# Patient Record
Sex: Male | Born: 1980
Health system: Southern US, Community
[De-identification: ages and names within clinical notes are randomized; demographics above are authoritative.]

## PROBLEM LIST (undated history)

## (undated) DIAGNOSIS — I469 Cardiac arrest, cause unspecified: Secondary | ICD-10-CM

## (undated) DIAGNOSIS — F319 Bipolar disorder, unspecified: Secondary | ICD-10-CM

## (undated) DIAGNOSIS — F32A Depression, unspecified: Secondary | ICD-10-CM

## (undated) DIAGNOSIS — R569 Unspecified convulsions: Secondary | ICD-10-CM

## (undated) DIAGNOSIS — F329 Major depressive disorder, single episode, unspecified: Secondary | ICD-10-CM

## (undated) DIAGNOSIS — G4730 Sleep apnea, unspecified: Secondary | ICD-10-CM

## (undated) DIAGNOSIS — I10 Essential (primary) hypertension: Secondary | ICD-10-CM

## (undated) DIAGNOSIS — F419 Anxiety disorder, unspecified: Secondary | ICD-10-CM

## (undated) DIAGNOSIS — E559 Vitamin D deficiency, unspecified: Secondary | ICD-10-CM

## (undated) DIAGNOSIS — M352 Behcet's disease: Secondary | ICD-10-CM

## (undated) HISTORY — DX: Behcet's disease: M35.2

## (undated) HISTORY — PX: FRACTURE SURGERY: SHX138

## (undated) HISTORY — DX: Bipolar disorder, unspecified: F31.9

## (undated) HISTORY — PX: APPENDECTOMY: SHX54

## (undated) HISTORY — PX: FETAL SURGERY FOR CONGENITAL HERNIA: SHX1618

## (undated) HISTORY — PX: NISSEN FUNDOPLICATION: SHX2091

## (undated) HISTORY — PX: OTHER SURGICAL HISTORY: SHX169

## (undated) HISTORY — DX: Vitamin D deficiency, unspecified: E55.9

---
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
## 1998-05-23 ENCOUNTER — Emergency Department (HOSPITAL_COMMUNITY): Admission: EM | Admit: 1998-05-23 | Discharge: 1998-05-23 | Payer: Self-pay | Admitting: Emergency Medicine

## 1998-07-25 ENCOUNTER — Encounter: Payer: Self-pay | Admitting: Emergency Medicine

## 1998-07-25 ENCOUNTER — Emergency Department (HOSPITAL_COMMUNITY): Admission: EM | Admit: 1998-07-25 | Discharge: 1998-07-25 | Payer: Self-pay | Admitting: Emergency Medicine

## 1998-12-15 ENCOUNTER — Encounter: Payer: Self-pay | Admitting: Emergency Medicine

## 1998-12-15 ENCOUNTER — Emergency Department (HOSPITAL_COMMUNITY): Admission: EM | Admit: 1998-12-15 | Discharge: 1998-12-15 | Payer: Self-pay | Admitting: Emergency Medicine

## 1999-09-19 ENCOUNTER — Emergency Department (HOSPITAL_COMMUNITY): Admission: EM | Admit: 1999-09-19 | Discharge: 1999-09-19 | Payer: Self-pay

## 1999-11-29 ENCOUNTER — Emergency Department (HOSPITAL_COMMUNITY): Admission: EM | Admit: 1999-11-29 | Discharge: 1999-11-29 | Payer: Self-pay | Admitting: Emergency Medicine

## 1999-11-29 ENCOUNTER — Encounter: Payer: Self-pay | Admitting: Emergency Medicine

## 2000-01-05 ENCOUNTER — Encounter: Payer: Self-pay | Admitting: Family Medicine

## 2000-01-05 ENCOUNTER — Encounter: Admission: RE | Admit: 2000-01-05 | Discharge: 2000-01-05 | Payer: Self-pay | Admitting: Family Medicine

## 2000-07-02 ENCOUNTER — Emergency Department (HOSPITAL_COMMUNITY): Admission: EM | Admit: 2000-07-02 | Discharge: 2000-07-02 | Payer: Self-pay

## 2000-07-02 ENCOUNTER — Encounter: Payer: Self-pay | Admitting: Emergency Medicine

## 2000-09-24 ENCOUNTER — Encounter: Payer: Self-pay | Admitting: Emergency Medicine

## 2000-09-24 ENCOUNTER — Emergency Department (HOSPITAL_COMMUNITY): Admission: EM | Admit: 2000-09-24 | Discharge: 2000-09-24 | Payer: Self-pay | Admitting: Emergency Medicine

## 2001-01-22 ENCOUNTER — Emergency Department (HOSPITAL_COMMUNITY): Admission: EM | Admit: 2001-01-22 | Discharge: 2001-01-23 | Payer: Self-pay | Admitting: Emergency Medicine

## 2001-01-22 ENCOUNTER — Encounter: Payer: Self-pay | Admitting: Emergency Medicine

## 2001-03-29 ENCOUNTER — Emergency Department (HOSPITAL_COMMUNITY): Admission: EM | Admit: 2001-03-29 | Discharge: 2001-03-30 | Payer: Self-pay | Admitting: Emergency Medicine

## 2001-05-21 ENCOUNTER — Emergency Department (HOSPITAL_COMMUNITY): Admission: EM | Admit: 2001-05-21 | Discharge: 2001-05-21 | Payer: Self-pay | Admitting: Emergency Medicine

## 2001-08-30 ENCOUNTER — Emergency Department (HOSPITAL_COMMUNITY): Admission: EM | Admit: 2001-08-30 | Discharge: 2001-08-30 | Payer: Self-pay | Admitting: Emergency Medicine

## 2001-09-11 ENCOUNTER — Emergency Department (HOSPITAL_COMMUNITY): Admission: EM | Admit: 2001-09-11 | Discharge: 2001-09-11 | Payer: Self-pay | Admitting: Emergency Medicine

## 2001-09-11 ENCOUNTER — Encounter: Payer: Self-pay | Admitting: Emergency Medicine

## 2001-10-09 ENCOUNTER — Emergency Department (HOSPITAL_COMMUNITY): Admission: EM | Admit: 2001-10-09 | Discharge: 2001-10-09 | Payer: Self-pay | Admitting: Emergency Medicine

## 2001-10-09 ENCOUNTER — Encounter: Payer: Self-pay | Admitting: Emergency Medicine

## 2001-10-28 ENCOUNTER — Emergency Department (HOSPITAL_COMMUNITY): Admission: EM | Admit: 2001-10-28 | Discharge: 2001-10-29 | Payer: Self-pay | Admitting: Emergency Medicine

## 2001-10-29 ENCOUNTER — Encounter: Payer: Self-pay | Admitting: Emergency Medicine

## 2001-12-03 ENCOUNTER — Encounter: Payer: Self-pay | Admitting: Emergency Medicine

## 2001-12-03 ENCOUNTER — Emergency Department (HOSPITAL_COMMUNITY): Admission: EM | Admit: 2001-12-03 | Discharge: 2001-12-03 | Payer: Self-pay | Admitting: Emergency Medicine

## 2002-02-05 ENCOUNTER — Inpatient Hospital Stay (HOSPITAL_COMMUNITY): Admission: RE | Admit: 2002-02-05 | Discharge: 2002-02-07 | Payer: Self-pay | Admitting: Dentistry

## 2002-09-22 ENCOUNTER — Emergency Department (HOSPITAL_COMMUNITY): Admission: EM | Admit: 2002-09-22 | Discharge: 2002-09-22 | Payer: Self-pay | Admitting: Emergency Medicine

## 2003-02-02 ENCOUNTER — Emergency Department (HOSPITAL_COMMUNITY): Admission: EM | Admit: 2003-02-02 | Discharge: 2003-02-02 | Payer: Self-pay | Admitting: Emergency Medicine

## 2003-08-09 ENCOUNTER — Emergency Department (HOSPITAL_COMMUNITY): Admission: EM | Admit: 2003-08-09 | Discharge: 2003-08-10 | Payer: Self-pay | Admitting: Emergency Medicine

## 2004-01-07 ENCOUNTER — Emergency Department (HOSPITAL_COMMUNITY): Admission: EM | Admit: 2004-01-07 | Discharge: 2004-01-07 | Payer: Self-pay | Admitting: Emergency Medicine

## 2004-03-08 ENCOUNTER — Ambulatory Visit (HOSPITAL_COMMUNITY): Admission: RE | Admit: 2004-03-08 | Discharge: 2004-03-08 | Payer: Self-pay | Admitting: Internal Medicine

## 2004-03-16 ENCOUNTER — Emergency Department (HOSPITAL_COMMUNITY): Admission: EM | Admit: 2004-03-16 | Discharge: 2004-03-16 | Payer: Self-pay | Admitting: Emergency Medicine

## 2004-07-15 ENCOUNTER — Emergency Department (HOSPITAL_COMMUNITY): Admission: EM | Admit: 2004-07-15 | Discharge: 2004-07-15 | Payer: Self-pay | Admitting: Emergency Medicine

## 2004-09-26 ENCOUNTER — Emergency Department (HOSPITAL_COMMUNITY): Admission: EM | Admit: 2004-09-26 | Discharge: 2004-09-26 | Payer: Self-pay | Admitting: Emergency Medicine

## 2004-09-29 ENCOUNTER — Encounter (HOSPITAL_COMMUNITY): Admission: RE | Admit: 2004-09-29 | Discharge: 2004-12-13 | Payer: Self-pay | Admitting: Emergency Medicine

## 2004-10-04 ENCOUNTER — Emergency Department (HOSPITAL_COMMUNITY): Admission: EM | Admit: 2004-10-04 | Discharge: 2004-10-04 | Payer: Self-pay | Admitting: Emergency Medicine

## 2004-10-05 ENCOUNTER — Inpatient Hospital Stay (HOSPITAL_COMMUNITY): Admission: EM | Admit: 2004-10-05 | Discharge: 2004-10-10 | Payer: Self-pay | Admitting: Emergency Medicine

## 2004-10-05 ENCOUNTER — Ambulatory Visit: Payer: Self-pay | Admitting: Infectious Diseases

## 2005-02-04 ENCOUNTER — Emergency Department (HOSPITAL_COMMUNITY): Admission: EM | Admit: 2005-02-04 | Discharge: 2005-02-04 | Payer: Self-pay | Admitting: Emergency Medicine

## 2005-02-21 ENCOUNTER — Emergency Department (HOSPITAL_COMMUNITY): Admission: EM | Admit: 2005-02-21 | Discharge: 2005-02-22 | Payer: Self-pay | Admitting: Emergency Medicine

## 2005-04-09 ENCOUNTER — Emergency Department (HOSPITAL_COMMUNITY): Admission: EM | Admit: 2005-04-09 | Discharge: 2005-04-10 | Payer: Self-pay | Admitting: Emergency Medicine

## 2005-06-13 ENCOUNTER — Emergency Department (HOSPITAL_COMMUNITY): Admission: EM | Admit: 2005-06-13 | Discharge: 2005-06-14 | Payer: Self-pay | Admitting: Emergency Medicine

## 2005-06-16 ENCOUNTER — Emergency Department (HOSPITAL_COMMUNITY): Admission: EM | Admit: 2005-06-16 | Discharge: 2005-06-16 | Payer: Self-pay | Admitting: Emergency Medicine

## 2006-05-06 ENCOUNTER — Emergency Department (HOSPITAL_COMMUNITY): Admission: EM | Admit: 2006-05-06 | Discharge: 2006-05-06 | Payer: Self-pay | Admitting: Emergency Medicine

## 2006-06-15 ENCOUNTER — Ambulatory Visit (HOSPITAL_COMMUNITY): Admission: RE | Admit: 2006-06-15 | Discharge: 2006-06-15 | Payer: Self-pay | Admitting: Urology

## 2006-08-02 ENCOUNTER — Emergency Department (HOSPITAL_COMMUNITY): Admission: EM | Admit: 2006-08-02 | Discharge: 2006-08-02 | Payer: Self-pay | Admitting: Emergency Medicine

## 2007-01-03 ENCOUNTER — Emergency Department (HOSPITAL_COMMUNITY): Admission: EM | Admit: 2007-01-03 | Discharge: 2007-01-04 | Payer: Self-pay | Admitting: *Deleted

## 2007-03-02 ENCOUNTER — Emergency Department (HOSPITAL_COMMUNITY): Admission: EM | Admit: 2007-03-02 | Discharge: 2007-03-02 | Payer: Self-pay | Admitting: *Deleted

## 2007-03-18 ENCOUNTER — Emergency Department (HOSPITAL_COMMUNITY): Admission: EM | Admit: 2007-03-18 | Discharge: 2007-03-18 | Payer: Self-pay | Admitting: Emergency Medicine

## 2007-03-19 ENCOUNTER — Emergency Department (HOSPITAL_COMMUNITY): Admission: EM | Admit: 2007-03-19 | Discharge: 2007-03-20 | Payer: Self-pay | Admitting: *Deleted

## 2007-04-18 ENCOUNTER — Ambulatory Visit: Payer: Self-pay | Admitting: Psychiatry

## 2007-04-18 ENCOUNTER — Other Ambulatory Visit (HOSPITAL_COMMUNITY): Admission: RE | Admit: 2007-04-18 | Discharge: 2007-05-10 | Payer: Self-pay | Admitting: Psychiatry

## 2007-04-26 ENCOUNTER — Emergency Department (HOSPITAL_COMMUNITY): Admission: EM | Admit: 2007-04-26 | Discharge: 2007-04-26 | Payer: Self-pay | Admitting: Emergency Medicine

## 2007-05-21 ENCOUNTER — Encounter: Admission: RE | Admit: 2007-05-21 | Discharge: 2007-05-21 | Payer: Self-pay | Admitting: Neurology

## 2007-08-25 ENCOUNTER — Emergency Department (HOSPITAL_COMMUNITY): Admission: EM | Admit: 2007-08-25 | Discharge: 2007-08-26 | Payer: Self-pay | Admitting: Emergency Medicine

## 2007-11-19 ENCOUNTER — Ambulatory Visit: Payer: Self-pay | Admitting: Cardiovascular Disease

## 2007-11-19 ENCOUNTER — Inpatient Hospital Stay (HOSPITAL_COMMUNITY): Admission: EM | Admit: 2007-11-19 | Discharge: 2007-11-21 | Payer: Self-pay | Admitting: Emergency Medicine

## 2007-11-20 ENCOUNTER — Encounter (INDEPENDENT_AMBULATORY_CARE_PROVIDER_SITE_OTHER): Payer: Self-pay | Admitting: Internal Medicine

## 2007-11-21 ENCOUNTER — Encounter: Payer: Self-pay | Admitting: Cardiology

## 2007-11-26 ENCOUNTER — Emergency Department (HOSPITAL_COMMUNITY): Admission: EM | Admit: 2007-11-26 | Discharge: 2007-11-27 | Payer: Self-pay | Admitting: Emergency Medicine

## 2008-01-04 ENCOUNTER — Inpatient Hospital Stay (HOSPITAL_COMMUNITY): Admission: EM | Admit: 2008-01-04 | Discharge: 2008-01-06 | Payer: Self-pay | Admitting: Emergency Medicine

## 2008-01-06 ENCOUNTER — Inpatient Hospital Stay (HOSPITAL_COMMUNITY): Admission: RE | Admit: 2008-01-06 | Discharge: 2008-01-09 | Payer: Self-pay | Admitting: *Deleted

## 2008-01-06 ENCOUNTER — Ambulatory Visit: Payer: Self-pay | Admitting: *Deleted

## 2008-01-15 ENCOUNTER — Ambulatory Visit: Payer: Self-pay | Admitting: Internal Medicine

## 2008-01-27 ENCOUNTER — Encounter: Payer: Self-pay | Admitting: Internal Medicine

## 2008-03-20 ENCOUNTER — Encounter: Admission: RE | Admit: 2008-03-20 | Discharge: 2008-03-20 | Payer: Self-pay | Admitting: Family Medicine

## 2008-04-28 ENCOUNTER — Observation Stay (HOSPITAL_COMMUNITY): Admission: EM | Admit: 2008-04-28 | Discharge: 2008-04-29 | Payer: Self-pay | Admitting: Emergency Medicine

## 2008-06-18 DIAGNOSIS — I10 Essential (primary) hypertension: Secondary | ICD-10-CM | POA: Insufficient documentation

## 2008-06-18 DIAGNOSIS — G40909 Epilepsy, unspecified, not intractable, without status epilepticus: Secondary | ICD-10-CM

## 2008-06-18 DIAGNOSIS — R55 Syncope and collapse: Secondary | ICD-10-CM

## 2008-06-18 DIAGNOSIS — F319 Bipolar disorder, unspecified: Secondary | ICD-10-CM | POA: Insufficient documentation

## 2008-06-19 ENCOUNTER — Ambulatory Visit: Payer: Self-pay | Admitting: Internal Medicine

## 2008-06-19 ENCOUNTER — Encounter: Payer: Self-pay | Admitting: Internal Medicine

## 2008-09-02 ENCOUNTER — Telehealth (INDEPENDENT_AMBULATORY_CARE_PROVIDER_SITE_OTHER): Payer: Self-pay | Admitting: *Deleted

## 2008-09-21 ENCOUNTER — Telehealth: Payer: Self-pay | Admitting: Internal Medicine

## 2008-10-22 ENCOUNTER — Encounter (INDEPENDENT_AMBULATORY_CARE_PROVIDER_SITE_OTHER): Payer: Self-pay | Admitting: *Deleted

## 2009-03-04 ENCOUNTER — Emergency Department (HOSPITAL_COMMUNITY): Admission: EM | Admit: 2009-03-04 | Discharge: 2009-03-04 | Payer: Self-pay | Admitting: Emergency Medicine

## 2009-09-04 ENCOUNTER — Emergency Department (HOSPITAL_COMMUNITY): Admission: EM | Admit: 2009-09-04 | Discharge: 2009-09-04 | Payer: Self-pay | Admitting: Emergency Medicine

## 2010-03-31 ENCOUNTER — Emergency Department (HOSPITAL_COMMUNITY)
Admission: EM | Admit: 2010-03-31 | Discharge: 2010-04-01 | Payer: Self-pay | Source: Home / Self Care | Admitting: Emergency Medicine

## 2010-07-05 LAB — WOUND CULTURE

## 2010-07-18 LAB — URINALYSIS, ROUTINE W REFLEX MICROSCOPIC
Bilirubin Urine: NEGATIVE
Nitrite: NEGATIVE
Protein, ur: NEGATIVE mg/dL
Specific Gravity, Urine: 1.024 (ref 1.005–1.030)
Urobilinogen, UA: 0.2 mg/dL (ref 0.0–1.0)
pH: 5.5 (ref 5.0–8.0)

## 2010-07-18 LAB — CBC
Hemoglobin: 14.3 g/dL (ref 13.0–17.0)
Hemoglobin: 16.4 g/dL (ref 13.0–17.0)
MCHC: 33.6 g/dL (ref 30.0–36.0)
MCV: 91.1 fL (ref 78.0–100.0)
RBC: 4.47 MIL/uL (ref 4.22–5.81)
RDW: 12 % (ref 11.5–15.5)
WBC: 12.8 10*3/uL — ABNORMAL HIGH (ref 4.0–10.5)
WBC: 8 10*3/uL (ref 4.0–10.5)

## 2010-07-18 LAB — HEPATIC FUNCTION PANEL
Albumin: 3.8 g/dL (ref 3.5–5.2)
Alkaline Phosphatase: 80 U/L (ref 39–117)
Bilirubin, Direct: 0.2 mg/dL (ref 0.0–0.3)
Indirect Bilirubin: 0.9 mg/dL (ref 0.3–0.9)
Total Bilirubin: 1.1 mg/dL (ref 0.3–1.2)

## 2010-07-18 LAB — DRUGS OF ABUSE SCREEN W/O ALC, ROUTINE URINE
Amphetamine Screen, Ur: NEGATIVE
Barbiturate Quant, Ur: NEGATIVE
Cocaine Metabolites: NEGATIVE
Creatinine,U: 191.4 mg/dL
Opiate Screen, Urine: POSITIVE — AB
Phencyclidine (PCP): NEGATIVE
Propoxyphene: NEGATIVE

## 2010-07-18 LAB — BASIC METABOLIC PANEL
CO2: 25 mEq/L (ref 19–32)
Calcium: 8.6 mg/dL (ref 8.4–10.5)
Calcium: 9.5 mg/dL (ref 8.4–10.5)
Chloride: 108 mEq/L (ref 96–112)
Creatinine, Ser: 1.04 mg/dL (ref 0.4–1.5)
Creatinine, Ser: 1.12 mg/dL (ref 0.4–1.5)
GFR calc Af Amer: >60 mL/min (ref >60)
GFR calc Af Amer: >60 mL/min (ref >60)
GFR calc non Af Amer: >60 mL/min (ref >60)
Glucose, Bld: 94 mg/dL (ref 70–99)
Sodium: 141 mEq/L (ref 135–145)
Sodium: 142 mEq/L (ref 135–145)

## 2010-07-18 LAB — CARDIAC PANEL(CRET KIN+CKTOT+MB+TROPI)
CK, MB: 1.2 ng/mL (ref 0.3–4.0)
Relative Index: INVALID (ref 0.0–2.5)
Total CK: 89 U/L (ref 7–232)
Troponin I: <0.01 ng/mL (ref 0.00–0.06)

## 2010-07-18 LAB — VITAMIN B12: Vitamin B-12: 237 pg/mL (ref 211–911)

## 2010-07-18 LAB — OPIATE, QUANTITATIVE, URINE
Codeine Urine: NEGATIVE ng/mL
Hydrocodone: NEGATIVE ng/mL
Oxycodone, ur: NEGATIVE ng/mL

## 2010-07-18 LAB — DIFFERENTIAL
Eosinophils Absolute: 0.1 10*3/uL (ref 0.0–0.7)
Eosinophils Relative: 1 % (ref 0–5)
Lymphs Abs: 1.6 10*3/uL (ref 0.7–4.0)
Monocytes Absolute: 0.7 10*3/uL (ref 0.1–1.0)
Monocytes Relative: 9 % (ref 3–12)
Neutro Abs: 5.6 10*3/uL (ref 1.7–7.7)
Neutrophils Relative %: 70 % (ref 43–77)

## 2010-07-18 LAB — CK TOTAL AND CKMB (NOT AT ARMC)
Relative Index: INVALID (ref 0.0–2.5)
Total CK: 90 U/L (ref 7–232)

## 2010-07-18 LAB — URINE MICROSCOPIC-ADD ON

## 2010-07-18 LAB — TROPONIN I: Troponin I: <0.01 ng/mL (ref 0.00–0.06)

## 2010-07-18 LAB — URINE CULTURE: Colony Count: >=100000

## 2010-07-18 LAB — PROTIME-INR: Prothrombin Time: 13.8 seconds (ref 11.6–15.2)

## 2010-07-18 LAB — FOLATE RBC: RBC Folate: 881 ng/mL — ABNORMAL HIGH (ref 180–600)

## 2010-07-18 LAB — ETHANOL: Alcohol, Ethyl (B): <5 mg/dL (ref 0–10)

## 2010-07-18 LAB — MAGNESIUM: Magnesium: 2.3 mg/dL (ref 1.5–2.5)

## 2010-08-16 NOTE — Discharge Summary (Signed)
[name] [name], [name] [name]                  ACCOUNT NO.:  [id number]   MEDICAL RECORD NO.:  [id number]          PATIENT TYPE:  INP   LOCATION:  4714                         FACILITY:  [facility]   PHYSICIAN:  Lonia Blood, M.D.       DATE OF BIRTH:  05-25-1980   DATE OF ADMISSION:  01/04/2008  DATE OF DISCHARGE:  01/06/2008                               DISCHARGE SUMMARY   PRIMARY CARE PHYSICIAN:  This patient goes to Dr. Evelene Croon, local  psychiatrist.   DISCHARGE DIAGNOSES:  1. Bipolar disorder in depressive phase with suicide attempt.  2. Suicide attempt by drinking NyQuil.  3. Seizure disorder.  4. Hypokalemia, resolved.  5. History of asthma.   DISCHARGE MEDICATIONS:  1. Cymbalta 30 mg by mouth daily.  2. Topamax 300 mg by mouth daily.  3. Klonopin 0.5 mg by mouth twice a day.  4. Protonix 40 mg daily.  5. Remeron 45 mg at bedtime.  6. Trazodone 50 mg at bedtime.   CONDITION ON DISCHARGE:  [name] [name] is to be transferred to [facility] for inpatient psychiatric treatment on a voluntary basis.   PROCEDURE DURING THIS ADMISSION:  No procedures obtained.   CONSULTATION DURING THIS ADMISSION:  This patient was seen by Dr.  Milford Cage from Psychiatry.   HISTORY AND PHYSICAL:  Refer to dictated H&P done by Dr. Flonnie Overman on  January 04, 2008.   HOSPITAL COURSE:  Overdose.  [name] [name] was admitted from the emergency  room with a reported ingestion of NyQuil.  The exact moment of ingestion  was a little bit unclear since the family found the patient passed out  with the bottle next to him.  In the emergency room, a stat  acetaminophen level was drawn at 12:20 p.m. on January 04, 2008,  indicating a level of 102.6.  Followup acetaminophen level at 1540 was  down to 63.5.  The decision was made in the emergency room to initiate N-  Acetyl Cysteine intravenous drip which was continued for 48 hours in the  hospital.  The patient had close monitoring of his liver enzymes and  PT/INR with  complete normal  levels being documented on January 06, 2008.  [name] [name] has been evaluated  by Dr. Milford Cage from Psychiatry who felt that the patient's suicide  gesture was severe enough to warrant inpatient hospitalization to her  facility.  For this reason, [name] [name] is referred for transfer to [facility] today, January 06, 2008.      Lonia Blood, M.D.  Electronically Signed     SL/MEDQ  D:  01/06/2008  T:  01/06/2008  Job:  [id number]
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
## 2010-08-16 NOTE — Consult Note (Signed)
NAMESHANTI, EICHEL                  ACCOUNT NO.:  [id number]   MEDICAL RECORD NO.:  [id number]          PATIENT TYPE:  INP   LOCATION:  4714                         FACILITY:  [facility]   PHYSICIAN:  Jasmine Pang, M.D. DATE OF BIRTH:  06-Apr-1980   DATE OF CONSULTATION:  01/05/2008  DATE OF DISCHARGE:                                 CONSULTATION   IDENTIFICATION:  This is a 30 year old single white male from  [place], [place].   HISTORY OF PRESENT ILLNESS:  The patient was admitted after a suicide  attempt with NyQuil (entire bottle).  He also has a history of seizure  disorder and hypertension.  The patient admits to depression and states  he deliberately overdosed with a bottle of NyQuil, I was hoping I would  die.  He states he is not currently suicidal, and is ashamed for how he  has hurt his family.  The patient sees Dr. Evelene Croon for bipolar disorder  since December 2006.  He also sees therapist, Arbutus Ped, for  treatment.  He is on Cymbalta 30 mg daily, Abilify 4 mg daily, trazodone  50 mg at bedtime, Remeron 45 mg at bedtime, Klonopin 0.5 mg daily.  Dr.  Evelene Croon had recently added the Abilify.  He states he is not compliant with  his medications.  He has multiple stressors, including being disabled  due to depression and seizures.  He also, as a result, cannot work.  He  cannot drive due to his seizures.  He is on a lot of medications.  He  lives with his mother and father, and mother is disabled.  Father is  sick.  He has had no previous psychiatric inpatient.  He was in the  [facility] intensive outpatient program several months ago.  He denies  any history of drug or alcohol use.   MENTAL STATUS EXAM:  The patient was lying in bed, but friendly and  cooperative with good eye contact.  Speech was normal rate and flow.  Psychomotor activity was somewhat decreased due to being in bed.  Mood  was depressed, anxious, but less so than when he took the overdose.  Affect was  consistent with mood, somewhat constricted.  He denied any  current suicidal ideation, but admits that he has been hoping he would  die when he took the overdose of NyQuil.  There was no homicidal  ideation.  No thoughts of self-injurious behavior.  No auditory or  visual hallucinations.  No paranoia or delusions.  Thoughts were logical  and goal-directed.  Thought content, no dominant theme.  Cognitive was  grossly intact.  Insight was fair, judgment fair.  Impulse control was  fair.   ASSESSMENT:  1. Bipolar disorder, depressed mood.  2. Multiple medical problems.  3. Serious suicide attempt prior to admission with desire to die.   RECOMMENDATIONS:  1. The patient was in agreement with transitioning to the [facility] after he was medically stable.  He stated      that he would be willing to sign  himself in for several days for      further med stabilization.  2. Continue current psychiatric medicines as they are at this point.  3. Case management can help with the transfer to [facility] when he is medically stable.   Thank you very much for this consult with this very nice young man.  If  you have any questions or concerns, please feel free to contact me at  [phone].      Jasmine Pang, M.D.  Electronically Signed     BHS/MEDQ  D:  01/05/2008  T:  01/05/2008  Job:  [id number]

## 2010-08-16 NOTE — H&P (Signed)
[name] [name], [name] [name]                  ACCOUNT NO.:  [id number]   MEDICAL RECORD NO.:  [id number]          PATIENT TYPE:  IPS   LOCATION:  0306                          FACILITY:  [facility]   PHYSICIAN:  Jasmine Pang, M.D. DATE OF BIRTH:  20-Sep-1980   DATE OF ADMISSION:  01/06/2008  DATE OF DISCHARGE:                       PSYCHIATRIC ADMISSION ASSESSMENT   This is a 30 year old male voluntarily admitted on January 06, 2008.   HISTORY OF PRESENT ILLNESS:  The patient reports a history of  intentional overdose on NyQuil, taking whole bottle.  Was found by  family.  He is a transfer from [facility] after being hospitalized for  3 days after the overdose.  The patient reports the overdose is due to  him feeling very depressed.  He has been unable to drive due to his  seizures.  Just feeling very, very overwhelmed.  States he was  impulsive.  He denies any alcohol or drug use and has been taking his  medications here and there due to missing his morning medications  because of his sleepiness, having problems with vomiting from his  headaches, and it is unclear whether to resume a medicine later on in  the day.  Also having some financial difficulties with affording his  medications.  He sleeps well with his medications.  He reports they make  him feel drowsy.  His appetite has been good.   PAST PSYCHIATRIC HISTORY:  First admission to [facility]  was in the IOP in January.  He sees Dr. Lafayette Dragon for outpatient mental  health services.  Has a therapist named Arbutus Ped.   SOCIAL HISTORY:  The patient lives with his parents.  He lives in  [place].  He is a 30 year old single male.  Recently was placed on  disability.   FAMILY HISTORY:  None.   ALCOHOL AND DRUG HISTORY.:  No alcohol or drug use.   Primary care Xzavior Reinig is Dr. Anne Hahn at [facility] and has Dr.  Clarisse Gouge as his primary care [facility].   MEDICAL PROBLEMS:  History of seizures for the past 3 years, migraine  headaches, and hypertension.   MEDICATIONS LISTED:  1. Abilify 4 mg daily.  2. Trazodone 50 mg at bedtime.  3. Remeron 45 mg at h.s.  4. Klonopin 0.5 mg b.i.d.  5. Cymbalta 30 mg daily.  6. Atenolol 50 mg daily.  7. Topamax 300 mg daily.  8. Migranal p.r.n. for headaches.  9. Vicodin q.6 h. p.r.n. for headaches.   DRUG ALLERGIES:  No known allergies.   PHYSICAL EXAM:  GENERAL:  This is a young male.  He appears well-  nourished.  Assessed at [facility] while he was hospitalized for his  overdose.  His acetaminophen level at the time was 102.6, down to 63.5,  glucose was 100.  PT was within normal range at 14.9, INR 1.1.  CBC  within normal limits.  Magnesium of 2.  Ammonia level was 8.   MENTAL STATUS EXAM:  The patient today is fully alert, casually dressed,  good eye contact.  Speech is clear, normal pace and  tone.  The patient's  mood is euthymic.  The patient also is pleasant, agreeable to  recommendations and obtaining a family session.  Cognitive function  intact.  His memory appears to be good.  Judgment and insight is fair.  He denies any suicidal thoughts.  No delusional statements.   IMPRESSION:  Axis I:  Bipolar disorder, depressed state.  Axis II:  Deferred.  Axis III:  Seizure disorder, hypertension, and headaches.  Axis IV:  Problems with occupation, unable to work due to his seizure  activity, economic problems, difficulty affording medications and  copays, medical problems preventing the patient to work and having  transportation.  Axis V:  Current is 40.   PLAN:  We will put patient on seizure precautions.  We will continue  with the patient's medications, have a family session with his parents.  Case manager will assess any followup, as patient is wanting outpatient  mental health, which would be more affordable.  Tentative length of stay  is 2 to 3 days.      Landry Corporal, N.P.      Jasmine Pang, M.D.  Electronically Signed    JO/MEDQ  D:   01/07/2008  T:  01/07/2008  Job:  [id number]

## 2010-08-16 NOTE — Procedures (Signed)
EEG:  01-96   CLINICAL HISTORY:  The patient is a 30 year old admitted with altered  mental status, syncope versus seizures.  He had a history of seizures,  bipolar affective disorder, depression, and hypertension.  He was found  on the ground next to his car.  He does not remember anything about the  episode (780.02, 293.0).   PROCEDURE:  The tracing is carried out on a 32-channel digital Cadwell  recorder reformatted into 16 channel montages with one devoted to EKG.  The patient was awake during the recording and briefly drowsy.  The  [employer] 10/20 system lead placement was used.  Medications include  Cymbalta, Lamictal, Klonopin, atenolol, Risperdal, Remeron, Protonix,  Zofran, Lovenox, and Tylenol.   DESCRIPTION OF FINDINGS:  Dominant frequency is 10-11 Hz, 15-20  microvolt activity that is well regulated.  The patient becomes briefly  drowsy with mixed frequency of theta and upper delta range activity of  15-30 microvolts.   Photic stimulation failed to induce a change in background.  Hyperventilation was not carried out.   EKG showed regular sinus rhythm with ventricular response of 84 beats  per minute.   IMPRESSION:  Normal record with the patient awake and briefly drowsy.      Deanna Artis. Sharene Skeans, M.D.  Electronically Signed     ZOX:WRUE  D:  04/28/2008 12:23:41  T:  04/29/2008 02:32:09  Job #:  [id number]   cc:   Dr. Janee Morn

## 2010-08-16 NOTE — Discharge Summary (Signed)
[name] [name], [name] [name]                  ACCOUNT NO.:  [id number]   MEDICAL RECORD NO.:  [id number]          PATIENT TYPE:  IPS   LOCATION:  0306                          [facility]ACILITY[facility]  [facility]   PHYSICIAN:  Jasmine Pang, M.D. DATE OF BIRTH:  Apr 07, 1980   DATE OF ADMISSION:  01/06/2008  DATE OF DISCHARGE:  01/09/2008                               DISCHARGE SUMMARY   IDENTIFICATION:  This is a 30 year old single white male who was  admitted on a voluntary basis on January 06, 2008.   HISTORY OF PRESENT ILLNESS:  The patient reports a history of  intentional overdose on NyQuil taking the whole bottle.  He was found by  family.  He is transfer from [facility] after being  hospitalized for 3 days after the overdose.  The patient reports the  overdose due to him feeling depressed.  He has been unable to drive due  to his seizures.  He has been feeling very very overwhelmed.  He states  he was impulsive.  He denies any alcohol or drug use has been taking his  medications here and there due to missing his morning medications  because of sleepiness and having problems of vomiting from his  headaches.  He is also having some financial difficulties with affording  his medications.  He sleeps well with his medications.  He reports they  make him feel drowsy.  His appetite has been good.   PAST PSYCHIATRIC HISTORY:  The first admission to [facility]  was in IOP in January 2009, and he sees Dr. Evelene Croon for outpatient mental  health services.  He has a therapist named Arbutus Ped.   FAMILY HISTORY:  The patient denies.   ALCOHOL AND DRUG HISTORY:  No alcohol or drug use.   MEDICAL PROBLEMS:  History of seizures for the past 3 years, migraine  headaches, and hypertension.   MEDICATIONS:  1. Abilify 4 mg daily.  2. Trazodone 50 mg at bedtime.  3. Remeron 45 mg at bedtime.  4. Klonopin 0.5 mg p.o. b.i.d.  5. Cymbalta 30 mg daily.  6. Atenolol 50 mg daily.  7. Topamax 300 mg  daily.  8. Migranal p.r.n. for headaches.  9. Vicodin q.6 hours p.r.n. for headaches.   DRUG ALLERGIES:  No known drug allergies.   PHYSICAL FINDINGS:  The patient was assessed at [facility] when he was  hospitalized for his overdose.  There were no acute physical or medical  problems noted.   ADMISSION LABORATORIES:  Acetaminophen level at that time was 102.6 and  then went down to 63.5.  Glucose was 100.  PT was within normal range at  14.9.  INR was 1.1.  CBC was within normal limits.  Magnesium was 2.  Ammonia level was 8.   HOSPITAL COURSE:  Upon admission, the patient was continued on Vicodin  5/325 mg 2 tablets every 6 hours for headache, Remeron 45 mg p.o.  q.h.s., trazodone 50 mg q.h.s. p.r.n. insomnia, Cymbalta 30 mg p.o.  daily, Topamax 300 mg p.o. q.h.s., Klonopin 0.5 mg daily p.r.n. anxiety,  and atenolol 50 mg daily.  The patient tolerated these medications well  with no significant side effects.  He had been on Abilify previously,  but did not want to restart this.  He states it made him feel worse.  Instead, he was started on Risperdal 1 mg p.o. daily.  He tolerated this  well without side effects.  In individual sessions with me, the patient  was very friendly, polite, and cooperative.  He discussed his multiple  stressors including being on disability, being unable to work, and  unable to drive.  He stated he wanted a normal life where he would be  married and have children.  On January 08, 2008, the patient and the  patient's mother had a family session.  The patient's mother was very  supportive.  Mother reports that his seizures are pretty horrific and  then last about 3-4 minutes each time.  That have been scary for him in  the biggest fear is to fall and get hurt or have a seizure while  driving.  The patient reports at that moment in time, he had just had  enough of his life and disability compounded by not being compliant with  medications.  He states that  was thinking when he took the overdose.  The mother reports that she would set down with the patient each week  and help him set up his pill box to ensure compliance.  The patient was  less depressed and less anxious on January 08, 2008.  He again was having  no side effects to his medications.  On January 09, 2008, mental status  had improved markedly from admission status.  Sleep was good.  Appetite  was good.  Mood was euthymic.  Affect consistent with mood.  There was  no suicidal or homicidal ideation.  No thoughts of self-injurious  behavior.  No auditory or visual hallucinations.  No paranoia or  delusions.  Thoughts were logical and goal-directed, thought content.  No predominant theme.  Cognitive was intact and insight good, judgment  good, impulse control was good.   DISCHARGE DIAGNOSES:  Axis I.  Bipolar disorder, depressed mood, severe  without psychosis.  Axis II:  None.  Axis III:  Seizure disorder, hypertension, and migraine headaches.  Axis IV:  Severe (problems with occupation, inability to work due to his  seizure activity, economic problems, difficulty affording medications in  copays, medical problems preventing the patient from working and having  transportation).  Axis V:  Global assessment of functioning was 55 upon discharge.  GAF  was 40 upon admission.  GAF highest past year was 65.   DISCHARGE PLAN:  There was no specific activity level or dietary  restrictions.   POSTHOSPITAL CARE PLANS:  The patient will be seen at [facility] on January 14, 2008, at 3:30 p.m.  At this point, he  will meet with the therapist.  He is willing to be assigned to  psychiatrist.   DISCHARGE MEDICATIONS:  1. Risperdal 1 mg daily.  2. Topamax 300 mg at bedtime.  3. Klonopin 0.5 mg daily.  4. Remeron 45 mg at bedtime.  5. Trazodone 50 mg at bedtime if needed for insomnia.  6. Atenolol 50 mg daily.  7. Cymbalta 30 mg daily.      Jasmine Pang, M.D.   Electronically Signed     BHS/MEDQ  D:  01/09/2008  T:  01/10/2008  Job:  [id number]

## 2010-08-16 NOTE — H&P (Signed)
[name] [name], [name] [name]                  ACCOUNT NO.:  [id number]   MEDICAL RECORD NO.:  [id number]          PATIENT TYPE:  INP   LOCATION:  1419                         FACILITY:  [facility]   PHYSICIAN:  Peggye Pitt, M.D. DATE OF BIRTH:  19-Jan-1981   DATE OF ADMISSION:  11/19/2007  DATE OF DISCHARGE:                              HISTORY & PHYSICAL   PRIMARY CARE PHYSICIAN:  None.   NEUROLOGIST:  Marlan Palau, MD, [facility].   CHIEF COMPLAINT:  Chest pain question seizure activity.   HISTORY OF PRESENT ILLNESS:  Mr. Kolar is a 30 year old white man with  history of seizure disorder who comes in today after a syncopal episode.  He was doing yard work at home when he suddenly had severe substernal  chest pain radiating to his jaw and left shoulder with severe chest  fluttering and palpitations.  He then passed out.  Next thing he  remembers is waking up in the ambulance.  A 75 year old nephew witnessed  the event and thought it might have been a seizure.  He currently has  had no further chest pain.  He denies any further symptoms like fever,  chills, cough, or abdominal pain.   ALLERGIES:  No known drug allergies.   PAST MEDICAL HISTORY:  1. Seizure disorder.  2. Depression.  3. Hypertension.  4. Migraines.   HOME MEDICATIONS:  1. Cymbalta 30 mg p.o. daily.  2. Topamax 25 mg p.o. b.i.d.  3. Mirtazapine 45 mg p.o. daily.  4. Atenolol 50 mg p.o. daily.  5. Trazodone 50 mg p.o. at bedtime.   SOCIAL HISTORY:  He is single.  He is disabled secondary to his seizure  disorder.  Lives with his mother.  Completed a high school education.  He denies any tobacco, alcohol, or illicit drug use.   His family history is noncontributory.   REVIEW OF SYSTEMS:  A 10-system review of systems was negative except as  per HPI.   PHYSICAL EXAM:  VITAL SIGNS:  Upon admission show a blood pressure of  137/83, heart rate 93, respirations 19, O2 sat 98% on room air with a  temperature  of 97.7.  GENERAL:  He is alert, awake, and oriented x3, in no acute distress.  HEENT:  Normocephalic and atraumatic.  His pupils are equal, reactive to  light and accommodation with intact extraocular movement.  NECK:  Supple with no JVD.  No lymphadenopathy.  No bruits or goiter.  CARDIOVASCULAR:  Regular rate and rhythm with no murmurs, rubs, or  gallops auscultated.  LUNGS:  Clear to auscultation bilaterally.  ABDOMEN:  Soft, nontender, and nondistended with positive bowel sounds.  EXTREMITIES:  He has no edema and positive pedal pulses.  NEUROLOGICAL:  His mental status is intact.  His cranial nerves II  through XII are intact.  His muscle strength is 5/5 bilateral and  symmetric.  His deep tendon reflexes are 2+, symmetric.  His finger-to-  nose is normal.  His Babinski is downgoing.   LABORATORY DATA:  Upon admission shows sodium of 138, potassium 3.4,  chloride 104, bicarb 26,  BUN 14, creatinine 1.20, and glucose of 107.  His total bili is 0.8, alk phos 62, AST 14, ALT 21, total protein 6.3,  albumin of 3.8, and a calcium of 8.8.  WBC 6.9, hemoglobin 15.1, and  platelets 158.  Alcohol level less than 5.  UDS is negative.  UA is  negative.  A first set of cardiac markers is negative.   A chest x-ray shows no acute disease.  A CT head has no acute findings  and no mass lesions.  An EKG shows normal sinus rhythm with a rate of 84  and normal axis, some left atrial enlargement, but no acute ST-T wave  changes.   ASSESSMENT AND PLAN:  1. Syncope.  I wonder if all of his seizures are actually secondary      to some type of arrhythmia.  I will admit to telemetry, cycle his      EKGs and cardiac enzymes.  I have consulted Dr. Gala Romney with      [facility], and he will have EP see him in the morning or in the      office.  There is no delta wave on EKG to suggest WPW.  He will      like me to defer a 2-D echo to be performed in his office.  We will      check a TSH and a fasting  lipid panel in the morning to further      risk-stratify.  Plan will be to admit for 23-hour observation.  If      he has no change in his cardiac enzymes and no arrhythmias, we will      discharge home with follow up with Cardiology for a possible event      monitor or loop monitor.  2. Hypertension.  We will continue his atenolol.  3. For his psych issues, we will continue his home meds.  4. Prophylaxis.  While in the hospital, we placed on Protonix and      SCDs.      Peggye Pitt, M.D.  Electronically Signed     EH/MEDQ  D:  11/19/2007  T:  11/20/2007  Job:  [id number]

## 2010-08-16 NOTE — Discharge Summary (Signed)
[name] [name], [name] [name]                  ACCOUNT NO.:  [id number]   MEDICAL RECORD NO.:  [id number]          PATIENT TYPE:  INP   LOCATION:  1419                         [facility]ACILITY:  [facility]   PHYSICIAN:  Isidor Holts, M.D.  DATE OF BIRTH:  1981-01-13   DATE OF ADMISSION:  11/19/2007  DATE OF DISCHARGE:  11/21/2007                               DISCHARGE SUMMARY   PMD:  Unassigned.   PRIMARY NEUROLOGIST:  Lesia Sago, MD, [facility].   DISCHARGE DIAGNOSES:  1. Syncopal episode.  2. Chest pain.  3. Seizure disorder.  4. Hypertension.  5. Bronchial asthma.   DISCHARGE MEDICATIONS:  1. Cymbalta 30 mg p.o. daily.  2. Topamax 300 mg p.o. nightly.  3. Abilify 4-5 mg p.o. nightly.  4. Clonazepam 0.5 mg p.o. b.i.d.  5. Atenolol 25 mg p.o. daily (was on 50 mg p.o. daily).  6. Trazodone 50 mg p.o. nightly.  7. Remeron 4-5 mg p.o. nightly.  8. Aspirin 325 mg p.o. daily (over-the-counter).  9. Niaspan ER 500 mg p.o. nightly.   PROCEDURES:  1. Chest x-ray dated November 19, 2007.  This showed no acute      cardiopulmonary abnormalities.  2. Head CT scan dated November 20, 2007.  This shows overall normal left      ventricular systolic function, EF 55%, no left ventricular regional      wall motion abnormalities, the aortic valve was mildly calcified.      There was no echocardiographic evidence for a cardiac source of      embolism.  3. Stress myoview dated November 21, 2007. This was a negative study,      with no fixed or reversible ischemia demonstrated.   CONSULTATION:  Mohan N. Sharyn Lull, MD, cardiologist.   ADMISSION HISTORY:  As in H and P notes of November 19, 2007, dictated by  Dr. Peggye Pitt.  However, in brief, this is a 30 year old male,  with known history of seizure disorder, bronchial asthma, depression,  anxiety, migraine headaches, presenting following a syncopal episode  which reportedly occurred while he was doing yard work, when he suddenly  developed  severe substernal chest pain radiating to his left shoulder,  associated with fluttering and palpitations, and he passed out.  Reportedly, he remembers waking up in the ambulance.  He was brought to  the emergency department and was subsequently admitted for further  evaluation, investigation and management.   CLINICAL COURSE:  1. Syncopal episode.  Differential diagnostic considerations include      seizure episode vs. vasovagal episode vs. Paroxysmal      tachyarrhythmia.  The patient was placed on telemetry monitoring,      administered an intravenous infusion of normal saline.  A 2-D      echocardiogram was done, which was essentially unremarkable; for      details, refer to procedure list above.  Cardiac enzymes were      cycled, and remained unelevated.  A 12-lead EKG showed no      arrhythmia or acute ischemic changes.  During the course of his  hospitalization, there were no recurrences of syncope and      orthostatic blood pressure measurements showed no significant      postural drop.  A cardiology consultation was kindly provided by      Dr. Sharyn Lull who performed a stress Myoview on November 21, 2007.  The      first portion of this test, a maximum workload, was negative      without any significant EKG changes.  Myoview was negative for      ischemia.  Dr. Sharyn Lull has recommended low-dose Aspirin, Niaspan      and reduction in Atenolol dosage to 25 mg p.o. daily.  These      recommendations have been implemented.  Outpatient event monitoring      has been arranged with Dr. Ladona Ridgel, EPS, and patient has an      appointment scheduled to see Dr. Ladona Ridgel on December 03, 2007.   1. Bronchial asthma.  Patient has a known history of bronchial asthma.      This was not problematic during the course of his hospitalization.   1. History of migraine headaches.  There were no occurrences during      the course of this hospitalization.   1. Seizure disorder.  During the course of  patient's hospitalization,      no seizure episodes were recorded.   1. Hypertension.  Patient remained normotensive throughout the course      of his hospitalization.   DISPOSITION:  Patient was on November 21, 2007, completely asymptomatic  and very keen to be discharged. He was, therefore, discharged  accordingly.   DIET:  Heart-healthy.   ACTIVITY:  As tolerated.   FOLLOWUP INSTRUCTIONS:  Patient has been arranged for appointment with  Dr. Ladona Ridgel, electrophysiologist, on November 02, 2007, at 3:30 p.m.,  telephone number [phone].  In addition, he is recommended to follow  up with his primary neurologist, Dr. Lesia Sago, per prior scheduled  appointment.      Isidor Holts, M.D.  Electronically Signed     CO/MEDQ  D:  11/21/2007  T:  11/21/2007  Job:  [id number]   cc:   Doylene Canning. Ladona Ridgel, MD  [address]  [address]  [place]  [place] [address]   Eduardo Osier. Sharyn Lull, M.D.  Fax: [phone]   C. Lesia Sago, M.D.  Fax: [phone]
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
## 2010-08-19 NOTE — Procedures (Signed)
HISTORY:  This is a 30 year old patient who is being evaluated for episode  of seizure-like activity. The patient has a history of asthma. This is a  routine EEG. No skull defects are noted.   MEDICATIONS:  Phenergan if needed.   EEG CLASSIFICATION:  Normal awake.   DESCRIPTION:  According to background rhythms, this recording consists of a  fairly well modulated, medium amplitude alpha rhythm of 9 hertz that is  reactive to eye opening and closure. As the record progresses, the patient  appears to remain in the wakened state throughout the entirety of the  recording. Photic stimulation is performed resulting in bilateral and  symmetric photic driving response. Hyperventilation is also performed  resulting in a minimal build of the background rhythm activities without  significant slowing seen. At no time during the recording did there appear  to be evidence of spike or spike wave discharges or evidence of focal  slowing. EKG monitor shows no evidence of cardiac rhythm abnormalities with  a heart rate of 84.   IMPRESSION:  This is a normal EEG recording in the wakened state. No  evidence of ictal or interictal discharges were seen.       ZOX:WRUE  D:  10/06/2004 14:41:06  T:  10/06/2004 15:39:12  Job #:  [id number]

## 2010-08-19 NOTE — Op Note (Signed)
[name] [name], [name] [name]                  ACCOUNT NO.:  [id number]   MEDICAL RECORD NO.:  [id number]          PATIENT TYPE:  AMB   LOCATION:  DAY                          FACILITY:  [facility]   PHYSICIAN:  Sigmund I. Patsi Sears, M.D.DATE OF BIRTH:  1981-03-04   DATE OF PROCEDURE:  06/15/2006  DATE OF DISCHARGE:  06/15/2006                               OPERATIVE REPORT   PREOP DIAGNOSIS:  Gross hematuria.   POSTOP DIAGNOSES:  1. Penoscrotal junction stricture.  2. Urethral stricture.   OPERATION:  1. Cystourethroscopy.  2. Urethral dilation.   SURGEON:  Sigmund I. Patsi Sears, M.D.   ANESTHESIA:  General LMA.   PREPARATION:  After appropriate preanesthesia, the patient was brought  to the operating room, and placed on the operating room in the dorsal  supine position where general LMA anesthesia was introduced.  He was  then replaced in the dorsal lithotomy position where the pubes was  prepped with Betadine solution, and draped in the usual fashion.   REVIEW OF HISTORY:  This 30 year old male has been noted to have gross  hematuria, with blood coming from his penis when he begins to get an  erection, with some dysuria.  He is now for cystoscopy.   DESCRIPTION OF PROCEDURE:  Cystourethroscopy was accomplished in the  retrograde fashion.  The pendulous urethra appeared to be normal, but  there was a penoscrotal junction stricture noted, which was dilated with  the [employer] sounds.  Repeat cystoscopy into the bladder was normal.  The bladder appeared normal with no evidence of trabeculation, no  cellules, and no stones or tumors.  Clear efflux was seen from both  orifices.   The bladder was drained of fluid and Xylocaine jelly placed in the  urethra.  The patient was given IV Toradol and awakened and taken to the  recovery room in good condition.      Sigmund I. Patsi Sears, M.D.  Electronically Signed     SIT/MEDQ  D:  06/15/2006  T:  06/17/2006  Job:  [id number]

## 2010-08-19 NOTE — H&P (Signed)
[name] [name], [name] [name]                  ACCOUNT NO.:  [id number]   MEDICAL RECORD NO.:  [id number]          PATIENT TYPE:  INP   LOCATION:  1823                         FACILITY:  [facility]   PHYSICIAN:  Jackie Plum, M.D.DATE OF BIRTH:  [birth year]   DATE OF ADMISSION:  10/05/2004  DATE OF DISCHARGE:                                HISTORY & PHYSICAL   CHIEF COMPLAINT:  Mental status change.  The patient is 30 years of age and  has history of asthma.  Never intubated, not steroid-dependent.  He was  brought to the ED by EMS (EMT notes were not available for my review at the  ED).   HISTORY OF PRESENT ILLNESS:  Apparently the patient was well until some time  last week when he was exposed to rabies.  Apparently the patient was  initiated on treatment with rabies vaccine and immunoglobulin after exposure  to rabid dog.  He had his third and last shots around 3 p.m. yesterday.  After the last shot according to the mother and the patient, he started  feeling unwell and became very lethargic and lightheaded.  There was no  history of fever or chills, headaches, myalgias, nausea, vomiting, abdominal  pain.  He was brought to the emergency room.  At the emergency room, the  patient was quite unresponsive.  He was noted to be shaking his right upper  extremity incessantly.  He also kept blinking his both eyelids repetitively.  A CT scan of the head was done and was noted to be negative for any acute  intracranial abnormalities.  He had preliminary CT.  CBC and complete  metabolic panel as well as urinalysis and urine drug screen were done and  there were no significant abnormalities apart from hypokalemia of 3.2.  The  hospitalist service was, therefore, asked to admit for at least observation  overnight.   When I first saw the patient, the patient was lying on a stretcher and he  was not responding to loudly calling his name.  He was blinking is eyes  repetitively as well as intermittent  tremors of his right upper extremity  was noted.  When I lifted his hand up, his hand stayed up without falling  down.  Later on, I sat him up in bed and he was able to sit up and talk to  me thereafter.  This was about 15 minutes later.  At that point he started  talking to me.  He indicated that he felt dizzy, had not been feeling well.  He was able to characterize specifically what it meant by not feeling  well, and the only symptom that he could mention was that he had been  feeling lightheaded and felt like he was going to pass out.  He denies any  upset stomach symptomatology.  After consistent and insistent interview, the  patient became a little bit more awake and was able to consistently be  engaged in conversation.  He denies any chest pain or shortness of breath.  He also denies any fever.   PAST MEDICAL HISTORY:  Positive  for history of bronchial asthma.  He does  not have any psychiatric history.   MEDICATIONS:  Advair and albuterol.   SOCIAL HISTORY:  He has not had any social problems and has not had any form  of conflicts, as far as his mother is aware.   FAMILY HISTORY:  Positive for diabetes and hypertension in his father.  He  is the second of two children.  He denies any cigarette use or alcohol use.   REVIEW OF SYSTEMS:  Unremarkable though it was difficult to obtain a  consistent review, as the patient was quite lethargic.   PHYSICAL EXAMINATION:  VITAL SIGNS:  Blood pressure 140/71, pulse 87,  respirations 22, temperature 98.7.  Oxygen saturation 100% on 2 L oxygen by  nasal cannula.  GENERAL:  The patient was in acute pulmonary distress.  CNS:  Initially the patient was lethargic with repeated blinking of both  eyes as well as tremors which were not really tonic clonic involving his  right upper extremity.  He was initially unresponsive to calling his name.  However, consistent and insistent arousal, the patient became more and more  alert so that he was  able to engage and assist with his history.  He did not  have any motor deficit.  Deep tendon reflexes were 2+ upper extremities and  2+ lower extremities and were symmetrical and equal.  Kernig's and  Brudzinski's signs were all negative.  HEENT:  Normocephalic, atraumatic.  Pupils about 4 mm in both eyes, reactive  to light.  Extraocular muscles were intact.  Oropharynx was moist.  No  exudate or erythema.  NECK:  Supple.  Did not have any cervical lymphadenopathy.  There was no  JVD.  CARDIOPULMONARY:  Auscultation was unremarkable.  ABDOMEN:  Full, soft, nontender.  Bowel sounds were present. There were  normoactive.  No obvious tenderness, no edema.  SKIN:  There was no obvious skin lesions.  Skin was warm and dry.   LABORATORY DATA:  CT scan as noted above.  WBC 10.5, hemoglobin 14,  hematocrit 41.8, MCV 82.1. Platelet count 235.  Urinalysis:  Color was  straw, appearance clear, specific gravity 1.015, glucose negative,  hemoglobin negative, bilirubin negative, ketones negative, protein negative,  nitrite negative, leukocyte esterase negative.  Drug abuse screen was  negative for opiates, cocaine, benzodiazepines, barbiturates.  Sodium 139,  potassium 3.2, chloride 105, CO2 27, glucose 104, BUN 14, creatinine 1,  calcium 8.7, total protein 6.8, albumin 3.6, AST  17, ALT 17, alk phos 81,  total bilirubin 0.7.   IMPRESSION:  1.  Presyncope/lightheadedness.  2.  Mental status change, rule out malingering/convection reaction.  3.  Hypokalemia.  4.  Bronchial asthma.   DISCUSSION:  The patient presents with history of feeling unwell with  lightheadedness. He was also noted to be unresponsive on presentation to the  ED with abnormal movements.  However, was consistent on arousal by me.  The  patient is in agreement with work-up and by the time of my dictation, the  patient was more alert and I was to engage in a consistent conversation.  He sounded like he was a little bit  lethargic.  It is not clear to me whether  patient's condition is consistent to adverse reaction to the rabies  vaccine/immunoglobulin.  These vaccines rarely may cause systemic reactions  with fever, headaches, nausea, vomiting, abdominal pain and dizziness which  the patient actually complains of.  Also I am not sure whether this  represents  malingering/convection reaction.   The plan is to admit the patient for observation overnight.  Give some  supportive care and possibly get an infectious disease evaluation in the  morning.  Should it become clear that his symptoms are more of adverse  reaction to the vaccine, then it might be necessary to report this event to  Vaccine Adverse Events Reporting System, [phone]. This is for when  the patient does not have any direct evidence of rabies.  His hypokalemia  will be repleted.       GO/MEDQ  D:  10/05/2004  T:  10/05/2004  Job:  [id number]

## 2010-08-19 NOTE — Op Note (Signed)
[name] [name], [name] [name]                            ACCOUNT NO.:  [id number]   MEDICAL RECORD NO.:  [id number]                   PATIENT TYPE:  INP   LOCATION:  NA                                   FACILITY:  [facility]   PHYSICIAN:  Griffith Citron Mohorn, D.D.S.          DATE OF BIRTH:  08/02/1980   DATE OF PROCEDURE:  02/05/2002  DATE OF DISCHARGE:                                 OPERATIVE REPORT   PREOPERATIVE DIAGNOSES:  Maxillary hypoplasia, mandibular hyperplasia, class  3 malocclusion.   POSTOPERATIVE DIAGNOSES:  Maxillary hypoplasia, mandibular hyperplasia,  class 3 malocclusion.   PROCEDURE:  Bilateral sagittal split ramus osteotomy setback, maxillary Le  Forte osteotomy advancement.   SURGEON:  Griffith Citron Mohorn, D.D.S.   Tina GriffithsLovena Le, D.D.S.   ANESTHESIA:  General anesthesia.   ESTIMATED BLOOD LOSS:  450 cc.   INDICATIONS FOR PROCEDURE:  [name] [name] had been evaluated by myself and a local  orthodontist, Dr. Gearldine Shown, regarding his inability to [facility] food  completely as well as persistent temporomandibular disorder symptoms.  Following clinical and radiographic examination, he was found to have severe  class 3 malocclusion with only approximately four teeth in occlusion. Due to  the extent of the skeletal discrepancy between his maxilla and his mandible,  it was easily determined that orthodontics alone would not provide him with  a stable occlusion. This determined that he would have to undergo  orthognathic surgery to provide a more normal skeletal relationship.  Preoperatively we had discussed extensively potential risks such as  infection, pain, paresthesia. Today we are to undergo general anesthesia for  maxillary Le [place] 1 osteotomy advancement with a bilateral sagittal split  ramus osteotomy setback.   DESCRIPTION OF PROCEDURE:  The patient was brought to the room and placed in  a supine position. Once general anesthesia was induced via  nasotracheal  intubation, the patient was prepped and draped for maxillofacial procedure  of this type. Initially local anesthesia was injected bilaterally in the  posterior mandible. Initially attention was directed towards the right side.  A electrocautery Bovie was utilized to incise the mucosa extending from the  mid ramus inferiorly and anteriorly in the buccal vestibule to an area  lateral to tooth #30. Incision continued deep through the periosteum.  Periosteal elevator was used to create a mucoperiosteal flap reflecting a  portion of the lateral cortex and the mandible extending over the ramus to  the medial surface again continuing posteriorly in a subperiosteal fashion  with care being taken to avoid the inferior alveolar foramen and  neurovascular bundle. A Henahan was utilized to reflect the soft tissue  mesally superior to the inferior alveolar foramen thus protecting that soft  tissue. A long Lindeman bur was then utilized to create a horizontal medial  cut once the inferior alveolar foramen was located. Copious amounts of  saline irrigation was utilized. Once this cut  was made to approximately 1/2  the width of the ramus with again care being taken to protect the  neurovascular bundle of the inferior alveolar nerve. The cut was evaluated  and found to be of adequate depth and length. Next a 701  bur was then  utilized with copious amounts of saline irrigation to continue this  osteotomy along the anterior border of the ramus inferiorly and anteriorly  to an area lateral to tooth #31. Next a 703 bur was utilized to connect this  osteotomy along the lateral cortex extending down through the inferior  border with copious amounts of saline irrigation. Once the entire length of  the osteotomy was found to be at the adequate depth, a saline soaked gauze  was then placed to facilitate hemostasis on the right side.   Attention was then directed towards the left side. Again in a  similar  fashion, electrocautery Bovie was utilized to incise the mucosa down through  the periosteum. A periosteal mucoperiosteal flap was created with a  periosteal elevator with minimal reflection of the lateral portion of the  ramus reflecting inferiorly to the inferior border. Care was then taken to  create the subperiosteal flap on the lingual cortex of the ramus extending  posterior to the inferior alveolar foramen. A Henahan was then utilized to  reflect the soft tissue medially again the foramen was located and marked.  The long Lindeman bur was again utilized to create the horizontal osteotomy  superior to the foramen with copious amounts of normal saline irrigation.  This osteotomy continued posterior to the foramen and again was superior to  its location. Once this was created to the adequate depth of bleeding bone,  a 701 bur was utilized with normal saline irrigation to continue the  osteotomy along the anterior border of the ramus continuing anteriorly to an  area lateral to tooth #18. Again the vertical lateral osteotomy was  continued with a 703 bur with normal saline irrigation through the inferior  border. Once the entire length of the osteotomy was found to the adequate  depth again a half pack was placed to filter hemostasis.   Attention was then directed towards the maxillary osteotomy. Local  anesthesia consisting of 0.5% Marcaine with 1:200,000 epinephrine was  injected in the maxillary vestibule bilaterally. Next, electrocautery Bovie  was utilized to create a vestibular incision approximately 6 mm superior to  the attached gingival tissue extending from an area of tooth #4 anteriorly  across the midline and posteriorly to the area of tooth #13. This  essentially continued through mucosa down through the periosteum. A  mucoperiosteal flap was then reflected with periosteal elevator exposing the entire maxilla from the right zygomatic buttress exposing both  infraorbital  neurovascular bundles as well as both piriform rims. A subperiosteal flap  was also continued posteriorly along the lateral wall of the maxilla to the  pterygomaxillary fissure. Once this was completed bilaterally, the nasal  mucosa was then gently separated from the piriform rims bilaterally. The  anterior nasal spine was identified and exposed. The nasal mucosa was again  reflected from the floor of the nasal cavity as well as along the lateral  walls. Next, vertical reference ports then placed adjacent to the piriform  rims bilaterally as well as next to the zygomatic buttress bilaterally. A  reciprocating saw was then utilized to create a horizontal osteotomy across  the anterior wall of the maxilla between the vertical reference points with  care being taken  to avoid encroaching within 5 mm of the apex of tooth  number 5 and 12. 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  Once this osteotomy was created bilaterally in a satisfactory fashion, the  osteotomy was then continued posteriorly in an inferior direction towards  the pterygomaxillary fissure. Next a special osteotomy was then utilized to  score the posterior wall of the maxillary sinus bilaterally. The protected  nasal osteotome was then utilized to continue the osteotomy through the  lateral wall of the nasal cavity stopping this osteotomy anterior to the  greater palatine neurovascular bundle. Once both osteotomies for the lateral  walls of the nasal cavity were completed, a nasal septal osteotome was then  utilized to separate the septum from the floor of the nasal cavity. Next, a  pterygomaxillary osteotome was then utilized to separate the maxilla from  the pterygoid plates bilaterally. This was completed with palpation  intraorally to ensure completion of this osteotomy. Next, the maxilla was  carefully down fractured with the nasal mucosa and gently reflected and  separated from the floor of  the nasal cavity to reduce tears in the nasal  mucosa. With the down fracture completed, Tessier instruments  were then  utilized to completely mobilize the maxilla. With the maxilla completely  mobilized, the intermediate splint was placed between the maxillary and  mandibular dentition. He was then placed into endomaxillary fixation  utilizing 26 gauge wires connecting the maxillary and mandibular orthodontic  brackets. With the intermediate splint in place, the patient was gently  rotated superiorly. Loose bony fragments were removed from the maxilla. No  interferences were noted with the maxilla going completely to a stable  position. With the maxilla in a satisfactory position, the maxillomandibular  complex was rotated inferiorly and the nasal mucosa was sutured utilizing a  4-0  Chromic suture in a running fashion. A portion of the nasal septum was  trimmed. The area superior to the maxilla was then irrigated with normal saline and suctioned dry. No excessive bleeding was noted. The maxilla was  then rotated into its new position and stabilized utilizing four L shaped 5  hole plates manufactured by [employer]. These plates were 1.7 in diameter.  Self drilling cruciform shaped screws were utilized to stabilize the plates  placing a total of 5 screws per plate. These L shaped  plates were placed in  the zygomatic buttress region bilaterally as well as adjacent to the  piriform rims bilaterally. Once all four plates were placed, the maxilla was  found to be adequately stable. The patient was released from intermaxillary  fixation and the mandible still allowed to rotate freely and the maxilla was  found to be in the planned position based on preoperative models.   Attention was then redirected towards the mandible. Initially on the right  side, spatula osteotomes were utilized to sequentially widen and separate  the distal and proximal segments of the mandibular osteotomy. Next the  [facility]  spreaders were then utilized to complete the osteotomies on the right side.  A Lorette Ang was used to carefully dissect the neurovascular bundle from the  proximal segment. A portion of the neurovascular bundle was found to be  torn; however, the majority of it was intact. There was no excessive  bleeding at this time. Once the osteotomy was complete, the medial pterygoid  was stripped from the distal segment utilizing a J stripper. A half pack was  placed on the right side.   Attention was  then directed towards completion of the osteotomy on the left  side. Again sequentially enlarging osteotomes were utilized to widen the  osteotomy. [employer] were utilized to complete the osteotomy and  again the neurovascular bundle was dissected from the proximal segment. The  medial pterygoid was stripped from the distal segment and the mandible was  found to be completely mobilized bilaterally. The patient was then placed  into the final splint and placed into intermaxillary fixation again  utilizing 26 gauge wires. The proximal segments bilaterally were then placed  in the planned position and the excess bone was noted and removed with  fissured bur with normal saline irrigation. Again initially on the right  side, the proximal segment was rotated into its planned position and did  stabilized utilizing a modified Allis clamp. Next, bicortical screws were  placed to stabilize the right mandibular segments. These screws were  fabricated by [employer] and were 2.0 in diameter and 14 mm in length. Two  screws were placed near the superior border. The Allis clamp was removed and  the proximal segment was found to be stable. Attention was then directed  towards the left side where again the proximal segment was rotated into the  appropriate position and stabilized utilizing modified Allis clamp. This  segment was again stabilized utilizing Leibinger 2.0 screws approximately 12 mm in length.  The Allis clamp was removed and again the proximal segment was  found to be stable. The patient was released from intermaxillary fixation  and the final splint was removed. The patient was allowed to passively  rotate into occlusion and was found to be identical to the planned  postoperative occlusion. It was decided that with the occlusion as stable as  it was, the splint would not be wired into place.   The maxillary incision was irrigated thoroughly with normal saline and  extended superior to the anterior nasal spine. The soft tissue adjacent to  the alar of the nose was reapproximated with a 2-0 Vicryl suture extending  through the anterior nasal spine hole. Next a V-Y closure of the lip was  performed utilizing a 4-0 Vicryl suture. The midline was sutured in place  with a 4-0 Vicryl suture and the remaining portion of the maxillary incision  was then closed utilizing a 4-0 Vicryl suture in a running fashion  bilaterally. Both mandibular incisions were then irrigated with normal  saline. Both mandibular incisions were also closed with 4-0 Vicryl suture in  a running fashion. The oral cavity was thoroughly irrigated and the  oropharyngeal throat pack was removed. The patient was then placed into  maxillary fixation utilizing heavy elastics. The patient was allowed to  awaken and he was extubated in the operating room and he was transferred to  the PACU in stable satisfactory condition.                                               Griffith Citron Mohorn, D.D.S.    Gardenia Phlegm  D:  02/05/2002  T:  02/05/2002  Job:  [id number]

## 2010-08-19 NOTE — Consult Note (Signed)
[name] [name], [name] [name]                  ACCOUNT NO.:  [id number]   MEDICAL RECORD NO.:  [id number]          PATIENT TYPE:  OBV   LOCATION:  5501                         FACILITY:  [facility]   PHYSICIAN:  Michael L. Reynolds, M.D.DATE OF BIRTH:  January 11, 1981   DATE OF CONSULTATION:  10/07/2004  DATE OF DISCHARGE:                                   CONSULTATION   REQUESTING PHYSICIAN:  [name], M.D.   REASON FOR EVALUATION:  Possible seizures.   HISTORY OF PRESENT ILLNESS:  This is the initial inpatient consultation  evaluation of this 30 year old man with a past medical history which  includes asthma.  He has been seen on one previous occasion by our practice  in 2004 by Dr. Sandria Manly, at which time he complained of headaches, neck and back  pain and had some unremarkable imaging studies.  The patient was admitted on  October 05, 2004, after coming to the hospital complaining of not feeling well.  He has difficulty describing this except that he felt somewhat dizzy and  lightheaded and then just didn't feel right.  He says that he recalls  complaining about this at home to his mother and then does not really recall  coming to the hospital, only waking up in the hospital.  While here, he says  he has had two or three more episodes of which he has felt this funny  sensation and subsequently lost some time and apparently been unconscious  and unresponsive.  The admission H&P describes him as initially being poorly  responsive with intermittent blinking and tremors of his right upper  extremity which gradually cleared, although there seemed to be some  confusion following the event.  Similar events have been described, the most  recent being this morning, at which time he reported that he had an  increased heart rate, was described as foaming at the mouth and  unresponsive.  He has been evaluated during these spells and apparently some  suspicion has been raised that they are psychogenic events.   The patient  reports that since he had his spell this morning, his right leg has felt  somewhat numb.  He denies any particular problems with pain or strength, but  he just says that it feels funny when he walks on it.  Subsequently, he  feels unsteady with ambulating.  Neurologic consultation is requested for  further considerations.   PAST MEDICAL HISTORY:  Remarkable for asthma, which has been fairly well-  controlled.  Apparently he denies any chronic medical problems.  He was  recently exposed to a dog about which there was some concern for rabies, and  he received three shots of rabies immunoglobulin, the last of which he  actually received the morning prior to developing these symptoms and coming  in on the evening of July 5.  He has had jaw surgery in the past as well.   Family/social/review of systems as outlined in the admission H&P of October 05, 2004, by Dr. Julio Sicks.   MEDICATIONS:  Prior to coming in, he was taking only albuterol and  Advair.   Here in the hospital, he is receiving albuterol nebulizer and Protonix.   PHYSICAL EXAMINATION:  VITAL SIGNS:  Temperature 98.3, blood pressure  118/58, pulse 68, respirations 20.  CBG 106.  O2 saturation 99% on room air.  GENERAL:  This is a healthy-appearing male supine in the hospital bed in no  distress.  HEENT:  Cranium is normocephalic and atraumatic.  Oropharynx benign.  NECK:  Supple without carotid bruits.  CARDIAC:  Heart regular rate without murmur.  NEUROLOGIC:  Mental status:  He is awake, alert and fully oriented to time,  place and person.  Recent and remote memory are intact.  Attention span,  concentration and fund of knowledge are all appropriate.  His speech is  fluent and not dysarthric.  He has no defects to confrontational naming and  he can repeat a phrase.  Mood is euthymic and affect appropriate.  Cranial  nerves:  Funduscopic exam is benign.  Pupils are large but symmetrica  nondistended briskly reactive.   Extraocular movements full without  nystagmus.  Visual fields full to confrontation.  Hearing is intact and  symmetric to finger rub.  Facial sensation is intact to pinprick.  Face,  tongue and palate move normally and symmetrically, and shoulder shrug  strength is normal.  Motor testing:  Normal bulk and tone, normal strength  in all tested extremity muscles.  Sensation:  He reports diminished pinprick  sensation over the right leg compared to the left in a nondermatomal  distribution.  Otherwise, normal to light touch and pinprick throughout.  Coordination:  Rapid movements are performed well, finger-to-nose and heel-  to-shin are performed well.  Gait:  He arises from chair easily, and his  stance favors the left leg.  He walks with minimal assistance, saying that  he does not trust his right leg to bear his weight.  Reflexes 2+ and  symmetric.  Toes are downgoing.   LABORATORY REVIEW:  CBC on admission was normal and remains so this morning.  CMET on admission was remarkable only for an elevated glucose of 114.  BMET  from this morning is normal.  He had a CT of the head, which demonstrated no  particular abnormalities.  He also had an EEG performed July 6 and  interpreted by Dr. Anne Hahn as normal.   IMPRESSION:  1.  Episodic alteration of consciousness with involuntary motor activity.      These spells actually to me sound fairly convincing for seizures, likely      complex partial with some secondary generalization.  However, a      psychogenic event certainly cannot be excluded and there is no obvious      etiology for new-onset epilepsy.  2.  Right lower extremity numbness of uncertain etiology.   RECOMMENDATIONS:  1.  MRI of the brain with contrast.  2.  Ammonia capsules to the bedside.  These should be used as needed for      seizure activity and the response to the stimulus during the ictal phase      recorded. 3.  If these episodes continue, he will require empiric  therapy with an      anticonvulsant such as Dilantin.  At that point it would probably be      reasonable to think about transferring him to an epilepsy monitoring      unit, which is not available to [facility] at this time but is available at      [facility] [place] or  [facility].   Will follow with you.  Thank you for the consultation.       MLR/MEDQ  D:  10/07/2004  T:  10/08/2004  Job:  [id number]

## 2010-08-19 NOTE — Discharge Summary (Signed)
[name] [name], [place]                  ACCOUNT NO.:  [id number]   MEDICAL RECORD NO.:  [id number]          PATIENT TYPE:  OBV   LOCATION:  5501                         FACILITY:  [facility]   PHYSICIAN:  [name], M.D.DATE OF BIRTH:  Jul 05, 1980   DATE OF ADMISSION:  10/05/2004  DATE OF DISCHARGE:  10/10/2004                           DISCHARGE SUMMARY - REFERRING   DISCHARGE DIAGNOSES:  1.  Seizures versus pseudoseizures.  2.  Probable migraine headaches.  3.  Status post rabies vaccine and immunoglobulin.  4.  Hypokalemia - resolved.  5.  History of asthma.   CONSULTATIONS:  1.  Infectious disease - [name], M.D., and [name], M.D.  2.  Neurology - [name]. [name], M.D.   STUDIES:  1.  EEG - Normal EEG, no evidence of ictal or intraictal discharges seen.  2.  CT scan of head - negative exam, no CT findings of rabies encephalitis.  3.  MRI of brain - negative structural MRI of brain.   HISTORY:  The patient is a 30 year old white male with a history of asthma  who presented to the ER with complaints of mental status change/lethargy.  It was reported that the patient had been doing well until the week prior to  presentation when she was exposed to rabies, she was then started on  treatment with the rabies vaccine and immunoglobulin.  He received his third  and last shot about 3 p.m. the day prior to presentation and according to  his mother, he started feeling very lethargic and lightheadedness.  He  denied fevers, chills, headaches, myalgias, nausea, vomiting and abdominal  pain.  Per admission H&P, the patient was quite unresponsive when he was  brought to the ER.  He also was noted to be shaking his right upper  extremity and kept blinking his eye lids repetitively.  A CT scan was done  in the ER and the results are as stated above.  Also, urine drug screen was  done and it was negative.  Per the admitting physician, when the patient was  examined in the  ER, he was still blinking at that time with intermittent  tremors in the right upper extremity and when his hand was lifted up, it  stayed up without falling and later on, he was able to sit up and talk - 15  minutes later.  At that point, he stated that he had been feeling  lightheaded and felt like he was going to pass out.  He denied any chest  pain, shortness of breath, or fevers.   PHYSICAL EXAMINATION ON ADMISSION:  As per Osei-Bonsu.  Blood pressure  140/71, pulse 87, respiratory rate 22, temperature 98.7, O2 saturation 100%  on two liters nasal cannula.  Pertinent findings on the exam were with the  neurologic exam that initially the patient was lethargic and repetitively  blinking both eye lids and had tremors which were not really tonic clonic  but involved the right upper extremity.  The patient was initially  unresponsive to name calling but following consistent/insistent arousal, he  became more  alert and was able to engage on assist with the history.  No  motor deficits were noted.  DTRs +2 in upper and lower extremities and  symmetric.  Kernig's and Brudzinski's signs were negative.  The rest of the  patient's physical examination was noted to be within normal limits.   LABORATORY DATA:  His white cell count was 10.5 with hemoglobin of 14,  hematocrit 41.8, MCV 82.1, platelet count 235.  UA was negative for  infection and the urine drug screen was negative as well.  His sodium was  139 with potassium of 3.2, chloride 105, CO2 27, glucose 104, BUN 14,  creatinine 1, calcium 8.7.  Total protein 6.8, albumin 3.6.  His LFTs were  noted to be within normal limits.   HOSPITAL COURSE:  PROBLEM #1 -  SEIZURES VERSUS PSEUDOSEIZURES:  Following  admission, the patient had another episode of what was described as shaking  on the right side, rhythmic movements, and it was noted during that time  that the patient was able to respond to deep pain/plantar reflexes.  Shortly  thereafter,  the patient's tremors stopped and the patient did not seem to  have any confusion following that.  A stat prolactin level was done and this  was normal at 11.5.  An EEG was also done and the results are as stated  above.  No ictal activity or intra-ictal activity noted.  The patient also  had another episode of right leg numbness which spontaneously resolved while  he was in the hospital.  Neurology was consulted and an MRI of the brain was  done and it was negative.  Following this, the patient did not have any  further seizure-like activity and Dr. [name] followed him during his  hospital stay and the impression was seizures versus pseudoseizures and he  questioned whether this was a transient reaction to the immunoglobulin  shots.  The patient remained hemodynamically stable with no further seizure-  like activities as already discussed and Dr. [name] recommended that the  patient be discharged to follow up in his office in a few weeks.   PROBLEM #2 -  PROBABLE MIGRAINE HEADACHES:  The patient had headaches while  in the hospital with nausea and complaints of photophobia and Dr. [name]  gave the patient a dose of DHE with relief of his symptoms.  He has not had  any further episodes and he will be discharged at this time to follow up  with Dr. [name].   PROBLEM #3 -  STATUS POST RABIES EXPOSURE:  As already indicated, the  patient had received rabies vaccines as well as the immunoglobulin prior to  admission and infectious disease was consulted and Dr. Bonnetta Barry impression was  that this was not rabies since the incubation period was too short.  The  patient will be discharged at this time to follow up with his primary care  physician.   PROBLEM #4 -  HYPOKALEMIA:  His potassium was replaced in the hospital, his  last potassium today, prior to discharge, is 3.6.   PROBLEM #5 -  HISTORY OF ASTHMA:  Patient was maintained on bronchodilators while in the hospital.  He is to  continue Advair and albuterol upon  discharge.   DISCHARGE MEDICATIONS:  Patient to continue preadmission medications.   FOLLOW UP CARE:  1.  Dr. [name], [facility], in two to three weeks.  2.  Primary care physician in one week.   CAUSE OF DEATH:  Improved/stable.  ACV/MEDQ  D:  10/10/2004  T:  10/10/2004  Job:  [id number]   cc:   Casimiro Needle L. [name], M.D.  [address]  [address]  [place]  [place] [address]  Fax: [phone]

## 2010-09-14 ENCOUNTER — Emergency Department (HOSPITAL_COMMUNITY)
Admission: EM | Admit: 2010-09-14 | Discharge: 2010-09-14 | Disposition: A | Discharge status: Home / Self Care | Payer: Medicare Other | Attending: Emergency Medicine | Admitting: Emergency Medicine

## 2010-09-14 DIAGNOSIS — W64XXXA Exposure to other animate mechanical forces, initial encounter: Secondary | ICD-10-CM | POA: Insufficient documentation

## 2010-09-14 DIAGNOSIS — S058X9A Other injuries of unspecified eye and orbit, initial encounter: Secondary | ICD-10-CM | POA: Insufficient documentation

## 2010-09-14 DIAGNOSIS — IMO0002 Reserved for concepts with insufficient information to code with codable children: Secondary | ICD-10-CM | POA: Insufficient documentation

## 2010-09-14 DIAGNOSIS — F341 Dysthymic disorder: Secondary | ICD-10-CM | POA: Insufficient documentation

## 2010-09-14 LAB — BASIC METABOLIC PANEL
BUN: 15 mg/dL (ref 6–23)
CO2: 27 mEq/L (ref 19–32)
Calcium: 9.5 mg/dL (ref 8.4–10.5)
Chloride: 99 mEq/L (ref 96–112)
GFR calc Af Amer: >60 mL/min (ref >60)
GFR calc non Af Amer: >60 mL/min (ref >60)
Glucose, Bld: 126 mg/dL — ABNORMAL HIGH (ref 70–99)
Sodium: 136 mEq/L (ref 135–145)

## 2010-09-14 LAB — DIFFERENTIAL
Basophils Absolute: 0 10*3/uL (ref 0.0–0.1)
Basophils Relative: 0 % (ref 0–1)
Eosinophils Absolute: 0.1 10*3/uL (ref 0.0–0.7)
Lymphocytes Relative: 16 % (ref 12–46)
Monocytes Relative: 7 % (ref 3–12)
Neutro Abs: 6.5 10*3/uL (ref 1.7–7.7)
Neutrophils Relative %: 75 % (ref 43–77)

## 2010-09-14 LAB — RAPID URINE DRUG SCREEN, HOSP PERFORMED
Amphetamines: NOT DETECTED
Benzodiazepines: NOT DETECTED
Cocaine: NOT DETECTED
Opiates: NOT DETECTED
Tetrahydrocannabinol: NOT DETECTED

## 2010-09-14 LAB — CBC
Hemoglobin: 14.6 g/dL (ref 13.0–17.0)
MCH: 30.5 pg (ref 26.0–34.0)
Platelets: 184 10*3/uL (ref 150–400)
RBC: 4.78 MIL/uL (ref 4.22–5.81)
WBC: 8.7 10*3/uL (ref 4.0–10.5)

## 2010-10-19 ENCOUNTER — Ambulatory Visit (INDEPENDENT_AMBULATORY_CARE_PROVIDER_SITE_OTHER): Payer: Medicare Other | Admitting: Internal Medicine

## 2010-10-19 VITALS — BP 138/89 | HR 97 | Temp 97.8°F | Wt 210.1 lb

## 2010-10-19 DIAGNOSIS — K120 Recurrent oral aphthae: Secondary | ICD-10-CM

## 2010-10-19 DIAGNOSIS — Z8719 Personal history of other diseases of the digestive system: Secondary | ICD-10-CM | POA: Insufficient documentation

## 2010-10-19 NOTE — Progress Notes (Signed)
  Subjective:    Patient ID: [name] [name], male    DOB: 07-13-1980, 30 y.o.   MRN: [id number]  HPI new patient here with a complaint of recurrent or almost continuous aphthous ulcers.  Illness began in April with a mono-like illness that he describes as involving lymphadenopathy and flu like symptoms.  He subsequently developed aphthous ulcers that have been persistent and painful.  He did have a negative HSV culture by his report.  He denies fever.  His ulcers did respond to a course of prednisone but returned after stopping.  He now reports they are spreading to his pharynyx and giving him problems swallowing.  No history of Bechet's in his family, no GI history such as Crohn's or UC, no history of other rheumatologic illness.      Review of Systems  Constitutional: Positive for appetite change. Negative for fever and fatigue.  HENT: Positive for sore throat, mouth sores and trouble swallowing. Negative for voice change.   Eyes: Negative.   Respiratory: Negative.   Cardiovascular: Negative.   Gastrointestinal: Negative.   Genitourinary: Negative.   Musculoskeletal: Negative.   Skin: Negative.   Neurological: Negative.   Hematological: Negative.   Psychiatric/Behavioral: Negative.        Objective:   Physical Exam  Constitutional: He is oriented to person, place, and time. He appears well-developed and well-nourished.  HENT:       + multiple lesions on mucous membranes bilateral with a grey-ish base, painful, consistent with aphthous ulcers.    Eyes: No scleral icterus.  Cardiovascular: Normal rate, regular rhythm and normal heart sounds.   Pulmonary/Chest: Effort normal and breath sounds normal. No respiratory distress. He has no wheezes.  Abdominal: Soft. Bowel sounds are normal. He exhibits no distension. There is no tenderness.  Genitourinary: Penis normal. No penile tenderness.       No penile lesions  Musculoskeletal: Normal range of motion. He exhibits no edema.    Lymphadenopathy:    He has no cervical adenopathy.  Neurological: He is alert and oriented to person, place, and time.  Skin: Skin is warm and dry. No rash noted.  Psychiatric:       anxious          Assessment & Plan:

## 2010-10-19 NOTE — Assessment & Plan Note (Addendum)
Patient has exam consistent with aphthous ulcers and history is consistent with pharyngeal/esophageal spread.  DDx includes Bechet's, though less likely without genital lesions, GI etiology possible but also not likely, presentation with mono-like illness and ulcers more consistent with acute HIV.  Will check viral load and Ab, as well as celiac and ESR for ?other etiology.  If positive, will contact him and have the patient return, otherwise, patient may return PRN.  He may need GI evaluation regardless if continues to spread.    Addendum: results of acute HIV, GI etiology of aphthous ulcers negative.  Unclear why he gets these.  He may need periodic prednisone therapy.  No known benefit of nutrition changes.

## 2010-10-20 LAB — HIV-1 RNA QUANT-NO REFLEX-BLD
HIV 1 RNA Quant: <20 copies/mL (ref <20)
HIV-1 RNA Quant, Log: <1.3 {Log} (ref <1.30)

## 2010-10-20 LAB — GLIA (IGA/G) + TTG IGA: Gliadin IgG: 5.6 U/mL (ref <20)

## 2010-10-20 LAB — SEDIMENTATION RATE: Sed Rate: 5 mm/hr (ref 0–16)

## 2010-10-24 ENCOUNTER — Telehealth: Payer: Self-pay | Admitting: *Deleted

## 2010-10-24 NOTE — Telephone Encounter (Signed)
Returned pt call after reviewing lab work results, all negative or normal.  Message left.  Pt does not need to return to Center unless symptoms worsen or do not improve.  Jennet Maduro, RN.

## 2010-10-26 ENCOUNTER — Encounter: Payer: Self-pay | Admitting: Internal Medicine

## 2010-10-26 ENCOUNTER — Telehealth: Payer: Self-pay | Admitting: *Deleted

## 2010-10-26 NOTE — Telephone Encounter (Signed)
LM for him that all labs are normal

## 2010-12-08 ENCOUNTER — Emergency Department (HOSPITAL_COMMUNITY): Payer: Medicare Other

## 2010-12-08 ENCOUNTER — Emergency Department (HOSPITAL_COMMUNITY)
Admission: EM | Admit: 2010-12-08 | Discharge: 2010-12-08 | Disposition: A | Discharge status: Home / Self Care | Payer: Medicare Other | Attending: Emergency Medicine | Admitting: Emergency Medicine

## 2010-12-08 DIAGNOSIS — L989 Disorder of the skin and subcutaneous tissue, unspecified: Secondary | ICD-10-CM | POA: Insufficient documentation

## 2010-12-08 DIAGNOSIS — J40 Bronchitis, not specified as acute or chronic: Secondary | ICD-10-CM | POA: Insufficient documentation

## 2010-12-08 DIAGNOSIS — R05 Cough: Secondary | ICD-10-CM | POA: Insufficient documentation

## 2010-12-08 DIAGNOSIS — B9789 Other viral agents as the cause of diseases classified elsewhere: Secondary | ICD-10-CM | POA: Insufficient documentation

## 2010-12-08 DIAGNOSIS — R059 Cough, unspecified: Secondary | ICD-10-CM | POA: Insufficient documentation

## 2010-12-08 DIAGNOSIS — K1370 Unspecified lesions of oral mucosa: Secondary | ICD-10-CM | POA: Insufficient documentation

## 2010-12-08 LAB — COMPREHENSIVE METABOLIC PANEL
ALT: 17 U/L (ref 0–53)
AST: 16 U/L (ref 0–37)
Albumin: 4 g/dL (ref 3.5–5.2)
Alkaline Phosphatase: 105 U/L (ref 39–117)
Chloride: 100 mEq/L (ref 96–112)
Creatinine, Ser: 1 mg/dL (ref 0.50–1.35)
Potassium: 3.4 mEq/L — ABNORMAL LOW (ref 3.5–5.1)
Total Bilirubin: 0.1 mg/dL — ABNORMAL LOW (ref 0.3–1.2)
Total Protein: 7.5 g/dL (ref 6.0–8.3)

## 2010-12-08 LAB — DIFFERENTIAL
Basophils Absolute: 0 10*3/uL (ref 0.0–0.1)
Eosinophils Absolute: 0.4 10*3/uL (ref 0.0–0.7)
Eosinophils Relative: 4 % (ref 0–5)
Lymphocytes Relative: 18 % (ref 12–46)
Lymphs Abs: 2.1 10*3/uL (ref 0.7–4.0)
Monocytes Relative: 6 % (ref 3–12)
Neutro Abs: 8.2 10*3/uL — ABNORMAL HIGH (ref 1.7–7.7)
Neutrophils Relative %: 72 % (ref 43–77)

## 2010-12-08 LAB — URINALYSIS, ROUTINE W REFLEX MICROSCOPIC
Bilirubin Urine: NEGATIVE
Glucose, UA: NEGATIVE mg/dL
Hgb urine dipstick: NEGATIVE
Ketones, ur: NEGATIVE mg/dL
Protein, ur: NEGATIVE mg/dL

## 2010-12-08 LAB — CBC
MCHC: 34 g/dL (ref 30.0–36.0)
MCV: 91.6 fL (ref 78.0–100.0)
Platelets: 230 10*3/uL (ref 150–400)
RBC: 4.65 MIL/uL (ref 4.22–5.81)
RDW: 11.9 % (ref 11.5–15.5)
WBC: 11.4 10*3/uL — ABNORMAL HIGH (ref 4.0–10.5)

## 2010-12-08 LAB — POCT I-STAT, CHEM 8
BUN: 18 mg/dL (ref 6–23)
Calcium, Ion: 1.13 mmol/L (ref 1.12–1.32)
Creatinine, Ser: 1.1 mg/dL (ref 0.50–1.35)
Glucose, Bld: 100 mg/dL — ABNORMAL HIGH (ref 70–99)
HCT: 45 % (ref 39.0–52.0)
Hemoglobin: 15.3 g/dL (ref 13.0–17.0)
Potassium: 3.5 mEq/L (ref 3.5–5.1)

## 2010-12-08 LAB — URINE MICROSCOPIC-ADD ON

## 2010-12-08 LAB — SEDIMENTATION RATE: Sed Rate: 10 mm/hr (ref 0–16)

## 2010-12-22 LAB — DIFFERENTIAL
Basophils Relative: 0
Eosinophils Absolute: 0.2
Eosinophils Relative: 3
Monocytes Relative: 7
Neutrophils Relative %: 73

## 2010-12-22 LAB — CBC
HCT: 46.2
Hemoglobin: 16
MCHC: 34.6
MCV: 92.1
Platelets: 177
RBC: 5.01
RDW: 12.5
WBC: 6.9

## 2010-12-22 LAB — COMPREHENSIVE METABOLIC PANEL
ALT: 32
AST: 23
CO2: 20
Chloride: 104
Creatinine, Ser: 0.97
GFR calc Af Amer: >60
GFR calc non Af Amer: >60
Glucose, Bld: 112 — ABNORMAL HIGH
Total Bilirubin: 0.5
Total Protein: 6.8

## 2010-12-22 LAB — LIPASE, BLOOD: Lipase: 23

## 2011-01-02 LAB — CBC
Hemoglobin: 14.1
Hemoglobin: 15.5
MCHC: 33.5
MCHC: 33.8
MCV: 95.6
MCV: 95.6
Platelets: 153
Platelets: 162
Platelets: 168
RBC: 4.35
RBC: 4.84
RDW: 12.5
RDW: 12.8
WBC: 8
WBC: 9

## 2011-01-02 LAB — COMPREHENSIVE METABOLIC PANEL
ALT: 15
ALT: 16
ALT: 21
AST: 13
AST: 16
AST: 17
Albumin: 3.3 — ABNORMAL LOW
Albumin: 3.5
Albumin: 4
Alkaline Phosphatase: 52
Alkaline Phosphatase: 58
Alkaline Phosphatase: 75
BUN: 8
Calcium: 8.9
Calcium: 9.1
Chloride: 105
Chloride: 109
Creatinine, Ser: 1.05
GFR calc Af Amer: >60
GFR calc Af Amer: >60
GFR calc Af Amer: >60
GFR calc non Af Amer: >60
GFR calc non Af Amer: >60
Glucose, Bld: 59 — ABNORMAL LOW
Potassium: 3 — ABNORMAL LOW
Potassium: 3.5
Sodium: 139
Sodium: 141
Sodium: 141
Total Bilirubin: 0.6
Total Bilirubin: 0.7
Total Protein: 5.7 — ABNORMAL LOW
Total Protein: 5.8 — ABNORMAL LOW
Total Protein: 6.6

## 2011-01-02 LAB — URINALYSIS, ROUTINE W REFLEX MICROSCOPIC
Glucose, UA: NEGATIVE
Hgb urine dipstick: NEGATIVE
Ketones, ur: NEGATIVE
Protein, ur: NEGATIVE
Specific Gravity, Urine: 1.017
Urobilinogen, UA: 0.2

## 2011-01-02 LAB — RAPID URINE DRUG SCREEN, HOSP PERFORMED
Amphetamines: NOT DETECTED
Barbiturates: NOT DETECTED

## 2011-01-02 LAB — ETHANOL: Alcohol, Ethyl (B): 7

## 2011-01-02 LAB — PROTIME-INR
INR: 1
Prothrombin Time: 14.9
Prothrombin Time: 15

## 2011-01-02 LAB — POCT I-STAT 3, ART BLOOD GAS (G3+)
Bicarbonate: 21.7
Patient temperature: 97.5
TCO2: 23
pCO2 arterial: 32.3 — ABNORMAL LOW
pH, Arterial: 7.433
pO2, Arterial: 108 — ABNORMAL HIGH

## 2011-01-02 LAB — DIFFERENTIAL
Basophils Absolute: 0
Basophils Relative: 1
Eosinophils Absolute: 0.1
Eosinophils Relative: 1
Lymphs Abs: 1.1
Monocytes Absolute: 0.8
Monocytes Relative: 11
Neutro Abs: 5
Neutrophils Relative %: 71

## 2011-01-02 LAB — CARDIAC PANEL(CRET KIN+CKTOT+MB+TROPI)
CK, MB: 0.9
CK, MB: 1
Relative Index: INVALID
Relative Index: INVALID
Total CK: 58
Total CK: 65
Troponin I: 0.01
Troponin I: 0.01
Troponin I: <0.01

## 2011-01-02 LAB — CK: Total CK: 53

## 2011-01-02 LAB — LACTIC ACID, PLASMA: Lactic Acid, Venous: 1.7

## 2011-01-02 LAB — APTT: aPTT: 26

## 2011-01-02 LAB — KETONES, QUALITATIVE: Acetone, Bld: NEGATIVE

## 2011-01-02 LAB — ACETAMINOPHEN LEVEL
Acetaminophen (Tylenol), Serum: 102.6 — ABNORMAL HIGH
Acetaminophen (Tylenol), Serum: 63.5 — ABNORMAL HIGH

## 2011-01-02 LAB — OSMOLALITY: Osmolality: 293

## 2011-01-06 LAB — DIFFERENTIAL
Basophils Absolute: 0
Basophils Relative: 0
Eosinophils Absolute: 0 — ABNORMAL LOW
Eosinophils Relative: 0
Lymphocytes Relative: 18
Lymphs Abs: 0.9
Monocytes Absolute: 0.6
Monocytes Absolute: 0.7
Monocytes Relative: 5
Neutro Abs: 6.4

## 2011-01-06 LAB — COMPREHENSIVE METABOLIC PANEL
ALT: 24
AST: 17
Albumin: 4
Albumin: 4.2
Alkaline Phosphatase: 89
BUN: 12
CO2: 23
CO2: 24
Calcium: 9
Calcium: 9.5
Chloride: 107
GFR calc Af Amer: >60
GFR calc Af Amer: >60
GFR calc non Af Amer: >60
Glucose, Bld: 103 — ABNORMAL HIGH
Potassium: 3.3 — ABNORMAL LOW
Sodium: 138
Total Bilirubin: 0.5
Total Protein: 6.5

## 2011-01-06 LAB — CBC
HCT: 43.3
Hemoglobin: 14.7
Hemoglobin: 15.3
MCHC: 34.1
MCV: 92.5
Platelets: 205
RBC: 4.65
RBC: 4.76
RDW: 12.4
WBC: 8.8

## 2011-01-12 LAB — RAPID URINE DRUG SCREEN, HOSP PERFORMED
Amphetamines: NOT DETECTED
Barbiturates: NOT DETECTED
Benzodiazepines: NOT DETECTED
Cocaine: NOT DETECTED
Tetrahydrocannabinol: NOT DETECTED

## 2011-01-12 LAB — CBC
HCT: 42.3
MCV: 91.1
Platelets: 192
RDW: 12
WBC: 8

## 2011-01-12 LAB — URINALYSIS, ROUTINE W REFLEX MICROSCOPIC
Bilirubin Urine: NEGATIVE
Glucose, UA: NEGATIVE
Ketones, ur: NEGATIVE
Nitrite: NEGATIVE
Protein, ur: NEGATIVE

## 2011-01-12 LAB — DIFFERENTIAL
Basophils Relative: 0
Eosinophils Absolute: 0.1
Eosinophils Relative: 1
Lymphs Abs: 1.7
Monocytes Absolute: 0.6

## 2011-01-12 LAB — BASIC METABOLIC PANEL
BUN: 11
CO2: 24
Chloride: 101
Creatinine, Ser: 0.97
Glucose, Bld: 107 — ABNORMAL HIGH
Potassium: 3.3 — ABNORMAL LOW

## 2011-05-21 ENCOUNTER — Emergency Department (HOSPITAL_COMMUNITY): Payer: Medicare Other

## 2011-05-21 ENCOUNTER — Emergency Department (HOSPITAL_COMMUNITY)
Admission: EM | Admit: 2011-05-21 | Discharge: 2011-05-22 | Disposition: A | Discharge status: Home / Self Care | Payer: Medicare Other | Attending: Emergency Medicine | Admitting: Emergency Medicine

## 2011-05-21 ENCOUNTER — Other Ambulatory Visit: Payer: Self-pay

## 2011-05-21 ENCOUNTER — Encounter (HOSPITAL_COMMUNITY): Payer: Self-pay | Admitting: *Deleted

## 2011-05-21 DIAGNOSIS — R5381 Other malaise: Secondary | ICD-10-CM | POA: Insufficient documentation

## 2011-05-21 DIAGNOSIS — I10 Essential (primary) hypertension: Secondary | ICD-10-CM | POA: Insufficient documentation

## 2011-05-21 DIAGNOSIS — G43109 Migraine with aura, not intractable, without status migrainosus: Secondary | ICD-10-CM | POA: Insufficient documentation

## 2011-05-21 DIAGNOSIS — G40909 Epilepsy, unspecified, not intractable, without status epilepticus: Secondary | ICD-10-CM | POA: Insufficient documentation

## 2011-05-21 DIAGNOSIS — R000 Tachycardia, unspecified: Secondary | ICD-10-CM | POA: Insufficient documentation

## 2011-05-21 DIAGNOSIS — R209 Unspecified disturbances of skin sensation: Secondary | ICD-10-CM | POA: Insufficient documentation

## 2011-05-21 DIAGNOSIS — Z79899 Other long term (current) drug therapy: Secondary | ICD-10-CM | POA: Insufficient documentation

## 2011-05-21 HISTORY — DX: Essential (primary) hypertension: I10

## 2011-05-21 HISTORY — DX: Unspecified convulsions: R56.9

## 2011-05-21 LAB — CBC
MCH: 31.9 pg (ref 26.0–34.0)
MCV: 90.1 fL (ref 78.0–100.0)
Platelets: 196 10*3/uL (ref 150–400)
RBC: 4.74 MIL/uL (ref 4.22–5.81)
RDW: 11.9 % (ref 11.5–15.5)
WBC: 10.1 10*3/uL (ref 4.0–10.5)

## 2011-05-21 LAB — GLUCOSE, CAPILLARY: Glucose-Capillary: 68 mg/dL — ABNORMAL LOW (ref 70–99)

## 2011-05-21 LAB — URINALYSIS, ROUTINE W REFLEX MICROSCOPIC
Bilirubin Urine: NEGATIVE
Ketones, ur: NEGATIVE mg/dL
Leukocytes, UA: NEGATIVE
Nitrite: NEGATIVE
Specific Gravity, Urine: 1.006 (ref 1.005–1.030)
Urobilinogen, UA: 0.2 mg/dL (ref 0.0–1.0)
pH: 6.5 (ref 5.0–8.0)

## 2011-05-21 LAB — COMPREHENSIVE METABOLIC PANEL
ALT: 27 U/L (ref 0–53)
AST: 19 U/L (ref 0–37)
Alkaline Phosphatase: 100 U/L (ref 39–117)
CO2: 25 mEq/L (ref 19–32)
Chloride: 97 mEq/L (ref 96–112)
GFR calc Af Amer: >90 mL/min (ref >90)
GFR calc non Af Amer: >90 mL/min (ref >90)
Glucose, Bld: 98 mg/dL (ref 70–99)
Potassium: 3.3 mEq/L — ABNORMAL LOW (ref 3.5–5.1)
Sodium: 134 mEq/L — ABNORMAL LOW (ref 135–145)
Total Bilirubin: 0.2 mg/dL — ABNORMAL LOW (ref 0.3–1.2)

## 2011-05-21 LAB — DIFFERENTIAL
Basophils Absolute: 0 10*3/uL (ref 0.0–0.1)
Eosinophils Absolute: 0.2 10*3/uL (ref 0.0–0.7)
Lymphocytes Relative: 17 % (ref 12–46)
Lymphs Abs: 1.7 10*3/uL (ref 0.7–4.0)
Neutro Abs: 7.3 10*3/uL (ref 1.7–7.7)
Neutrophils Relative %: 72 % (ref 43–77)

## 2011-05-21 MED ORDER — SODIUM CHLORIDE 0.9 % IV BOLUS (SEPSIS)
1000.0000 mL | Freq: Once | INTRAVENOUS | Status: AC
  Administered 2011-05-21: 1000 mL via INTRAVENOUS

## 2011-05-21 MED ORDER — MORPHINE SULFATE 4 MG/ML IJ SOLN
4.0000 mg | Freq: Once | INTRAMUSCULAR | Status: AC
  Administered 2011-05-21: 4 mg via INTRAVENOUS
  Filled 2011-05-21: qty 1

## 2011-05-21 MED ORDER — ONDANSETRON HCL 4 MG/2ML IJ SOLN
4.0000 mg | Freq: Once | INTRAMUSCULAR | Status: AC
  Administered 2011-05-21: 4 mg via INTRAVENOUS
  Filled 2011-05-21: qty 2

## 2011-05-21 MED ORDER — LIDOCAINE HCL 1 % IJ SOLN
INTRAMUSCULAR | Status: AC
  Filled 2011-05-21: qty 20

## 2011-05-21 MED ORDER — SODIUM CHLORIDE 0.9 % IV SOLN
INTRAVENOUS | Status: DC
  Administered 2011-05-21: 21:00:00 via INTRAVENOUS

## 2011-05-21 NOTE — ED Notes (Signed)
Pt was eating pizza earlier tonight when his headache turned into weakness and his right arm and right side of his face started to feel numb.  Pt began to vomit profusely prior to triage while being registered.  Pt states "now I just dont feel well at all".

## 2011-05-21 NOTE — ED Provider Notes (Signed)
Screening exam performed in triage. Patient states he had a sudden headache this afternoon and developed tingling over the right side of his face and his arm. States that he feels like his grip strength is reduced in an arm and states "my arm won't do what I want it to do." Prior to triage, he began to vomit profusely. He does have a history of migraine, but states this does not quite feel similar; states the pain is not quite as severe, but seems more intense. He additionally has a history of seizures, which have been well controlled on Lamictal; he has not had a seizure in over 3 years here, and he has been compliant with his medications. States his current sensation does not feel like his typical auras. States "I just don't feel good."  Normal sensation on exam but reduced grip strength R. Slightly slow to answer questions.  Some concern for subarachnoid hemorrhage. Patient to be moved to bed in the back for further evaluation and treatment. Screening labs and CT ordered. Dr. Weldon Inches aware.  Grant Fontana, [place] 05/21/11 2059  Grant Fontana, [place] 05/21/11 2108

## 2011-05-21 NOTE — ED Notes (Signed)
Pt c/o R facial numbness and R hand numbness and significant weakness from L grip. Pt c/o nausea and headache.

## 2011-05-21 NOTE — ED Provider Notes (Signed)
History     CSN: [id number]  Arrival date & time 05/21/11  2011   First MD Initiated Contact with Patient 05/21/11 2048      Chief Complaint  Patient presents with  . Numbness    R face and arm  . Weakness    R side weakness in grip    (Consider location/radiation/quality/duration/timing/severity/associated sxs/prior treatment) Patient is a 31 y.o. male presenting with weakness. The history is provided by the patient and a parent. The history is limited by the condition of the patient.  Weakness The primary symptoms include headaches, nausea and vomiting. Primary symptoms do not include fever.  The headache is associated with weakness. The headache is not associated with neck stiffness.   Additional symptoms include weakness. Additional symptoms do not include neck stiffness.   the patient is a 31 year old, male, with a history of epilepsy, who presents to the emergency department complaining of sudden onset of headache, nausea, vomiting, numbness, and right side of his face and numbness in his right arm with right arm weakness.  His symptoms began around 3:00 this afternoon, while he was eating.  He states that typically, when he has a seizure.  He gets an RR, and this is nothing like his seizures.  He denies recent illness.  He denies the Versed, chills, cough, shortness of breath.  He denies neck pain.  He denies vision changes.  Chest pain, and abdominal pain.  His speech seems a little slow.  His mother is present and she is not able to characterize.  It well, but says it is different.  Level V caveat applies for urgent need for evaluation for suspected subarachnoid hemorrhage.    Past Medical History  Diagnosis Date  . Seizures     last experienced a seizure x 3 years ago  . Migraine   . Hypertension     Past Surgical History  Procedure Date  . Appendectomy   . Maxilofacial     History reviewed. No pertinent family history.  History  Substance Use Topics  . Smoking  status: Not on file  . Smokeless tobacco: Not on file  . Alcohol Use:       Review of Systems  Constitutional: Negative for fever and chills.  HENT: Negative for neck pain and neck stiffness.   Eyes: Negative for visual disturbance.  Respiratory: Negative for shortness of breath.   Cardiovascular: Negative for chest pain.  Gastrointestinal: Positive for nausea and vomiting. Negative for abdominal pain.  Genitourinary: Negative for dysuria and hematuria.  Musculoskeletal: Negative for back pain.  Neurological: Positive for weakness, numbness and headaches.  Psychiatric/Behavioral: Negative for confusion.  All other systems reviewed and are negative.    Allergies  Review of patient's allergies indicates no known allergies.  Home Medications   Current Outpatient Rx  Name Route Sig Dispense Refill  . ATENOLOL 50 MG PO TABS Oral Take 50 mg by mouth 2 (two) times daily.      Marland Kitchen CLONAZEPAM 0.5 MG PO TABS Oral Take 0.5 mg by mouth 2 (two) times daily as needed.      . DULOXETINE HCL 60 MG PO CPEP Oral Take 60 mg by mouth daily.      Marland Kitchen LAMOTRIGINE 100 MG PO TABS Oral Take 100 mg by mouth 2 (two) times daily.      Marland Kitchen LISDEXAMFETAMINE DIMESYLATE 50 MG PO CAPS Oral Take 50 mg by mouth every morning.      Marland Kitchen MIRTAZAPINE 15 MG PO TABS Oral  Take 15-30 mg by mouth at bedtime.      Marland Kitchen RISPERIDONE 1 MG PO TABS Oral Take 1 mg by mouth daily.      . TRAZODONE HCL 50 MG PO TABS Oral Take 50-100 mg by mouth at bedtime.        BP 132/85  Pulse 85  Temp(Src) 98.4 F (36.9 C) (Oral)  Resp 20  Ht 6\' 1"  (1.854 m)  Wt 220 lb (99.791 kg)  BMI 29.03 kg/m2  SpO2 98%  Physical Exam  Vitals reviewed. Constitutional: He is oriented to person, place, and time. He appears well-developed and well-nourished. No distress.       Slow speech, with subtle depression of alertness  HENT:  Head: Normocephalic and atraumatic.  Eyes: Right eye exhibits no discharge.       Pupils bilaterally.  6 mm  Neck:  Normal range of motion. Neck supple.       No nuchal rigidity  Cardiovascular: Regular rhythm.   No murmur heard.      Tachycardia  Pulmonary/Chest: Effort normal. No respiratory distress.  Abdominal: Soft. He exhibits no distension.  Musculoskeletal: Normal range of motion. He exhibits no edema and no tenderness.  Neurological: He is alert and oriented to person, place, and time. No cranial nerve deficit.       Left arm, and leg have normal strength.  Right lower extremity has normal strength.  Left upper extremity grip is 3/5, as well as biceps on the right side are 3/5  Skin: Skin is warm and dry.  Psychiatric: Thought content normal.    ED Course  LUMBAR PUNCTURE Date/Time: 05/22/2011 12:32 AM Performed by: Weldon Inches, Raydan Schlabach P Authorized by: Weldon Inches, Denea Cheaney P Consent: Written consent obtained. Patient understanding: patient states understanding of the procedure being performed Patient consent: the patient's understanding of the procedure matches consent given Procedure consent: procedure consent matches procedure scheduled Relevant documents: relevant documents present and verified Time out: Immediately prior to procedure a "time out" was called to verify the correct patient, procedure, equipment, support staff and site/side marked as required. Indications: evaluation for subarachnoid hemorrhage Anesthesia: local infiltration Local anesthetic: lidocaine 1% without epinephrine Patient sedated: no Preparation: Patient was prepped and draped in the usual sterile fashion. Lumbar space: L4-L5 interspace Patient's position: left lateral decubitus Needle gauge: 20 Needle length: 2.5 in Number of attempts: 2 Closing pressure: 14 cm H2O Fluid appearance: blood-tinged then clearing Tubes of fluid: 4 Total volume: 6 ml Post-procedure: site cleaned and adhesive bandage applied Patient tolerance: Patient tolerated the procedure well with no immediate complications.   (including  critical care time) 31 year old, male, with history of epilepsy, presents to emergency department with sudden onset of headache, nausea and vomiting, and weakness in his right upper extremity.  His mental status is clear.  At this time.  He has no nuchal rigidity.  We will perform a stat head CT and laboratory testing.  If the CT is negative.  We will proceed to do a lumbar puncture.   Labs Reviewed  CBC  DIFFERENTIAL  COMPREHENSIVE METABOLIC PANEL   No results found.   No diagnosis found.    MDM  Headache with nausea and vomiting, and right arm weakness        Nicholes Stairs, MD 05/22/11 [phone]

## 2011-05-22 LAB — CSF CELL COUNT WITH DIFFERENTIAL
Eosinophils, CSF: 0 % (ref 0–1)
Lymphs, CSF: 0 % — ABNORMAL LOW (ref 40–80)
Lymphs, CSF: 0 % — ABNORMAL LOW (ref 40–80)
Monocyte-Macrophage-Spinal Fluid: 0 % — ABNORMAL LOW (ref 15–45)
Monocyte-Macrophage-Spinal Fluid: 0 % — ABNORMAL LOW (ref 15–45)
Other Cells, CSF: 0
RBC Count, CSF: 2132 /mm3 — ABNORMAL HIGH
RBC Count, CSF: 3 /mm3 — ABNORMAL HIGH
Tube #: 1
Tube #: 4

## 2011-05-22 LAB — GRAM STAIN: Gram Stain: NONE SEEN

## 2011-05-22 MED ORDER — IBUPROFEN 800 MG PO TABS: 800.0000 mg | ORAL_TABLET | Freq: Three times a day (TID) | ORAL | Status: AC

## 2011-05-22 MED ORDER — HYDROMORPHONE HCL PF 1 MG/ML IJ SOLN
1.0000 mg | Freq: Once | INTRAMUSCULAR | Status: AC
  Administered 2011-05-22: 1 mg via INTRAVENOUS
  Filled 2011-05-22: qty 1

## 2011-05-22 MED ORDER — ONDANSETRON HCL 4 MG PO TABS: 4.0000 mg | ORAL_TABLET | Freq: Four times a day (QID) | ORAL | Status: AC

## 2011-05-22 NOTE — ED Provider Notes (Signed)
Medical screening examination/treatment/procedure(s) were performed by non-physician practitioner and as supervising physician I was immediately available for consultation/collaboration.   Dione Booze, MD 05/22/11 2251

## 2011-05-25 LAB — CSF CULTURE W GRAM STAIN: Gram Stain: NONE SEEN

## 2011-05-25 LAB — CSF CULTURE

## 2011-08-12 ENCOUNTER — Emergency Department (HOSPITAL_COMMUNITY)
Admission: EM | Admit: 2011-08-12 | Discharge: 2011-08-12 | Disposition: A | Discharge status: Home / Self Care | Payer: Medicare Other | Attending: Emergency Medicine | Admitting: Emergency Medicine

## 2011-08-12 ENCOUNTER — Emergency Department (HOSPITAL_COMMUNITY): Payer: Medicare Other

## 2011-08-12 ENCOUNTER — Encounter (HOSPITAL_COMMUNITY): Payer: Self-pay | Admitting: *Deleted

## 2011-08-12 DIAGNOSIS — M542 Cervicalgia: Secondary | ICD-10-CM | POA: Insufficient documentation

## 2011-08-12 DIAGNOSIS — I10 Essential (primary) hypertension: Secondary | ICD-10-CM | POA: Insufficient documentation

## 2011-08-12 DIAGNOSIS — R569 Unspecified convulsions: Secondary | ICD-10-CM | POA: Insufficient documentation

## 2011-08-12 DIAGNOSIS — Z79899 Other long term (current) drug therapy: Secondary | ICD-10-CM | POA: Insufficient documentation

## 2011-08-12 LAB — POCT I-STAT, CHEM 8
BUN: 16 mg/dL (ref 6–23)
Calcium, Ion: 1.21 mmol/L (ref 1.12–1.32)
Chloride: 105 mEq/L (ref 96–112)
Creatinine, Ser: 1.2 mg/dL (ref 0.50–1.35)
Sodium: 141 mEq/L (ref 135–145)
TCO2: 25 mmol/L (ref 0–100)

## 2011-08-12 MED ORDER — FENTANYL CITRATE 0.05 MG/ML IJ SOLN
100.0000 ug | Freq: Once | INTRAMUSCULAR | Status: AC
  Administered 2011-08-12: 100 ug via INTRAMUSCULAR
  Filled 2011-08-12: qty 2

## 2011-08-12 MED ORDER — FENTANYL CITRATE 0.05 MG/ML IJ SOLN
50.0000 ug | Freq: Once | INTRAMUSCULAR | Status: AC
  Administered 2011-08-12: 50 ug via INTRAMUSCULAR
  Filled 2011-08-12: qty 2

## 2011-08-12 MED ORDER — LORAZEPAM 1 MG PO TABS
2.0000 mg | ORAL_TABLET | Freq: Once | ORAL | Status: AC
  Administered 2011-08-12: 2 mg via ORAL
  Filled 2011-08-12: qty 2

## 2011-08-12 MED ORDER — OXYCODONE-ACETAMINOPHEN 5-325 MG PO TABS: 1.0000 | ORAL_TABLET | Freq: Four times a day (QID) | ORAL | Status: AC | PRN

## 2011-08-12 MED ORDER — LAMOTRIGINE 200 MG PO TABS
200.0000 mg | ORAL_TABLET | Freq: Once | ORAL | Status: AC
  Administered 2011-08-12: 200 mg via ORAL
  Filled 2011-08-12: qty 1

## 2011-08-12 NOTE — ED Notes (Signed)
EMS reports pt was at grocery store, had seizure, takes Lamictal, has been compliant with meds, still post ictal, LSB due to fall.  IV #18 L FA, CBG 111 ST on monitor, 2 L O2

## 2011-08-12 NOTE — Discharge Instructions (Signed)
Epilepsy  A seizure (convulsion) is a sudden change in brain function that causes a change in behavior, muscle activity, or ability to remain awake and alert. If a person has recurring seizures, this is called epilepsy.  CAUSES   Epilepsy is a disorder with many possible causes. Anything that disturbs the normal pattern of brain cell activity can lead to seizures. Seizure can be caused from illness to brain damage to abnormal brain development. Epilepsy may develop because of:   An abnormality in brain wiring.   An imbalance of nerve signaling chemicals (neurotransmitters).   Some combination of these factors.  Scientists are learning an increasing amount about genetic causes of seizures.  SYMPTOMS   The symptoms of a seizure can vary greatly from one person to another. These may include:   An aura, or warning that tells a person they are about to have a seizure.   Abnormal sensations, such as abnormal smell or seeing flashing lights.   Sudden, general body stiffness.   Rhythmic jerking of the face, arm, or leg - on one or both sides.   Sudden change in consciousness.   The person may appear to be awake but not responding.   They may appear to be asleep but cannot be awakened.   Grimacing, chewing, lip smacking, or drooling.   Often there is a period of sleepiness after a seizure.  DIAGNOSIS   The description you give to your caregiver about what you experienced will help them understand your problems. Equally important is the description by any witnesses to your seizure. A physical exam, including a detailed neurological exam, is necessary. An EEG (electroencephalogram) is a painless test of your brain waves. In this test a diagram is created of your brain waves. These diagrams can be interpreted by a specialist. Pictures of your brain are usually taken with:   An MRI.   A CT scan.  Lab tests may be done to look for:   Signs of infection.   Abnormal blood chemistry.  PREVENTION   There is no way to  prevent the development of epilepsy. If you have seizures that are typically triggered by an event (such as flashing lights), try to avoid the trigger. This can help you avoid a seizure.   PROGNOSIS   Most people with epilepsy lead outwardly normal lives. While epilepsy cannot currently be cured, for some people it does eventually go away. Most seizures do not cause brain damage. It is not uncommon for people with epilepsy, especially children, to develop behavioral and emotional problems. These problems are sometimes the consequence of medicine for seizures or social stress. For some people with epilepsy, the risk of seizures restricts their independence and recreational activities. For example, some states refuse drivers licenses to people with epilepsy.  Most women with epilepsy can become pregnant. They should discuss their epilepsy and the medicine they are taking with their caregivers. Women with epilepsy have a 90 percent or better chance of having a normal, healthy baby.  RISKS AND COMPLICATIONS   People with epilepsy are at increased risk of falls, accidents, and injuries. People with epilepsy are at special risk for two life-threatening conditions. These are status epilepticus and sudden unexplained death (extremely rare). Status epilepticus is a long lasting, continuous seizure that is a medical emergency.  TREATMENT   Once epilepsy is diagnosed, it is important to begin treatment as soon as possible. For about 80 percent of those diagnosed with epilepsy, seizures can be controlled with modern medicines   FDA approved a pacemaker for the brain the (vagus nerve stimulator). This stimulator can be used for people with seizures that are not well-controlled by medicine. Studies have shown that in some cases, children may experience fewer seizures if they maintain a  strict diet. The strict diet is called the ketogenic diet. This diet is rich in fats and low in carbohydrates. HOME CARE INSTRUCTIONS   Your caregiver will make recommendations about driving and safety in normal activities. Follow these carefully.   Take any medicine prescribed exactly as directed.   Do any blood tests requested to monitor the levels of your medicine.   The people you live and work with should know that you are prone to seizures. They should receive instructions on how to help you. In general, a witness to a seizure should:   Cushion your head and body.   Turn you on your side.   Avoid unnecessarily restraining you.   Not place anything inside your mouth.   Call for local emergency medical help if there is any question about what has occurred.   Keep a seizure diary. Record what you recall about any seizure, especially any possible trigger.   If your caregiver has given you a follow-up appointment, it is very important to keep that appointment. Not keeping the appointment could result in permanent injury and disability. If there is any problem keeping the appointment, you must call back to this facility for assistance.  SEEK MEDICAL CARE IF:   You develop signs of infection or other illness. This might increase the risk of a seizure.   You seem to be having more frequent seizures.   Your seizure pattern is changing.  SEEK IMMEDIATE MEDICAL CARE IF:   A seizure does not stop after a few moments.   A seizure causes any difficulty in breathing.   A seizure results in a very severe headache.   A seizure leaves you with the inability to speak or use a part of your body.  MAKE SURE YOU:   Understand these instructions.   Will watch your condition.   Will get help right away if you are not doing well or get worse.  Document Released: 03/20/2005 Document Revised: 03/09/2011 Document Reviewed: 10/25/2007 [facility] Patient Information [address],  [place].  RESOURCE GUIDE  Dental Problems  Patients with Medicaid: [facility] [phone] [address]                                           [phone] [address] Phone:  [phone]                                                   Phone:  [phone]  If unable to pay or uninsured, contact:  [facility] or [facility]. to become qualified for the adult dental clinic.  Chronic Pain Problems Contact [name]  [phone] Patients need to be referred by their primary care doctor.  Insufficient Money for Medicine Contact [employer] Way:  call "211" or [facility] [phone]) [phone].  No Primary Care Doctor Call  [facility]  [phone] Other agencies that provide inexpensive medical care    [facility]  [phone]    [facility]  [phone]    [facility]  [phone]    [facility]  [phone]    [facility]  [phone]    [facility]  [phone]  Psychological Services [facility]  [phone] [facility]  [phone] [facility]   [phone] (emergency services [phone])  Abuse/Neglect [facility] Child Abuse Hotline 520-877-9418 [facility] Child Abuse Hotline 480-422-0833 (After Hours)  Emergency Shelter [facility] [phone]  Maternity Homes [facility] [phone] [facility] [phone]  MRSA Hotline #:   726 321 5338    [facility] Resources  [facility]  [employer]                           [facility] [address] [place]                     [address]         [address]  Blondell Reveal Phone:  [phone]                                  Phone:  [phone]                   Phone:   [phone]  [facility] Phone:  [phone]  [facility] Child Abuse Hotline [phone] (After Hours)

## 2011-08-12 NOTE — ED Provider Notes (Signed)
History     CSN: [id number]  Arrival date & time 08/12/11  1542   First MD Initiated Contact with Patient 08/12/11 1604      Chief Complaint  Patient presents with  . Seizures   Level 5 caveat AMS  (Consider location/radiation/quality/duration/timing/severity/associated sxs/prior treatment) HPI  30yoM history of seizure disorder presents after seizure. The history is given mostly by the patient's mother who is awake as to the events of today. Patient is confused at this time. Per his mother he has a history of seizure disorder. He takes Lamictal 200 mg twice a day. She states that just prior to arrival there at the grocery store when he fell down, striking his head on the ground. He began to have 5 minutes of what appeared to be generalized tonic-clonic seizure that time. Mother states that that is his typical seizure. He was confused upon awakening. And complaining of right shoulder pain, neck pain. The patient was boarded and collared in transport to the emergency department for further workup and evaluation. He remains confused. He does complain of neck pain, back pain. He denies headache, dizziness. He denies numbness, tingling, weakness of his extremities. Nice recent illness. He has been compliant with his Lamictal per his mother.   ED Notes, ED Provider Notes from 08/12/11 0000 to 08/12/11 16:05:19       Patty Gerhard Perches, RN 08/12/2011 15:52      EMS reports pt was at grocery store, had seizure, takes Lamictal, has been compliant with meds, still post ictal, LSB due to fall. IV #18 L FA, CBG 111 ST on monitor, 2 L O2     Past Medical History  Diagnosis Date  . Seizures     last experienced a seizure x 3 years ago  . Migraine   . Hypertension     Past Surgical History  Procedure Date  . Appendectomy   . Maxilofacial     No family history on file.  History  Substance Use Topics  . Smoking status: Not on file  . Smokeless tobacco: Not on file  . Alcohol Use:        Review of Systems  Unable to perform ROS: Mental status change    Allergies  Review of patient's allergies indicates no known allergies.  Home Medications   Current Outpatient Rx  Name Route Sig Dispense Refill  . ACETAMINOPHEN 500 MG PO TABS Oral Take 500 mg by mouth every 6 (six) hours as needed. For pain relief    . ATENOLOL 50 MG PO TABS Oral Take 50 mg by mouth 2 (two) times daily.      Marland Kitchen CLONAZEPAM 0.5 MG PO TABS Oral Take 0.5 mg by mouth 2 (two) times daily as needed. For anxiety    . DULOXETINE HCL 60 MG PO CPEP Oral Take 60 mg by mouth daily.      Marland Kitchen LAMOTRIGINE 100 MG PO TABS Oral Take 100 mg by mouth 2 (two) times daily.      Marland Kitchen LISDEXAMFETAMINE DIMESYLATE 50 MG PO CAPS Oral Take 50 mg by mouth every morning.      Marland Kitchen MIRTAZAPINE 15 MG PO TABS Oral Take 15-30 mg by mouth at bedtime.      Marland Kitchen RISPERIDONE 1 MG PO TABS Oral Take 1 mg by mouth daily.      . TRAZODONE HCL 50 MG PO TABS Oral Take 50-100 mg by mouth at bedtime.      . OXYCODONE-ACETAMINOPHEN 5-325 MG PO TABS Oral Take  1 tablet by mouth every 6 (six) hours as needed for pain. 20 tablet 0    BP 139/83  Pulse 94  Temp(Src) 97.1 F (36.2 C) (Oral)  Resp 20  SpO2 99%  Physical Exam  Nursing note and vitals reviewed. Constitutional: He appears well-developed and well-nourished. No distress.       c collar and backboard in place  HENT:  Head: Atraumatic.  Mouth/Throat: Oropharynx is clear and moist.  Eyes: Conjunctivae are normal. Pupils are equal, round, and reactive to light.  Neck: Neck supple.       Midline C2/3 ttp  Cardiovascular: Regular rhythm, normal heart sounds and intact distal pulses.  Exam reveals no gallop and no friction rub.   No murmur heard.      tachycardic  Pulmonary/Chest: Effort normal. No respiratory distress. He has no wheezes. He has no rales.  Abdominal: Soft. Bowel sounds are normal. There is no tenderness. There is no rebound and no guarding.  Musculoskeletal: Normal range of  motion. He exhibits no edema and no tenderness.       R shoulder without obvious deformity. Diffuse ttp. No clavicle ttp. Limited ROM 2/2 pain. Radial pulse intact. Grip strength 5/5  No midline thoracic or lumbar ttp  Neurological: He is alert. No cranial nerve deficit. He exhibits normal muscle tone. Coordination normal.       Oriented x 1  Skin: Skin is warm and dry.    Date: 08/12/2011  Rate: 120  Rhythm: sinus tachycardia  QRS Axis: normal  Intervals: normal  ST/T Wave abnormalities: nonspecific ST changes  Conduction Disutrbances:none  Narrative Interpretation:   Old EKG Reviewed: changes noted   ED Course  Procedures (including critical care time)  Labs Reviewed  POCT I-STAT, CHEM 8 - Abnormal; Notable for the following:    Glucose, Bld 101 (*)    All other components within normal limits   Dg Shoulder Right  08/12/2011  *RADIOLOGY REPORT*  Clinical Data: Right shoulder injury  RIGHT SHOULDER - 2+ VIEW  Comparison: None.  Findings: Three views of the right shoulder submitted.  No acute fracture or subluxation.  No radiopaque foreign body.  IMPRESSION: No acute fracture or subluxation.  Original Report Authenticated By: Natasha Mead, M.D.   Ct Head Wo Contrast  08/12/2011  *RADIOLOGY REPORT*  Clinical Data:  Seizure.  Head trauma.  Neck pain.  CT HEAD WITHOUT CONTRAST CT CERVICAL SPINE WITHOUT CONTRAST  Technique:  Multidetector CT imaging of the head and cervical spine was performed following the standard protocol without intravenous contrast.  Multiplanar CT image reconstructions of the cervical spine were also generated.  Comparison:   None  CT HEAD  Findings: No acute intracranial abnormality is present. Specifically, there is no evidence for acute infarct, hemorrhage, mass, hydrocephalus, or extra-axial fluid collection.  The paranasal sinuses and mastoid air cells are clear.  The globes and orbits are intact.  The osseous skull is intact.  IMPRESSION: Negative CT of the head.   CT CERVICAL SPINE  Findings:  The cervical spine is imaged from skull base through T3.  The vertebral body heights and alignment maintained.  No acute fracture or traumatic subluxation is evident.  The soft tissues are unremarkable.  The lung apices are clear.  IMPRESSION: Negative cervical spine.  Original Report Authenticated By: Jamesetta Orleans. MATTERN, M.D.   Ct Cervical Spine Wo Contrast  08/12/2011  *RADIOLOGY REPORT*  Clinical Data:  Seizure.  Head trauma.  Neck pain.  CT HEAD WITHOUT CONTRAST  CT CERVICAL SPINE WITHOUT CONTRAST  Technique:  Multidetector CT imaging of the head and cervical spine was performed following the standard protocol without intravenous contrast.  Multiplanar CT image reconstructions of the cervical spine were also generated.  Comparison:   None  CT HEAD  Findings: No acute intracranial abnormality is present. Specifically, there is no evidence for acute infarct, hemorrhage, mass, hydrocephalus, or extra-axial fluid collection.  The paranasal sinuses and mastoid air cells are clear.  The globes and orbits are intact.  The osseous skull is intact.  IMPRESSION: Negative CT of the head.  CT CERVICAL SPINE  Findings:  The cervical spine is imaged from skull base through T3.  The vertebral body heights and alignment maintained.  No acute fracture or traumatic subluxation is evident.  The soft tissues are unremarkable.  The lung apices are clear.  IMPRESSION: Negative cervical spine.  Original Report Authenticated By: Jamesetta Orleans. MATTERN, M.D.    1. Seizure     MDM  H/O seizure d/o pw seizure, assoc BHT and shoulder pain. ED w/u unremarkable. Given dose of lamictal and ativan. Monitored until back to baseline. No further seizure like activity. Encouraged to take lamictal as prescribed and f/u with his neurologist. Given percocet for pain. Ambulated to baseline in the ED. Discharged home with his mother.        Forbes Cellar, MD 08/12/11 2125

## 2011-08-12 NOTE — ED Notes (Signed)
Pt able to ambulate without assistance. Pt c/o generalized "ache" and "minor weakness". Pt has no c/o feeling dizzy or faint.

## 2012-02-06 ENCOUNTER — Encounter (HOSPITAL_COMMUNITY): Payer: Self-pay | Admitting: Emergency Medicine

## 2012-02-06 ENCOUNTER — Emergency Department (HOSPITAL_COMMUNITY)
Admission: EM | Admit: 2012-02-06 | Discharge: 2012-02-07 | Disposition: A | Discharge status: Home / Self Care | Payer: Medicare Other | Attending: Emergency Medicine | Admitting: Emergency Medicine

## 2012-02-06 DIAGNOSIS — R350 Frequency of micturition: Secondary | ICD-10-CM | POA: Insufficient documentation

## 2012-02-06 DIAGNOSIS — H81399 Other peripheral vertigo, unspecified ear: Secondary | ICD-10-CM | POA: Insufficient documentation

## 2012-02-06 DIAGNOSIS — I10 Essential (primary) hypertension: Secondary | ICD-10-CM | POA: Insufficient documentation

## 2012-02-06 DIAGNOSIS — Z8669 Personal history of other diseases of the nervous system and sense organs: Secondary | ICD-10-CM | POA: Insufficient documentation

## 2012-02-06 DIAGNOSIS — Z79899 Other long term (current) drug therapy: Secondary | ICD-10-CM | POA: Insufficient documentation

## 2012-02-06 LAB — GLUCOSE, CAPILLARY: Glucose-Capillary: 59 mg/dL — ABNORMAL LOW (ref 70–99)

## 2012-02-06 MED ORDER — MECLIZINE HCL 25 MG PO TABS
50.0000 mg | ORAL_TABLET | Freq: Once | ORAL | Status: AC
  Administered 2012-02-06: 50 mg via ORAL
  Filled 2012-02-06: qty 2

## 2012-02-06 MED ORDER — SODIUM CHLORIDE 0.9 % IV SOLN
1000.0000 mL | Freq: Once | INTRAVENOUS | Status: AC
  Administered 2012-02-06: 1000 mL via INTRAVENOUS

## 2012-02-06 MED ORDER — METOCLOPRAMIDE HCL 5 MG/ML IJ SOLN
10.0000 mg | Freq: Once | INTRAMUSCULAR | Status: AC
  Administered 2012-02-06: 10 mg via INTRAVENOUS
  Filled 2012-02-06: qty 2

## 2012-02-06 MED ORDER — SODIUM CHLORIDE 0.9 % IV SOLN
1000.0000 mL | Freq: Once | INTRAVENOUS | Status: AC
  Administered 2012-02-07: 1000 mL via INTRAVENOUS

## 2012-02-06 MED ORDER — SODIUM CHLORIDE 0.9 % IV SOLN
1000.0000 mL | INTRAVENOUS | Status: DC
  Administered 2012-02-07: 1000 mL via INTRAVENOUS

## 2012-02-06 NOTE — ED Provider Notes (Signed)
History     CSN: [id number] rrival date & time 02/06/12  2213 First MD Initiated Contact with Patient 02/06/12 2231     Chief Complaint  Patient presents with  . Emesis     Patient is a 31 y.o. male presenting with vomiting. The history is provided by the patient.  Emesis  This is a new problem. The current episode started 3 to 5 hours ago. The problem occurs more than 10 times per day. The problem has not changed since onset.The emesis has an appearance of stomach contents. There has been no fever. Pertinent negatives include no abdominal pain, no chills, no cough, no diarrhea, no fever, no headaches, no sweats and no URI.   associated with the vomiting, he has had disequilibrium and a room spinning sensation. It worsens whenever he moves his head. He has also noticed some urinary frequency. He denies any abdominal pain or diarrhea. He's never had anything like this previously.  She does complain of general malaise  Past Medical History  Diagnosis Date  . Seizures     last experienced a seizure x 3 years ago  . Migraine   . Hypertension     Past Surgical History  Procedure Date  . Appendectomy   . Maxilofacial     No family history on file.  History  Substance Use Topics  . Smoking status: Not on file  . Smokeless tobacco: Not on file  . Alcohol Use:       Review of Systems  Constitutional: Negative for fever and chills.  Respiratory: Negative for cough.   Gastrointestinal: Positive for vomiting. Negative for abdominal pain and diarrhea.  Genitourinary: Positive for frequency.  Neurological: Negative for headaches.  All other systems reviewed and are negative.    Allergies  Review of patient's allergies indicates no known allergies.  Home Medications   Current Outpatient Rx  Name  Route  Sig  Dispense  Refill  . CLONAZEPAM 0.5 MG PO TABS   Oral   Take 0.5 mg by mouth 2 (two) times daily as needed. For anxiety         . DULOXETINE HCL 60 MG PO CPEP  Oral   Take 60 mg by mouth daily.           Marland Kitchen LAMOTRIGINE 100 MG PO TABS   Oral   Take 100 mg by mouth 2 (two) times daily.           Marland Kitchen LISDEXAMFETAMINE DIMESYLATE 50 MG PO CAPS   Oral   Take 50 mg by mouth every morning.             BP 154/79  Pulse 105  Temp 98 F (36.7 C) (Oral)  Resp 16  Wt 220 lb (99.791 kg)  SpO2 99%  Physical Exam  Nursing note and vitals reviewed. Constitutional: He is oriented to person, place, and time. He appears distressed.  HENT:  Head: Normocephalic and atraumatic.  Right Ear: Tympanic membrane, external ear and ear canal normal.  Left Ear: Tympanic membrane, external ear and ear canal normal.  Mouth/Throat: Oropharynx is clear and moist.  Eyes: Conjunctivae normal are normal. Right eye exhibits no discharge. Left eye exhibits no discharge. No scleral icterus.  Neck: Neck supple. No tracheal deviation present.  Cardiovascular: Normal rate, regular rhythm and intact distal pulses.   Pulmonary/Chest: Effort normal and breath sounds normal. No stridor. No respiratory distress. He has no wheezes. He has no rales.  Abdominal: Soft. Bowel sounds are normal.  He exhibits no distension. There is no tenderness. There is no rebound and no guarding.       Actively vomiting  Musculoskeletal: He exhibits no edema and no tenderness.  Neurological: He is alert and oriented to person, place, and time. He has normal strength. No cranial nerve deficit ( no gross defecits noted) or sensory deficit. He exhibits normal muscle tone. He displays no seizure activity. Coordination normal.       No pronator drift bilateral upper extrem, able to hold both legs off bed for 5 seconds, sensation intact in all extremities, no visual field cuts, no left or right sided neglect  Skin: Skin is warm and dry. No rash noted. He is not diaphoretic.  Psychiatric: He has a normal mood and affect.    ED Course  Procedures (including critical care time)   Labs Reviewed  CBC  WITH DIFFERENTIAL  BASIC METABOLIC PANEL  URINALYSIS, ROUTINE W REFLEX MICROSCOPIC   No results found.  MDM  ?peripheral vertigo.  With his polyuria, will check ua,  cbc , and bmet.    Pt denies recent medication change.  Will reassess after medications given.  Dr Ranae Palms will follow up.       Celene Kras, MD 02/06/12 [phone]

## 2012-02-06 NOTE — ED Notes (Signed)
Pt alert, arrives from home, c/o nausea and emesis, onset this evening, denies recent illness, resp even unlabored, skin pwd

## 2012-02-07 LAB — CBC WITH DIFFERENTIAL/PLATELET
Basophils Relative: 0 % (ref 0–1)
Eosinophils Relative: 1 % (ref 0–5)
HCT: 43.8 % (ref 39.0–52.0)
Lymphs Abs: 1.3 10*3/uL (ref 0.7–4.0)
MCH: 31.3 pg (ref 26.0–34.0)
MCV: 90.9 fL (ref 78.0–100.0)
Monocytes Absolute: 0.7 10*3/uL (ref 0.1–1.0)
Monocytes Relative: 7 % (ref 3–12)
Neutro Abs: 7.9 10*3/uL — ABNORMAL HIGH (ref 1.7–7.7)
RBC: 4.82 MIL/uL (ref 4.22–5.81)
RDW: 12.1 % (ref 11.5–15.5)
WBC: 10 10*3/uL (ref 4.0–10.5)

## 2012-02-07 LAB — URINALYSIS, ROUTINE W REFLEX MICROSCOPIC
Bilirubin Urine: NEGATIVE
Glucose, UA: NEGATIVE mg/dL
Hgb urine dipstick: NEGATIVE
Ketones, ur: NEGATIVE mg/dL
pH: 6.5 (ref 5.0–8.0)

## 2012-02-07 LAB — BASIC METABOLIC PANEL
BUN: 13 mg/dL (ref 6–23)
CO2: 25 mEq/L (ref 19–32)
Calcium: 9.6 mg/dL (ref 8.4–10.5)
Chloride: 98 mEq/L (ref 96–112)
Creatinine, Ser: 0.89 mg/dL (ref 0.50–1.35)

## 2012-02-07 MED ORDER — LORAZEPAM 1 MG PO TABS: 1.0000 mg | ORAL_TABLET | Freq: Three times a day (TID) | ORAL | Status: DC | PRN

## 2012-02-07 MED ORDER — ONDANSETRON HCL 4 MG/2ML IJ SOLN
4.0000 mg | Freq: Once | INTRAMUSCULAR | Status: AC
  Administered 2012-02-07: 4 mg via INTRAVENOUS
  Filled 2012-02-07: qty 2

## 2012-02-07 MED ORDER — MECLIZINE HCL 50 MG PO TABS: 50.0000 mg | ORAL_TABLET | Freq: Three times a day (TID) | ORAL | Status: DC | PRN

## 2012-02-07 MED ORDER — ONDANSETRON HCL 4 MG PO TABS: 4.0000 mg | ORAL_TABLET | Freq: Four times a day (QID) | ORAL | Status: DC

## 2012-02-07 MED ORDER — LORAZEPAM 2 MG/ML IJ SOLN
1.0000 mg | Freq: Once | INTRAMUSCULAR | Status: AC
  Administered 2012-02-07: 01:00:00 via INTRAVENOUS
  Filled 2012-02-07: qty 1

## 2012-02-07 NOTE — ED Notes (Signed)
Discharge instructions reviewed. Rx given x3. All questions answered. 

## 2012-02-07 NOTE — ED Notes (Signed)
Patient states he is feeling much better. Denies nausea at this time. PO fluids given. Patient states concern over amount of UOP.

## 2012-02-08 ENCOUNTER — Other Ambulatory Visit: Payer: Self-pay | Admitting: Otolaryngology

## 2012-02-08 DIAGNOSIS — R42 Dizziness and giddiness: Secondary | ICD-10-CM

## 2012-02-15 ENCOUNTER — Ambulatory Visit
Admission: RE | Admit: 2012-02-15 | Discharge: 2012-02-15 | Disposition: A | Discharge status: Home / Self Care | Payer: 59 | Source: Ambulatory Visit | Attending: Otolaryngology | Admitting: Otolaryngology

## 2012-02-15 DIAGNOSIS — R42 Dizziness and giddiness: Secondary | ICD-10-CM

## 2012-02-15 MED ORDER — GADOBENATE DIMEGLUMINE 529 MG/ML IV SOLN
19.0000 mL | Freq: Once | INTRAVENOUS | Status: AC | PRN
  Administered 2012-02-15: 19 mL via INTRAVENOUS

## 2012-05-07 ENCOUNTER — Emergency Department (HOSPITAL_COMMUNITY)
Admission: EM | Admit: 2012-05-07 | Discharge: 2012-05-08 | Disposition: A | Discharge status: Home / Self Care | Payer: Medicare Other | Attending: Emergency Medicine | Admitting: Emergency Medicine

## 2012-05-07 ENCOUNTER — Encounter (HOSPITAL_COMMUNITY): Payer: Self-pay | Admitting: Pharmacy Technician

## 2012-05-07 DIAGNOSIS — B349 Viral infection, unspecified: Secondary | ICD-10-CM

## 2012-05-07 DIAGNOSIS — R6883 Chills (without fever): Secondary | ICD-10-CM | POA: Insufficient documentation

## 2012-05-07 DIAGNOSIS — R109 Unspecified abdominal pain: Secondary | ICD-10-CM | POA: Insufficient documentation

## 2012-05-07 DIAGNOSIS — I10 Essential (primary) hypertension: Secondary | ICD-10-CM | POA: Insufficient documentation

## 2012-05-07 DIAGNOSIS — Z79899 Other long term (current) drug therapy: Secondary | ICD-10-CM | POA: Insufficient documentation

## 2012-05-07 DIAGNOSIS — R197 Diarrhea, unspecified: Secondary | ICD-10-CM | POA: Insufficient documentation

## 2012-05-07 DIAGNOSIS — G40909 Epilepsy, unspecified, not intractable, without status epilepticus: Secondary | ICD-10-CM | POA: Insufficient documentation

## 2012-05-07 DIAGNOSIS — Z8679 Personal history of other diseases of the circulatory system: Secondary | ICD-10-CM | POA: Insufficient documentation

## 2012-05-07 DIAGNOSIS — B9789 Other viral agents as the cause of diseases classified elsewhere: Secondary | ICD-10-CM | POA: Insufficient documentation

## 2012-05-07 LAB — POCT I-STAT, CHEM 8
BUN: 18 mg/dL (ref 6–23)
Chloride: 108 mEq/L (ref 96–112)
Glucose, Bld: 101 mg/dL — ABNORMAL HIGH (ref 70–99)
Potassium: 3.7 mEq/L (ref 3.5–5.1)
Sodium: 140 mEq/L (ref 135–145)
TCO2: 24 mmol/L (ref 0–100)

## 2012-05-07 MED ORDER — GI COCKTAIL ~~LOC~~
30.0000 mL | Freq: Once | ORAL | Status: AC
  Administered 2012-05-08: 30 mL via ORAL
  Filled 2012-05-07: qty 30

## 2012-05-07 MED ORDER — SODIUM CHLORIDE 0.9 % IV BOLUS (SEPSIS)
1000.0000 mL | Freq: Once | INTRAVENOUS | Status: AC
  Administered 2012-05-07: 1000 mL via INTRAVENOUS

## 2012-05-07 MED ORDER — PANTOPRAZOLE SODIUM 40 MG IV SOLR
40.0000 mg | Freq: Once | INTRAVENOUS | Status: AC
  Administered 2012-05-08: 40 mg via INTRAVENOUS
  Filled 2012-05-07: qty 40

## 2012-05-07 MED ORDER — MORPHINE SULFATE 4 MG/ML IJ SOLN
4.0000 mg | Freq: Once | INTRAMUSCULAR | Status: AC
  Administered 2012-05-08: 4 mg via INTRAVENOUS
  Filled 2012-05-07: qty 1

## 2012-05-07 MED ORDER — ONDANSETRON HCL 4 MG/2ML IJ SOLN
4.0000 mg | Freq: Once | INTRAMUSCULAR | Status: AC
  Administered 2012-05-08: 4 mg via INTRAVENOUS
  Filled 2012-05-07: qty 2

## 2012-05-07 NOTE — ED Notes (Signed)
Pt sent from Urgent Care. Pt c/o n/v/d with abdominal pain since yesterday. Pt denies URI symptoms. Pt states abdominal pain is constant and feels like stabbing. Pt ambulatory to exam room with steady gait. Pt a/o x 3. Skin warm, dry.

## 2012-05-07 NOTE — ED Provider Notes (Signed)
History     CSN: [id number]  Arrival date & time 05/07/12  2133   First MD Initiated Contact with Patient 05/07/12 2257      Chief Complaint  Patient presents with  . N/V/D    HPI  History provided by the patient. Patient is a 32 year old male past history of epilepsy and hypertension who presents with complaints of acute onset nausea, vomiting diarrhea symptoms. Patient reports developing nausea vomiting diarrhea symptoms yesterday and early this morning. He reports having between 7-10 loose watery stools without blood or mucus. He also reports similar amounts of vomiting. On some of more recent episodes he noticed small specks of bright red blood. Denies any throat or chest pains. He has some diffuse abdominal cramping occasionally greater in the epigastric area. Patient has not used any medication to help with symptoms. He denies any known sick contacts. Denies any recent travel. Denies any unusual or undercooked foods. He denies any fevers but reports some chills.     Past Medical History  Diagnosis Date  . Seizures     last experienced a seizure x 3 years ago  . Migraine   . Hypertension     Past Surgical History  Procedure Date  . Appendectomy   . Maxilofacial     No family history on file.  History  Substance Use Topics  . Smoking status: Not on file  . Smokeless tobacco: Not on file  . Alcohol Use:       Review of Systems  Constitutional: Positive for chills. Negative for fever.  Respiratory: Negative for cough and shortness of breath.   Gastrointestinal: Positive for nausea, vomiting, abdominal pain and diarrhea. Negative for constipation and blood in stool.  Genitourinary: Negative for dysuria, hematuria and flank pain.  All other systems reviewed and are negative.    Allergies  Review of patient's allergies indicates no known allergies.  Home Medications   Current Outpatient Rx  Name  Route  Sig  Dispense  Refill  . AMPHETAMINE-DEXTROAMPHETAMINE  20 MG PO TABS   Oral   Take 20 mg by mouth daily.         Marland Kitchen CLONAZEPAM 0.5 MG PO TABS   Oral   Take 0.5 mg by mouth 2 (two) times daily as needed. For anxiety         . DESVENLAFAXINE SUCCINATE ER 50 MG PO TB24   Oral   Take 50 mg by mouth daily.         Marland Kitchen LAMOTRIGINE 100 MG PO TABS   Oral   Take 100 mg by mouth 2 (two) times daily.           Marland Kitchen LISDEXAMFETAMINE DIMESYLATE 50 MG PO CAPS   Oral   Take 50 mg by mouth every morning.           Marland Kitchen QUETIAPINE FUMARATE 300 MG PO TABS   Oral   Take 300 mg by mouth at bedtime.         . ATENOLOL 50 MG PO TABS   Oral   Take 50 mg by mouth 2 (two) times daily.           BP 151/74  Pulse 113  Temp 98.3 F (36.8 C) (Oral)  Resp 20  SpO2 100%  Physical Exam  Nursing note and vitals reviewed. Constitutional: He is oriented to person, place, and time. He appears well-developed and well-nourished. No distress.  HENT:  Head: Normocephalic.  Neck: Normal range of motion. Neck supple.  Cardiovascular: Normal rate and regular rhythm.   Pulmonary/Chest: Effort normal and breath sounds normal. No respiratory distress. He has no wheezes. He has no rales.  Abdominal: Soft. Bowel sounds are increased.       Mild diffuse tenderness seems slightly greater and epigastric area. No peritoneal signs  Musculoskeletal: Normal range of motion.  Neurological: He is alert and oriented to person, place, and time.  Skin: Skin is warm.  Psychiatric: He has a normal mood and affect.    ED Course  Procedures   Results for orders placed during the hospital encounter of 05/07/12  POCT I-STAT, CHEM 8      Component Value Range   Sodium 140  135 - 145 mEq/L   Potassium 3.7  3.5 - 5.1 mEq/L   Chloride 108  96 - 112 mEq/L   BUN 18  6 - 23 mg/dL   Creatinine, Ser 9.81  0.50 - 1.35 mg/dL   Glucose, Bld 191 (*) 70 - 99 mg/dL   Calcium, Ion 4.78 (*) 1.12 - 1.23 mmol/L   TCO2 24  0 - 100 mmol/L   Hemoglobin 15.6  13.0 - 17.0 g/dL   HCT  29.5  62.1 - 30.8 %        1. Nausea vomiting and diarrhea   2. Viral syndrome       MDM  11:35 PM patient seen and evaluated. Patient resting comfortably appears in mild discomfort but no acute distress. Abdomen soft with no peritoneal signs. Symptoms are typical for viral GI process. Labs unremarkable. Will hydrate and treat symptoms.   Patient feeling better after treatments. Will discharge with symptomatic treatment at this time.     Angus Seller, [place] 05/08/12 [phone]

## 2012-05-08 MED ORDER — PROMETHAZINE HCL 25 MG PO TABS: 25.0000 mg | ORAL_TABLET | Freq: Four times a day (QID) | ORAL | Status: DC | PRN

## 2012-05-08 NOTE — ED Provider Notes (Signed)
Medical screening examination/treatment/procedure(s) were performed by non-physician practitioner and as supervising physician I was immediately available for consultation/collaboration.  Ulysse Siemen M Sabel Hornbeck, MD 05/08/12 0617 

## 2012-05-29 ENCOUNTER — Emergency Department (HOSPITAL_COMMUNITY): Payer: Medicare Other

## 2012-05-29 ENCOUNTER — Emergency Department (HOSPITAL_COMMUNITY)
Admission: EM | Admit: 2012-05-29 | Discharge: 2012-05-29 | Disposition: A | Discharge status: Home / Self Care | Payer: Medicare Other | Attending: Emergency Medicine | Admitting: Emergency Medicine

## 2012-05-29 DIAGNOSIS — G40909 Epilepsy, unspecified, not intractable, without status epilepticus: Secondary | ICD-10-CM | POA: Insufficient documentation

## 2012-05-29 DIAGNOSIS — R000 Tachycardia, unspecified: Secondary | ICD-10-CM | POA: Insufficient documentation

## 2012-05-29 DIAGNOSIS — X500XXA Overexertion from strenuous movement or load, initial encounter: Secondary | ICD-10-CM | POA: Insufficient documentation

## 2012-05-29 DIAGNOSIS — R059 Cough, unspecified: Secondary | ICD-10-CM | POA: Insufficient documentation

## 2012-05-29 DIAGNOSIS — Z79899 Other long term (current) drug therapy: Secondary | ICD-10-CM | POA: Insufficient documentation

## 2012-05-29 DIAGNOSIS — Y9389 Activity, other specified: Secondary | ICD-10-CM | POA: Insufficient documentation

## 2012-05-29 DIAGNOSIS — S2239XA Fracture of one rib, unspecified side, initial encounter for closed fracture: Secondary | ICD-10-CM | POA: Insufficient documentation

## 2012-05-29 DIAGNOSIS — Z8679 Personal history of other diseases of the circulatory system: Secondary | ICD-10-CM | POA: Insufficient documentation

## 2012-05-29 DIAGNOSIS — Y929 Unspecified place or not applicable: Secondary | ICD-10-CM | POA: Insufficient documentation

## 2012-05-29 DIAGNOSIS — I10 Essential (primary) hypertension: Secondary | ICD-10-CM | POA: Insufficient documentation

## 2012-05-29 MED ORDER — OXYCODONE-ACETAMINOPHEN 5-325 MG PO TABS
2.0000 | ORAL_TABLET | Freq: Once | ORAL | Status: AC
  Administered 2012-05-29: 2 via ORAL
  Filled 2012-05-29: qty 2

## 2012-05-29 MED ORDER — OXYCODONE-ACETAMINOPHEN 5-325 MG PO TABS: 1.0000 | ORAL_TABLET | Freq: Four times a day (QID) | ORAL | Status: DC | PRN

## 2012-05-29 MED ORDER — ALBUTEROL SULFATE HFA 108 (90 BASE) MCG/ACT IN AERS
2.0000 | INHALATION_SPRAY | Freq: Once | RESPIRATORY_TRACT | Status: AC
  Administered 2012-05-29: 2 via RESPIRATORY_TRACT
  Filled 2012-05-29: qty 6.7

## 2012-05-29 MED ORDER — HYDROCOD POLST-CHLORPHEN POLST 10-8 MG/5ML PO LQCR
5.0000 mL | Freq: Once | ORAL | Status: AC
  Administered 2012-05-29: 5 mL via ORAL
  Filled 2012-05-29: qty 5

## 2012-05-29 MED ORDER — HYDROCOD POLST-CHLORPHEN POLST 10-8 MG/5ML PO LQCR: 5.0000 mL | Freq: Two times a day (BID) | ORAL | Status: DC | PRN

## 2012-05-29 MED ORDER — CYCLOBENZAPRINE HCL 10 MG PO TABS
10.0000 mg | ORAL_TABLET | Freq: Once | ORAL | Status: AC
  Administered 2012-05-29: 10 mg via ORAL
  Filled 2012-05-29: qty 1

## 2012-05-29 NOTE — ED Provider Notes (Signed)
History     CSN: [id number]  Arrival date & time 05/29/12  2113   First MD Initiated Contact with Patient 05/29/12 2120      Chief Complaint  Patient presents with  . Back Pain  . Cough    (Consider location/radiation/quality/duration/timing/severity/associated sxs/prior treatment) HPI Comments: 32 year old male presents to the emergency department complaining of mid back pain after being in a "coughing spell" earlier tonight and felt something "crack and pop" in his back. Currently he is complaining of 7/10 sharp shooting pain in the area. Denies shortness of breath. He tried applying icy hot without any relief. He has had a cough for the past 2 weeks productive with green phlegm, however now he is unable to bring up any mucus. Denies fever, chills, nausea, vomiting or any sick contacts. He has tried taking Robitussin and NyQuil without much relief.  Patient is a 32 y.o. male presenting with back pain and cough. The history is provided by the patient.  Back Pain Associated symptoms: no fever   Cough Associated symptoms: no chills, no fever and no shortness of breath     Past Medical History  Diagnosis Date  . Seizures     last experienced a seizure x 3 years ago  . Migraine   . Hypertension     Past Surgical History  Procedure Laterality Date  . Appendectomy    . Maxilofacial      No family history on file.  History  Substance Use Topics  . Smoking status: Not on file  . Smokeless tobacco: Not on file  . Alcohol Use:       Review of Systems  Constitutional: Negative for fever and chills.  HENT: Negative for neck pain and neck stiffness.   Respiratory: Positive for cough. Negative for shortness of breath.   Musculoskeletal: Positive for back pain.  All other systems reviewed and are negative.    Allergies  Review of patient's allergies indicates no known allergies.  Home Medications   Current Outpatient Rx  Name  Route  Sig  Dispense  Refill  .  clonazePAM (KLONOPIN) 0.5 MG tablet   Oral   Take 0.5 mg by mouth 2 (two) times daily as needed. For anxiety         . desvenlafaxine (PRISTIQ) 50 MG 24 hr tablet   Oral   Take 50 mg by mouth daily.         Marland Kitchen lamoTRIgine (LAMICTAL) 100 MG tablet   Oral   Take 100 mg by mouth 2 (two) times daily.           Marland Kitchen lisdexamfetamine (VYVANSE) 50 MG capsule   Oral   Take 50 mg by mouth every morning.           . promethazine (PHENERGAN) 25 MG tablet   Oral   Take 1 tablet (25 mg total) by mouth every 6 (six) hours as needed for nausea.   20 tablet   0   . QUEtiapine (SEROQUEL) 300 MG tablet   Oral   Take 300 mg by mouth at bedtime.           BP 136/94  Pulse 125  Temp(Src) 98.1 F (36.7 C) (Oral)  SpO2 100%  Physical Exam  Nursing note and vitals reviewed. Constitutional: He is oriented to person, place, and time. He appears well-developed and well-nourished.  Appears uncomfortable.  HENT:  Head: Normocephalic and atraumatic.  Mouth/Throat: Oropharynx is clear and moist. No oropharyngeal exudate.  Eyes: Conjunctivae  are normal.  Neck: Normal range of motion. Neck supple.  Cardiovascular: Regular rhythm and normal heart sounds.  Tachycardia present.   Pulmonary/Chest: Effort normal and breath sounds normal. He has no decreased breath sounds. He has no wheezes. He has no rhonchi. He has no rales.  Musculoskeletal: Normal range of motion. He exhibits no edema.       Back:  Lymphadenopathy:    He has no cervical adenopathy.  Neurological: He is alert and oriented to person, place, and time.  Skin: Skin is warm and dry.  Psychiatric: He has a normal mood and affect. His behavior is normal.    ED Course  Procedures (including critical care time)  Labs Reviewed - No data to display Dg Ribs Bilateral W/chest  05/29/2012  *RADIOLOGY REPORT*  Clinical Data: Severe pain in the posterior right mid to rib the region.  Rate is not at that side.  Shortness of breath.   Cough. Posterior chest wall pain.  BILATERAL RIBS AND CHEST - 4+ VIEW  Comparison: Chest 02/17/2011.  Findings: Shallow inspiration.  Normal heart size and pulmonary vascularity.  Peribronchial thickening interstitial changes suggesting chronic bronchitis.  No blunting of costophrenic angles. No pneumothorax.  No significant change since previous chest radiographs.  The left ribs appear intact.  No displaced fractures identified. No focal bone lesion or bone destruction.  There is a mildly displaced fracture of the right posterior ninth rib.  No additional rib fractures are demonstrated.  No focal bone lesion or bone destruction.  IMPRESSION: Chronic bronchitic changes in the lungs.  Mildly displaced fracture of the right posterior ninth rib.  Left ribs appear intact.  No evidence of active pulmonary disease.   Original Report Authenticated By: Burman Nieves, M.D.      1. Rib fracture, right, closed, initial encounter   2. Cough       MDM  Mild displaced fracture of R posterior 9th rib. No acute cardiopulmonary disease. Incentive spirometer given. Rx percocet, tussionex, albuterol. Conservative measures discussed. Lungs clear. Return precautions discussed. Patient states understanding of plan and is agreeable.         Trevor Mace, PA-C 05/29/12 2255

## 2012-05-29 NOTE — ED Notes (Signed)
Pt states that he has been coughing for about 2 weeks and tonight he felt something pop in his back and states that it is now swollen and it hurts to breathe.

## 2012-06-02 NOTE — ED Provider Notes (Signed)
Medical screening examination/treatment/procedure(s) were performed by non-physician practitioner and as supervising physician I was immediately available for consultation/collaboration.    Jonathon Castelo L Atleigh Gruen, MD 06/02/12 1506 

## 2012-08-19 ENCOUNTER — Emergency Department (HOSPITAL_COMMUNITY)
Admission: EM | Admit: 2012-08-19 | Discharge: 2012-08-20 | Disposition: A | Discharge status: Home / Self Care | Payer: Medicare Other | Attending: Emergency Medicine | Admitting: Emergency Medicine

## 2012-08-19 ENCOUNTER — Emergency Department (HOSPITAL_COMMUNITY): Payer: Medicare Other

## 2012-08-19 ENCOUNTER — Encounter (HOSPITAL_COMMUNITY): Payer: Self-pay | Admitting: *Deleted

## 2012-08-19 DIAGNOSIS — S60229A Contusion of unspecified hand, initial encounter: Secondary | ICD-10-CM | POA: Insufficient documentation

## 2012-08-19 DIAGNOSIS — W19XXXA Unspecified fall, initial encounter: Secondary | ICD-10-CM | POA: Insufficient documentation

## 2012-08-19 DIAGNOSIS — S60222A Contusion of left hand, initial encounter: Secondary | ICD-10-CM

## 2012-08-19 DIAGNOSIS — K529 Noninfective gastroenteritis and colitis, unspecified: Secondary | ICD-10-CM

## 2012-08-19 DIAGNOSIS — K5289 Other specified noninfective gastroenteritis and colitis: Secondary | ICD-10-CM | POA: Insufficient documentation

## 2012-08-19 DIAGNOSIS — Y9229 Other specified public building as the place of occurrence of the external cause: Secondary | ICD-10-CM | POA: Insufficient documentation

## 2012-08-19 DIAGNOSIS — G40909 Epilepsy, unspecified, not intractable, without status epilepticus: Secondary | ICD-10-CM | POA: Insufficient documentation

## 2012-08-19 DIAGNOSIS — G43909 Migraine, unspecified, not intractable, without status migrainosus: Secondary | ICD-10-CM | POA: Insufficient documentation

## 2012-08-19 DIAGNOSIS — R197 Diarrhea, unspecified: Secondary | ICD-10-CM | POA: Insufficient documentation

## 2012-08-19 DIAGNOSIS — Z79899 Other long term (current) drug therapy: Secondary | ICD-10-CM | POA: Insufficient documentation

## 2012-08-19 DIAGNOSIS — Y9301 Activity, walking, marching and hiking: Secondary | ICD-10-CM | POA: Insufficient documentation

## 2012-08-19 DIAGNOSIS — R569 Unspecified convulsions: Secondary | ICD-10-CM

## 2012-08-19 DIAGNOSIS — I10 Essential (primary) hypertension: Secondary | ICD-10-CM | POA: Insufficient documentation

## 2012-08-19 DIAGNOSIS — R112 Nausea with vomiting, unspecified: Secondary | ICD-10-CM | POA: Insufficient documentation

## 2012-08-19 LAB — GLUCOSE, CAPILLARY: Glucose-Capillary: 82 mg/dL (ref 70–99)

## 2012-08-19 MED ORDER — SODIUM CHLORIDE 0.9 % IV BOLUS (SEPSIS)
1000.0000 mL | Freq: Once | INTRAVENOUS | Status: AC
  Administered 2012-08-19: 1000 mL via INTRAVENOUS

## 2012-08-19 MED ORDER — FENTANYL CITRATE 0.05 MG/ML IJ SOLN
50.0000 ug | Freq: Once | INTRAMUSCULAR | Status: AC
  Administered 2012-08-20: 50 ug via INTRAVENOUS
  Filled 2012-08-19: qty 2

## 2012-08-19 MED ORDER — ONDANSETRON HCL 4 MG/2ML IJ SOLN
4.0000 mg | Freq: Once | INTRAMUSCULAR | Status: AC
  Administered 2012-08-19: 4 mg via INTRAVENOUS
  Filled 2012-08-19: qty 2

## 2012-08-19 MED ORDER — LORAZEPAM 2 MG/ML IJ SOLN
1.0000 mg | Freq: Once | INTRAMUSCULAR | Status: AC
  Administered 2012-08-20: 1 mg via INTRAVENOUS
  Filled 2012-08-19: qty 1

## 2012-08-19 NOTE — ED Notes (Signed)
Per EMS pt was at [facility] walk in clinic for nausea/diarrhea when he had witnessed seizure 30-45 sec.Per EMS pt with bruising and pani to his left hand, no other injuries. Pt postictal and confused on arrival.

## 2012-08-19 NOTE — ED Notes (Signed)
[id number]<AV> Expected date:08/19/12<BR> Expected time: 9:28 PM<BR> Means of arrival:Ambulance<BR> Comments:<BR> N,v,d,seizures

## 2012-08-19 NOTE — ED Notes (Signed)
Patient transported to X-ray 

## 2012-08-19 NOTE — ED Provider Notes (Signed)
History     CSN: [id number]  Arrival date & time 08/19/12  2150   First MD Initiated Contact with Patient 08/19/12 2322      Chief Complaint  Patient presents with  . Seizure     (Consider location/radiation/quality/duration/timing/severity/associated sxs/prior treatment) HPI This is a 32 year old male with a seizure disorder. He has had about 3 days of nausea, vomiting and diarrhea and has not been able to keep anything on his stomach. He was about to be seen at and [facility] walk-in clinic this evening when he fell to the ground and had a generalized tonic-clonic seizure. The seizure lasted 30-45 seconds. He is complaining of severe pain over his left second metacarpal. He was initially very confused postictally but this has largely cleared. He has had some abdominal cramping associated with his vomiting and diarrhea.  Past Medical History  Diagnosis Date  . Seizures     last experienced a seizure x 3 years ago  . Migraine   . Hypertension     Past Surgical History  Procedure Laterality Date  . Appendectomy    . Maxilofacial      History reviewed. No pertinent family history.  History  Substance Use Topics  . Smoking status: Not on file  . Smokeless tobacco: Not on file  . Alcohol Use:       Review of Systems  All other systems reviewed and are negative.    Allergies  Review of patient's allergies indicates no known allergies.  Home Medications   Current Outpatient Rx  Name  Route  Sig  Dispense  Refill  . atenolol (TENORMIN) 100 MG tablet   Oral   Take 100 mg by mouth 2 (two) times daily.         . clonazePAM (KLONOPIN) 0.5 MG tablet   Oral   Take 0.5 mg by mouth 2 (two) times daily as needed. For anxiety         . desvenlafaxine (PRISTIQ) 50 MG 24 hr tablet   Oral   Take 50 mg by mouth every morning.          . lamoTRIgine (LAMICTAL) 100 MG tablet   Oral   Take 100 mg by mouth 2 (two) times daily.          Marland Kitchen lisdexamfetamine (VYVANSE) 50  MG capsule   Oral   Take 50 mg by mouth every morning.          Marland Kitchen oxyCODONE-acetaminophen (PERCOCET) 5-325 MG per tablet   Oral   Take 1-2 tablets by mouth every 6 (six) hours as needed for pain.   20 tablet   0   . QUEtiapine (SEROQUEL) 300 MG tablet   Oral   Take 300 mg by mouth at bedtime.           BP 133/78  Pulse 90  Temp(Src) 97.6 F (36.4 C) (Oral)  Resp 17  SpO2 100%  Physical Exam General: Well-developed, well-nourished male in no acute distress; appearance consistent with age of record HENT: normocephalic, atraumatic Eyes: pupils equal round and reactive to light; extraocular muscles intact Neck: supple Heart: regular rate and rhythm Lungs: clear to auscultation bilaterally Abdomen: soft; nondistended; nontender; bowel sounds present Extremities: No deformity; full range of motion; pulses are weak; tenderness and ecchymosis overlying the left second metacarpal, no snuffbox tenderness Neurologic: Awake, alert and oriented; motor function intact in all extremities and symmetric; no facial droop Skin: Warm and dry Psychiatric: Flat affect    ED Course  Procedures (including critical care time)    MDM  2:19 AM Patient drinking fluids without emesis.  Nursing notes and vitals signs, including pulse oximetry, reviewed.  Summary of this visit's results, reviewed by myself:  Labs:  Results for orders placed during the hospital encounter of 08/19/12 (from the past 24 hour(s))  GLUCOSE, CAPILLARY     Status: None   Collection Time    08/19/12 11:17 PM      Result Value Range   Glucose-Capillary 82  70 - 99 mg/dL   Comment 1 Documented in Chart     Comment 2 Notify RN      Imaging Studies: Dg Hand Complete Left  08/19/2012   *RADIOLOGY REPORT*  Clinical Data: Larey Seat.  Left hand pain.  LEFT HAND - COMPLETE 3+ VIEW  Comparison: None.  Findings: The joint spaces are maintained.  No acute bony findings or degenerative changes.  IMPRESSION: No acute  fracture.   Original Report Authenticated By: Rudie Meyer, M.D.   EKG Interpretation:  Date & Time: 08/19/2012 10:07 PM  Rate: 87  Rhythm: normal sinus rhythm  QRS Axis: normal  Intervals: normal  ST/T Wave abnormalities: nonspecific T wave changes  Conduction Disutrbances:none  Narrative Interpretation:   Old EKG Reviewed: unchanged             Hanley Seamen, MD 08/20/12 0221

## 2012-08-20 MED ORDER — HYDROCODONE-ACETAMINOPHEN 5-325 MG PO TABS: 2.0000 | ORAL_TABLET | ORAL | Status: DC | PRN

## 2012-08-20 MED ORDER — LAMOTRIGINE 200 MG PO TABS
200.0000 mg | ORAL_TABLET | Freq: Once | ORAL | Status: AC
  Administered 2012-08-20: 200 mg via ORAL
  Filled 2012-08-20: qty 1

## 2012-08-20 NOTE — ED Notes (Signed)
Pt given water. No signs or symptoms of distress. NO nausea or vomiting.

## 2012-08-20 NOTE — ED Notes (Signed)
Pt states he feels better just a little weak.

## 2012-08-20 NOTE — ED Notes (Signed)
D/c home with ride- Rx x 1 given for norco- pt caox 3- states "i feel much better"

## 2012-08-29 ENCOUNTER — Telehealth: Payer: Self-pay | Admitting: *Deleted

## 2012-08-29 DIAGNOSIS — G40309 Generalized idiopathic epilepsy and epileptic syndromes, not intractable, without status epilepticus: Secondary | ICD-10-CM

## 2012-08-29 NOTE — Telephone Encounter (Signed)
Pt here today to f/u on lab requests back in 06-14-12. I told pt that can have drawn early prior to lamital in the am (bring with him to take after labs drawn).  He will come in 08-30-12.

## 2012-08-29 NOTE — Telephone Encounter (Signed)
Noted  

## 2012-09-02 ENCOUNTER — Other Ambulatory Visit: Payer: Self-pay | Admitting: Neurology

## 2012-09-03 ENCOUNTER — Telehealth: Payer: Self-pay | Admitting: Neurology

## 2012-09-03 LAB — CBC WITH DIFFERENTIAL
Basos: 1 % (ref 0–3)
Eosinophils Absolute: 0.2 10*3/uL (ref 0.0–0.4)
HCT: 46.7 % (ref 37.5–51.0)
Hemoglobin: 15.3 g/dL (ref 12.6–17.7)
Immature Grans (Abs): 0 10*3/uL (ref 0.0–0.1)
MCH: 30.4 pg (ref 26.6–33.0)
MCHC: 32.8 g/dL (ref 31.5–35.7)
MCV: 93 fL (ref 79–97)
Monocytes Absolute: 0.6 10*3/uL (ref 0.1–0.9)
Monocytes: 9 % (ref 4–12)
Neutrophils Absolute: 3.8 10*3/uL (ref 1.4–7.0)
Neutrophils Relative %: 61 % (ref 40–74)

## 2012-09-03 LAB — COMPREHENSIVE METABOLIC PANEL
ALT: 66 IU/L — ABNORMAL HIGH (ref 0–44)
Albumin/Globulin Ratio: 1.9 (ref 1.1–2.5)
Albumin: 4.7 g/dL (ref 3.5–5.5)
BUN: 19 mg/dL (ref 6–20)
CO2: 21 mmol/L (ref 19–28)
Creatinine, Ser: 1.23 mg/dL (ref 0.76–1.27)
GFR calc Af Amer: 89 mL/min/{1.73_m2} (ref >59)
GFR calc non Af Amer: 77 mL/min/{1.73_m2} (ref >59)
Globulin, Total: 2.5 g/dL (ref 1.5–4.5)
Glucose: 100 mg/dL — ABNORMAL HIGH (ref 65–99)
Potassium: 4.3 mmol/L (ref 3.5–5.2)
Sodium: 140 mmol/L (ref 134–144)
Total Bilirubin: 0.2 mg/dL (ref 0.0–1.2)
Total Protein: 7.2 g/dL (ref 6.0–8.5)

## 2012-09-03 NOTE — Telephone Encounter (Signed)
I called patient. The blood work was relatively unremarkable, the Lamictal level was on the low side. The patient however, has been well controlled with the seizures, and I would not readjust his medication dosing. The ALT was slightly elevated at 66. This will need to followed. We are not prescribing any medications for this gentleman.

## 2012-09-04 ENCOUNTER — Telehealth: Payer: Self-pay | Admitting: *Deleted

## 2012-09-04 NOTE — Telephone Encounter (Signed)
I called and spoke with the patient to schedule his appt. with Dr.Willis on 10-30-12@12 

## 2012-10-29 ENCOUNTER — Encounter: Payer: Self-pay | Admitting: Neurology

## 2012-10-29 DIAGNOSIS — Z5181 Encounter for therapeutic drug level monitoring: Secondary | ICD-10-CM

## 2012-10-30 ENCOUNTER — Encounter: Payer: Self-pay | Admitting: Neurology

## 2012-10-30 ENCOUNTER — Ambulatory Visit (INDEPENDENT_AMBULATORY_CARE_PROVIDER_SITE_OTHER): Payer: Medicare Other | Admitting: Neurology

## 2012-10-30 VITALS — BP 118/81 | HR 98 | Ht 71.5 in | Wt 220.0 lb

## 2012-10-30 DIAGNOSIS — R569 Unspecified convulsions: Secondary | ICD-10-CM

## 2012-10-30 DIAGNOSIS — Z5181 Encounter for therapeutic drug level monitoring: Secondary | ICD-10-CM

## 2012-10-30 MED ORDER — LAMOTRIGINE 200 MG PO TABS: 200.0000 mg | ORAL_TABLET | Freq: Two times a day (BID) | ORAL | Status: DC

## 2012-10-30 NOTE — Patient Instructions (Signed)
Increase the lamictal (lamotrigine) to 3 1/2 tablets a day for 2 weeks, then take 200 mg twice a day.
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
## 2012-11-04 LAB — COMPREHENSIVE METABOLIC PANEL
ALT: 26 IU/L (ref 0–44)
AST: 15 IU/L (ref 0–40)
Albumin/Globulin Ratio: 2 (ref 1.1–2.5)
Alkaline Phosphatase: 92 IU/L (ref 39–117)
BUN/Creatinine Ratio: 10 (ref 8–19)
GFR calc Af Amer: 107 mL/min/{1.73_m2} (ref >59)
GFR calc non Af Amer: 92 mL/min/{1.73_m2} (ref >59)
Glucose: 81 mg/dL (ref 65–99)
Potassium: 4 mmol/L (ref 3.5–5.2)
Sodium: 141 mmol/L (ref 134–144)
Total Bilirubin: 0.2 mg/dL (ref 0.0–1.2)
Total Protein: 6.6 g/dL (ref 6.0–8.5)

## 2012-11-08 ENCOUNTER — Telehealth: Payer: Self-pay

## 2012-11-08 NOTE — Telephone Encounter (Signed)
Patient called back and discussed events that led up to last seizure in May and stated that Dr. Anne Hahn increased his medications because his levels were low and so he would not have a breakthrough seizure again should he have vomiting again. I let patient know that although it was an unusual event, I will leave that for the MD to decide how to respond on patient's DMV form. I will call when signed document is returned to me.

## 2012-11-12 ENCOUNTER — Telehealth: Payer: Self-pay
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
## 2012-11-15 ENCOUNTER — Other Ambulatory Visit: Payer: Self-pay | Admitting: Gastroenterology

## 2012-11-15 DIAGNOSIS — R109 Unspecified abdominal pain: Secondary | ICD-10-CM

## 2012-11-21 ENCOUNTER — Ambulatory Visit
Admission: RE | Admit: 2012-11-21 | Discharge: 2012-11-21 | Disposition: A | Discharge status: Home / Self Care | Payer: Medicare Other | Source: Ambulatory Visit | Attending: Gastroenterology | Admitting: Gastroenterology

## 2012-11-21 DIAGNOSIS — R109 Unspecified abdominal pain: Secondary | ICD-10-CM

## 2013-02-10 ENCOUNTER — Emergency Department (HOSPITAL_COMMUNITY)
Admission: EM | Admit: 2013-02-10 | Discharge: 2013-02-11 | Disposition: A | Discharge status: Home / Self Care | Payer: Medicare Other | Attending: Emergency Medicine | Admitting: Emergency Medicine

## 2013-02-10 ENCOUNTER — Encounter (HOSPITAL_COMMUNITY): Payer: Self-pay | Admitting: Emergency Medicine

## 2013-02-10 DIAGNOSIS — E559 Vitamin D deficiency, unspecified: Secondary | ICD-10-CM | POA: Insufficient documentation

## 2013-02-10 DIAGNOSIS — R109 Unspecified abdominal pain: Secondary | ICD-10-CM | POA: Insufficient documentation

## 2013-02-10 DIAGNOSIS — I10 Essential (primary) hypertension: Secondary | ICD-10-CM | POA: Insufficient documentation

## 2013-02-10 DIAGNOSIS — Z8669 Personal history of other diseases of the nervous system and sense organs: Secondary | ICD-10-CM | POA: Insufficient documentation

## 2013-02-10 DIAGNOSIS — Z79899 Other long term (current) drug therapy: Secondary | ICD-10-CM | POA: Insufficient documentation

## 2013-02-10 DIAGNOSIS — F319 Bipolar disorder, unspecified: Secondary | ICD-10-CM | POA: Insufficient documentation

## 2013-02-10 DIAGNOSIS — K121 Other forms of stomatitis: Secondary | ICD-10-CM | POA: Insufficient documentation

## 2013-02-10 DIAGNOSIS — M352 Behcet's disease: Secondary | ICD-10-CM | POA: Insufficient documentation

## 2013-02-10 LAB — COMPREHENSIVE METABOLIC PANEL
ALT: 36 U/L (ref 0–53)
Albumin: 4 g/dL (ref 3.5–5.2)
CO2: 24 mEq/L (ref 19–32)
Calcium: 9.3 mg/dL (ref 8.4–10.5)
Chloride: 105 mEq/L (ref 96–112)
Creatinine, Ser: 1.05 mg/dL (ref 0.50–1.35)
GFR calc Af Amer: >90 mL/min (ref >90)
GFR calc non Af Amer: >90 mL/min (ref >90)
Glucose, Bld: 95 mg/dL (ref 70–99)
Sodium: 140 mEq/L (ref 135–145)
Total Bilirubin: 0.3 mg/dL (ref 0.3–1.2)

## 2013-02-10 LAB — CBC WITH DIFFERENTIAL/PLATELET
Eosinophils Absolute: 0.2 10*3/uL (ref 0.0–0.7)
Eosinophils Relative: 2 % (ref 0–5)
HCT: 44.2 % (ref 39.0–52.0)
Lymphocytes Relative: 21 % (ref 12–46)
Lymphs Abs: 1.6 10*3/uL (ref 0.7–4.0)
MCV: 92.3 fL (ref 78.0–100.0)
Monocytes Absolute: 0.6 10*3/uL (ref 0.1–1.0)
Neutro Abs: 5.6 10*3/uL (ref 1.7–7.7)
Platelets: 180 10*3/uL (ref 150–400)
RBC: 4.79 MIL/uL (ref 4.22–5.81)
WBC: 8 10*3/uL (ref 4.0–10.5)

## 2013-02-10 MED ORDER — SODIUM CHLORIDE 0.9 % IV BOLUS (SEPSIS)
1000.0000 mL | Freq: Once | INTRAVENOUS | Status: AC
  Administered 2013-02-10: 1000 mL via INTRAVENOUS

## 2013-02-10 MED ORDER — HYDROMORPHONE HCL PF 1 MG/ML IJ SOLN
1.0000 mg | Freq: Once | INTRAMUSCULAR | Status: AC
  Administered 2013-02-10: 1 mg via INTRAVENOUS
  Filled 2013-02-10: qty 1

## 2013-02-10 NOTE — ED Notes (Signed)
Pt c/o rectal bleeding and outbreak of behcets disease x 10 days; pt sts BRB and not feeling well; pt sts hx of same

## 2013-02-10 NOTE — ED Provider Notes (Signed)
CSN: [id number]     Arrival date & time 02/10/13  1622 History   First MD Initiated Contact with Patient 02/10/13 2132     Chief Complaint  Patient presents with  . Rectal Bleeding   HPI  Mr Dejan Angert is a 32 yo male with a hx of HTN, seizures, and Bipolar disorder who presents with an outbreak of his Behcet's disease for the past 10 days. He was dx'ed about 4 months ago and started on colchicine. He has multiple ulcers in his mouth. Patient had an endoscopy a few weeks ago that showed lesions in his esophagus and an irritated stomach lining.  Patient complains of burning and 'fire' sensation in his throat and mouth when he eats/drinks and in his abdomen with bowel movements. Pain is 10/10 at these times.  Patient has also seenBRB in toilet bowl after bowel movements, no blood with wiping. No diarrhea, constipation, N/V, fever, chills, headaches, chest pain, palpitations. Patient has skin lesions on his left arm and on his scrotum as well that are painful.    Past Medical History  Diagnosis Date  . Seizures     last experienced a seizure x 3 years ago  . Migraine   . Hypertension   . Bipolar disorder   . Vitamin D deficiency    Past Surgical History  Procedure Laterality Date  . Appendectomy    . Maxilofacial    . Fracture surgery Right     Arm   Family History  Problem Relation Age of Onset  . Lung cancer Paternal Grandfather    History  Substance Use Topics  . Smoking status: Never Smoker   . Smokeless tobacco: Not on file  . Alcohol Use: No    Review of Systems All other systems negative except as documented in the HPI. All pertinent positives and negatives as reviewed in the HPI. Allergies  Review of patient's allergies indicates no known allergies.  Home Medications   Current Outpatient Rx  Name  Route  Sig  Dispense  Refill  . atenolol (TENORMIN) 50 MG tablet   Oral   Take 50 mg by mouth daily.         . cholecalciferol (VITAMIN D) 1000 UNITS tablet  Oral   Take 1,000 Units by mouth daily.         . clonazePAM (KLONOPIN) 0.5 MG tablet   Oral   Take 0.5 mg by mouth 3 (three) times daily. For anxiety         . colchicine 0.6 MG tablet   Oral   Take 0.6 mg by mouth daily.         Marland Kitchen esomeprazole (NEXIUM) 40 MG capsule   Oral   Take 40 mg by mouth daily at 12 noon.         . Eszopiclone 3 MG TABS   Oral   Take 3 mg by mouth at bedtime. Take immediately before bedtime         . lamoTRIgine (LAMICTAL) 200 MG tablet   Oral   Take 1 tablet (200 mg total) by mouth 2 (two) times daily.   60 tablet   5   . lisdexamfetamine (VYVANSE) 60 MG capsule   Oral   Take 60 mg by mouth daily at 12 noon.         . QUEtiapine (SEROQUEL XR) 300 MG 24 hr tablet   Oral   Take 300 mg by mouth at bedtime.  BP 124/76  Pulse 135  Temp(Src) 98.1 F (36.7 C) (Oral)  Resp 20  Wt 227 lb 2 oz (103.023 kg)  SpO2 100% Physical Exam  Constitutional: He is oriented to person, place, and time. He appears well-developed and well-nourished. No distress.  HENT:  Head: Normocephalic and atraumatic.  Mouth/Throat: Uvula is midline, oropharynx is clear and moist and mucous membranes are normal. Oral lesions (small, white ulcers along upper left gum line) present.  Cardiovascular: Normal rate, regular rhythm and normal heart sounds.   Pulmonary/Chest: Effort normal and breath sounds normal.  Abdominal: Soft. Normal appearance and bowel sounds are normal. There is no tenderness.  Genitourinary: Penis normal.    Circumcised.  Lymphadenopathy:    He has no cervical adenopathy.  Neurological: He is alert and oriented to person, place, and time.  Skin: Skin is warm and dry.     Psychiatric: He has a normal mood and affect.    ED Course  Procedures (including critical care time) Patient will be referred back to his primary care Dr. for further care and evaluation.  Patient is stable here in the emergency department.  His feeling  better following Dilaudid IV and a GI cocktail.  Patient is advised return here as needed    Carlyle Dolly, PA-C 02/11/13 0110

## 2013-02-11 MED ORDER — HYDROCODONE-ACETAMINOPHEN 5-325 MG PO TABS: 1.0000 | ORAL_TABLET | Freq: Four times a day (QID) | ORAL | Status: DC | PRN

## 2013-02-11 MED ORDER — PREDNISONE 50 MG PO TABS: 50.0000 mg | ORAL_TABLET | Freq: Every day | ORAL | Status: DC

## 2013-02-11 MED ORDER — MAGIC MOUTHWASH W/LIDOCAINE: 5.0000 mL | Freq: Three times a day (TID) | ORAL | Status: DC

## 2013-02-11 MED ORDER — HYDROMORPHONE HCL PF 1 MG/ML IJ SOLN
1.0000 mg | Freq: Once | INTRAMUSCULAR | Status: AC
  Administered 2013-02-11: 1 mg via INTRAVENOUS
  Filled 2013-02-11: qty 1

## 2013-02-11 MED ORDER — GI COCKTAIL ~~LOC~~
30.0000 mL | Freq: Once | ORAL | Status: AC
  Administered 2013-02-11: 30 mL via ORAL
  Filled 2013-02-11: qty 30

## 2013-02-11 NOTE — ED Notes (Signed)
Discharge inst reviewed with patient and Mother.  Voiced understanding.

## 2013-02-11 NOTE — ED Notes (Signed)
Patient stated that the GI cocktail really helped with the pain in his abd

## 2013-02-13 NOTE — ED Provider Notes (Signed)
Medical screening examination/treatment/procedure(s) were performed by non-physician practitioner and as supervising physician I was immediately available for consultation/collaboration.  EKG Interpretation   None        Ardith Lewman R. Hollie Bartus, MD 02/13/13 0713 

## 2013-02-15 ENCOUNTER — Encounter (HOSPITAL_COMMUNITY): Payer: Self-pay | Admitting: Emergency Medicine

## 2013-02-15 ENCOUNTER — Inpatient Hospital Stay (HOSPITAL_COMMUNITY)
Admission: EM | Admit: 2013-02-15 | Discharge: 2013-02-16 | DRG: 379 | Disposition: A | Discharge status: Home / Self Care | Payer: Medicare Other | Attending: Internal Medicine | Admitting: Internal Medicine

## 2013-02-15 ENCOUNTER — Emergency Department (HOSPITAL_COMMUNITY): Payer: Medicare Other

## 2013-02-15 DIAGNOSIS — R1110 Vomiting, unspecified: Secondary | ICD-10-CM

## 2013-02-15 DIAGNOSIS — I10 Essential (primary) hypertension: Secondary | ICD-10-CM | POA: Diagnosis present

## 2013-02-15 DIAGNOSIS — Z5181 Encounter for therapeutic drug level monitoring: Secondary | ICD-10-CM

## 2013-02-15 DIAGNOSIS — Z8719 Personal history of other diseases of the digestive system: Secondary | ICD-10-CM

## 2013-02-15 DIAGNOSIS — Z801 Family history of malignant neoplasm of trachea, bronchus and lung: Secondary | ICD-10-CM

## 2013-02-15 DIAGNOSIS — K5730 Diverticulosis of large intestine without perforation or abscess without bleeding: Secondary | ICD-10-CM | POA: Diagnosis present

## 2013-02-15 DIAGNOSIS — R12 Heartburn: Secondary | ICD-10-CM

## 2013-02-15 DIAGNOSIS — R824 Acetonuria: Secondary | ICD-10-CM | POA: Diagnosis present

## 2013-02-15 DIAGNOSIS — D72829 Elevated white blood cell count, unspecified: Secondary | ICD-10-CM | POA: Diagnosis present

## 2013-02-15 DIAGNOSIS — G40909 Epilepsy, unspecified, not intractable, without status epilepticus: Secondary | ICD-10-CM | POA: Diagnosis present

## 2013-02-15 DIAGNOSIS — R569 Unspecified convulsions: Secondary | ICD-10-CM

## 2013-02-15 DIAGNOSIS — K209 Esophagitis, unspecified without bleeding: Secondary | ICD-10-CM | POA: Diagnosis present

## 2013-02-15 DIAGNOSIS — R1115 Cyclical vomiting syndrome unrelated to migraine: Secondary | ICD-10-CM | POA: Diagnosis present

## 2013-02-15 DIAGNOSIS — F319 Bipolar disorder, unspecified: Secondary | ICD-10-CM | POA: Diagnosis present

## 2013-02-15 DIAGNOSIS — M352 Behcet's disease: Secondary | ICD-10-CM

## 2013-02-15 DIAGNOSIS — K2971 Gastritis, unspecified, with bleeding: Principal | ICD-10-CM | POA: Diagnosis present

## 2013-02-15 DIAGNOSIS — R0789 Other chest pain: Secondary | ICD-10-CM | POA: Diagnosis present

## 2013-02-15 DIAGNOSIS — K2970 Gastritis, unspecified, without bleeding: Secondary | ICD-10-CM

## 2013-02-15 DIAGNOSIS — R109 Unspecified abdominal pain: Secondary | ICD-10-CM | POA: Diagnosis present

## 2013-02-15 DIAGNOSIS — R55 Syncope and collapse: Secondary | ICD-10-CM

## 2013-02-15 DIAGNOSIS — E860 Dehydration: Secondary | ICD-10-CM

## 2013-02-15 LAB — COMPREHENSIVE METABOLIC PANEL
ALT: 34 U/L (ref 0–53)
AST: 21 U/L (ref 0–37)
Albumin: 4.9 g/dL (ref 3.5–5.2)
Alkaline Phosphatase: 100 U/L (ref 39–117)
BUN: 15 mg/dL (ref 6–23)
CO2: 24 mEq/L (ref 19–32)
Calcium: 10.6 mg/dL — ABNORMAL HIGH (ref 8.4–10.5)
Chloride: 97 mEq/L (ref 96–112)
Creatinine, Ser: 1.1 mg/dL (ref 0.50–1.35)
GFR calc Af Amer: >90 mL/min (ref >90)
GFR calc non Af Amer: 86 mL/min — ABNORMAL LOW (ref >90)
Glucose, Bld: 117 mg/dL — ABNORMAL HIGH (ref 70–99)
Potassium: 3.3 mEq/L — ABNORMAL LOW (ref 3.5–5.1)
Sodium: 139 mEq/L (ref 135–145)
Total Bilirubin: 0.7 mg/dL (ref 0.3–1.2)
Total Protein: 9 g/dL — ABNORMAL HIGH (ref 6.0–8.3)

## 2013-02-15 LAB — URINALYSIS, ROUTINE W REFLEX MICROSCOPIC
Glucose, UA: NEGATIVE mg/dL
Hgb urine dipstick: NEGATIVE
Ketones, ur: 40 mg/dL — AB
Nitrite: NEGATIVE
Protein, ur: 30 mg/dL — AB
Specific Gravity, Urine: 1.03 (ref 1.005–1.030)
Urobilinogen, UA: 1 mg/dL (ref 0.0–1.0)
pH: 6.5 (ref 5.0–8.0)

## 2013-02-15 LAB — RAPID URINE DRUG SCREEN, HOSP PERFORMED
Barbiturates: POSITIVE — AB
Cocaine: NOT DETECTED
Opiates: POSITIVE — AB

## 2013-02-15 LAB — URINE MICROSCOPIC-ADD ON

## 2013-02-15 LAB — CBC WITH DIFFERENTIAL/PLATELET
Basophils Absolute: 0 10*3/uL (ref 0.0–0.1)
Basophils Relative: 0 % (ref 0–1)
Eosinophils Absolute: 0.1 10*3/uL (ref 0.0–0.7)
Eosinophils Relative: 1 % (ref 0–5)
HCT: 47.7 % (ref 39.0–52.0)
Hemoglobin: 16.8 g/dL (ref 13.0–17.0)
Lymphocytes Relative: 14 % (ref 12–46)
Lymphs Abs: 1.7 10*3/uL (ref 0.7–4.0)
MCH: 31.6 pg (ref 26.0–34.0)
MCHC: 35.2 g/dL (ref 30.0–36.0)
MCV: 89.7 fL (ref 78.0–100.0)
Monocytes Absolute: 1 10*3/uL (ref 0.1–1.0)
Monocytes Relative: 8 % (ref 3–12)
Neutro Abs: 9.6 10*3/uL — ABNORMAL HIGH (ref 1.7–7.7)
Neutrophils Relative %: 78 % — ABNORMAL HIGH (ref 43–77)
Platelets: 254 10*3/uL (ref 150–400)
RBC: 5.32 MIL/uL (ref 4.22–5.81)
RDW: 12.1 % (ref 11.5–15.5)
WBC: 12.4 10*3/uL — ABNORMAL HIGH (ref 4.0–10.5)

## 2013-02-15 LAB — TROPONIN I: Troponin I: <0.3 ng/mL (ref <0.30)

## 2013-02-15 LAB — LIPASE, BLOOD: Lipase: 23 U/L (ref 11–59)

## 2013-02-15 LAB — RAPID STREP SCREEN (MED CTR MEBANE ONLY): Streptococcus, Group A Screen (Direct): NEGATIVE

## 2013-02-15 MED ORDER — MAGIC MOUTHWASH W/LIDOCAINE
5.0000 mL | Freq: Three times a day (TID) | ORAL | Status: DC
  Administered 2013-02-15 – 2013-02-16 (×3): 5 mL via ORAL
  Filled 2013-02-15 (×4): qty 5

## 2013-02-15 MED ORDER — SODIUM CHLORIDE 0.9 % IV BOLUS (SEPSIS)
1000.0000 mL | Freq: Once | INTRAVENOUS | Status: AC
  Administered 2013-02-15: 1000 mL via INTRAVENOUS

## 2013-02-15 MED ORDER — ONDANSETRON HCL 4 MG/2ML IJ SOLN
4.0000 mg | Freq: Once | INTRAMUSCULAR | Status: AC
  Administered 2013-02-15: 4 mg via INTRAVENOUS
  Filled 2013-02-15: qty 2

## 2013-02-15 MED ORDER — QUETIAPINE FUMARATE ER 300 MG PO TB24
300.0000 mg | ORAL_TABLET | Freq: Every day | ORAL | Status: DC
  Administered 2013-02-15: 300 mg via ORAL
  Filled 2013-02-15 (×2): qty 1

## 2013-02-15 MED ORDER — LAMOTRIGINE 200 MG PO TABS
200.0000 mg | ORAL_TABLET | Freq: Two times a day (BID) | ORAL | Status: DC
  Administered 2013-02-15 – 2013-02-16 (×2): 200 mg via ORAL
  Filled 2013-02-15 (×3): qty 1

## 2013-02-15 MED ORDER — ZOLPIDEM TARTRATE 5 MG PO TABS
5.0000 mg | ORAL_TABLET | Freq: Every evening | ORAL | Status: DC | PRN
  Administered 2013-02-16: 5 mg via ORAL
  Filled 2013-02-15: qty 1

## 2013-02-15 MED ORDER — ONDANSETRON HCL 4 MG/2ML IJ SOLN: 4.0000 mg | Freq: Four times a day (QID) | INTRAMUSCULAR | Status: DC | PRN

## 2013-02-15 MED ORDER — PANTOPRAZOLE SODIUM 40 MG IV SOLR
40.0000 mg | Freq: Once | INTRAVENOUS | Status: AC
  Administered 2013-02-15: 40 mg via INTRAVENOUS
  Filled 2013-02-15: qty 40

## 2013-02-15 MED ORDER — ACETAMINOPHEN 325 MG PO TABS: 650.0000 mg | ORAL_TABLET | Freq: Four times a day (QID) | ORAL | Status: DC | PRN

## 2013-02-15 MED ORDER — IOHEXOL 300 MG/ML  SOLN
50.0000 mL | Freq: Once | INTRAMUSCULAR | Status: AC | PRN
  Administered 2013-02-15: 50 mL via ORAL

## 2013-02-15 MED ORDER — LISDEXAMFETAMINE DIMESYLATE 30 MG PO CAPS
60.0000 mg | ORAL_CAPSULE | Freq: Every day | ORAL | Status: DC
  Administered 2013-02-16: 60 mg via ORAL
  Filled 2013-02-15: qty 2

## 2013-02-15 MED ORDER — ATENOLOL 50 MG PO TABS
50.0000 mg | ORAL_TABLET | Freq: Every day | ORAL | Status: DC
  Administered 2013-02-15: 50 mg via ORAL
  Filled 2013-02-15 (×2): qty 1

## 2013-02-15 MED ORDER — ONDANSETRON HCL 4 MG PO TABS: 4.0000 mg | ORAL_TABLET | Freq: Four times a day (QID) | ORAL | Status: DC | PRN

## 2013-02-15 MED ORDER — GI COCKTAIL ~~LOC~~
30.0000 mL | Freq: Once | ORAL | Status: AC
  Administered 2013-02-15: 30 mL via ORAL
  Filled 2013-02-15: qty 30

## 2013-02-15 MED ORDER — DEXAMETHASONE SODIUM PHOSPHATE 10 MG/ML IJ SOLN
10.0000 mg | Freq: Once | INTRAMUSCULAR | Status: AC
  Administered 2013-02-15: 10 mg via INTRAVENOUS
  Filled 2013-02-15: qty 1

## 2013-02-15 MED ORDER — ACETAMINOPHEN 650 MG RE SUPP: 650.0000 mg | Freq: Four times a day (QID) | RECTAL | Status: DC | PRN

## 2013-02-15 MED ORDER — IOHEXOL 300 MG/ML  SOLN
100.0000 mL | Freq: Once | INTRAMUSCULAR | Status: AC | PRN
  Administered 2013-02-15: 100 mL via INTRAVENOUS

## 2013-02-15 MED ORDER — HYDROMORPHONE HCL PF 1 MG/ML IJ SOLN
1.0000 mg | Freq: Once | INTRAMUSCULAR | Status: AC
  Administered 2013-02-15: 1 mg via INTRAVENOUS
  Filled 2013-02-15: qty 1

## 2013-02-15 MED ORDER — SODIUM CHLORIDE 0.9 % IV SOLN
INTRAVENOUS | Status: DC
  Administered 2013-02-15 – 2013-02-16 (×2): via INTRAVENOUS
  Filled 2013-02-15 (×4): qty 1000

## 2013-02-15 MED ORDER — HYDROMORPHONE HCL PF 1 MG/ML IJ SOLN
1.0000 mg | INTRAMUSCULAR | Status: DC | PRN
Start: 2013-02-15 — End: 2013-02-16
  Administered 2013-02-16: 1 mg via INTRAVENOUS
  Filled 2013-02-15: qty 1

## 2013-02-15 MED ORDER — COLCHICINE 0.6 MG PO TABS
0.6000 mg | ORAL_TABLET | Freq: Every day | ORAL | Status: DC
  Administered 2013-02-16: 0.6 mg via ORAL
  Filled 2013-02-15: qty 1

## 2013-02-15 MED ORDER — ENOXAPARIN SODIUM 40 MG/0.4ML ~~LOC~~ SOLN
40.0000 mg | SUBCUTANEOUS | Status: DC
  Administered 2013-02-15: 40 mg via SUBCUTANEOUS
  Filled 2013-02-15 (×2): qty 0.4

## 2013-02-15 MED ORDER — PANTOPRAZOLE SODIUM 40 MG IV SOLR
40.0000 mg | Freq: Two times a day (BID) | INTRAVENOUS | Status: DC
  Administered 2013-02-15 – 2013-02-16 (×2): 40 mg via INTRAVENOUS
  Filled 2013-02-15 (×3): qty 40

## 2013-02-15 MED ORDER — CLONAZEPAM 0.5 MG PO TABS
0.5000 mg | ORAL_TABLET | Freq: Three times a day (TID) | ORAL | Status: DC
  Administered 2013-02-15 – 2013-02-16 (×3): 0.5 mg via ORAL
  Filled 2013-02-15 (×3): qty 1

## 2013-02-15 NOTE — H&P (Addendum)
[facility] History and Physical  [name] [name] ZOX:[id number] DOB: 06-06-1980 DOA: 02/15/2013   PCP: Gaye Alken, MD   Chief Complaint: Recurrent vomiting abdominal pain  HPI:  32 year old male with a history of the Behcet's disease, bipolar disorder, hypertension, seizure disorder presents with five-day history of recurrent vomiting and abdominal pain. The patient went to the emergency department at [facility] on 02/11/2013. He had symptomatic improvement with antiemetics and IV fluids. He was sent home with prednisone and antibiotics. He began vomiting again the following day, and he has been vomiting intractably since that time. Yesterday, he noted some hematemesis. He denies any fevers, chills, shortness breath, diarrhea, hematochezia, melena, dysuria, hematuria. He complains of a burning in his chest from which he was told was due to esophagitis. He had an EGD by Dr. Ewing Schlein about one month ago which according to the patient showed esophageal and gastric irritation. He presented to the ED at [facility] today because of abdominal pain and intractable vomiting. He states that he sees a rheumatologist at [facility], but there are no records in Epic to verify this fact. He states that he was recently saw his rheumatologist 2 weeks ago. He endorses compliance with all his medications, but he has had problems holding his medications down for the past 5 days due to intractable vomiting. He denies any tobacco, alcohol, or illegal drug use. He denies any other new medications besides that which was given to him at the ED at [facility]. He denies any recent travels. He denies eating any raw or undercooked foods.  In the ED at [facility], the patient was given 2 L normal saline and 10 mg of dexamethasone IV. He was given 12 mg of Zofran and 2 mg of Dilaudid altogether. He continued to have abdominal pain and intermittent vomiting. As a result [facility] was asked to admit. The patient is hemodynamically stable. CMP was  unremarkable. CBC showed WBC of 12.4, hemoglobin 16.8. Urinalysis was negative for any significant pyuria but did show ketonuria. Lipase was 23. CT abdomen final report was pending, but it was reported to me (by Junious Silk, PA-C) as negative for any acute findings except for diverticulosis. Assessment/Plan: Dehydration  -Patient has a degree of hemoconcentration with hemoglobin 16.8 -Secondary to intractable vomiting -Continue IV fluids -Continue antiemetics Intractable vomiting/abdominal pain/hematemesis -?flare of Behcet's vs worsening esophagitis/gastritis vs nonspecific gastroenteritis -Hydromorphone every 4 hours when necessary pain --Antiemetics -May need GI evaluation if no improvement -Hemoglobin is stable -Suspect patient may have had MW tear from retching -UDS -Lactic acid -IV Protonix Chest discomfort -Likely due to esophagitis -Cycle troponins -EKG Behcet's disease -Continue colchicine if he is able to tolerate -Check ESR, CRP, C3, C4, CH 50- to help clarify if he is having exacerbation -Group A strep screen for pharyngeal ulcers Leukocytosis -Likely a combination of his acute medical condition as well as recent prednisone -continue to trend Bipolar disorder -Continue Seroquel and Klonopin Seizure disorder -Continued Lamictal Hypertension -Continue atenolol   Past Medical History  Diagnosis Date  . Seizures     last experienced a seizure x 3 years ago  . Migraine   . Hypertension   . Bipolar disorder   . Vitamin D deficiency    Past Surgical History  Procedure Laterality Date  . Appendectomy    . Maxilofacial    . Fracture surgery Right     Arm   Social History:  reports that he has never smoked. He does not have any smokeless tobacco history on file.  He reports that he does not drink alcohol or use illicit drugs.   Family History  Problem Relation Age of Onset  . Lung cancer Paternal Grandfather      No Known Allergies    Prior to  Admission medications   Medication Sig Start Date End Date Taking? Authorizing Provider  Alum & Mag Hydroxide-Simeth (MAGIC MOUTHWASH W/LIDOCAINE) SOLN Take 5 mLs by mouth 3 (three) times daily. 02/11/13  Yes Jamesetta Orleans Lawyer, PA-C  atenolol (TENORMIN) 50 MG tablet Take 50 mg by mouth daily.   Yes Historical Provider, MD  clonazePAM (KLONOPIN) 0.5 MG tablet Take 0.5 mg by mouth 3 (three) times daily. For anxiety   Yes Historical Provider, MD  colchicine 0.6 MG tablet Take 0.6 mg by mouth daily.   Yes Historical Provider, MD  esomeprazole (NEXIUM) 40 MG capsule Take 40 mg by mouth daily at 12 noon.   Yes Historical Provider, MD  Eszopiclone 3 MG TABS Take 3 mg by mouth at bedtime. Take immediately before bedtime   Yes Historical Provider, MD  HYDROcodone-acetaminophen (NORCO/VICODIN) 5-325 MG per tablet Take 1 tablet by mouth every 6 (six) hours as needed for moderate pain. 02/11/13  Yes Jamesetta Orleans Lawyer, PA-C  lamoTRIgine (LAMICTAL) 200 MG tablet Take 1 tablet (200 mg total) by mouth 2 (two) times daily. 10/30/12  Yes York Spaniel, MD  lisdexamfetamine (VYVANSE) 60 MG capsule Take 60 mg by mouth daily at 12 noon.   Yes Historical Provider, MD  predniSONE (DELTASONE) 50 MG tablet Take 1 tablet (50 mg total) by mouth daily. 02/11/13  Yes Jamesetta Orleans Lawyer, PA-C  QUEtiapine (SEROQUEL XR) 300 MG 24 hr tablet Take 300 mg by mouth at bedtime.   Yes Historical Provider, MD    Review of Systems:  Constitutional:  No weight loss, night sweats, Fevers, chills, fatigue.  Head&Eyes: No headache.  No vision loss.  No eye pain or scotoma ENT:  No Difficulty swallowing,Tooth/dental problems; c/o sore throat and pharyngeal ulcers  Cardio-vascular:  No chest pain, Orthopnea, PND, swelling in lower extremities,  dizziness, palpitations  GI:  No  hematochezia, melena; complains of esophageal burning with nausea, vomiting, abdominal pain  Resp:  No shortness of breath with exertion or at rest.  No cough. No coughing up of blood .No wheezing. Skin:  no rash or lesions.  GU:  no dysuria, change in color of urine, no urgency or frequency. No flank pain.   Musculoskeletal:  No joint pain or swelling. No decreased range of motion. No back pain.  Psych:  No change in mood or affect. No depression or anxiety. Neurologic: No headache, no dysesthesia, no focal weakness, no vision loss. No syncope  Physical Exam: Filed Vitals:   02/15/13 1131 02/15/13 1236 02/15/13 1707  BP: 130/77 120/77 120/84  Pulse: 83 77 87  Temp: 97.6 F (36.4 C)  97.8 F (36.6 C)  TempSrc:   Oral  Resp: 20 14 20   SpO2: 98% 100% 98%   General:  A&O x 3, NAD, nontoxic, pleasant/cooperative Head/Eye: No conjunctival hemorrhage, no icterus, [place]/AT, No nystagmus ENT:  No icterus,  No thrush, good dentition, no pharyngeal exudate but had shallow pharyngeal ulcers Neck:  No masses, no lymphadenpathy, no bruits CV:  RRR, no rub, no gallop, no S3 Lung:  CTAB, good air movement, no wheeze, no rhonchi Abdomen: soft/NT, +BS, nondistended, no peritoneal signs Ext: No cyanosis, No rashes, No petechiae, No lymphangitis, No edema   Labs on Admission:  Basic Metabolic Panel:  Recent Labs Lab 02/10/13 1645 02/15/13 1100  NA 140 139  K 3.6 3.3*  CL 105 97  CO2 24 24  GLUCOSE 95 117*  BUN 10 15  CREATININE 1.05 1.10  CALCIUM 9.3 10.6*   Liver Function Tests:  Recent Labs Lab 02/10/13 1645 02/15/13 1100  AST 21 21  ALT 36 34  ALKPHOS 104 100  BILITOT 0.3 0.7  PROT 7.8 9.0*  ALBUMIN 4.0 4.9    Recent Labs Lab 02/15/13 1100  LIPASE 23   No results found for this basename: AMMONIA,  in the last 168 hours CBC:  Recent Labs Lab 02/10/13 1645 02/15/13 1100  WBC 8.0 12.4*  NEUTROABS 5.6 9.6*  HGB 15.2 16.8  HCT 44.2 47.7  MCV 92.3 89.7  PLT 180 254   Cardiac Enzymes: No results found for this basename: CKTOTAL, CKMB, CKMBINDEX, TROPONINI,  in the last 168 hours BNP: No components found  with this basename: POCBNP,  CBG: No results found for this basename: GLUCAP,  in the last 168 hours  Radiological Exams on Admission: No results found.  EKG: Independently reviewed. pending    Time spent:70 minutes Code Status:   full Family Communication:  No Family at bedside   Ninoshka Wainwright, DO  [facility] Pager [phone]  If 7PM-7AM, please contact night-coverage [email or web address] Password [facility] 02/15/2013, 6:02 PM

## 2013-02-15 NOTE — ED Provider Notes (Signed)
Physical Exam  BP 120/84  Pulse 87  Temp(Src) 97.8 F (36.6 C) (Oral)  Resp 20  SpO2 98%  Physical Exam  Nursing note and vitals reviewed. Constitutional: He is oriented to person, place, and time. He appears well-developed and well-nourished. No distress.  HENT:  Head: Normocephalic and atraumatic.  Right Ear: External ear normal.  Left Ear: External ear normal.  Nose: Nose normal.  Eyes: Conjunctivae are normal.  Neck: Normal range of motion. No tracheal deviation present.  Cardiovascular: Normal rate, regular rhythm and normal heart sounds.   Pulmonary/Chest: Effort normal and breath sounds normal. No stridor.  Abdominal: Soft. He exhibits no distension and no mass. There is tenderness in the epigastric area. There is no rebound and no guarding.  Musculoskeletal: Normal range of motion.  Neurological: He is alert and oriented to person, place, and time.  Skin: Skin is warm and dry. He is not diaphoretic.  Psychiatric: He has a normal mood and affect. His behavior is normal.    ED Course  Procedures  Results for orders placed during the hospital encounter of 02/15/13  CBC WITH DIFFERENTIAL      Result Value Range   WBC 12.4 (*) 4.0 - 10.5 K/uL   RBC 5.32  4.22 - 5.81 MIL/uL   Hemoglobin 16.8  13.0 - 17.0 g/dL   HCT 16.1  09.6 - 04.5 %   MCV 89.7  78.0 - 100.0 fL   MCH 31.6  26.0 - 34.0 pg   MCHC 35.2  30.0 - 36.0 g/dL   RDW 40.9  81.1 - 91.4 %   Platelets 254  150 - 400 K/uL   Neutrophils Relative % 78 (*) 43 - 77 %   Neutro Abs 9.6 (*) 1.7 - 7.7 K/uL   Lymphocytes Relative 14  12 - 46 %   Lymphs Abs 1.7  0.7 - 4.0 K/uL   Monocytes Relative 8  3 - 12 %   Monocytes Absolute 1.0  0.1 - 1.0 K/uL   Eosinophils Relative 1  0 - 5 %   Eosinophils Absolute 0.1  0.0 - 0.7 K/uL   Basophils Relative 0  0 - 1 %   Basophils Absolute 0.0  0.0 - 0.1 K/uL  COMPREHENSIVE METABOLIC PANEL      Result Value Range   Sodium 139  135 - 145 mEq/L   Potassium 3.3 (*) 3.5 - 5.1 mEq/L    Chloride 97  96 - 112 mEq/L   CO2 24  19 - 32 mEq/L   Glucose, Bld 117 (*) 70 - 99 mg/dL   BUN 15  6 - 23 mg/dL   Creatinine, Ser 7.82  0.50 - 1.35 mg/dL   Calcium 95.6 (*) 8.4 - 10.5 mg/dL   Total Protein 9.0 (*) 6.0 - 8.3 g/dL   Albumin 4.9  3.5 - 5.2 g/dL   AST 21  0 - 37 U/L   ALT 34  0 - 53 U/L   Alkaline Phosphatase 100  39 - 117 U/L   Total Bilirubin 0.7  0.3 - 1.2 mg/dL   GFR calc non Af Amer 86 (*) >90 mL/min   GFR calc Af Amer >90  >90 mL/min  LIPASE, BLOOD      Result Value Range   Lipase 23  11 - 59 U/L  URINALYSIS, ROUTINE W REFLEX MICROSCOPIC      Result Value Range   Color, Urine AMBER (*) YELLOW   APPearance CLOUDY (*) CLEAR   Specific Gravity, Urine  1.030  1.005 - 1.030   pH 6.5  5.0 - 8.0   Glucose, UA NEGATIVE  NEGATIVE mg/dL   Hgb urine dipstick NEGATIVE  NEGATIVE   Bilirubin Urine SMALL (*) NEGATIVE   Ketones, ur 40 (*) NEGATIVE mg/dL   Protein, ur 30 (*) NEGATIVE mg/dL   Urobilinogen, UA 1.0  0.0 - 1.0 mg/dL   Nitrite NEGATIVE  NEGATIVE   Leukocytes, UA TRACE (*) NEGATIVE  URINE MICROSCOPIC-ADD ON      Result Value Range   Squamous Epithelial / LPF FEW (*) RARE   WBC, UA 3-6  <3 WBC/hpf   Bacteria, UA FEW (*) RARE   Urine-Other MUCOUS PRESENT       MDM Patient signed out to me by Dan Humphreys, NP. CT abd/pelvis is pending. Regardless of CT results likely admit for symptom control.   Patient's CT shows no acute abnormality. Plan to admit to hospitalist. Patient requesting more pain medications. Will give Dilaudid, zofran, and GI cocktail.   Discussed case with Dr. Arbutus Leas who agrees to admit. Admission is appreciated. Vital signs stable at this time.   Mora Bellman, PA-C 02/15/13 1723

## 2013-02-15 NOTE — ED Notes (Signed)
Pt states that [facility] for same sx (NV, gen abd pain).  States that he feels worse.

## 2013-02-15 NOTE — ED Provider Notes (Signed)
CSN: [id number]     Arrival date & time 02/15/13  1040 History   First MD Initiated Contact with Patient 02/15/13 1046     Chief Complaint  Patient presents with  . Nausea  . Emesis  . Abdominal Pain   (Consider location/radiation/quality/duration/timing/severity/associated sxs/prior Treatment) Patient is a 32 y.o. male presenting with vomiting. The history is provided by the patient. No language interpreter was used.  Emesis Severity:  Moderate Duration:  4 days Quality:  Bilious material Able to tolerate:  Liquids Recent urination:  Decreased Associated symptoms: no chills, no cough, no diarrhea and no fever    Pt is a 32 year old male who presents with a history of Bechet's disease with what he describes as a recent flare up. He reports that he has been having abdominal pain and vomiting since this past Tuesday. He denies fever, chills and diarrhea. He reports that he is having some midline abdominal pain and tenderness that he describes as mostly epigastric. He reports that he was seen at [facility] approx 5 days ago and was given IV fluids and felt better initially but then went home and started vomiting again. He also reports that he had an endoscopy not too long ago and he had some GI irritation and lesions in his esophagus.     Past Medical History  Diagnosis Date  . Seizures     last experienced a seizure x 3 years ago  . Migraine   . Hypertension   . Bipolar disorder   . Vitamin D deficiency    Past Surgical History  Procedure Laterality Date  . Appendectomy    . Maxilofacial    . Fracture surgery Right     Arm   Family History  Problem Relation Age of Onset  . Lung cancer Paternal Grandfather    History  Substance Use Topics  . Smoking status: Never Smoker   . Smokeless tobacco: Not on file  . Alcohol Use: No    Review of Systems  Constitutional: Negative for fever and chills.  Gastrointestinal: Positive for nausea and vomiting. Negative for diarrhea and  blood in stool.  Genitourinary: Negative for dysuria.  All other systems reviewed and are negative.    Allergies  Review of patient's allergies indicates no known allergies.  Home Medications   Current Outpatient Rx  Name  Route  Sig  Dispense  Refill  . Alum & Mag Hydroxide-Simeth (MAGIC MOUTHWASH W/LIDOCAINE) SOLN   Oral   Take 5 mLs by mouth 3 (three) times daily.   120 mL   0   . atenolol (TENORMIN) 50 MG tablet   Oral   Take 50 mg by mouth daily.         . clonazePAM (KLONOPIN) 0.5 MG tablet   Oral   Take 0.5 mg by mouth 3 (three) times daily. For anxiety         . colchicine 0.6 MG tablet   Oral   Take 0.6 mg by mouth daily.         Marland Kitchen esomeprazole (NEXIUM) 40 MG capsule   Oral   Take 40 mg by mouth daily at 12 noon.         . Eszopiclone 3 MG TABS   Oral   Take 3 mg by mouth at bedtime. Take immediately before bedtime         . HYDROcodone-acetaminophen (NORCO/VICODIN) 5-325 MG per tablet   Oral   Take 1 tablet by mouth every 6 (six) hours  as needed for moderate pain.   15 tablet   0   . lamoTRIgine (LAMICTAL) 200 MG tablet   Oral   Take 1 tablet (200 mg total) by mouth 2 (two) times daily.   60 tablet   5   . lisdexamfetamine (VYVANSE) 60 MG capsule   Oral   Take 60 mg by mouth daily at 12 noon.         . predniSONE (DELTASONE) 50 MG tablet   Oral   Take 1 tablet (50 mg total) by mouth daily.   5 tablet   0   . QUEtiapine (SEROQUEL XR) 300 MG 24 hr tablet   Oral   Take 300 mg by mouth at bedtime.          There were no vitals taken for this visit. Physical Exam  Vitals reviewed. Constitutional: He is oriented to person, place, and time. He appears well-developed and well-nourished. No distress.  Eyes: Conjunctivae and EOM are normal. Pupils are equal, round, and reactive to light.  Neck: Normal range of motion. Neck supple. No JVD present. No tracheal deviation present. No thyromegaly present.  Cardiovascular: Normal rate  and regular rhythm.   Pulmonary/Chest: Effort normal and breath sounds normal. No respiratory distress. He has no wheezes.  Abdominal: Soft. Bowel sounds are normal. He exhibits no distension. There is tenderness.  Musculoskeletal: Normal range of motion.  Lymphadenopathy:    He has no cervical adenopathy.  Neurological: He is alert and oriented to person, place, and time.  Skin: Skin is warm and dry.  Psychiatric: He has a normal mood and affect. His behavior is normal. Judgment and thought content normal.    ED Course  Procedures (including critical care time) Labs Review Labs Reviewed  CBC WITH DIFFERENTIAL  COMPREHENSIVE METABOLIC PANEL  LIPASE, BLOOD  URINALYSIS, ROUTINE W REFLEX MICROSCOPIC   Imaging Review No results found.  EKG Interpretation   None       MDM   1. Gastritis   2. Heartburn   3. Vomiting    Bechet's disease, flare-up with gastritis, epigastric/midline tenderness. IV fluids x 2 liters, protonix, and zofran, still not feeling a lot better. Vomiting here after zofran, will probably admit for IV hydration and Obs. Awaiting Abd/Pelvis CT, then will call hospitalist.       Irish Elders, NP 02/15/13 1609

## 2013-02-15 NOTE — ED Provider Notes (Signed)
Medical screening examination/treatment/procedure(s) were conducted as a shared visit with non-physician practitioner(s) and myself.  I personally evaluated the patient during the encounter.  EKG Interpretation   None      32yM with oral/throat pain. Hx of Behcet's reports recent endoscopy with esophageal/gastric irritation. Having difficulty with PO because of symptoms.  Has been using magic mouth wash, prednisone, vicodin and nexium. Symptoms poorly controlled with this. Has been having a lot of n/v and difficulty keeping food down. On he is laying in bed, NAD. Superficial ulcerations noted on gingiva. No evidence of deep space neck infection or respiratory compromise. Mild diffuse abdominal tenderness w/o rebound or guarding. Pt may have viral illness, versus symptoms related to behcet's. He additionally takes colchicine which may be exacerbating GI symptoms. W/u with hemoconcentration and ketonuria consistent with dehydration. Given degree of symptoms and that pt seems to have been making reasonable effort to control symptoms at home unsuccessfully, will admit.   Raeford Razor, MD 02/16/13 [phone]

## 2013-02-15 NOTE — ED Provider Notes (Signed)
Medical screening examination/treatment/procedure(s) were performed by non-physician practitioner and as supervising physician I was immediately available for consultation/collaboration.     Celene Kras, MD 02/15/13 [phone]

## 2013-02-16 DIAGNOSIS — R1110 Vomiting, unspecified: Secondary | ICD-10-CM

## 2013-02-16 LAB — URINE CULTURE: Culture: NO GROWTH

## 2013-02-16 LAB — CBC
HCT: 39.2 % (ref 39.0–52.0)
MCH: 30.3 pg (ref 26.0–34.0)
MCHC: 33.4 g/dL (ref 30.0–36.0)
RDW: 12.2 % (ref 11.5–15.5)

## 2013-02-16 LAB — BASIC METABOLIC PANEL
BUN: 15 mg/dL (ref 6–23)
Creatinine, Ser: 1.04 mg/dL (ref 0.50–1.35)
GFR calc Af Amer: >90 mL/min (ref >90)
GFR calc non Af Amer: >90 mL/min (ref >90)

## 2013-02-16 LAB — C-REACTIVE PROTEIN: CRP: <0.5 mg/dL — ABNORMAL LOW (ref <0.60)

## 2013-02-16 LAB — TROPONIN I: Troponin I: <0.3 ng/mL (ref <0.30)

## 2013-02-16 MED ORDER — QUETIAPINE FUMARATE ER 300 MG PO TB24: 300.0000 mg | ORAL_TABLET | Freq: Every day | ORAL | Status: DC

## 2013-02-16 MED ORDER — COLCHICINE 0.6 MG PO TABS: 0.6000 mg | ORAL_TABLET | Freq: Every day | ORAL | Status: DC

## 2013-02-16 MED ORDER — MAGIC MOUTHWASH W/LIDOCAINE: 5.0000 mL | Freq: Three times a day (TID) | ORAL | Status: DC

## 2013-02-16 MED ORDER — HYDROMORPHONE HCL 2 MG PO TABS: 2.0000 mg | ORAL_TABLET | ORAL | Status: DC | PRN

## 2013-02-16 MED ORDER — MAGIC MOUTHWASH W/LIDOCAINE: 5.0000 mL | Freq: Three times a day (TID) | ORAL | Status: DC | PRN

## 2013-02-16 MED ORDER — ESZOPICLONE 3 MG PO TABS: 3.0000 mg | ORAL_TABLET | Freq: Every day | ORAL | Status: DC

## 2013-02-16 MED ORDER — ONDANSETRON HCL 4 MG PO TABS: 4.0000 mg | ORAL_TABLET | Freq: Four times a day (QID) | ORAL | Status: DC | PRN

## 2013-02-16 MED ORDER — HYDROCODONE-ACETAMINOPHEN 5-325 MG PO TABS: 1.0000 | ORAL_TABLET | Freq: Four times a day (QID) | ORAL | Status: DC | PRN

## 2013-02-16 MED ORDER — CLONAZEPAM 0.5 MG PO TABS: 0.5000 mg | ORAL_TABLET | Freq: Three times a day (TID) | ORAL | Status: DC

## 2013-02-16 MED ORDER — ESOMEPRAZOLE MAGNESIUM 40 MG PO CPDR: 40.0000 mg | DELAYED_RELEASE_CAPSULE | Freq: Every day | ORAL | Status: DC

## 2013-02-16 MED ORDER — ATENOLOL 50 MG PO TABS: 50.0000 mg | ORAL_TABLET | Freq: Every day | ORAL | Status: DC

## 2013-02-16 MED ORDER — HYDROCODONE-ACETAMINOPHEN 5-325 MG PO TABS
1.0000 | ORAL_TABLET | Freq: Four times a day (QID) | ORAL | Status: DC | PRN
  Administered 2013-02-16: 1 via ORAL
  Filled 2013-02-16: qty 1

## 2013-02-16 MED ORDER — LISDEXAMFETAMINE DIMESYLATE 60 MG PO CAPS: 60.0000 mg | ORAL_CAPSULE | Freq: Every day | ORAL | Status: DC

## 2013-02-16 NOTE — Discharge Summary (Signed)
Physician Discharge Summary  [name] [name] WUJ:[id number] DOB: December 28, 1980 DOA: 02/15/2013  PCP: Gaye Alken, MD  Admit date: 02/15/2013 Discharge date: 02/16/2013  Recommendations for Outpatient Follow-up:  1. Please follow up with GI per scheduled appointment. 2. Continue current medications such as Zofran to control nausea and vomiting.  3. Patient tolerated PO intake this am and is stable for discharge home.  Discharge Diagnoses:  Active Problems:   HYPERTENSION, UNSPECIFIED   SEIZURE DISORDER   Encounter for therapeutic drug monitoring   Dehydration   Behcet's disease   Recurrent vomiting    Discharge Condition: medically stable for discharge home today  Diet recommendation: as tolerated   History of present illness:  32 year old male with a history of the Behcet's disease, bipolar disorder, hypertension, seizure disorder presented to [facility] ED 02/15/2013 with five-day history of recurrent vomiting and abdominal pain. The patient went to the emergency department at [facility] on 02/11/2013. He had symptomatic improvement with antiemetics and IV fluids. He was sent home with prednisone and antibiotics. He began vomiting again the following day, and he has been vomiting intractably since that time. There was no reports of fever or chills. He did notice small amount of blood in emesis. Patient had EGD by Dr. Ewing Schlein about one month ago which according to the patient showed esophageal and gastric irritation.  In the ED, the patient was given 2 L normal saline and 10 mg of dexamethasone IV. He was given 12 mg of Zofran and 2 mg of Dilaudid altogether. He continued to have abdominal pain and intermittent vomiting. As a result [facility] was asked to admit. CMP was unremarkable. CBC showed WBC of 12.4, hemoglobin 16.8. Urinalysis was negative for any significant pyuria but did show ketonuria. Lipase was 23. CT abdomen final report was negative for any acute findings except for  diverticulosis.   Assessment/Plan:   Principal Problem: Intractable vomiting/abdominal pain/hematemesis  - likely gastritis or even cyclic vomiting syndrome - provided supportive care with IV fluids and antiemetics - he tolerated PO intake this am - hematemesis reported by the patient is likely slight MW tear but hemoglobin is stable at 13 at the time of discharge and little lower than on the admission and this is likely dilutional   Active Problems: Chest discomfort  - Likely due to esophagitis  - normal sinus rhythm on admission Behcet's disease  - will continue colchicine  Bipolar disorder  -Continue Seroquel and Klonopin  Seizure disorder  -Continued Lamictal  Hypertension  -Continue atenolol if BP tolerates; pt measures his BP at home and know ont to take atenolol if HR less than 60 or SBP less than 100   Signed:  Manson Passey, MD  [facility] 02/16/2013, 12:05 PM  Pager #: [phone]   Discharge Exam: Filed Vitals:   02/16/13 1147  BP: 104/67  Pulse: 70  Temp:   Resp:    Filed Vitals:   02/16/13 0527 02/16/13 0531 02/16/13 0956 02/16/13 1147  BP: 89/49 98/72 96/53  104/67  Pulse: 66   70  Temp: 97.6 F (36.4 C)     TempSrc: Oral     Resp: 18     Height:      Weight:      SpO2: 100%       General: Pt is alert, follows commands appropriately, not in acute distress Cardiovascular: Regular rate and rhythm, S1/S2 +, no murmurs, no rubs, no gallops Respiratory: Clear to auscultation bilaterally, no wheezing, no crackles, no rhonchi Abdominal: Soft, non  tender, non distended, bowel sounds +, no guarding Extremities: no edema, no cyanosis, pulses palpable bilaterally DP and PT Neuro: Grossly nonfocal  Discharge Instructions  Discharge Orders   Future Appointments Provider Department Dept Phone   05/21/2013 8:00 AM York Spaniel, MD [facility] [phone]   Future Orders Complete By Expires   Call MD for:  difficulty  breathing, headache or visual disturbances  As directed    Call MD for:  persistant dizziness or light-headedness  As directed    Call MD for:  persistant nausea and vomiting  As directed    Call MD for:  severe uncontrolled pain  As directed    Diet - low sodium heart healthy  As directed    Discharge instructions  As directed    Comments:     1. Please follow up with GI per scheduled appointment. 2. Continue current medications such as Zofran to control nausea and vomiting.  3. Patient tolerated PO intake this am and is stable for discharge home.   Increase activity slowly  As directed        Medication List         atenolol 50 MG tablet  Commonly known as:  TENORMIN  Take 1 tablet (50 mg total) by mouth daily.     clonazePAM 0.5 MG tablet  Commonly known as:  KLONOPIN  Take 1 tablet (0.5 mg total) by mouth 3 (three) times daily. For anxiety     colchicine 0.6 MG tablet  Take 1 tablet (0.6 mg total) by mouth daily.     esomeprazole 40 MG capsule  Commonly known as:  NEXIUM  Take 1 capsule (40 mg total) by mouth daily at 12 noon.     Eszopiclone 3 MG Tabs  Take 1 tablet (3 mg total) by mouth at bedtime. Take immediately before bedtime     HYDROcodone-acetaminophen 5-325 MG per tablet  Commonly known as:  NORCO/VICODIN  Take 1 tablet by mouth every 6 (six) hours as needed for moderate pain.     HYDROmorphone 2 MG tablet  Commonly known as:  DILAUDID  Take 1 tablet (2 mg total) by mouth every 4 (four) hours as needed for severe pain.     lamoTRIgine 200 MG tablet  Commonly known as:  LAMICTAL  Take 1 tablet (200 mg total) by mouth 2 (two) times daily.     lisdexamfetamine 60 MG capsule  Commonly known as:  VYVANSE  Take 1 capsule (60 mg total) by mouth daily at 12 noon.     magic mouthwash w/lidocaine Soln  Take 5 mLs by mouth 3 (three) times daily.     magic mouthwash w/lidocaine Soln  Take 5 mLs by mouth 3 (three) times daily as needed for mouth pain.      ondansetron 4 MG tablet  Commonly known as:  ZOFRAN  Take 1 tablet (4 mg total) by mouth every 6 (six) hours as needed for nausea.     predniSONE 50 MG tablet  Commonly known as:  DELTASONE  Take 1 tablet (50 mg total) by mouth daily.     QUEtiapine 300 MG 24 hr tablet  Commonly known as:  SEROQUEL XR  Take 1 tablet (300 mg total) by mouth at bedtime.           Follow-up Information   Follow up with Gaye Alken, MD. Call in 1 week.   Specialty:  Family Medicine   Contact information:   [address] [employer]  [place] [address] [phone]        The results of significant diagnostics from this hospitalization (including imaging, microbiology, ancillary and laboratory) are listed below for reference.    Significant Diagnostic Studies: Ct Abdomen Pelvis W Contrast  02/15/2013   CLINICAL DATA:  Nausea, vomiting, and general abdominal pain. Symptoms are worse today. White count 12.4. History of hypertension, appendectomy.  EXAM: CT ABDOMEN AND PELVIS WITH CONTRAST  TECHNIQUE: Multidetector CT imaging of the abdomen and pelvis was performed using the standard protocol following bolus administration of intravenous contrast.  CONTRAST:  50mL OMNIPAQUE IOHEXOL 300 MG/ML SOLN, OMNIPAQUE IOHEXOL 300 MG/ML SOLN  COMPARISON:  11/17/2011  FINDINGS: The lung bases are negative. Note is made of a hiatal hernia. Heart size appears normal.  No focal abnormality identified within the liver, spleen, pancreas, adrenal glands, or kidneys. The gallbladder is present.  The stomach and small bowel loops are normal in appearance. There are colonic diverticula. No acute diverticulitis. There is no abscess or free intraperitoneal air. No retroperitoneal or mesenteric adenopathy. No evidence for aortic aneurysm. Visualized osseous structures have a normal appearance.  IMPRESSION: 1. No evidence for acute abnormality of the abdomen or pelvis. 2. Status post appendectomy. 3. Diverticulosis  without diverticulitis.   Electronically Signed   By: Rosalie Gums M.D.   On: 02/15/2013 15:00    Microbiology: Recent Results (from the past 240 hour(s))  RAPID STREP SCREEN     Status: None   Collection Time    02/15/13  7:14 PM      Result Value Range Status   Streptococcus, Group A Screen (Direct) NEGATIVE  NEGATIVE Final   Comment: (NOTE)     A Rapid Antigen test may result negative if the antigen level in the     sample is below the detection level of this test. The FDA has not     cleared this test as a stand-alone test therefore the rapid antigen     negative result has reflexed to a Group A Strep culture.     Labs: Basic Metabolic Panel:  Recent Labs Lab 02/10/13 1645 02/15/13 1100 02/16/13 0650  NA 140 139 136  K 3.6 3.3* 3.6  CL 105 97 102  CO2 24 24 23   GLUCOSE 95 117* 102*  BUN 10 15 15   CREATININE 1.05 1.10 1.04  CALCIUM 9.3 10.6* 9.1   Liver Function Tests:  Recent Labs Lab 02/10/13 1645 02/15/13 1100  AST 21 21  ALT 36 34  ALKPHOS 104 100  BILITOT 0.3 0.7  PROT 7.8 9.0*  ALBUMIN 4.0 4.9    Recent Labs Lab 02/15/13 1100  LIPASE 23   No results found for this basename: AMMONIA,  in the last 168 hours CBC:  Recent Labs Lab 02/10/13 1645 02/15/13 1100 02/16/13 0650  WBC 8.0 12.4* 10.2  NEUTROABS 5.6 9.6*  --   HGB 15.2 16.8 13.1  HCT 44.2 47.7 39.2  MCV 92.3 89.7 90.7  PLT 180 254 220   Cardiac Enzymes:  Recent Labs Lab 02/15/13 1907 02/16/13 0110 02/16/13 0650  TROPONINI <0.30 <0.30 <0.30   BNP: BNP (last 3 results) No results found for this basename: PROBNP,  in the last 8760 hours CBG: No results found for this basename: GLUCAP,  in the last 168 hours  Time coordinating discharge: Over 30 minutes

## 2013-02-16 NOTE — Progress Notes (Signed)
Patient's d/c instructions rendered to patient,he verbalized understanding.Pain is controlled. In good spirit.Prescriptions also given to patient.- Hulda Marin RN

## 2013-02-16 NOTE — ED Provider Notes (Signed)
Medical screening examination/treatment/procedure(s) were conducted as a shared visit with non-physician practitioner(s) and myself.  I personally evaluated the patient during the encounter.  EKG Interpretation     Ventricular Rate:    PR Interval:    QRS Duration:   QT Interval:    QTC Calculation:   R Axis:     Text Interpretation:             See other note.   Raeford Razor, MD 02/16/13 [phone]

## 2013-02-16 NOTE — Progress Notes (Signed)
Patient d/c home accompanied by his father.Stable.Hulda Marin RN

## 2013-02-17 ENCOUNTER — Telehealth: Payer: Self-pay | Admitting: Neurology

## 2013-02-17 LAB — CULTURE, GROUP A STREP

## 2013-02-17 NOTE — Care Management Note (Signed)
UR complete   Chanel Mckesson,RN,MSN [phone] 

## 2013-02-18 LAB — C3 COMPLEMENT: C3 Complement: 212 mg/dL — ABNORMAL HIGH (ref 90–180)

## 2013-02-18 LAB — C4 COMPLEMENT: Complement C4, Body Fluid: 25 mg/dL (ref 10–40)

## 2013-02-18 LAB — COMPLEMENT, TOTAL: Compl, Total (CH50): >60 U/mL — ABNORMAL HIGH (ref 31–60)

## 2013-02-20 ENCOUNTER — Ambulatory Visit (INDEPENDENT_AMBULATORY_CARE_PROVIDER_SITE_OTHER): Payer: Medicare Other | Admitting: Neurology

## 2013-02-20 ENCOUNTER — Encounter: Payer: Self-pay | Admitting: Neurology

## 2013-02-20 VITALS — BP 141/95 | HR 110 | Wt 222.0 lb

## 2013-02-20 DIAGNOSIS — Z5181 Encounter for therapeutic drug level monitoring: Secondary | ICD-10-CM

## 2013-02-20 DIAGNOSIS — R569 Unspecified convulsions: Secondary | ICD-10-CM

## 2013-02-20 DIAGNOSIS — M352 Behcet's disease: Secondary | ICD-10-CM

## 2013-02-20 NOTE — Patient Instructions (Signed)
Seizure, Adult A seizure is abnormal electrical activity in the brain. Seizures can cause a change in attention or behavior (altered mental status). Seizures often involve uncontrollable shaking (convulsions). Seizures usually last from 30 seconds to 2 minutes. Epilepsy is a brain disorder in which a patient has repeated seizures over time. CAUSES  There are many different problems that can cause seizures. In some cases, no cause is found. Common causes of seizures include:  Head injuries.  Brain tumors.  Infections.  Imbalance of chemicals in the blood.  Kidney failure or liver failure.  Heart disease.  Drug abuse.  Stroke.  Withdrawal from certain drugs or alcohol.  Birth defects.  Malfunction of a neurosurgical device placed in the brain. SYMPTOMS  Symptoms vary depending on the part of the brain that is involved. Right before a seizure, you may have a warning (aura) that a seizure is about to occur. An aura may include the following symptoms:   Fear or anxiety.  Nausea.  Feeling like the room is spinning (vertigo).  Vision changes, such as seeing flashing lights or spots. Common symptoms during a seizure include:  Convulsions.  Drooling.  Rapid eye movements.  Grunting.  Loss of bladder and bowel control.  Bitter taste in the mouth. After a seizure, you may feel confused and sleepy. You may also have an injury resulting from convulsions during the seizure. DIAGNOSIS  Your caregiver will perform a physical exam and run some tests to determine the type and cause of your seizure. These tests may include:  Blood tests.  A lumbar puncture test. In this test, a small amount of fluid is removed from the spine and examined.  Electrocardiography (ECG). This test records the electrical activity in your heart.  Imaging tests, such as computed tomography (CT) scans or magnetic resonance imaging (MRI).  Electroencephalography (EEG). This test records the electrical  activity in your brain. TREATMENT  Seizures usually stop on their own. Treatment will depend on the cause of your seizure. In some cases, medicine may be given to prevent future seizures. HOME CARE INSTRUCTIONS   If you are given medicines, take them exactly as prescribed by your caregiver.  Keep all follow-up appointments as directed by your caregiver.  Do not swim or drive until your caregiver says it is okay.  Teach friends and family what to do if you have a seizure. They should:  Lay you on the ground to prevent a fall.  Put a cushion under your head.  Loosen any tight clothing around your neck.  Turn you on your side. If vomiting occurs, this helps keep your airway clear.  Stay with you until you recover. SEEK IMMEDIATE MEDICAL CARE IF:  The seizure lasts longer than 2 to 5 minutes.  The seizure is severe or the person does not wake up after the seizure.  The person has altered mental status. Drive the person to the emergency department or call your local emergency services (911 in [place] MAKE SURE YOU:  Understand these instructions.  Will watch your condition.  Will get help right away if you are not doing well or get worse. Document Released: 03/17/2000 Document Revised: 06/12/2011 Document Reviewed: 03/08/2011 Exit[employer] Patient Information 2014 [employer].  

## 2013-02-20 NOTE — Progress Notes (Signed)
Reason for visit: Seizures  [name] [name] is an 32 y.o. male  History of present illness:  [name] [name] is a 32 year old right-handed white male with a history of seizures since 2006. The patient has a two-year history of intermittent episodes of oral and genital ulcers that may also include the esophagus and the skin. The patient recently received a diagnosis of Behcet's disease 2 months ago. The patient was in hospital in November of 2014 with recurring nausea and vomiting. The patient was placed on colchicine and prednisone for his Behcet's disease. The patient is recovering at this point. Fortunately, the patient has not had a recurring seizure. The patient remains on Lamictal, and he is tolerating the medication well. The patient returns for an evaluation.  Past Medical History  Diagnosis Date  . Seizures     last experienced a seizure x 3 years ago  . Migraine   . Hypertension   . Bipolar disorder   . Vitamin D deficiency   . Behcet's disease     Past Surgical History  Procedure Laterality Date  . Appendectomy    . Maxilofacial    . Fracture surgery Right     Arm    Family History  Problem Relation Age of Onset  . Lung cancer Paternal Grandfather     Social history:  reports that he has never smoked. He does not have any smokeless tobacco history on file. He reports that he does not drink alcohol or use illicit drugs.   No Known Allergies  Medications:  Current Outpatient Prescriptions on File Prior to Visit  Medication Sig Dispense Refill  . Alum & Mag Hydroxide-Simeth (MAGIC MOUTHWASH W/LIDOCAINE) SOLN Take 5 mLs by mouth 3 (three) times daily.  120 mL  0  . Alum & Mag Hydroxide-Simeth (MAGIC MOUTHWASH W/LIDOCAINE) SOLN Take 5 mLs by mouth 3 (three) times daily as needed for mouth pain.  100 mL  0  . atenolol (TENORMIN) 50 MG tablet Take 1 tablet (50 mg total) by mouth daily.  30 tablet  0  . clonazePAM (KLONOPIN) 0.5 MG tablet Take 1 tablet (0.5 mg total) by mouth 3  (three) times daily. For anxiety  90 tablet  0  . colchicine 0.6 MG tablet Take 1 tablet (0.6 mg total) by mouth daily.  30 tablet  0  . esomeprazole (NEXIUM) 40 MG capsule Take 1 capsule (40 mg total) by mouth daily at 12 noon.  30 capsule  0  . Eszopiclone 3 MG TABS Take 1 tablet (3 mg total) by mouth at bedtime. Take immediately before bedtime  30 tablet  0  . HYDROcodone-acetaminophen (NORCO/VICODIN) 5-325 MG per tablet Take 1 tablet by mouth every 6 (six) hours as needed for moderate pain.  45 tablet  0  . HYDROmorphone (DILAUDID) 2 MG tablet Take 1 tablet (2 mg total) by mouth every 4 (four) hours as needed for severe pain.  60 tablet  0  . lamoTRIgine (LAMICTAL) 200 MG tablet Take 1 tablet (200 mg total) by mouth 2 (two) times daily.  60 tablet  5  . lisdexamfetamine (VYVANSE) 60 MG capsule Take 1 capsule (60 mg total) by mouth daily at 12 noon.  30 capsule  0  . ondansetron (ZOFRAN) 4 MG tablet Take 1 tablet (4 mg total) by mouth every 6 (six) hours as needed for nausea.  45 tablet  0  . predniSONE (DELTASONE) 50 MG tablet Take 1 tablet (50 mg total) by mouth daily.  5  tablet  0  . QUEtiapine (SEROQUEL XR) 300 MG 24 hr tablet Take 1 tablet (300 mg total) by mouth at bedtime.  30 tablet  0   No current facility-administered medications on file prior to visit.    ROS:  Out of a complete 14 system review of symptoms, the patient complains only of the following symptoms, and all other reviewed systems are negative.  Joint pain, joint swelling Depression History of seizures  Blood pressure 141/95, pulse 110, weight 222 lb (100.699 kg).  Physical Exam  General: The patient is alert and cooperative at the time of the examination.  Skin: No significant peripheral edema is noted.   Neurologic Exam  Mental status: The patient is oriented x 3.  Cranial nerves: Facial symmetry is present. Speech is normal, no aphasia or dysarthria is noted. Extraocular movements are full. Visual  fields are full.  Motor: The patient has good strength in all 4 extremities.  Sensory examination: Soft touch sensation is symmetric on the face, arms, and legs.  Coordination: The patient has good finger-nose-finger and heel-to-shin bilaterally.  Gait and station: The patient has a normal gait. Tandem gait is normal. Romberg is negative. No drift is seen.  Reflexes: Deep tendon reflexes are symmetric, but are somewhat brisk throughout.   Assessment/Plan:  1. Behcet's disease  2. History of seizures  3. Bipolar disorder  The patient is doing well at this point, and he is now on prednisone and colchicine for his Behcet's disease. It is possible that the Behcet's disease may be the etiology of his seizures. The patient has never had events of confusion, headache, or neck stiffness that could be associated with a meningoencephalitis, however. The patient will be maintained on his current dose of Lamictal, levels will be checked today, and he will followup in 6 months.  [name] Palau MD 02/20/2013 7:25 PM  [facility] [address] [place], [place] [address]  Phone [phone] Fax [phone]

## 2013-02-25 NOTE — Progress Notes (Signed)
Quick Note:  Shared lamotrigine level with patient thru VM message ______

## 2013-04-08 DIAGNOSIS — Z0289 Encounter for other administrative examinations: Secondary | ICD-10-CM

## 2013-04-18 ENCOUNTER — Encounter: Payer: Self-pay | Admitting: Neurology

## 2013-04-21 ENCOUNTER — Telehealth: Payer: Self-pay

## 2013-04-21 NOTE — Telephone Encounter (Signed)
I called and left VM for patient that I have faxed over letter to [facility]. I will send original letter to patient.

## 2013-04-21 NOTE — Telephone Encounter (Signed)
Encounter Complete.  

## 2013-05-03 ENCOUNTER — Other Ambulatory Visit: Payer: Self-pay | Admitting: Neurology

## 2013-05-08 ENCOUNTER — Encounter (INDEPENDENT_AMBULATORY_CARE_PROVIDER_SITE_OTHER): Payer: Self-pay | Admitting: General Surgery

## 2013-05-08 ENCOUNTER — Ambulatory Visit (INDEPENDENT_AMBULATORY_CARE_PROVIDER_SITE_OTHER): Payer: Medicare Other | Admitting: General Surgery

## 2013-05-08 VITALS — BP 124/80 | HR 86 | Temp 97.2°F | Resp 16 | Ht 72.0 in | Wt 224.0 lb

## 2013-05-08 DIAGNOSIS — L02219 Cutaneous abscess of trunk, unspecified: Secondary | ICD-10-CM

## 2013-05-08 DIAGNOSIS — L03319 Cellulitis of trunk, unspecified: Principal | ICD-10-CM

## 2013-05-08 MED ORDER — HYDROCODONE-ACETAMINOPHEN 5-325 MG PO TABS: 1.0000 | ORAL_TABLET | ORAL | Status: DC | PRN

## 2013-05-08 NOTE — Patient Instructions (Signed)
Removed bandage in the shower in 24 hours. Apply a dry bulky bandage to the area. Clean the area daily in the shower with warm water and apply a dry bandage to the area.  Clean  hands frequently with hand sensitizer.

## 2013-05-08 NOTE — Progress Notes (Signed)
Patient ID: [name] [name], male   DOB: [birth year], 33 y.o.   MRN: [id number]  Chief Complaint  Patient presents with  . Other    Eval abscess on rt side of chest    HPI [name] [name] is a 33 y.o. male.   HPI  He is referred by Dr. Lennette Bihari level for further evaluation and treatment of a right chest wall abscess. He noticed a small red area in the right chest wall 3 days ago. The redness has expanded and it has become painful. He had a similar area in the right leg that underwent to incision and drainages. He was given an antibiotic shot in Dr. Eddie Dibbles office and started on doxycycline.  Past Medical History  Diagnosis Date  . Seizures     last experienced a seizure x 3 years ago  . Migraine   . Hypertension   . Bipolar disorder   . Vitamin D deficiency   . Behcet's disease     Past Surgical History  Procedure Laterality Date  . Appendectomy    . Maxilofacial    . Fracture surgery Right     Arm    Family History  Problem Relation Age of Onset  . Lung cancer Paternal Grandfather     Social History History  Substance Use Topics  . Smoking status: Never Smoker   . Smokeless tobacco: Not on file  . Alcohol Use: No    No Known Allergies  Current Outpatient Prescriptions  Medication Sig Dispense Refill  . Alum & Mag Hydroxide-Simeth (MAGIC MOUTHWASH W/LIDOCAINE) SOLN Take 5 mLs by mouth 3 (three) times daily.  120 mL  0  . Alum & Mag Hydroxide-Simeth (MAGIC MOUTHWASH W/LIDOCAINE) SOLN Take 5 mLs by mouth 3 (three) times daily as needed for mouth pain.  100 mL  0  . atenolol (TENORMIN) 50 MG tablet Take 1 tablet (50 mg total) by mouth daily.  30 tablet  0  . clonazePAM (KLONOPIN) 0.5 MG tablet Take 1 tablet (0.5 mg total) by mouth 3 (three) times daily. For anxiety  90 tablet  0  . colchicine 0.6 MG tablet Take 1 tablet (0.6 mg total) by mouth daily.  30 tablet  0  . esomeprazole (NEXIUM) 40 MG capsule Take 1 capsule (40 mg total) by mouth daily at 12 noon.  30 capsule  0  .  Eszopiclone 3 MG TABS Take 1 tablet (3 mg total) by mouth at bedtime. Take immediately before bedtime  30 tablet  0  . HYDROcodone-acetaminophen (NORCO/VICODIN) 5-325 MG per tablet Take 1 tablet by mouth every 6 (six) hours as needed for moderate pain.  45 tablet  0  . HYDROmorphone (DILAUDID) 2 MG tablet Take 1 tablet (2 mg total) by mouth every 4 (four) hours as needed for severe pain.  60 tablet  0  . lamoTRIgine (LAMICTAL) 200 MG tablet TAKE 1 TABLET (200 MG TOTAL) BY MOUTH 2 (TWO) TIMES DAILY.  60 tablet  6  . lisdexamfetamine (VYVANSE) 60 MG capsule Take 1 capsule (60 mg total) by mouth daily at 12 noon.  30 capsule  0  . ondansetron (ZOFRAN) 4 MG tablet Take 1 tablet (4 mg total) by mouth every 6 (six) hours as needed for nausea.  45 tablet  0  . predniSONE (DELTASONE) 50 MG tablet Take 1 tablet (50 mg total) by mouth daily.  5 tablet  0  . QUEtiapine (SEROQUEL XR) 300 MG 24 hr tablet Take 1 tablet (300 mg total) by  mouth at bedtime.  30 tablet  0  . HYDROcodone-acetaminophen (NORCO/VICODIN) 5-325 MG per tablet Take 1-2 tablets by mouth every 4 (four) hours as needed.  30 tablet  0   No current facility-administered medications for this visit.    Review of Systems Review of Systems  Constitutional: Negative for fever and chills.  Respiratory:       Right chest wall pain at site of abscess.  Skin: Positive for rash.  Allergic/Immunologic: Positive for immunocompromised state.    Blood pressure 124/80, pulse 86, temperature 97.2 F (36.2 C), temperature source Oral, resp. rate 16, height 6' (1.829 m), weight 224 lb (101.606 kg).  Physical Exam Physical Exam  Constitutional: He appears well-developed and well-nourished. No distress.  HENT:  Head: Normocephalic and atraumatic.  Pulmonary/Chest:  At the 12:00 position there is an indurated area with surrounding erythema. 2 small puncture wounds with scabs on them are present.  It is tender and warm to the touch.  Musculoskeletal:   Dry would right leg with no erythema.    Data Reviewed None  Assessment    Right chest wall abscess. He is immunocompromised. Has Behcet's disease.     Plan    Incision and drainage of abscess. Continue oral antibiotics. Return visit 2 weeks.  Procedure: The right chest wall area around the abscess was sterilely prepped. He was anesthetized with Xylocaine with epinephrine. A triangular plug of full-thickness skin and subcutaneous tissue was removed over the indurated area. Bloody pus was evacuated. The wound tracked superiorly. The incision was extended a little bit superiorly. The wound cavity was then cleansed with peroxide. Hemostasis was obtained with direct pressure. Iodoform gauze was then packed into the wound followed by bulky dry dressing.  He tolerated the procedure well. He was given wound care instructions. He was told that if he has heavy bleeding from the area tonight to go to the emergency department.       Miklos Bidinger J 05/08/2013, 3:25 PM

## 2013-05-10 ENCOUNTER — Emergency Department (HOSPITAL_COMMUNITY)
Admission: EM | Admit: 2013-05-10 | Discharge: 2013-05-11 | Disposition: A | Discharge status: Home / Self Care | Payer: Medicare Other | Attending: Emergency Medicine | Admitting: Emergency Medicine

## 2013-05-10 DIAGNOSIS — R112 Nausea with vomiting, unspecified: Secondary | ICD-10-CM | POA: Insufficient documentation

## 2013-05-10 DIAGNOSIS — R5383 Other fatigue: Secondary | ICD-10-CM

## 2013-05-10 DIAGNOSIS — L03319 Cellulitis of trunk, unspecified: Secondary | ICD-10-CM

## 2013-05-10 DIAGNOSIS — L02219 Cutaneous abscess of trunk, unspecified: Secondary | ICD-10-CM | POA: Insufficient documentation

## 2013-05-10 DIAGNOSIS — G43909 Migraine, unspecified, not intractable, without status migrainosus: Secondary | ICD-10-CM | POA: Insufficient documentation

## 2013-05-10 DIAGNOSIS — R5381 Other malaise: Secondary | ICD-10-CM | POA: Insufficient documentation

## 2013-05-10 DIAGNOSIS — G40909 Epilepsy, unspecified, not intractable, without status epilepticus: Secondary | ICD-10-CM | POA: Insufficient documentation

## 2013-05-10 DIAGNOSIS — E559 Vitamin D deficiency, unspecified: Secondary | ICD-10-CM | POA: Insufficient documentation

## 2013-05-10 DIAGNOSIS — Z79899 Other long term (current) drug therapy: Secondary | ICD-10-CM | POA: Insufficient documentation

## 2013-05-10 DIAGNOSIS — F319 Bipolar disorder, unspecified: Secondary | ICD-10-CM | POA: Insufficient documentation

## 2013-05-10 DIAGNOSIS — I10 Essential (primary) hypertension: Secondary | ICD-10-CM | POA: Insufficient documentation

## 2013-05-10 DIAGNOSIS — L02213 Cutaneous abscess of chest wall: Secondary | ICD-10-CM

## 2013-05-10 DIAGNOSIS — R6883 Chills (without fever): Secondary | ICD-10-CM | POA: Insufficient documentation

## 2013-05-11 ENCOUNTER — Encounter (HOSPITAL_COMMUNITY): Payer: Self-pay | Admitting: Emergency Medicine

## 2013-05-11 LAB — URINALYSIS, ROUTINE W REFLEX MICROSCOPIC
Bilirubin Urine: NEGATIVE
GLUCOSE, UA: NEGATIVE mg/dL
Hgb urine dipstick: NEGATIVE
Ketones, ur: 15 mg/dL — AB
LEUKOCYTES UA: NEGATIVE
NITRITE: NEGATIVE
PH: 8 (ref 5.0–8.0)
Protein, ur: 30 mg/dL — AB
Specific Gravity, Urine: 1.023 (ref 1.005–1.030)
Urobilinogen, UA: 0.2 mg/dL (ref 0.0–1.0)

## 2013-05-11 LAB — URINE MICROSCOPIC-ADD ON

## 2013-05-11 LAB — COMPREHENSIVE METABOLIC PANEL
ALT: 147 U/L — AB (ref 0–53)
AST: 64 U/L — AB (ref 0–37)
Albumin: 4 g/dL (ref 3.5–5.2)
Alkaline Phosphatase: 94 U/L (ref 39–117)
BILIRUBIN TOTAL: 0.4 mg/dL (ref 0.3–1.2)
BUN: 13 mg/dL (ref 6–23)
CALCIUM: 9.1 mg/dL (ref 8.4–10.5)
CHLORIDE: 100 meq/L (ref 96–112)
CO2: 26 meq/L (ref 19–32)
Creatinine, Ser: 0.95 mg/dL (ref 0.50–1.35)
GFR calc Af Amer: >90 mL/min (ref >90)
GFR calc non Af Amer: >90 mL/min (ref >90)
Glucose, Bld: 123 mg/dL — ABNORMAL HIGH (ref 70–99)
Potassium: 3.8 mEq/L (ref 3.7–5.3)
SODIUM: 139 meq/L (ref 137–147)
Total Protein: 7.1 g/dL (ref 6.0–8.3)

## 2013-05-11 LAB — LIPASE, BLOOD: Lipase: 17 U/L (ref 11–59)

## 2013-05-11 LAB — CBC WITH DIFFERENTIAL/PLATELET
BASOS ABS: 0 10*3/uL (ref 0.0–0.1)
Basophils Relative: 0 % (ref 0–1)
Eosinophils Absolute: 0 10*3/uL (ref 0.0–0.7)
Eosinophils Relative: 0 % (ref 0–5)
HCT: 47.3 % (ref 39.0–52.0)
Hemoglobin: 16.6 g/dL (ref 13.0–17.0)
LYMPHS PCT: 13 % (ref 12–46)
Lymphs Abs: 1.7 10*3/uL (ref 0.7–4.0)
MCH: 32 pg (ref 26.0–34.0)
MCHC: 35.1 g/dL (ref 30.0–36.0)
MCV: 91.3 fL (ref 78.0–100.0)
Monocytes Absolute: 0.9 10*3/uL (ref 0.1–1.0)
Monocytes Relative: 7 % (ref 3–12)
Neutro Abs: 10 10*3/uL — ABNORMAL HIGH (ref 1.7–7.7)
Neutrophils Relative %: 79 % — ABNORMAL HIGH (ref 43–77)
Platelets: 246 10*3/uL (ref 150–400)
RBC: 5.18 MIL/uL (ref 4.22–5.81)
RDW: 12.8 % (ref 11.5–15.5)
WBC: 12.7 10*3/uL — AB (ref 4.0–10.5)

## 2013-05-11 MED ORDER — ONDANSETRON HCL 4 MG PO TABS: 4.0000 mg | ORAL_TABLET | Freq: Four times a day (QID) | ORAL | Status: DC

## 2013-05-11 MED ORDER — SODIUM CHLORIDE 0.9 % IV BOLUS (SEPSIS)
1000.0000 mL | Freq: Once | INTRAVENOUS | Status: AC
  Administered 2013-05-11: 1000 mL via INTRAVENOUS

## 2013-05-11 MED ORDER — MORPHINE SULFATE 4 MG/ML IJ SOLN
2.0000 mg | Freq: Once | INTRAMUSCULAR | Status: AC
  Administered 2013-05-11: 2 mg via INTRAVENOUS
  Filled 2013-05-11: qty 1

## 2013-05-11 MED ORDER — SODIUM CHLORIDE 0.9 % IV SOLN
1000.0000 mL | Freq: Once | INTRAVENOUS | Status: AC
  Administered 2013-05-11: 1000 mL via INTRAVENOUS

## 2013-05-11 MED ORDER — ONDANSETRON HCL 4 MG/2ML IJ SOLN
4.0000 mg | Freq: Once | INTRAMUSCULAR | Status: AC
  Administered 2013-05-11: 4 mg via INTRAVENOUS
  Filled 2013-05-11: qty 2

## 2013-05-11 MED ORDER — SODIUM CHLORIDE 0.9 % IV SOLN: 1000.0000 mL | INTRAVENOUS | Status: DC

## 2013-05-11 MED ORDER — ONDANSETRON HCL 4 MG/2ML IJ SOLN
4.0000 mg | Freq: Once | INTRAMUSCULAR | Status: AC
Start: 2013-05-11 — End: 2013-05-11

## 2013-05-11 MED ORDER — CLINDAMYCIN PHOSPHATE 600 MG/50ML IV SOLN
600.0000 mg | Freq: Once | INTRAVENOUS | Status: AC
  Administered 2013-05-11: 600 mg via INTRAVENOUS
  Filled 2013-05-11: qty 50

## 2013-05-11 MED ORDER — ONDANSETRON HCL 4 MG/2ML IJ SOLN
INTRAMUSCULAR | Status: AC
  Administered 2013-05-11: 4 mg
  Filled 2013-05-11: qty 2

## 2013-05-11 NOTE — ED Provider Notes (Signed)
CSN: [id number]     Arrival date & time 05/10/13  2344 History   First MD Initiated Contact with Patient 05/11/13 0116     Chief Complaint  Patient presents with  . Emesis   (Consider location/radiation/quality/duration/timing/severity/associated sxs/prior Treatment) HPI Comments: Patient is a 33 year old male with a history of Behcet's disease, hypertension, and bipolar disorder who presents to the emergency department for nausea and nonbloody, nonbilious emesis x2 days. Patient states that symptoms have been constant since onset without any modifying factors. Patient has not taken anything for symptoms. He states that he has had associated chills. Patient also stating that, "it feels like I have just been getting sicker since the abscess was drained". Patient saw Dr. Zella Richer of [facility] 4 days ago to have a right chest wall abscess drained. He states he is supposed to be on Keflex, but has only been able to keep 2 pills down over the last 4 days. Patient denies associated fever, chest pain or shortness of breath, abdominal pain, diarrhea, melena or hematochezia, hematemesis, urinary symptoms, and numbness/tingling.  Patient has monthly Remicade infusions and is also on methotrexate for his Behcet's disease.  Patient is a 33 y.o. male presenting with vomiting. The history is provided by the patient. No language interpreter was used.  Emesis Associated symptoms: chills and myalgias (R chest wall around draining abscess)   Associated symptoms: no abdominal pain and no diarrhea     Past Medical History  Diagnosis Date  . Seizures     last experienced a seizure x 3 years ago  . Migraine   . Hypertension   . Bipolar disorder   . Vitamin D deficiency   . Behcet's disease    Past Surgical History  Procedure Laterality Date  . Appendectomy    . Maxilofacial    . Fracture surgery Right     Arm   Family History  Problem Relation Age of Onset  . Lung cancer Paternal  Grandfather    History  Substance Use Topics  . Smoking status: Never Smoker   . Smokeless tobacco: Not on file  . Alcohol Use: No    Review of Systems  Constitutional: Positive for chills and fatigue. Negative for fever.  Respiratory: Negative for shortness of breath.   Cardiovascular: Negative for chest pain.  Gastrointestinal: Positive for nausea and vomiting. Negative for abdominal pain, diarrhea and blood in stool.  Genitourinary: Negative for dysuria.  Musculoskeletal: Positive for myalgias (R chest wall around draining abscess).  Neurological: Negative for weakness and numbness.  All other systems reviewed and are negative.    Allergies  Review of patient's allergies indicates no known allergies.  Home Medications   Current Outpatient Rx  Name  Route  Sig  Dispense  Refill  . atenolol (TENORMIN) 50 MG tablet   Oral   Take 1 tablet (50 mg total) by mouth daily.   30 tablet   0   . calcium gluconate 500 MG tablet   Oral   Take 1 tablet by mouth daily.         . clonazePAM (KLONOPIN) 0.5 MG tablet   Oral   Take 1 tablet (0.5 mg total) by mouth 3 (three) times daily. For anxiety   90 tablet   0   . doxycycline (VIBRAMYCIN) 100 MG capsule   Oral   Take 100 mg by mouth.          . folic acid (FOLVITE) 1 MG tablet   Oral  Take 1 mg by mouth daily.          Marland Kitchen HYDROcodone-acetaminophen (NORCO/VICODIN) 5-325 MG per tablet   Oral   Take 1-2 tablets by mouth every 4 (four) hours as needed.   30 tablet   0   . hydrOXYzine (VISTARIL) 50 MG capsule   Oral   Take 50 mg by mouth at bedtime.          . lamoTRIgine (LAMICTAL) 200 MG tablet      TAKE 1 TABLET (200 MG TOTAL) BY MOUTH 2 (TWO) TIMES DAILY.   60 tablet   6   . lisdexamfetamine (VYVANSE) 60 MG capsule   Oral   Take 1 capsule (60 mg total) by mouth daily at 12 noon.   30 capsule   0   . methotrexate (RHEUMATREX) 2.5 MG tablet   Oral   Take 12.5 mg by mouth once a week.          Marland Kitchen  QUEtiapine (SEROQUEL XR) 300 MG 24 hr tablet   Oral   Take 1 tablet (300 mg total) by mouth at bedtime.   30 tablet   0   . venlafaxine XR (EFFEXOR-XR) 150 MG 24 hr capsule   Oral   Take 150 mg by mouth daily with breakfast.          . Vitamin D, Ergocalciferol, (DRISDOL) 50000 UNITS CAPS capsule   Oral   Take 50,000 Units by mouth every 7 (seven) days.         Marland Kitchen esomeprazole (NEXIUM) 40 MG capsule   Oral   Take 1 capsule (40 mg total) by mouth daily at 12 noon.   30 capsule   0   . ondansetron (ZOFRAN) 4 MG tablet   Oral   Take 1 tablet (4 mg total) by mouth every 6 (six) hours.   12 tablet   0    BP 128/72  Pulse 86  Temp(Src) 98.2 F (36.8 C) (Oral)  Resp 14  SpO2 98%  Physical Exam  Nursing note and vitals reviewed. Constitutional: He is oriented to person, place, and time. He appears well-developed and well-nourished. No distress.  HENT:  Head: Normocephalic and atraumatic.  Eyes: Conjunctivae and EOM are normal. Pupils are equal, round, and reactive to light. No scleral icterus.  Neck: Normal range of motion.  Cardiovascular: Normal rate, regular rhythm and normal heart sounds.   Pulmonary/Chest: Effort normal and breath sounds normal. No respiratory distress. He has no wheezes. He has no rales.  +incised abscess of R chest wall; draining scant amount of purulent fluid. Mild surrounding blanching erythema extending to R nipple without linear streaking.  Abdominal: Soft. He exhibits no distension. There is no tenderness. There is no rebound and no guarding.  Musculoskeletal: Normal range of motion.  Neurological: He is alert and oriented to person, place, and time.  Skin: Skin is warm and dry. No rash noted. He is not diaphoretic. No erythema.  Patient appears slightly pale appearing  Psychiatric: He has a normal mood and affect. His behavior is normal.    ED Course  Procedures (including critical care time) Labs Review Labs Reviewed  CBC WITH  DIFFERENTIAL - Abnormal; Notable for the following:    WBC 12.7 (*)    Neutrophils Relative % 79 (*)    Neutro Abs 10.0 (*)    All other components within normal limits  URINALYSIS, ROUTINE W REFLEX MICROSCOPIC - Abnormal; Notable for the following:    APPearance TURBID (*)  Ketones, ur 15 (*)    Protein, ur 30 (*)    All other components within normal limits  COMPREHENSIVE METABOLIC PANEL - Abnormal; Notable for the following:    Glucose, Bld 123 (*)    AST 64 (*)    ALT 147 (*)    All other components within normal limits  LIPASE, BLOOD  URINE MICROSCOPIC-ADD ON   Imaging Review No results found.  EKG Interpretation   None       MDM   1. Nausea and vomiting   2. Chest wall abscess    0200 - Patient presents for nausea and nonbloody, nonbilious emesis x2 days. He endorses a recently drained chest wall abscess on right by [facility], but has been unable to tolerate PO abx secondary to symptoms. Patient nontoxic appearing and hemodynamically stable as well as afebrile. Abdomen nontender on exam; no peritoneal signs. Will obtain labs, hydrate, and reassess.  0325 - Patient with mild leukocytosis today, stable over the last 3 months. LFTs also mildly elevated, but patient without icterus, jaundice, abdominal tenderness, or palpable hepatomegaly. No electrolyte and bouncing kidney function preserved. Urinalysis suggests dehydration. Patient hydrated today with 2 L IV fluids. I also treated with clindamycin given history of Behcet's disease and his inability to tolerate antibiotics since abscess drainage. Patient with no complaints of abdominal pain. Will PO challenge.  3149 - Abdominal reexaminations of her ED course have been stable. Patient continues to endorse no abdominal tenderness. He is tolerating fluids by mouth without emesis. He denies any nausea at present. Patient has remained hemodynamically stable over ED course. Given his improvement in symptoms, believe he is stable for  discharge with instruction to follow up with his primary care provider. Also advised he followup with [facility] regarding his recently drained abscess. Will prescribe Zofran for outpatient management of symptoms. Return precautions discussed and patient agreeable to plan with no unaddressed concerns.   Antonietta Breach, PA-C 05/16/13 [phone]

## 2013-05-11 NOTE — Discharge Instructions (Signed)
Takes Zofran as needed for persistent nausea/vomiting. Refrain from eating fatty, greasy, or processed foods as well as milk products until symptoms resolve. Recommend also that you followup with general surgery on Monday for a recheck of your abscess as well as your primary care doctor to ensure the symptoms resolve. Take your antibiotics as prescribed by [facility]. Return to the emergency department if symptoms worsen.  Nausea and Vomiting Nausea is a sick feeling that often comes before throwing up (vomiting). Vomiting is a reflex where stomach contents come out of your mouth. Vomiting can cause severe loss of body fluids (dehydration). Children and elderly adults can become dehydrated quickly, especially if they also have diarrhea. Nausea and vomiting are symptoms of a condition or disease. It is important to find the cause of your symptoms. CAUSES   Direct irritation of the stomach lining. This irritation can result from increased acid production (gastroesophageal reflux disease), infection, food poisoning, taking certain medicines (such as nonsteroidal anti-inflammatory drugs), alcohol use, or tobacco use.  Signals from the brain.These signals could be caused by a headache, heat exposure, an inner ear disturbance, increased pressure in the brain from injury, infection, a tumor, or a concussion, pain, emotional stimulus, or metabolic problems.  An obstruction in the gastrointestinal tract (bowel obstruction).  Illnesses such as diabetes, hepatitis, gallbladder problems, appendicitis, kidney problems, cancer, sepsis, atypical symptoms of a heart attack, or eating disorders.  Medical treatments such as chemotherapy and radiation.  Receiving medicine that makes you sleep (general anesthetic) during surgery. DIAGNOSIS Your caregiver may ask for tests to be done if the problems do not improve after a few days. Tests may also be done if symptoms are severe or if the reason for the  nausea and vomiting is not clear. Tests may include:  Urine tests.  Blood tests.  Stool tests.  Cultures (to look for evidence of infection).  X-rays or other imaging studies. Test results can help your caregiver make decisions about treatment or the need for additional tests. TREATMENT You need to stay well hydrated. Drink frequently but in small amounts.You may wish to drink water, sports drinks, clear broth, or eat frozen ice pops or gelatin dessert to help stay hydrated.When you eat, eating slowly may help prevent nausea.There are also some antinausea medicines that may help prevent nausea. HOME CARE INSTRUCTIONS   Take all medicine as directed by your caregiver.  If you do not have an appetite, do not force yourself to eat. However, you must continue to drink fluids.  If you have an appetite, eat a normal diet unless your caregiver tells you differently.  Eat a variety of complex carbohydrates (rice, wheat, potatoes, bread), lean meats, yogurt, fruits, and vegetables.  Avoid high-fat foods because they are more difficult to digest.  Drink enough water and fluids to keep your urine clear or pale yellow.  If you are dehydrated, ask your caregiver for specific rehydration instructions. Signs of dehydration may include:  Severe thirst.  Dry lips and mouth.  Dizziness.  Dark urine.  Decreasing urine frequency and amount.  Confusion.  Rapid breathing or pulse. SEEK IMMEDIATE MEDICAL CARE IF:   You have blood or brown flecks (like coffee grounds) in your vomit.  You have black or bloody stools.  You have a severe headache or stiff neck.  You are confused.  You have severe abdominal pain.  You have chest pain or trouble breathing.  You do not urinate at least once every 8 hours.  You develop  cold or clammy skin.  You continue to vomit for longer than 24 to 48 hours.  You have a fever. MAKE SURE YOU:   Understand these instructions.  Will watch your  condition.  Will get help right away if you are not doing well or get worse. Document Released: 03/20/2005 Document Revised: 06/12/2011 Document Reviewed: 08/17/2010 [facility] Patient Information 2014 [place], [place].

## 2013-05-11 NOTE — ED Notes (Signed)
Pt arrived to the ED with a complaint of emesis that started last night.  Pt states he had an abscess removed from his chest and he hasn't been well since.

## 2013-05-16 NOTE — ED Provider Notes (Signed)
Medical screening examination/treatment/procedure(s) were performed by non-physician practitioner and as supervising physician I was immediately available for consultation/collaboration.   Teressa Lower, MD 05/16/13 [phone]

## 2013-05-21 ENCOUNTER — Ambulatory Visit: Payer: Medicare Other | Admitting: Neurology

## 2013-05-22 ENCOUNTER — Ambulatory Visit (INDEPENDENT_AMBULATORY_CARE_PROVIDER_SITE_OTHER): Payer: Medicare Other | Admitting: General Surgery

## 2013-05-22 ENCOUNTER — Encounter (INDEPENDENT_AMBULATORY_CARE_PROVIDER_SITE_OTHER): Payer: Self-pay | Admitting: General Surgery

## 2013-05-22 VITALS — BP 110/78 | HR 64 | Temp 97.4°F | Resp 14

## 2013-05-22 DIAGNOSIS — L02219 Cutaneous abscess of trunk, unspecified: Secondary | ICD-10-CM

## 2013-05-22 DIAGNOSIS — L03319 Cellulitis of trunk, unspecified: Principal | ICD-10-CM

## 2013-05-22 NOTE — Progress Notes (Signed)
Subjective:     Patient ID: [name] [name], male   DOB: 1980-11-07, 33 y.o.   MRN: [id number]  HPI  He is here for a wound check after incision and drainage of her right chest wall abscess. He is showering twice a day and changing the bandage. Still having a little drainage.   Review of Systems  He is aware that he is immunosuppressed and at risk for this happening again.     Objective:   Physical Exam Gen.-he is in no acute distress.  Chest-there is a superficial wound in the right upper chest wall with no surrounding erythema. This is significantly improved.    Assessment:     Right chest wall abscess that has responded well to incision and drainage and antibiotics.     Plan:     I recommended using Hibiclens soap once the wound is healed and we went over the directions on this. We talked about washing clothes in hot water. Return visit as needed.

## 2013-05-22 NOTE — Patient Instructions (Signed)
After the wound has completely healed, shower daily using Hibiclens soap for one week. Following that, shower using Hibiclens soap once a week. If you can, try to clean clothes in hot water.

## 2013-07-02 ENCOUNTER — Encounter (HOSPITAL_COMMUNITY): Payer: Self-pay | Admitting: Emergency Medicine

## 2013-07-02 ENCOUNTER — Emergency Department (HOSPITAL_COMMUNITY)
Admission: EM | Admit: 2013-07-02 | Discharge: 2013-07-02 | Disposition: A | Discharge status: Home / Self Care | Payer: Medicare Other | Attending: Emergency Medicine | Admitting: Emergency Medicine

## 2013-07-02 DIAGNOSIS — G43909 Migraine, unspecified, not intractable, without status migrainosus: Secondary | ICD-10-CM | POA: Insufficient documentation

## 2013-07-02 DIAGNOSIS — G40909 Epilepsy, unspecified, not intractable, without status epilepticus: Secondary | ICD-10-CM | POA: Insufficient documentation

## 2013-07-02 DIAGNOSIS — F319 Bipolar disorder, unspecified: Secondary | ICD-10-CM | POA: Insufficient documentation

## 2013-07-02 DIAGNOSIS — L02411 Cutaneous abscess of right axilla: Secondary | ICD-10-CM

## 2013-07-02 DIAGNOSIS — Z79899 Other long term (current) drug therapy: Secondary | ICD-10-CM | POA: Insufficient documentation

## 2013-07-02 DIAGNOSIS — IMO0002 Reserved for concepts with insufficient information to code with codable children: Secondary | ICD-10-CM | POA: Insufficient documentation

## 2013-07-02 DIAGNOSIS — I10 Essential (primary) hypertension: Secondary | ICD-10-CM | POA: Insufficient documentation

## 2013-07-02 DIAGNOSIS — E559 Vitamin D deficiency, unspecified: Secondary | ICD-10-CM | POA: Insufficient documentation

## 2013-07-02 DIAGNOSIS — Z8619 Personal history of other infectious and parasitic diseases: Secondary | ICD-10-CM | POA: Insufficient documentation

## 2013-07-02 DIAGNOSIS — Z792 Long term (current) use of antibiotics: Secondary | ICD-10-CM | POA: Insufficient documentation

## 2013-07-02 MED ORDER — IBUPROFEN 200 MG PO TABS
600.0000 mg | ORAL_TABLET | Freq: Once | ORAL | Status: AC
  Administered 2013-07-02: 600 mg via ORAL
  Filled 2013-07-02: qty 3

## 2013-07-02 MED ORDER — HYDROCODONE-ACETAMINOPHEN 5-325 MG PO TABS: 1.0000 | ORAL_TABLET | Freq: Four times a day (QID) | ORAL | Status: DC | PRN

## 2013-07-02 NOTE — Discharge Instructions (Signed)
Keep area very clean. Change dressing/gauze daily. Follow up with your doctor, or urgent care, in 2 days time for wound check and packing removal. Continue your antibiotic. Take motrin or aleve as need for pain. You may also take hydrocodone as need for pain. No driving when taking hydrocodone. Also, do not take tylenol or acetaminophen containing medication when taking hydrocodone.   Return to ER if worse, new symptoms, fevers, spreading redness, other concern.     Abscess An abscess is an infected area that contains a collection of pus and debris.It can occur in almost any part of the body. An abscess is also known as a furuncle or boil. CAUSES  An abscess occurs when tissue gets infected. This can occur from blockage of oil or sweat glands, infection of hair follicles, or a minor injury to the skin. As the body tries to fight the infection, pus collects in the area and creates pressure under the skin. This pressure causes pain. People with weakened immune systems have difficulty fighting infections and get certain abscesses more often.  SYMPTOMS Usually an abscess develops on the skin and becomes a painful mass that is red, warm, and tender. If the abscess forms under the skin, you may feel a moveable soft area under the skin. Some abscesses break open (rupture) on their own, but most will continue to get worse without care. The infection can spread deeper into the body and eventually into the bloodstream, causing you to feel ill.  DIAGNOSIS  Your caregiver will take your medical history and perform a physical exam. A sample of fluid may also be taken from the abscess to determine what is causing your infection. TREATMENT  Your caregiver may prescribe antibiotic medicines to fight the infection. However, taking antibiotics alone usually does not cure an abscess. Your caregiver may need to make a small cut (incision) in the abscess to drain the pus. In some cases, gauze is packed into the  abscess to reduce pain and to continue draining the area. HOME CARE INSTRUCTIONS   Only take over-the-counter or prescription medicines for pain, discomfort, or fever as directed by your caregiver.  If you were prescribed antibiotics, take them as directed. Finish them even if you start to feel better.  If gauze is used, follow your caregiver's directions for changing the gauze.  To avoid spreading the infection:  Keep your draining abscess covered with a bandage.  Wash your hands well.  Do not share personal care items, towels, or whirlpools with others.  Avoid skin contact with others.  Keep your skin and clothes clean around the abscess.  Keep all follow-up appointments as directed by your caregiver. SEEK MEDICAL CARE IF:   You have increased pain, swelling, redness, fluid drainage, or bleeding.  You have muscle aches, chills, or a general ill feeling.  You have a fever. MAKE SURE YOU:   Understand these instructions.  Will watch your condition.  Will get help right away if you are not doing well or get worse. Document Released: 12/28/2004 Document Revised: 09/19/2011 Document Reviewed: 06/02/2011 [facility] Patient Information 2014 [name] [name].

## 2013-07-02 NOTE — ED Notes (Signed)
Pt a+OX4, presents with c/o abscesses to R axilla since today, worsening.  Pt reports was seen by PCP earlier today and sores are worsening since.  Pt reports taking abx on a daily basis for prophylaxis.  Pt reports subjective fevers.  Pt reports 5/10 pain to R axilla.  3 quarter sized red, swollen areas noted.  Pt also reports sores in his mouth.  Speaking full/clear sentences, rr even/un-lab.  NAD.

## 2013-07-02 NOTE — ED Provider Notes (Signed)
CSN: [id number]     Arrival date & time 07/02/13  1639 History   First MD Initiated Contact with Patient 07/02/13 1719     Chief Complaint  Patient presents with  . Cellulitis     (Consider location/radiation/quality/duration/timing/severity/associated sxs/prior Treatment) The history is provided by the patient.  pt with hx Behcets disease, c/o few tender, swollen, erythematous areas to right axilla in the past few days. Constant, dull, pain to lesions worse w palpation. Pt notes has had prior abscesses that did require I and D ?hx mrsa. Is on doxycycline prophylacticly. Denies fever or chills. No nv. Does not feel ill or sick.  w his Behcets, has chronically recurrent oral ulcers and occasional other skin lesions, states feels that is at baseline.      Past Medical History  Diagnosis Date  . Seizures     last experienced a seizure x 3 years ago  . Migraine   . Hypertension   . Bipolar disorder   . Vitamin D deficiency   . Behcet's disease    Past Surgical History  Procedure Laterality Date  . Appendectomy    . Maxilofacial    . Fracture surgery Right     Arm   Family History  Problem Relation Age of Onset  . Lung cancer Paternal Grandfather    History  Substance Use Topics  . Smoking status: Never Smoker   . Smokeless tobacco: Not on file  . Alcohol Use: No    Review of Systems  Constitutional: Negative for fever and chills.  HENT: Negative for sore throat.   Respiratory: Negative for cough and shortness of breath.   Gastrointestinal: Negative for vomiting.  Musculoskeletal: Negative for neck pain and neck stiffness.  Skin: Negative for rash.  Neurological: Negative for headaches.      Allergies  Review of patient's allergies indicates no known allergies.  Home Medications   Current Outpatient Rx  Name  Route  Sig  Dispense  Refill  . atenolol (TENORMIN) 50 MG tablet   Oral   Take 1 tablet (50 mg total) by mouth daily.   30 tablet   0   . ceFAZolin  (ANCEF) 10 G injection   Intramuscular   Inject into the muscle once. Pt had ancef shot at 2 pm today. Pt unsure of dose.         . clonazePAM (KLONOPIN) 0.5 MG tablet   Oral   Take 1 tablet (0.5 mg total) by mouth 3 (three) times daily. For anxiety   90 tablet   0   . doxycycline (VIBRAMYCIN) 100 MG capsule   Oral   Take 100 mg by mouth 2 (two) times daily.          . folic acid (FOLVITE) 1 MG tablet   Oral   Take 1 mg by mouth daily.          . hydrOXYzine (VISTARIL) 50 MG capsule   Oral   Take 50 mg by mouth at bedtime.          . lamoTRIgine (LAMICTAL) 200 MG tablet      TAKE 1 TABLET (200 MG TOTAL) BY MOUTH 2 (TWO) TIMES DAILY.   60 tablet   6   . lisdexamfetamine (VYVANSE) 60 MG capsule   Oral   Take 1 capsule (60 mg total) by mouth daily at 12 noon.   30 capsule   0   . methotrexate (RHEUMATREX) 2.5 MG tablet   Oral   Take 17.5  mg by mouth once a week. Takes on Tuesday nights.         Marland Kitchen QUEtiapine (SEROQUEL XR) 300 MG 24 hr tablet   Oral   Take 1 tablet (300 mg total) by mouth at bedtime.   30 tablet   0   . venlafaxine XR (EFFEXOR-XR) 150 MG 24 hr capsule   Oral   Take 150 mg by mouth daily with breakfast.          . Vitamin D, Ergocalciferol, (DRISDOL) 50000 UNITS CAPS capsule   Oral   Take 50,000 Units by mouth every 7 (seven) days.          BP 141/63  Pulse 104  Temp(Src) 98.1 F (36.7 C) (Oral)  Resp 18  SpO2 99% Physical Exam  Nursing note and vitals reviewed. Constitutional: He is oriented to person, place, and time. He appears well-developed and well-nourished. No distress.  HENT:  Mouth/Throat: Oropharynx is clear and moist.  Few small aphthous mouth sores.   Eyes: Conjunctivae are normal.  Neck: Neck supple. No tracheal deviation present.  Cardiovascular: Normal rate, regular rhythm, normal heart sounds and intact distal pulses.   Pulmonary/Chest: Effort normal and breath sounds normal. No accessory muscle usage. No  respiratory distress.  Abdominal: He exhibits no distension.  Musculoskeletal: Normal range of motion. He exhibits no edema.  Few small, 1-2 cm diameter abscesses to right axilla. No cellulitis.   Lymphadenopathy:    He has no cervical adenopathy.  Neurological: He is alert and oriented to person, place, and time.  Skin: Skin is warm and dry. No rash noted.  Psychiatric: He has a normal mood and affect.    ED Course  Procedures (including critical care time)  MDM   Small abscesses to right axilla.   Reviewed nursing notes and prior charts for additional history.   Pt drove self.   Motrin po.  INCISION AND DRAINAGE Performed by: Mirna Mires Consent: Verbal consent obtained. Risks and benefits: risks, benefits and alternatives were discussed Type: abscess  Body area: right axilla x 3  Anesthesia: local infiltration  Incision was made with a scalpel.  Local anesthetic: lidocaine 2% w epinephrine  Anesthetic total: 6 ml  Complexity: complex Blunt dissection to break up loculations  Drainage: purulent  Drainage amount: moderate  Packing material: 1/4 in iodoform gauze  Patient tolerance: Patient tolerated the procedure well with no immediate complications.  Sterile dressing.  Pt requests pain rx for home.       Mirna Mires, MD 07/02/13 [id number]

## 2013-07-03 ENCOUNTER — Encounter (INDEPENDENT_AMBULATORY_CARE_PROVIDER_SITE_OTHER): Payer: Medicare Other | Admitting: Surgery

## 2013-07-03 ENCOUNTER — Encounter (INDEPENDENT_AMBULATORY_CARE_PROVIDER_SITE_OTHER): Payer: Self-pay | Admitting: Surgery

## 2013-08-15 ENCOUNTER — Emergency Department (HOSPITAL_COMMUNITY)
Admission: EM | Admit: 2013-08-15 | Discharge: 2013-08-15 | Disposition: A | Discharge status: Home / Self Care | Payer: Medicare Other | Attending: Emergency Medicine | Admitting: Emergency Medicine

## 2013-08-15 ENCOUNTER — Encounter (HOSPITAL_COMMUNITY): Payer: Self-pay | Admitting: Emergency Medicine

## 2013-08-15 DIAGNOSIS — Z792 Long term (current) use of antibiotics: Secondary | ICD-10-CM | POA: Insufficient documentation

## 2013-08-15 DIAGNOSIS — Z79899 Other long term (current) drug therapy: Secondary | ICD-10-CM | POA: Insufficient documentation

## 2013-08-15 DIAGNOSIS — F319 Bipolar disorder, unspecified: Secondary | ICD-10-CM | POA: Insufficient documentation

## 2013-08-15 DIAGNOSIS — G40909 Epilepsy, unspecified, not intractable, without status epilepticus: Secondary | ICD-10-CM | POA: Insufficient documentation

## 2013-08-15 DIAGNOSIS — R1110 Vomiting, unspecified: Secondary | ICD-10-CM

## 2013-08-15 DIAGNOSIS — G43909 Migraine, unspecified, not intractable, without status migrainosus: Secondary | ICD-10-CM | POA: Insufficient documentation

## 2013-08-15 DIAGNOSIS — M352 Behcet's disease: Secondary | ICD-10-CM | POA: Insufficient documentation

## 2013-08-15 DIAGNOSIS — I10 Essential (primary) hypertension: Secondary | ICD-10-CM | POA: Insufficient documentation

## 2013-08-15 LAB — COMPREHENSIVE METABOLIC PANEL
ALK PHOS: 113 U/L (ref 39–117)
ALT: 43 U/L (ref 0–53)
AST: 23 U/L (ref 0–37)
Albumin: 4.4 g/dL (ref 3.5–5.2)
BILIRUBIN TOTAL: 0.5 mg/dL (ref 0.3–1.2)
BUN: 13 mg/dL (ref 6–23)
CHLORIDE: 99 meq/L (ref 96–112)
CO2: 26 meq/L (ref 19–32)
Calcium: 9.9 mg/dL (ref 8.4–10.5)
Creatinine, Ser: 1.03 mg/dL (ref 0.50–1.35)
GLUCOSE: 112 mg/dL — AB (ref 70–99)
POTASSIUM: 3.4 meq/L — AB (ref 3.7–5.3)
SODIUM: 139 meq/L (ref 137–147)
TOTAL PROTEIN: 8 g/dL (ref 6.0–8.3)

## 2013-08-15 LAB — URINALYSIS, ROUTINE W REFLEX MICROSCOPIC
BILIRUBIN URINE: NEGATIVE
Glucose, UA: NEGATIVE mg/dL
HGB URINE DIPSTICK: NEGATIVE
KETONES UR: NEGATIVE mg/dL
Leukocytes, UA: NEGATIVE
Nitrite: NEGATIVE
Protein, ur: 30 mg/dL — AB
Specific Gravity, Urine: 1.028 (ref 1.005–1.030)
UROBILINOGEN UA: 1 mg/dL (ref 0.0–1.0)
pH: 6.5 (ref 5.0–8.0)

## 2013-08-15 LAB — CBC WITH DIFFERENTIAL/PLATELET
Basophils Absolute: 0 10*3/uL (ref 0.0–0.1)
Basophils Relative: 0 % (ref 0–1)
Eosinophils Absolute: 0 10*3/uL (ref 0.0–0.7)
Eosinophils Relative: 0 % (ref 0–5)
HCT: 45.2 % (ref 39.0–52.0)
Hemoglobin: 15.6 g/dL (ref 13.0–17.0)
LYMPHS PCT: 20 % (ref 12–46)
Lymphs Abs: 1.7 10*3/uL (ref 0.7–4.0)
MCH: 32.2 pg (ref 26.0–34.0)
MCHC: 34.5 g/dL (ref 30.0–36.0)
MCV: 93.2 fL (ref 78.0–100.0)
Monocytes Absolute: 0.9 10*3/uL (ref 0.1–1.0)
Monocytes Relative: 10 % (ref 3–12)
NEUTROS PCT: 70 % (ref 43–77)
Neutro Abs: 5.9 10*3/uL (ref 1.7–7.7)
PLATELETS: 207 10*3/uL (ref 150–400)
RBC: 4.85 MIL/uL (ref 4.22–5.81)
RDW: 12.4 % (ref 11.5–15.5)
WBC: 8.5 10*3/uL (ref 4.0–10.5)

## 2013-08-15 LAB — URINE MICROSCOPIC-ADD ON

## 2013-08-15 LAB — LIPASE, BLOOD: Lipase: 23 U/L (ref 11–59)

## 2013-08-15 MED ORDER — METHYLPREDNISOLONE SODIUM SUCC 125 MG IJ SOLR
125.0000 mg | Freq: Once | INTRAMUSCULAR | Status: AC
  Administered 2013-08-15: 125 mg via INTRAVENOUS
  Filled 2013-08-15: qty 2

## 2013-08-15 MED ORDER — TRIAMCINOLONE ACETONIDE 0.1 % MT PSTE: 1.0000 "application " | PASTE | Freq: Two times a day (BID) | OROMUCOSAL | Status: DC

## 2013-08-15 MED ORDER — PREDNISONE 20 MG PO TABS: ORAL_TABLET | ORAL | Status: DC

## 2013-08-15 MED ORDER — SODIUM CHLORIDE 0.9 % IV BOLUS (SEPSIS)
1000.0000 mL | Freq: Once | INTRAVENOUS | Status: AC
  Administered 2013-08-15: 1000 mL via INTRAVENOUS

## 2013-08-15 MED ORDER — OXYCODONE-ACETAMINOPHEN 5-325 MG PO TABS: 2.0000 | ORAL_TABLET | ORAL | Status: DC | PRN

## 2013-08-15 MED ORDER — ONDANSETRON HCL 4 MG PO TABS: 4.0000 mg | ORAL_TABLET | Freq: Four times a day (QID) | ORAL | Status: DC

## 2013-08-15 MED ORDER — ONDANSETRON HCL 4 MG/2ML IJ SOLN
4.0000 mg | Freq: Once | INTRAMUSCULAR | Status: AC
  Administered 2013-08-15: 4 mg via INTRAVENOUS
  Filled 2013-08-15: qty 2

## 2013-08-15 NOTE — ED Notes (Signed)
Pt has Behcet's disease.  States that he gets an outbreak about once a month.  States that he has been having NV and sores in mouth x 2 days.

## 2013-08-15 NOTE — ED Provider Notes (Signed)
CSN: [id number]     Arrival date & time 08/15/13  1808 History   First MD Initiated Contact with Patient 08/15/13 1821     Chief Complaint  Patient presents with  . Nausea  . Emesis  . Jaw Pain     (Consider location/radiation/quality/duration/timing/severity/associated sxs/prior Treatment) HPI Comments: Patient presents to the ER for evaluation of nausea, vomiting and now is worse. Patient reports a history of Behcet's Disease with frequent similar flares. She has not had any fever. He has not been able to hold down any solids or liquids today because of vomiting. There is diffuse abdominal cramping which is typical for him. No diarrhea.  Patient is a 33 y.o. male presenting with vomiting.  Emesis   Past Medical History  Diagnosis Date  . Seizures     last experienced a seizure x 3 years ago  . Migraine   . Hypertension   . Bipolar disorder   . Vitamin D deficiency   . Behcet's disease    Past Surgical History  Procedure Laterality Date  . Appendectomy    . Maxilofacial    . Fracture surgery Right     Arm   Family History  Problem Relation Age of Onset  . Lung cancer Paternal Grandfather    History  Substance Use Topics  . Smoking status: Never Smoker   . Smokeless tobacco: Not on file  . Alcohol Use: No    Review of Systems  Constitutional: Negative for fever.  HENT: Positive for mouth sores.   Gastrointestinal: Positive for nausea and vomiting.  All other systems reviewed and are negative.     Allergies  Review of patient's allergies indicates no known allergies.  Home Medications   Prior to Admission medications   Medication Sig Start Date End Date Taking? Authorizing Provider  atenolol (TENORMIN) 50 MG tablet Take 1 tablet (50 mg total) by mouth daily. 02/16/13   Robbie Lis, MD  ceFAZolin (ANCEF) 10 G injection Inject into the muscle once. Pt had ancef shot at 2 pm today. Pt unsure of dose.    Historical Provider, MD  clonazePAM (KLONOPIN) 0.5  MG tablet Take 1 tablet (0.5 mg total) by mouth 3 (three) times daily. For anxiety 02/16/13   Robbie Lis, MD  doxycycline (VIBRAMYCIN) 100 MG capsule Take 100 mg by mouth 2 (two) times daily.  04/30/13   Historical Provider, MD  folic acid (FOLVITE) 1 MG tablet Take 1 mg by mouth daily.  04/07/13   Historical Provider, MD  HYDROcodone-acetaminophen (NORCO/VICODIN) 5-325 MG per tablet Take 1-2 tablets by mouth every 6 (six) hours as needed for moderate pain. 07/02/13   Mirna Mires, MD  hydrOXYzine (VISTARIL) 50 MG capsule Take 50 mg by mouth at bedtime.  05/03/13   Historical Provider, MD  lamoTRIgine (LAMICTAL) 200 MG tablet TAKE 1 TABLET (200 MG TOTAL) BY MOUTH 2 (TWO) TIMES DAILY. 05/03/13   Kathrynn Ducking, MD  lisdexamfetamine (VYVANSE) 60 MG capsule Take 1 capsule (60 mg total) by mouth daily at 12 noon. 02/16/13   Robbie Lis, MD  methotrexate (RHEUMATREX) 2.5 MG tablet Take 17.5 mg by mouth once a week. Takes on Tuesday nights. 05/03/13   Historical Provider, MD  QUEtiapine (SEROQUEL XR) 300 MG 24 hr tablet Take 1 tablet (300 mg total) by mouth at bedtime. 02/16/13   Robbie Lis, MD  venlafaxine XR (EFFEXOR-XR) 150 MG 24 hr capsule Take 150 mg by mouth daily with breakfast.  05/03/13  Historical Provider, MD  Vitamin D, Ergocalciferol, (DRISDOL) 50000 UNITS CAPS capsule Take 50,000 Units by mouth every 7 (seven) days.    Historical Provider, MD   BP 139/78  Pulse 88  Temp(Src) 98 F (36.7 C) (Oral)  Resp 14  SpO2 97% Physical Exam  Constitutional: He is oriented to person, place, and time. He appears well-developed and well-nourished. No distress.  HENT:  Head: Normocephalic and atraumatic.  Right Ear: Hearing normal.  Left Ear: Hearing normal.  Nose: Nose normal.  Mouth/Throat: Oropharynx is clear and moist and mucous membranes are normal. No dental abscesses or dental caries.    Eyes: Conjunctivae and EOM are normal. Pupils are equal, round, and reactive to light.  Neck:  Normal range of motion. Neck supple.  Cardiovascular: Regular rhythm, S1 normal and S2 normal.  Exam reveals no gallop and no friction rub.   No murmur heard. Pulmonary/Chest: Effort normal and breath sounds normal. No respiratory distress. He exhibits no tenderness.  Abdominal: Soft. Normal appearance and bowel sounds are normal. There is no hepatosplenomegaly. There is generalized tenderness. There is no rebound, no guarding, no tenderness at McBurney's point and negative Murphy's sign. No hernia.  Musculoskeletal: Normal range of motion.  Neurological: He is alert and oriented to person, place, and time. He has normal strength. No cranial nerve deficit or sensory deficit. Coordination normal. GCS eye subscore is 4. GCS verbal subscore is 5. GCS motor subscore is 6.  Skin: Skin is warm, dry and intact. No rash noted. No cyanosis.  Psychiatric: He has a normal mood and affect. His speech is normal and behavior is normal. Thought content normal.    ED Course  Procedures (including critical care time) Labs Review Labs Reviewed  COMPREHENSIVE METABOLIC PANEL - Abnormal; Notable for the following:    Potassium 3.4 (*)    Glucose, Bld 112 (*)    All other components within normal limits  URINALYSIS, ROUTINE W REFLEX MICROSCOPIC - Abnormal; Notable for the following:    Protein, ur 30 (*)    All other components within normal limits  CBC WITH DIFFERENTIAL  LIPASE, BLOOD  URINE MICROSCOPIC-ADD ON    Imaging Review No results found.   EKG Interpretation None      MDM   Final diagnoses:  None    Patient presents to the ER for evaluation of nausea, vomiting as well as mouth pain. He has a history of Behcet's disease with similar presentations in the past. Examination did reveal ulceration of the mucosa on the left side of the mouth causing pain. No sign of dental abscess. Abdominal exam was mildly diffusely tender but no guarding or rebound. After IV fluids and Zofran he is improved.  Minimal tenderness now. Patient's only complaint is mouth pain. He is driving so this was not addressed, but will prescribe Percocet. Additionally patient be placed on a prednisone taper, Zofran as needed for nausea and vomiting. Return if symptoms worsen.     Orpah Greek, MD 08/15/13 2214

## 2013-08-15 NOTE — Discharge Instructions (Signed)
Behet's Disease Behet's disease is a rare, chronic, lifelong disorder. It involves inflammation of blood vessels throughout the body.  CAUSES  The exact cause is unknown. It is believed that an autoimmune reaction may cause blood vessels to become inflamed. It is not clear what triggers this reaction. SYMPTOMS  Symptoms of Behet's disease include:  Returning (recurrent) genital ulcers and oral ulcers that resemble canker sores.   Eye inflammation.  The disorder may also cause:   Various types of skin sores (lesions).   Arthritis.   Bowel inflammation.   Inflammation of the membranes of the brain and spinal cord (meningitis).  This disease generally begins when patients are in their 25s or 20s. But all age groups may be affected. Behet's is a multisystem disease. It may involve all organs and affect the central nervous system. This may cause:   Memory loss.   Impaired speech, balance, and movement.  TREATMENT  There is no cure for Behet's disease. Treatment typically focuses on reducing discomfort and preventing serious complications. The effects of the disease may include blindness, stroke, swelling of the spinal cord, or intestinal complications. Corticosteroids and other medications that suppress the immune system may be given to treat inflammation.  Behet's recurs or keeps causing problems (chronic). But patients may have periods of time when symptoms go away temporarily (remission). How bad the disease gets is different from patient to patient. Some patients may live normal lives. Others may become blind or severely disabled. FOR MORE INFORMATION American Behet's Disease Association: www.behcets.com Document Released: 03/10/2002 Document Revised: 01/08/2013 Document Reviewed: 01/18/2005 [facility] Patient Information 2014 [place].

## 2013-08-20 ENCOUNTER — Ambulatory Visit: Payer: Medicare Other | Admitting: Neurology

## 2013-10-19 ENCOUNTER — Encounter (HOSPITAL_COMMUNITY): Payer: Self-pay | Admitting: Emergency Medicine

## 2013-10-19 ENCOUNTER — Emergency Department (HOSPITAL_COMMUNITY)
Admission: EM | Admit: 2013-10-19 | Discharge: 2013-10-19 | Disposition: A | Discharge status: Home / Self Care | Payer: Medicare Other | Attending: Emergency Medicine | Admitting: Emergency Medicine

## 2013-10-19 DIAGNOSIS — H538 Other visual disturbances: Secondary | ICD-10-CM | POA: Insufficient documentation

## 2013-10-19 DIAGNOSIS — Z79899 Other long term (current) drug therapy: Secondary | ICD-10-CM | POA: Insufficient documentation

## 2013-10-19 DIAGNOSIS — I10 Essential (primary) hypertension: Secondary | ICD-10-CM | POA: Diagnosis not present

## 2013-10-19 DIAGNOSIS — Z862 Personal history of diseases of the blood and blood-forming organs and certain disorders involving the immune mechanism: Secondary | ICD-10-CM | POA: Diagnosis not present

## 2013-10-19 DIAGNOSIS — G40909 Epilepsy, unspecified, not intractable, without status epilepticus: Secondary | ICD-10-CM | POA: Insufficient documentation

## 2013-10-19 DIAGNOSIS — F319 Bipolar disorder, unspecified: Secondary | ICD-10-CM | POA: Insufficient documentation

## 2013-10-19 DIAGNOSIS — M352 Behcet's disease: Secondary | ICD-10-CM | POA: Diagnosis not present

## 2013-10-19 DIAGNOSIS — Z8639 Personal history of other endocrine, nutritional and metabolic disease: Secondary | ICD-10-CM | POA: Insufficient documentation

## 2013-10-19 DIAGNOSIS — G43909 Migraine, unspecified, not intractable, without status migrainosus: Secondary | ICD-10-CM | POA: Insufficient documentation

## 2013-10-19 LAB — COMPREHENSIVE METABOLIC PANEL
ALT: 21 U/L (ref 0–53)
ANION GAP: 14 (ref 5–15)
AST: 20 U/L (ref 0–37)
Albumin: 4 g/dL (ref 3.5–5.2)
Alkaline Phosphatase: 98 U/L (ref 39–117)
BILIRUBIN TOTAL: 0.3 mg/dL (ref 0.3–1.2)
BUN: 14 mg/dL (ref 6–23)
CALCIUM: 9.3 mg/dL (ref 8.4–10.5)
CO2: 23 meq/L (ref 19–32)
CREATININE: 0.93 mg/dL (ref 0.50–1.35)
Chloride: 101 mEq/L (ref 96–112)
GLUCOSE: 93 mg/dL (ref 70–99)
Potassium: 4.2 mEq/L (ref 3.7–5.3)
Sodium: 138 mEq/L (ref 137–147)
Total Protein: 7.7 g/dL (ref 6.0–8.3)

## 2013-10-19 LAB — CBC WITH DIFFERENTIAL/PLATELET
BASOS ABS: 0 10*3/uL (ref 0.0–0.1)
Basophils Relative: 0 % (ref 0–1)
EOS PCT: 4 % (ref 0–5)
Eosinophils Absolute: 0.3 10*3/uL (ref 0.0–0.7)
HEMATOCRIT: 44.1 % (ref 39.0–52.0)
HEMOGLOBIN: 14.7 g/dL (ref 13.0–17.0)
LYMPHS PCT: 24 % (ref 12–46)
Lymphs Abs: 1.7 10*3/uL (ref 0.7–4.0)
MCH: 30.8 pg (ref 26.0–34.0)
MCHC: 33.3 g/dL (ref 30.0–36.0)
MCV: 92.3 fL (ref 78.0–100.0)
MONO ABS: 0.6 10*3/uL (ref 0.1–1.0)
MONOS PCT: 8 % (ref 3–12)
Neutro Abs: 4.4 10*3/uL (ref 1.7–7.7)
Neutrophils Relative %: 64 % (ref 43–77)
Platelets: 205 10*3/uL (ref 150–400)
RBC: 4.78 MIL/uL (ref 4.22–5.81)
RDW: 11.8 % (ref 11.5–15.5)
WBC: 7 10*3/uL (ref 4.0–10.5)

## 2013-10-19 MED ORDER — HYDROMORPHONE HCL PF 1 MG/ML IJ SOLN
1.0000 mg | Freq: Once | INTRAMUSCULAR | Status: AC
  Administered 2013-10-19: 1 mg via INTRAVENOUS
  Filled 2013-10-19: qty 1

## 2013-10-19 MED ORDER — PREDNISONE 50 MG PO TABS: ORAL_TABLET | ORAL | Status: DC

## 2013-10-19 MED ORDER — OXYCODONE-ACETAMINOPHEN 5-325 MG PO TABS: 1.0000 | ORAL_TABLET | Freq: Four times a day (QID) | ORAL | Status: DC | PRN

## 2013-10-19 MED ORDER — OXYCODONE-ACETAMINOPHEN 5-325 MG PO TABS
1.0000 | ORAL_TABLET | Freq: Once | ORAL | Status: AC
  Administered 2013-10-19: 1 via ORAL
  Filled 2013-10-19: qty 1

## 2013-10-19 MED ORDER — TETRACAINE HCL 0.5 % OP SOLN
1.0000 [drp] | Freq: Once | OPHTHALMIC | Status: AC
  Administered 2013-10-19: 1 [drp] via OPHTHALMIC
  Filled 2013-10-19: qty 2

## 2013-10-19 MED ORDER — FLUORESCEIN SODIUM 1 MG OP STRP
1.0000 | ORAL_STRIP | Freq: Once | OPHTHALMIC | Status: AC
  Administered 2013-10-19: 1 via OPHTHALMIC
  Filled 2013-10-19: qty 1

## 2013-10-19 MED ORDER — PREDNISONE 20 MG PO TABS
60.0000 mg | ORAL_TABLET | Freq: Once | ORAL | Status: AC
  Administered 2013-10-19: 60 mg via ORAL
  Filled 2013-10-19: qty 3

## 2013-10-19 NOTE — ED Notes (Signed)
MD at bedside. 

## 2013-10-19 NOTE — Discharge Instructions (Signed)
Behcet Syndrome Behcet syndrome is a rare disorder that involves inflammation of blood vessels (vasculitis) throughout your body. This condition usually begins between the ages of 20 years and 40 years. Behcet syndrome can range from mild to severe and is a condition you will have for the rest of your life (chronic). There is no cure, but symptoms may go away on their own for periods of time. It can cause various symptoms, including open sores (ulcers) in your mouth or on your genitals. It can affect many organs and systems in your body, including your heart, lungs, digestive system, and nervous systems. It can sometimes lead to blindness. Behcet syndrome is not spread from person to person (contagious). CAUSES  The exact cause is unknown. The condition tends to run in families. Some genes that increase risk for Behcet syndrome have been identified. If you inherit these genes, it may increase your risk.  RISK FACTORS  Being of Asian, Middle Eastern, or Turkish descent.  Being 20-40 years of age. SYMPTOMS  Signs and symptoms vary depending on the areas of the body that are affected. Early and common signs and symptoms include:   Open sores on your:  Mouth. These may look like canker sores. The sores may come and go.  Genitals. These may come and go and leave scars when they heal.  Skin. These may appear as painful red bumps or pimples.  Eye problems including:  Redness.  Blurred vision.  Tearing.  Pain.  Inflammation (uveitis).  Arthritis.   Swelling of the brain and spinal cord (meningoencephalitis). Less common signs and symptoms may develop over time and can include:   Abdominal pain and bleeding in your digestive system.  Memory loss.  Behavior changes.  Loss of interest in things you enjoy (apathy).  Seizures.  Blood clots.  Weakening of blood vessels (aneurysms).  Chest pain.  Trouble breathing.  Impaired speech, balance, and movement. DIAGNOSIS  Behcet  syndrome is hard to diagnose because there will be times when you are symptom free. Your health care provider may diagnose the condition based on your medical history and a physical exam. The main factors that help confirm the diagnosis are presence of mouth sores at least three times in 1 year and any two of the following:  Genital sores that keep coming back.  Eye inflammation with loss of vision.  Skin sores that are characteristic of Behcet syndrome.  A positive skin prick test. If you have the condition, a skin prick may produce a red bump in 1-2 days. Yourhealth care provider may perform additional tests, including:   CT or MRI scans of your joints, brain, or bones.  Blood vessel studies (angiogram).  Removing pieces of affected body tissue (biopsy) to check for vasculitis. TREATMENT  There is no cure for Behcet syndrome. Treatment typically focuses on relieving your discomfort and preventing serious complications. You may need to work with a team of health care providers because many different parts of your body may be involved. Common treatments include:  Strong anti-inflammatory medicines (corticosteroids).  Medicines that suppress your immune system and treat inflammation.  Steroid creams to treat oral and genital ulcers.  Other medicines your health care team may recommend based on your symptoms and the parts of your body involved. HOME CARE INSTRUCTIONS Follow all your health care provider's instructions. Theinstructions you get will depend on your specific symptoms and treatments. General instructions may include:  Get plenty of rest, especially when your symptoms worsen.  Get moderate exercise (  walking and swimming) when not experiencing worsening of symptoms.  Include lots of vegetables, fruits, and whole grains in your diet.  Avoid high-fat foods.  Do not smoke.  Keep all follow-up appointments. SEEK MEDICAL CARE IF:  Your symptoms worsen and are not  managed by medicines and home care. SEEK IMMEDIATE MEDICAL CARE IF:  You suddenly lose your vision.  You vomit blood or have blood in your stool.  You have very bad abdominal pain.  You suddenly have a very bad headache.  You have a seizure.  You have a red, warm, or tender swelling in your leg.  You have chest pain or trouble breathing. FOR MORE INFORMATION American Behcet's Disease Association: www.behcets.com Document Released: 03/10/2002 Document Revised: 03/25/2013 Document Reviewed: 02/18/2013 [employer] Patient Information 2015 [employer]. This information is not intended to replace advice given to you by your health care provider. Make sure you discuss any questions you have with your health care provider.  

## 2013-10-19 NOTE — ED Notes (Addendum)
Patient with lesions on mouth, chest, and organs from a chronic immune system disease.  Patient reports left eye vision blurring.  Several superficial open wounds on left arm which are visible.  Patient reports he vomited some blood this morning.  Patient reports he is in pain and takes methotrexate for this disorder.

## 2013-10-19 NOTE — ED Notes (Addendum)
Pt c/o Behcet's disease "flare up".  Sts lesions on L forearm, mouth lesions, and blurred vision in L eye x 4 days and hematemesis x 1 episode this morning.  Pain score 6/10.  Pt thinks that he may have lesions on his esophagus.  Pt reports taking all medications as prescribed w/o relief.

## 2013-10-19 NOTE — ED Provider Notes (Signed)
CSN: [id number]     Arrival date & time 10/19/13  1222 History   First MD Initiated Contact with Patient 10/19/13 1333     Chief Complaint  Patient presents with  . Mouth Lesions  . Blurred Vision  . Hematemesis     (Consider location/radiation/quality/duration/timing/severity/associated sxs/prior Treatment) Patient is a 33 y.o. male presenting with mouth sores. The history is provided by the patient.  Mouth Lesions Location:  Tongue and buccal mucosa Buccal mucosa location:  L buccal mucosa Quality:  Ulcerous Onset quality:  Gradual Severity:  Mild Duration:  3 days Progression:  Waxing and waning Chronicity:  Chronic Context comment:  Behcets dis Relieved by:  Nothing Worsened by:  Nothing tried Ineffective treatments:  None tried Associated symptoms: rash   Associated symptoms: no fever, no neck pain and no rhinorrhea     Past Medical History  Diagnosis Date  . Seizures     last experienced a seizure x 3 years ago  . Migraine   . Hypertension   . Bipolar disorder   . Vitamin D deficiency   . Behcet's disease    Past Surgical History  Procedure Laterality Date  . Appendectomy    . Maxilofacial    . Fracture surgery Right     Arm   Family History  Problem Relation Age of Onset  . Lung cancer Paternal Grandfather    History  Substance Use Topics  . Smoking status: Never Smoker   . Smokeless tobacco: Not on file  . Alcohol Use: No    Review of Systems  Constitutional: Negative for fever.  HENT: Positive for mouth sores. Negative for drooling and rhinorrhea.   Eyes: Positive for pain and visual disturbance.  Respiratory: Negative for cough and shortness of breath.   Cardiovascular: Negative for chest pain and leg swelling.  Gastrointestinal: Negative for nausea, vomiting, abdominal pain and diarrhea.  Genitourinary: Negative for dysuria and hematuria.  Musculoskeletal: Negative for gait problem and neck pain.  Skin: Positive for rash. Negative for  color change.  Neurological: Negative for numbness and headaches.  Hematological: Negative for adenopathy.  Psychiatric/Behavioral: Negative for behavioral problems.  All other systems reviewed and are negative.     Allergies  Review of patient's allergies indicates no known allergies.  Home Medications   Prior to Admission medications   Medication Sig Start Date End Date Taking? Authorizing Provider  atenolol (TENORMIN) 50 MG tablet Take 1 tablet (50 mg total) by mouth daily. 02/16/13  Yes Robbie Lis, MD  clonazePAM (KLONOPIN) 0.5 MG tablet Take 1 tablet (0.5 mg total) by mouth 3 (three) times daily. For anxiety 02/16/13  Yes Robbie Lis, MD  Eszopiclone 3 MG TABS Take 3 mg by mouth at bedtime.  07/24/13  Yes Historical Provider, MD  folic acid (FOLVITE) 1 MG tablet Take 1 mg by mouth daily.  04/07/13  Yes Historical Provider, MD  hydrOXYzine (VISTARIL) 50 MG capsule Take 50 mg by mouth at bedtime.  05/03/13  Yes Historical Provider, MD  lamoTRIgine (LAMICTAL) 200 MG tablet Take 200 mg by mouth 2 (two) times daily.   Yes Historical Provider, MD  lisdexamfetamine (VYVANSE) 60 MG capsule Take 1 capsule (60 mg total) by mouth daily at 12 noon. 02/16/13  Yes Robbie Lis, MD  methotrexate (RHEUMATREX) 2.5 MG tablet Take 17.5 mg by mouth once a week. Takes on Tuesday nights. 05/03/13  Yes Historical Provider, MD  QUEtiapine (SEROQUEL XR) 300 MG 24 hr tablet Take 1 tablet (300  mg total) by mouth at bedtime. 02/16/13  Yes Robbie Lis, MD  venlafaxine XR (EFFEXOR-XR) 150 MG 24 hr capsule Take 150 mg by mouth daily with breakfast.  05/03/13  Yes Historical Provider, MD  Vitamin D, Ergocalciferol, (DRISDOL) 50000 UNITS CAPS capsule Take 50,000 Units by mouth every 7 (seven) days.   Yes Historical Provider, MD   BP 145/92  Pulse 86  Temp(Src) 97.7 F (36.5 C) (Oral)  Resp 20  SpO2 100% Physical Exam  Nursing note and vitals reviewed. Constitutional: He is oriented to person, place, and  time. He appears well-developed and well-nourished.  HENT:  Head: Normocephalic and atraumatic.  Right Ear: External ear normal.  Left Ear: External ear normal.  Nose: Nose normal.  Mouth/Throat: Oropharynx is clear and moist. No oropharyngeal exudate.  Several small shallow ulcers noted on the tip of the tongue and on the buccal mucosa in the mouth.  Eyes: Conjunctivae and EOM are normal. Pupils are equal, round, and reactive to light.  Neck: Normal range of motion. Neck supple.  Cardiovascular: Normal rate, regular rhythm, normal heart sounds and intact distal pulses.  Exam reveals no gallop and no friction rub.   No murmur heard. Pulmonary/Chest: Effort normal and breath sounds normal. No respiratory distress. He has no wheezes.  Abdominal: Soft. Bowel sounds are normal. He exhibits no distension. There is no tenderness. There is no rebound and no guarding.  Musculoskeletal: Normal range of motion. He exhibits no edema and no tenderness.  2 shallow ulcerated lesions to the dorsal aspect of the left forearm as pictured.  Neurological: He is alert and oriented to person, place, and time.  Skin: Skin is warm and dry.  Psychiatric: He has a normal mood and affect. His behavior is normal.    ED Course  Procedures (including critical care time) Labs Review Labs Reviewed  CBC WITH DIFFERENTIAL  COMPREHENSIVE METABOLIC PANEL    Imaging Review No results found.   EKG Interpretation None        MDM   Final diagnoses:  Blurry vision, left eye  Behcet's disease    1:52 PM 33 y.o. male w a hx of Behcet's dis on methotrexate who presents with blurry vision and pain in his left eye which began to 3 days ago. He also has some mild lesions on his left forearm and in his mouth. He notes that he had an episode of emesis this morning which had a mild to moderate amount of blood in it. He states that he has had hematemesis in the past and recently had an endoscopy which showed lesions in  his esophagus which were healing. He is afebrile and vital signs are unremarkable here. Will have nurse perform visual acuity, get pain control, get screening labs, and stain his eyes.  IOP 17 in left eye, 14 in right.   Vision 20/25 in Right eye. 20/200 in Left eye.   I reviewed the records from [facility]. He was seen in Dec 2014 at the opthalmologist for similar issues. He was not started on any new meds by optho. I spoke w/ the optho on call at [facility], she had no specific rec's. She did place an appointment with the scheduler so the patient had close followup early next week for an eye exam. Will go ahead and start the patient on oral prednisone to help with the lesions as well.  3:34 PM:  I have discussed the diagnosis/risks/treatment options with the patient and believe the pt to be eligible  for discharge home to follow-up with optho early next week. We also discussed returning to the ED immediately if new or worsening sx occur. We discussed the sx which are most concerning (e.g., worsening vision, worsening pain, fever) that necessitate immediate return. Medications administered to the patient during their visit and any new prescriptions provided to the patient are listed below.  Medications given during this visit Medications  HYDROmorphone (DILAUDID) injection 1 mg (1 mg Intravenous Given 10/19/13 1410)  fluorescein ophthalmic strip 1 strip (1 strip Both Eyes Given by Other 10/19/13 1509)  tetracaine (PONTOCAINE) 0.5 % ophthalmic solution 1 drop (1 drop Left Eye Given by Other 10/19/13 1509)  oxyCODONE-acetaminophen (PERCOCET/ROXICET) 5-325 MG per tablet 1 tablet (1 tablet Oral Given 10/19/13 1524)  HYDROmorphone (DILAUDID) injection 1 mg (1 mg Intravenous Given 10/19/13 1524)    New Prescriptions   OXYCODONE-ACETAMINOPHEN (PERCOCET) 5-325 MG PER TABLET    Take 1 tablet by mouth every 6 (six) hours as needed for moderate pain.   PREDNISONE (DELTASONE) 50 MG TABLET    Take 1 tablet daily for  the next 4 days.     Blanchard Kelch, MD 10/19/13 [phone]

## 2013-11-06 ENCOUNTER — Emergency Department (HOSPITAL_COMMUNITY)
Admission: EM | Admit: 2013-11-06 | Discharge: 2013-11-07 | Disposition: A | Discharge status: Home / Self Care | Payer: Medicare Other | Attending: Emergency Medicine | Admitting: Emergency Medicine

## 2013-11-06 ENCOUNTER — Encounter (HOSPITAL_COMMUNITY): Payer: Self-pay | Admitting: Emergency Medicine

## 2013-11-06 DIAGNOSIS — G4459 Other complicated headache syndrome: Secondary | ICD-10-CM | POA: Diagnosis not present

## 2013-11-06 DIAGNOSIS — Z79899 Other long term (current) drug therapy: Secondary | ICD-10-CM | POA: Insufficient documentation

## 2013-11-06 DIAGNOSIS — E559 Vitamin D deficiency, unspecified: Secondary | ICD-10-CM | POA: Insufficient documentation

## 2013-11-06 DIAGNOSIS — G40909 Epilepsy, unspecified, not intractable, without status epilepticus: Secondary | ICD-10-CM | POA: Diagnosis not present

## 2013-11-06 DIAGNOSIS — G43909 Migraine, unspecified, not intractable, without status migrainosus: Secondary | ICD-10-CM | POA: Diagnosis not present

## 2013-11-06 DIAGNOSIS — Z8619 Personal history of other infectious and parasitic diseases: Secondary | ICD-10-CM | POA: Insufficient documentation

## 2013-11-06 DIAGNOSIS — H53129 Transient visual loss, unspecified eye: Secondary | ICD-10-CM | POA: Diagnosis not present

## 2013-11-06 DIAGNOSIS — H547 Unspecified visual loss: Secondary | ICD-10-CM | POA: Insufficient documentation

## 2013-11-06 DIAGNOSIS — F319 Bipolar disorder, unspecified: Secondary | ICD-10-CM | POA: Diagnosis not present

## 2013-11-06 DIAGNOSIS — H53122 Transient visual loss, left eye: Secondary | ICD-10-CM

## 2013-11-06 MED ORDER — SODIUM CHLORIDE 0.9 % IV BOLUS (SEPSIS)
1000.0000 mL | Freq: Once | INTRAVENOUS | Status: AC
  Administered 2013-11-06: 1000 mL via INTRAVENOUS

## 2013-11-06 MED ORDER — DIPHENHYDRAMINE HCL 50 MG/ML IJ SOLN
25.0000 mg | Freq: Once | INTRAMUSCULAR | Status: AC
  Administered 2013-11-06: 25 mg via INTRAVENOUS
  Filled 2013-11-06: qty 1

## 2013-11-06 MED ORDER — METOCLOPRAMIDE HCL 5 MG/ML IJ SOLN
10.0000 mg | Freq: Once | INTRAMUSCULAR | Status: AC
  Administered 2013-11-06: 10 mg via INTRAVENOUS
  Filled 2013-11-06: qty 2

## 2013-11-06 NOTE — ED Notes (Signed)
Pt reports sudden onset of vision loss in his L eye today.  Pt reports dizziness and head pain.  Pt is crying, reports feeling scared.

## 2013-11-07 DIAGNOSIS — H53129 Transient visual loss, unspecified eye: Secondary | ICD-10-CM | POA: Diagnosis not present

## 2013-11-07 NOTE — ED Provider Notes (Signed)
CSN: [id number]     Arrival date & time 11/06/13  2211 History   First MD Initiated Contact with Patient 11/06/13 2305     Chief Complaint  Patient presents with  . Loss of Vision     (Consider location/radiation/quality/duration/timing/severity/associated sxs/prior Treatment) HPI Patient reports about 10 PM tonight he was driving his vehicle. He states he didn't feel good specifically he states he felt like his equilibrium was off while sitting driving. He states sometimes he feels that way before he has a seizure. He states he blinked and he felt like a shade come down over the vision in his left eye and his vision went black. He states he has had blurred vision before with his Behcet's. He also reports he gets swelling of the eyelids around his eyes. He denies any pain in his eyeball. He states he does have some pain around his left eye. He states he feels like he is falling to the right. He describes his headache as sharp and not throbbing. He states it feels hot. He denies nausea, vomiting, numbness or tingling to his extremities. He states right before he had the shade come down he saw some floaters which he's had in the past with headaches.  Patient states he was diagnosed with Behcet's about 2 years ago. He has skin, joint, and eye involvement. He was on oral methotrexate for about a year. He was recently started on methotrexate injections and he gave himself his second injection this morning about 9 AM. He states he has lesions on his retina. He is being referred to a retinal specialist at [facility] on August 13.  PCP Dr Edwin Dada Ophthalmologist Dr Hassell Done at [facility], has an appt with retinal specialist on 8/13  Past Medical History  Diagnosis Date  . Seizures     last experienced a seizure x 3 years ago  . Migraine   . Hypertension   . Bipolar disorder   . Vitamin D deficiency   . Behcet's disease    Past Surgical History  Procedure Laterality Date  . Appendectomy    .  Maxilofacial    . Fracture surgery Right     Arm   Family History  Problem Relation Age of Onset  . Lung cancer Paternal Grandfather    History  Substance Use Topics  . Smoking status: Never Smoker   . Smokeless tobacco: Not on file  . Alcohol Use: No  on disability for seizures  Review of Systems  All other systems reviewed and are negative.     Allergies  Review of patient's allergies indicates no known allergies.  Home Medications   Prior to Admission medications   Medication Sig Start Date End Date Taking? Authorizing Provider  atenolol (TENORMIN) 50 MG tablet Take 1 tablet (50 mg total) by mouth daily. 02/16/13  Yes Robbie Lis, MD  clonazePAM (KLONOPIN) 0.5 MG tablet Take 1 tablet (0.5 mg total) by mouth 3 (three) times daily. For anxiety 02/16/13  Yes Robbie Lis, MD  Eszopiclone 3 MG TABS Take 3 mg by mouth at bedtime.  07/24/13  Yes Historical Provider, MD  folic acid (FOLVITE) 1 MG tablet Take 1 mg by mouth daily.  04/07/13  Yes Historical Provider, MD  hydrOXYzine (VISTARIL) 50 MG capsule Take 50 mg by mouth at bedtime.  05/03/13  Yes Historical Provider, MD  lamoTRIgine (LAMICTAL) 200 MG tablet Take 200 mg by mouth 2 (two) times daily.   Yes Historical Provider, MD  lisdexamfetamine (VYVANSE)  60 MG capsule Take 1 capsule (60 mg total) by mouth daily at 12 noon. 02/16/13  Yes Robbie Lis, MD  Methotrexate, PF, 30 MG/0.6ML SOAJ Inject 1 Syringe into the skin every 7 (seven) days. Thursdays   Yes Historical Provider, MD  QUEtiapine (SEROQUEL XR) 400 MG 24 hr tablet Take 400 mg by mouth at bedtime.   Yes Historical Provider, MD  venlafaxine XR (EFFEXOR-XR) 150 MG 24 hr capsule Take 150 mg by mouth daily with breakfast.  05/03/13  Yes Historical Provider, MD  Vitamin D, Ergocalciferol, (DRISDOL) 50000 UNITS CAPS capsule Take 50,000 Units by mouth every 7 (seven) days.   Yes Historical Provider, MD   BP 107/65  Pulse 96  Temp(Src) 97.6 F (36.4 C) (Oral)  Resp 20   SpO2 98% Vital signs normal   Physical Exam  Nursing note and vitals reviewed. Constitutional: He is oriented to person, place, and time. He appears well-developed and well-nourished.  Non-toxic appearance. He does not appear ill. No distress.  HENT:  Head: Normocephalic and atraumatic.  Right Ear: External ear normal.  Left Ear: External ear normal.  Nose: Nose normal. No mucosal edema or rhinorrhea.  Mouth/Throat: Oropharynx is clear and moist and mucous membranes are normal. No dental abscesses or uvula swelling.  Eyes: Conjunctivae and EOM are normal. Pupils are equal, round, and reactive to light.  When I cover the patients right eye he states he sees nothing, everything is dark.  When I shine the light in his left eye, his right pupil constricts and vice versa. fundoscopic exam Disc is sharp, vessels are normal, no bleeding seen in the vitreous humor   Neck: Normal range of motion and full passive range of motion without pain. Neck supple.  Cardiovascular: Normal rate, regular rhythm and normal heart sounds.  Exam reveals no gallop and no friction rub.   No murmur heard. Pulmonary/Chest: Effort normal and breath sounds normal. No respiratory distress. He has no wheezes. He has no rhonchi. He has no rales. He exhibits no tenderness and no crepitus.  Abdominal: Soft. Normal appearance and bowel sounds are normal. He exhibits no distension. There is no tenderness. There is no rebound and no guarding.  Musculoskeletal: Normal range of motion. He exhibits no edema and no tenderness.  Moves all extremities well.   Neurological: He is alert and oriented to person, place, and time. He has normal strength. No cranial nerve deficit.  Skin: Skin is warm, dry and intact. No rash noted. No erythema. No pallor.  Psychiatric: He has a normal mood and affect. His speech is normal and behavior is normal. His mood appears not anxious.    ED Course  Procedures (including critical care  time)  Medications  sodium chloride 0.9 % bolus 1,000 mL (0 mLs Intravenous Stopped 11/07/13 0212)  metoCLOPramide (REGLAN) injection 10 mg (10 mg Intravenous Given 11/06/13 2350)  diphenhydrAMINE (BENADRYL) injection 25 mg (25 mg Intravenous Given 11/06/13 2348)   22:38 Dr Anderson Malta states he should call his ophthalmologist at [facility] at 8 am in the morning to have them recheck his eye. States they would not operate emergently during the night.   02:00 pt states his headache is gone. He can now count fingers with his left eye but states his vision is still blurred.   Visual Acuity - R Distance: (20/40) ; L Distance: (20/10)  Labs Review Labs Reviewed - No data to display  Imaging Review No results found.   EKG Interpretation None  MDM   Final diagnoses:  Headache syndrome, complicated  Visual loss, transient, left    Plan discharge  Rolland Porter, MD, Alanson Aly, MD 11/07/13 [phone]

## 2013-11-07 NOTE — Discharge Instructions (Signed)
Call your ophthalmologist at [facility] at 8 am to arrange to have them recheck your left eye today. If your vision gets worse, go to [facility] ED. Your vision loss may be related to the headache you had tonight, but with the lesions in your eye from the Behcet's you should have your ophthalmologist recheck you today.

## 2013-11-17 ENCOUNTER — Other Ambulatory Visit: Payer: Self-pay | Admitting: Neurology

## 2013-12-09 ENCOUNTER — Telehealth: Payer: Self-pay | Admitting: Neurology

## 2013-12-09 MED ORDER — LAMOTRIGINE 200 MG PO TABS: 200.0000 mg | ORAL_TABLET | Freq: Two times a day (BID) | ORAL | Status: DC

## 2013-12-09 NOTE — Telephone Encounter (Signed)
Rx has been sent to last until appt.  I called the patent back.  He is aware.

## 2013-12-09 NOTE — Telephone Encounter (Signed)
Patient requesting refill of Lamictal. Pharmacy told him he needs to make an appointment before next refill. He has one scheduled for Oct. 12 but will run out of meds before the apt. He uses [facility] [address]. Best number to call the patient is [phone]

## 2014-01-12 ENCOUNTER — Encounter: Payer: Self-pay | Admitting: Neurology

## 2014-01-12 ENCOUNTER — Ambulatory Visit (INDEPENDENT_AMBULATORY_CARE_PROVIDER_SITE_OTHER): Payer: Medicare Other | Admitting: Neurology

## 2014-01-12 VITALS — BP 125/90 | HR 86 | Ht 72.0 in | Wt 229.0 lb

## 2014-01-12 DIAGNOSIS — R569 Unspecified convulsions: Secondary | ICD-10-CM

## 2014-01-12 DIAGNOSIS — Z5181 Encounter for therapeutic drug level monitoring: Secondary | ICD-10-CM

## 2014-01-12 DIAGNOSIS — M352 Behcet's disease: Secondary | ICD-10-CM

## 2014-01-12 MED ORDER — LAMOTRIGINE 200 MG PO TABS: 200.0000 mg | ORAL_TABLET | Freq: Two times a day (BID) | ORAL | Status: DC

## 2014-01-12 NOTE — Patient Instructions (Signed)

## 2014-01-12 NOTE — Progress Notes (Signed)
Reason for visit: Seizures  [name] [name] is an 33 y.o. male  History of present illness:  [name] [name] is a 33 year old right-handed white male with a history of Behcet's disease and epilepsy. The patient has done quite well on Lamictal taking 200 mg twice daily. He indicates that he has not had any seizures since last seen one year ago, and he is tolerating the medication well. The patient indicates that he has had ongoing issues with his Behcet's disease, with skin lesions and infections, lesions in the esophagus, and some visual disturbances. The patient has followed up with Dr.Tim Hassell Done through neuro-ophthalmology at [facility], and he gets his rheumatologic care through [facility] as well. The patient returns for an evaluation.   Past Medical History  Diagnosis Date  . Seizures     last experienced a seizure x 3 years ago  . Migraine   . Hypertension   . Bipolar disorder   . Vitamin D deficiency   . Behcet's disease     Past Surgical History  Procedure Laterality Date  . Appendectomy    . Maxilofacial    . Fracture surgery Right     Arm    Family History  Problem Relation Age of Onset  . Lung cancer Paternal Grandfather     Social history:  reports that he has never smoked. He does not have any smokeless tobacco history on file. He reports that he does not drink alcohol or use illicit drugs.   No Known Allergies  Medications:  Current Outpatient Prescriptions on File Prior to Visit  Medication Sig Dispense Refill  . atenolol (TENORMIN) 50 MG tablet Take 1 tablet (50 mg total) by mouth daily.  30 tablet  0  . clonazePAM (KLONOPIN) 0.5 MG tablet Take 1 tablet (0.5 mg total) by mouth 3 (three) times daily. For anxiety  90 tablet  0  . Eszopiclone 3 MG TABS Take 3 mg by mouth at bedtime.       . folic acid (FOLVITE) 1 MG tablet Take 1 mg by mouth daily.       . hydrOXYzine (VISTARIL) 50 MG capsule Take 50 mg by mouth at bedtime.       Marland Kitchen lisdexamfetamine (VYVANSE) 60 MG capsule  Take 1 capsule (60 mg total) by mouth daily at 12 noon.  30 capsule  0  . Methotrexate, PF, 30 MG/0.6ML SOAJ Inject 1 Syringe into the skin every 7 (seven) days. Thursdays      . QUEtiapine (SEROQUEL XR) 400 MG 24 hr tablet Take 400 mg by mouth at bedtime.      Marland Kitchen venlafaxine XR (EFFEXOR-XR) 150 MG 24 hr capsule Take 150 mg by mouth daily with breakfast.       . Vitamin D, Ergocalciferol, (DRISDOL) 50000 UNITS CAPS capsule Take 50,000 Units by mouth every 7 (seven) days.       No current facility-administered medications on file prior to visit.    ROS:  Out of a complete 14 system review of symptoms, the patient complains only of the following symptoms, and all other reviewed systems are negative.  Joint pain, joint swelling Depression  Blood pressure 125/90, pulse 86, height 6' (1.829 m), weight 229 lb (103.874 kg).  Physical Exam  General: The patient is alert and cooperative at the time of the examination. The patient is minimally obese.   Skin: No significant peripheral edema is noted.   Neurologic Exam  Mental status: The patient is oriented x 3.  Cranial  nerves: Facial symmetry is present. Speech is normal, no aphasia or dysarthria is noted. Extraocular movements are full. Visual fields are full.  Motor: The patient has good strength in all 4 extremities.  Sensory examination: Soft touch sensation is symmetric on the face, arms, and legs.   Coordination: The patient has good finger-nose-finger and heel-to-shin bilaterally.  Gait and station: The patient has a normal gait. Tandem gait is normal. Romberg is negative. No drift is seen.  Reflexes: Deep tendon reflexes are symmetric.   Assessment/Plan:  1. Bechet's disease  2. History of seizures  The patient is doing quite well with his seizures at this point, and he continues to operate a motor vehicle. The patient has had blood work done recently that includes a CBC and a liver profile done at [facility].we will check a  lamotrigine level today. He will followup in one year.   Jill Alexanders MD 01/12/2014 8:06 PM  [facility] [address] [place], [place] [address]  Phone [phone] Fax [phone]

## 2014-01-13 LAB — LAMOTRIGINE LEVEL: LAMOTRIGINE LVL: 9 ug/mL (ref 2.0–20.0)

## 2014-02-06 ENCOUNTER — Encounter (HOSPITAL_COMMUNITY): Payer: Self-pay | Admitting: Emergency Medicine

## 2014-02-06 ENCOUNTER — Emergency Department (HOSPITAL_COMMUNITY)
Admission: EM | Admit: 2014-02-06 | Discharge: 2014-02-06 | Disposition: A | Discharge status: Home / Self Care | Payer: Medicare Other | Attending: Emergency Medicine | Admitting: Emergency Medicine

## 2014-02-06 DIAGNOSIS — I10 Essential (primary) hypertension: Secondary | ICD-10-CM | POA: Diagnosis not present

## 2014-02-06 DIAGNOSIS — M79661 Pain in right lower leg: Secondary | ICD-10-CM | POA: Diagnosis present

## 2014-02-06 DIAGNOSIS — Z79899 Other long term (current) drug therapy: Secondary | ICD-10-CM | POA: Diagnosis not present

## 2014-02-06 DIAGNOSIS — G40909 Epilepsy, unspecified, not intractable, without status epilepticus: Secondary | ICD-10-CM | POA: Insufficient documentation

## 2014-02-06 DIAGNOSIS — G43909 Migraine, unspecified, not intractable, without status migrainosus: Secondary | ICD-10-CM | POA: Diagnosis not present

## 2014-02-06 DIAGNOSIS — F319 Bipolar disorder, unspecified: Secondary | ICD-10-CM | POA: Diagnosis not present

## 2014-02-06 DIAGNOSIS — Z8739 Personal history of other diseases of the musculoskeletal system and connective tissue: Secondary | ICD-10-CM | POA: Insufficient documentation

## 2014-02-06 DIAGNOSIS — E559 Vitamin D deficiency, unspecified: Secondary | ICD-10-CM | POA: Insufficient documentation

## 2014-02-06 MED ORDER — ENOXAPARIN SODIUM 100 MG/ML ~~LOC~~ SOLN
100.0000 mg | Freq: Once | SUBCUTANEOUS | Status: AC
  Administered 2014-02-06: 100 mg via SUBCUTANEOUS
  Filled 2014-02-06: qty 1

## 2014-02-06 MED ORDER — HYDROCODONE-ACETAMINOPHEN 5-325 MG PO TABS: 1.0000 | ORAL_TABLET | Freq: Four times a day (QID) | ORAL | Status: DC | PRN

## 2014-02-06 NOTE — Discharge Instructions (Signed)
Go to [facility] hospital tomorrow around 8:30 AM to get vascular ultrasound of your right leg.   Take motrin for pain. Take vicodin for severe pain.  Return to ER if you have severe leg pain, calf swelling, chest pain.

## 2014-02-06 NOTE — ED Notes (Signed)
Pt presents with R L E pain onset this am, sharp in nature, worse with dorsal flexion. Pt denies trauma, sent from [place] walk in

## 2014-02-06 NOTE — ED Notes (Signed)
MD at bedside. 

## 2014-02-06 NOTE — ED Provider Notes (Signed)
CSN: [id number]     Arrival date & time 02/06/14  1857 History   First MD Initiated Contact with Patient 02/06/14 2023     Chief Complaint  Patient presents with  . Leg Pain     (Consider location/radiation/quality/duration/timing/severity/associated sxs/prior Treatment) The history is provided by the patient.  [name] [name] is a 33 y.o. male hx of seizure, HTN here with right calf pain. Woke up today has progressively worse calf pain. Went to urgent care and was sent here for rule out DVT. Denies any trauma or injury. Denies any chest pain or shortness of breath. He states that it feels like a sharp shooting pain in the back of his radiate down to his right foot. No history DVT or PE or recent travels. On Methotrexate injection for Behcet's disease.    Past Medical History  Diagnosis Date  . Seizures     last experienced a seizure x 3 years ago  . Migraine   . Hypertension   . Bipolar disorder   . Vitamin D deficiency   . Behcet's disease    Past Surgical History  Procedure Laterality Date  . Appendectomy    . Maxilofacial    . Fracture surgery Right     Arm   Family History  Problem Relation Age of Onset  . Lung cancer Paternal Grandfather    History  Substance Use Topics  . Smoking status: Never Smoker   . Smokeless tobacco: Not on file  . Alcohol Use: No    Review of Systems  Musculoskeletal:       R calf pain   All other systems reviewed and are negative.     Allergies  Review of patient's allergies indicates no known allergies.  Home Medications   Prior to Admission medications   Medication Sig Start Date End Date Taking? Authorizing Provider  atenolol (TENORMIN) 50 MG tablet Take 1 tablet (50 mg total) by mouth daily. 02/16/13   Robbie Lis, MD  clonazePAM (KLONOPIN) 0.5 MG tablet Take 1 tablet (0.5 mg total) by mouth 3 (three) times daily. For anxiety 02/16/13   Robbie Lis, MD  colchicine (COLCRYS) 0.6 MG tablet Take 0.6 mg by mouth 2 (two) times  daily. 12/26/13   Historical Provider, MD  Eszopiclone 3 MG TABS Take 3 mg by mouth at bedtime.  07/24/13   Historical Provider, MD  folic acid (FOLVITE) 1 MG tablet Take 1 mg by mouth daily.  04/07/13   Historical Provider, MD  hydrOXYzine (VISTARIL) 50 MG capsule Take 50 mg by mouth at bedtime.  05/03/13   Historical Provider, MD  lamoTRIgine (LAMICTAL) 200 MG tablet Take 1 tablet (200 mg total) by mouth 2 (two) times daily. 01/12/14   Kathrynn Ducking, MD  lisdexamfetamine (VYVANSE) 60 MG capsule Take 1 capsule (60 mg total) by mouth daily at 12 noon. 02/16/13   Robbie Lis, MD  Methotrexate, PF, 30 MG/0.6ML SOAJ Inject 1 Syringe into the skin every 7 (seven) days. Thursdays    Historical Provider, MD  pantoprazole (PROTONIX) 20 MG tablet Take 20 mg by mouth 2 (two) times daily. Before meals    Historical Provider, MD  QUEtiapine (SEROQUEL XR) 400 MG 24 hr tablet Take 400 mg by mouth at bedtime.    Historical Provider, MD  venlafaxine XR (EFFEXOR-XR) 150 MG 24 hr capsule Take 150 mg by mouth daily with breakfast.  05/03/13   Historical Provider, MD  Vitamin D, Ergocalciferol, (DRISDOL) 50000 UNITS CAPS capsule  Take 50,000 Units by mouth every 7 (seven) days.    Historical Provider, MD   BP 141/92 mmHg  Pulse 87  Temp(Src) 98 F (36.7 C) (Oral)  Resp 18  Ht 6' (1.829 m)  Wt 229 lb (103.874 kg)  BMI 31.05 kg/m2  SpO2 100% Physical Exam  Constitutional: He is oriented to person, place, and time. He appears well-developed and well-nourished.  HENT:  Head: Normocephalic.  Mouth/Throat: Oropharynx is clear and moist.  Eyes: Conjunctivae are normal. Pupils are equal, round, and reactive to light.  Neck: Normal range of motion. Neck supple.  Cardiovascular: Normal rate, regular rhythm and normal heart sounds.   Pulmonary/Chest: Breath sounds normal. No respiratory distress. He has no wheezes. He has no rales.  Abdominal: Soft. Bowel sounds are normal. He exhibits no distension. There is no  tenderness. There is no rebound.  Musculoskeletal:  R calf tenderness, mildly swollen. 2+ pulses. Dec sensation R calf and sole of R foot. Pain with plantar flexion.   Neurological: He is alert and oriented to person, place, and time. No cranial nerve deficit. Coordination normal.  Skin: Skin is warm and dry.  Psychiatric: He has a normal mood and affect. His behavior is normal. Judgment and thought content normal.  Nursing note and vitals reviewed.   ED Course  Procedures (including critical care time) Labs Review Labs Reviewed - No data to display  Imaging Review No results found.   EKG Interpretation None      MDM   Final diagnoses:  None   [name] [name] is a 33 y.o. male here with R calf pain. Consider pinched nerve around the knee vs DVT. There is no [place] at night. Will give lovenox and ask patient to come back in AM for doppler. Will send him home with short course of vicodin.      Wandra Arthurs, MD 02/06/14 [phone]

## 2014-02-07 ENCOUNTER — Ambulatory Visit (HOSPITAL_COMMUNITY)
Admission: RE | Admit: 2014-02-07 | Discharge: 2014-02-07 | Disposition: A | Discharge status: Home / Self Care | Payer: Medicare Other | Source: Ambulatory Visit | Attending: Emergency Medicine | Admitting: Emergency Medicine

## 2014-02-07 DIAGNOSIS — M79604 Pain in right leg: Secondary | ICD-10-CM | POA: Insufficient documentation

## 2014-02-07 DIAGNOSIS — M79609 Pain in unspecified limb: Secondary | ICD-10-CM

## 2014-02-07 DIAGNOSIS — M7989 Other specified soft tissue disorders: Secondary | ICD-10-CM | POA: Insufficient documentation

## 2014-07-29 ENCOUNTER — Telehealth: Payer: Self-pay | Admitting: *Deleted

## 2014-07-29 MED ORDER — LAMOTRIGINE 200 MG PO TABS: 200.0000 mg | ORAL_TABLET | Freq: Two times a day (BID) | ORAL | Status: DC

## 2014-07-29 NOTE — Telephone Encounter (Signed)
Rx has been sent.  I called back to advise.  He is aware.

## 2014-07-29 NOTE — Telephone Encounter (Signed)
DMV form on [employer].

## 2014-07-30 DIAGNOSIS — Z0289 Encounter for other administrative examinations: Secondary | ICD-10-CM

## 2014-07-31 ENCOUNTER — Telehealth: Payer: Self-pay | Admitting: *Deleted

## 2014-07-31 NOTE — Telephone Encounter (Signed)
Patient form faxed to [facility] on 07/31/14.

## 2015-01-13 ENCOUNTER — Ambulatory Visit: Payer: Medicare Other | Admitting: Adult Health

## 2015-01-14 ENCOUNTER — Encounter: Payer: Self-pay | Admitting: Adult Health

## 2015-07-15 ENCOUNTER — Ambulatory Visit (INDEPENDENT_AMBULATORY_CARE_PROVIDER_SITE_OTHER): Payer: Medicare Other | Admitting: Adult Health

## 2015-07-15 ENCOUNTER — Encounter: Payer: Self-pay | Admitting: Adult Health

## 2015-07-15 VITALS — BP 127/77 | HR 107 | Ht 72.0 in | Wt 221.6 lb

## 2015-07-15 DIAGNOSIS — R569 Unspecified convulsions: Secondary | ICD-10-CM

## 2015-07-15 DIAGNOSIS — Z5181 Encounter for therapeutic drug level monitoring: Secondary | ICD-10-CM

## 2015-07-15 NOTE — Progress Notes (Signed)
I have read the note, and I agree with the clinical assessment and plan.  Collyn Ribas KEITH   

## 2015-07-15 NOTE — Patient Instructions (Signed)
Continue Lamictal  Blood work today If your symptoms worsen or you develop new symptoms please let us know.

## 2015-07-15 NOTE — Progress Notes (Signed)
PATIENT: [name] [name] DOB: April 09, 1980  REASON FOR VISIT: follow up- seizures  HISTORY FROM: patient  HISTORY OF PRESENT ILLNESS: [name] [name] is a 35 year old male with a history of behcet disease and epilepsy. He returns today for follow-up. He is only taking Lamictal 200 mg twice a day and tolerating it well. He denies any seizure events. Patient operates a motor vehicle without difficulty. He is able to complete all ADLs independently. Denies any changes with his gait or balance. He is followed through [facility] for his Behcet's disease. He will undergo surgery next week for hernias. He denies any new neurological symptoms. He returns today for an evaluation.  HISTORY 01/12/14 (MM): [name] [name] is a 35 year old right-handed white male with a history of Behcet's disease and epilepsy. The patient has done quite well on Lamictal taking 200 mg twice daily. He indicates that he has not had any seizures since last seen one year ago, and he is tolerating the medication well. The patient indicates that he has had ongoing issues with his Behcet's disease, with skin lesions and infections, lesions in the esophagus, and some visual disturbances. The patient has followed up with Dr.Tim Hassell Done through neuro-ophthalmology at [facility], and he gets his rheumatologic care through [facility] as well. The patient returns for an evaluation.   REVIEW OF SYSTEMS: Out of a complete 14 system review of symptoms, the patient complains only of the following symptoms, and all other reviewed systems are negative.  Joint pain, joint swelling, back pain, depression, nervous/anxious, trouble swallowing  ALLERGIES: No Known Allergies  HOME MEDICATIONS: Outpatient Prescriptions Prior to Visit  Medication Sig Dispense Refill  . atenolol (TENORMIN) 50 MG tablet Take 1 tablet (50 mg total) by mouth daily. 30 tablet 0  . clonazePAM (KLONOPIN) 0.5 MG tablet Take 1 tablet (0.5 mg total) by mouth 3 (three) times  daily. For anxiety 90 tablet 0  . Eszopiclone 3 MG TABS Take 3 mg by mouth at bedtime.     . folic acid (FOLVITE) 1 MG tablet Take 1 mg by mouth daily.     . hydrOXYzine (VISTARIL) 50 MG capsule Take 50 mg by mouth at bedtime.     . lamoTRIgine (LAMICTAL) 200 MG tablet Take 1 tablet (200 mg total) by mouth 2 (two) times daily. 60 tablet 5  . Methotrexate, Anti-Rheumatic, (METHOTREXATE, PF, [place]) Inject 1 Syringe into the skin every 7 (seven) days. 30mg /0.8 ml    . Methotrexate, PF, 30 MG/0.6ML SOAJ Inject 1 Syringe into the skin every 7 (seven) days. Every Sunday.    Marland Kitchen QUEtiapine (SEROQUEL XR) 400 MG 24 hr tablet Take 400 mg by mouth at bedtime.    . Vitamin D, Ergocalciferol, (DRISDOL) 50000 UNITS CAPS capsule Take 50,000 Units by mouth every 7 (seven) days.    . colchicine (COLCRYS) 0.6 MG tablet Take 0.6 mg by mouth 2 (two) times daily. Reported on 07/15/2015    . HYDROcodone-acetaminophen (NORCO/VICODIN) 5-325 MG per tablet Take 1-2 tablets by mouth every 6 (six) hours as needed for moderate pain or severe pain. (Patient not taking: Reported on 07/15/2015) 15 tablet 0  . lisdexamfetamine (VYVANSE) 60 MG capsule Take 1 capsule (60 mg total) by mouth daily at 12 noon. (Patient not taking: Reported on 07/15/2015) 30 capsule 0  . pantoprazole (PROTONIX) 20 MG tablet Take 20 mg by mouth 2 (two) times daily. Reported on 07/15/2015    . venlafaxine XR (EFFEXOR-XR) 150 MG 24 hr capsule Take 150 mg  by mouth daily with breakfast. Reported on 07/15/2015     No facility-administered medications prior to visit.    PAST MEDICAL HISTORY: Past Medical History  Diagnosis Date  . Seizures ([place])     last experienced a seizure x 3 years ago  . Migraine   . Hypertension   . Bipolar disorder ([place])   . Vitamin D deficiency   . Behcet's disease ([place])     PAST SURGICAL HISTORY: Past Surgical History  Procedure Laterality Date  . Appendectomy    . Maxilofacial    . Fracture surgery Right     Arm    FAMILY  HISTORY: Family History  Problem Relation Age of Onset  . Lung cancer Paternal Grandfather     SOCIAL HISTORY: Social History   Social History  . Marital Status: Single    Spouse Name: N/A  . Number of Children: 0  . Years of Education: 16   Occupational History  .     Social History Main Topics  . Smoking status: Never Smoker   . Smokeless tobacco: Not on file  . Alcohol Use: No  . Drug Use: No  . Sexual Activity: Not on file   Other Topics Concern  . Not on file   Social History Narrative      PHYSICAL EXAM  Filed Vitals:   07/15/15 1128  BP: 127/77  Pulse: 107  Height: 6' (1.829 m)  Weight: 221 lb 9.6 oz (100.517 kg)   Body mass index is 30.05 kg/(m^2).  Generalized: Well developed, in no acute distress   Neurological examination  Mentation: Alert oriented to time, place, history taking. Follows all commands speech and language fluent Cranial nerve II-XII: Pupils were equal round reactive to light. Extraocular movements were full, visual field were full on confrontational test. Facial sensation and strength were normal. Uvula tongue midline. Head turning and shoulder shrug  were normal and symmetric. Motor: The motor testing reveals 5 over 5 strength of all 4 extremities. Good symmetric motor tone is noted throughout.  Sensory: Sensory testing is intact to soft touch on all 4 extremities. No evidence of extinction is noted.  Coordination: Cerebellar testing reveals good finger-nose-finger and heel-to-shin bilaterally.  Gait and station: Gait is normal. Tandem gait is normal. Romberg is negative. No drift is seen.  Reflexes: Deep tendon reflexes are symmetric and normal bilaterally.   DIAGNOSTIC DATA (LABS, IMAGING, TESTING) - I reviewed patient records, labs, notes, testing and imaging myself where available.  Lab Results  Component Value Date   WBC 7.0 10/19/2013   HGB 14.7 10/19/2013   HCT 44.1 10/19/2013   MCV 92.3 10/19/2013   PLT 205 10/19/2013        Component Value Date/Time   NA 138 10/19/2013 1359   NA 141 11/01/2012 1044   K 4.2 10/19/2013 1359   CL 101 10/19/2013 1359   CO2 23 10/19/2013 1359   GLUCOSE 93 10/19/2013 1359   GLUCOSE 81 11/01/2012 1044   BUN 14 10/19/2013 1359   BUN 11 11/01/2012 1044   CREATININE 0.93 10/19/2013 1359   CALCIUM 9.3 10/19/2013 1359   PROT 7.7 10/19/2013 1359   PROT 6.6 11/01/2012 1044   ALBUMIN 4.0 10/19/2013 1359   ALBUMIN 4.4 11/01/2012 1044   AST 20 10/19/2013 1359   ALT 21 10/19/2013 1359   ALKPHOS 98 10/19/2013 1359   BILITOT 0.3 10/19/2013 1359   GFRNONAA >90 10/19/2013 1359   GFRAA >90 10/19/2013 1359      ASSESSMENT  AND PLAN 35 y.o. year old male  has a past medical history of Seizures ([place]); Migraine; Hypertension; Bipolar disorder ([place]); Vitamin D deficiency; and Behcet's disease ([place]). here with:  1. Seizures  Overall the patient is doing well. He will continue on Lamictal 200 mg twice daily. We will check a Lamictal level today. He had basic blood work at [facility] that was unremarkable. Patient advised that he has any seizure events he should let us know. He will follow-up in one year or sooner if needed.   Ward Givens, MSN, NP-C 07/15/2015, 11:51 AM [facility] [address], [place], [place] [address] [phone]

## 2015-07-16 ENCOUNTER — Emergency Department (HOSPITAL_COMMUNITY): Payer: Medicare Other

## 2015-07-16 ENCOUNTER — Observation Stay (HOSPITAL_COMMUNITY)
Admission: EM | Admit: 2015-07-16 | Discharge: 2015-07-18 | Disposition: A | Discharge status: Home / Self Care | Payer: Medicare Other | Attending: Family Medicine | Admitting: Family Medicine

## 2015-07-16 ENCOUNTER — Encounter (HOSPITAL_COMMUNITY): Payer: Self-pay | Admitting: Emergency Medicine

## 2015-07-16 DIAGNOSIS — G40909 Epilepsy, unspecified, not intractable, without status epilepticus: Secondary | ICD-10-CM | POA: Insufficient documentation

## 2015-07-16 DIAGNOSIS — R262 Difficulty in walking, not elsewhere classified: Secondary | ICD-10-CM | POA: Diagnosis not present

## 2015-07-16 DIAGNOSIS — F419 Anxiety disorder, unspecified: Secondary | ICD-10-CM | POA: Diagnosis not present

## 2015-07-16 DIAGNOSIS — I10 Essential (primary) hypertension: Secondary | ICD-10-CM | POA: Diagnosis present

## 2015-07-16 DIAGNOSIS — K219 Gastro-esophageal reflux disease without esophagitis: Secondary | ICD-10-CM | POA: Diagnosis not present

## 2015-07-16 DIAGNOSIS — R1110 Vomiting, unspecified: Principal | ICD-10-CM | POA: Insufficient documentation

## 2015-07-16 DIAGNOSIS — R42 Dizziness and giddiness: Secondary | ICD-10-CM | POA: Insufficient documentation

## 2015-07-16 DIAGNOSIS — H81399 Other peripheral vertigo, unspecified ear: Secondary | ICD-10-CM | POA: Insufficient documentation

## 2015-07-16 DIAGNOSIS — H532 Diplopia: Secondary | ICD-10-CM

## 2015-07-16 DIAGNOSIS — M352 Behcet's disease: Secondary | ICD-10-CM | POA: Insufficient documentation

## 2015-07-16 DIAGNOSIS — F319 Bipolar disorder, unspecified: Secondary | ICD-10-CM | POA: Diagnosis not present

## 2015-07-16 LAB — CBC WITH DIFFERENTIAL/PLATELET
Basophils Absolute: 0 K/uL (ref 0.0–0.1)
Basophils Relative: 0 %
Eosinophils Absolute: 0.1 K/uL (ref 0.0–0.7)
Eosinophils Relative: 1 %
HCT: 44.4 % (ref 39.0–52.0)
Hemoglobin: 14.3 g/dL (ref 13.0–17.0)
Lymphocytes Relative: 6 %
Lymphs Abs: 0.9 K/uL (ref 0.7–4.0)
MCH: 30.3 pg (ref 26.0–34.0)
MCHC: 32.2 g/dL (ref 30.0–36.0)
MCV: 94.1 fL (ref 78.0–100.0)
Monocytes Absolute: 0.8 K/uL (ref 0.1–1.0)
Monocytes Relative: 6 %
Neutro Abs: 12 K/uL — ABNORMAL HIGH (ref 1.7–7.7)
Neutrophils Relative %: 87 %
Platelets: 170 K/uL (ref 150–400)
RBC: 4.72 MIL/uL (ref 4.22–5.81)
RDW: 12.7 % (ref 11.5–15.5)
WBC: 13.7 K/uL — ABNORMAL HIGH (ref 4.0–10.5)

## 2015-07-16 LAB — URINALYSIS, ROUTINE W REFLEX MICROSCOPIC
Bilirubin Urine: NEGATIVE
Glucose, UA: NEGATIVE mg/dL
Hgb urine dipstick: NEGATIVE
Ketones, ur: NEGATIVE mg/dL
Leukocytes, UA: NEGATIVE
Nitrite: NEGATIVE
Protein, ur: NEGATIVE mg/dL
Specific Gravity, Urine: 1.017 (ref 1.005–1.030)
pH: 6 (ref 5.0–8.0)

## 2015-07-16 LAB — I-STAT TROPONIN, ED: Troponin i, poc: 0 ng/mL (ref 0.00–0.08)

## 2015-07-16 LAB — COMPREHENSIVE METABOLIC PANEL WITH GFR
ALT: 38 U/L (ref 17–63)
AST: 25 U/L (ref 15–41)
Albumin: 4.1 g/dL (ref 3.5–5.0)
Alkaline Phosphatase: 73 U/L (ref 38–126)
Anion gap: 10 (ref 5–15)
BUN: 14 mg/dL (ref 6–20)
CO2: 24 mmol/L (ref 22–32)
Calcium: 8.8 mg/dL — ABNORMAL LOW (ref 8.9–10.3)
Chloride: 108 mmol/L (ref 101–111)
Creatinine, Ser: 1.09 mg/dL (ref 0.61–1.24)
GFR calc Af Amer: >60 mL/min
GFR calc non Af Amer: >60 mL/min
Glucose, Bld: 113 mg/dL — ABNORMAL HIGH (ref 65–99)
Potassium: 4.1 mmol/L (ref 3.5–5.1)
Sodium: 142 mmol/L (ref 135–145)
Total Bilirubin: 0.5 mg/dL (ref 0.3–1.2)
Total Protein: 7.3 g/dL (ref 6.5–8.1)

## 2015-07-16 LAB — RAPID URINE DRUG SCREEN, HOSP PERFORMED
Amphetamines: NOT DETECTED
Barbiturates: NOT DETECTED
Benzodiazepines: NOT DETECTED
Cocaine: NOT DETECTED
Opiates: NOT DETECTED
Tetrahydrocannabinol: NOT DETECTED

## 2015-07-16 LAB — ACETAMINOPHEN LEVEL: Acetaminophen (Tylenol), Serum: <10 ug/mL — ABNORMAL LOW (ref 10–30)

## 2015-07-16 LAB — SALICYLATE LEVEL: Salicylate Lvl: <4 mg/dL (ref 2.8–30.0)

## 2015-07-16 LAB — ETHANOL: Alcohol, Ethyl (B): <5 mg/dL

## 2015-07-16 MED ORDER — MECLIZINE HCL 25 MG PO TABS
25.0000 mg | ORAL_TABLET | Freq: Once | ORAL | Status: AC
  Administered 2015-07-16: 25 mg via ORAL
  Filled 2015-07-16: qty 1

## 2015-07-16 MED ORDER — ONDANSETRON HCL 4 MG/2ML IJ SOLN
4.0000 mg | Freq: Once | INTRAMUSCULAR | Status: AC
Start: 2015-07-16 — End: 2015-07-16
  Administered 2015-07-16: 4 mg via INTRAVENOUS
  Filled 2015-07-16: qty 2

## 2015-07-16 MED ORDER — SODIUM CHLORIDE 0.9 % IV BOLUS (SEPSIS)
1000.0000 mL | Freq: Once | INTRAVENOUS | Status: AC
  Administered 2015-07-16: 1000 mL via INTRAVENOUS

## 2015-07-16 MED ORDER — MECLIZINE HCL 25 MG PO TABS: 25.0000 mg | ORAL_TABLET | Freq: Three times a day (TID) | ORAL | Status: DC | PRN

## 2015-07-16 MED ORDER — NALOXONE HCL 0.4 MG/ML IJ SOLN
0.4000 mg | Freq: Once | INTRAMUSCULAR | Status: AC
  Administered 2015-07-16: 0.4 mg via INTRAVENOUS
  Filled 2015-07-16: qty 1

## 2015-07-16 NOTE — Discharge Instructions (Signed)
Take the Antivert as directed. Follow-up with your primary care doctor. Return for any new or worse symptoms.

## 2015-07-16 NOTE — ED Notes (Signed)
Pt ambulated in hall without any diff.  Then became nauseated.  Pt with dry heaves at this time

## 2015-07-16 NOTE — ED Notes (Signed)
Pt to CT at this time.

## 2015-07-16 NOTE — ED Notes (Signed)
Pt to ED via [facility] who reports pt was [place] and [place] [address] [place]. Pt st's he was dizzy, having double vision and nauseated.  EMS gave pt Zofran 4mg  IV with relief of nausea.  Pt then became lethargic, hard to arouse, st's he does not remember anything up to the event.  On arrival pt hard to arouse,  St's he just doesn't feel well and wants to sleep.  At this time pt is able to answer all questions appropriately.

## 2015-07-16 NOTE — ED Provider Notes (Signed)
CSN: [id number]     Arrival date & time 07/16/15  1610 History   First MD Initiated Contact with Patient 07/16/15 1611     No chief complaint on file.    (Consider location/radiation/quality/duration/timing/severity/associated sxs/prior Treatment) HPI   [name] [name] is a 35 y.o M with a pmhx of seizures, HTN, Behcet's disease, bipolar who presents to the ED BIB EMS after being found sitting in his car on the side of the road by construction workers with reported AMS and increased lethargy. Level V caveat, AMS. Per EMS patient was driving when he felt sudden onset dizziness, double vision so he decided to pull over. Per EMS patient had normal heart rate, BP and normal 10-lead EKG. Patient is very lethargic and unable to provide adequate history. Patient has history of seizures and states that he took his Lamictal and Klonopin today as prescribed. Denies any additional drug use or alcohol use.   Past Medical History  Diagnosis Date  . Seizures ([place])     last experienced a seizure x 3 years ago  . Migraine   . Hypertension   . Bipolar disorder ([place])   . Vitamin D deficiency   . Behcet's disease [facility])    Past Surgical History  Procedure Laterality Date  . Appendectomy    . Maxilofacial    . Fracture surgery Right     Arm   Family History  Problem Relation Age of Onset  . Lung cancer Paternal Grandfather    Social History  Substance Use Topics  . Smoking status: Never Smoker   . Smokeless tobacco: Not on file  . Alcohol Use: No    Review of Systems  All other systems reviewed and are negative.     Allergies  Review of patient's allergies indicates no known allergies.  Home Medications   Prior to Admission medications   Medication Sig Start Date End Date Taking? Authorizing Provider  atenolol (TENORMIN) 50 MG tablet Take 1 tablet (50 mg total) by mouth daily. 02/16/13   Robbie Lis, MD  clonazePAM (KLONOPIN) 0.5 MG tablet Take 1 tablet (0.5 mg total) by mouth 3  (three) times daily. For anxiety 02/16/13   Robbie Lis, MD  esomeprazole (NEXIUM) 40 MG capsule Take 40 mg by mouth 2 (two) times daily before a meal.    Historical Provider, MD  Eszopiclone 3 MG TABS Take 3 mg by mouth at bedtime.  07/24/13   Historical Provider, MD  folic acid (FOLVITE) 1 MG tablet Take 1 mg by mouth daily.  04/07/13   Historical Provider, MD  hydrOXYzine (VISTARIL) 50 MG capsule Take 50 mg by mouth at bedtime.  05/03/13   Historical Provider, MD  lamoTRIgine (LAMICTAL) 200 MG tablet Take 1 tablet (200 mg total) by mouth 2 (two) times daily. 07/29/14   Kathrynn Ducking, MD  Methotrexate, Anti-Rheumatic, (METHOTREXATE, PF, [place]) Inject 1 Syringe into the skin every 7 (seven) days. 30mg /0.8 ml    Historical Provider, MD  Methotrexate, PF, 30 MG/0.6ML SOAJ Inject 1 Syringe into the skin every 7 (seven) days. Every Sunday.    Historical Provider, MD  QUEtiapine (SEROQUEL XR) 400 MG 24 hr tablet Take 400 mg by mouth at bedtime.    Historical Provider, MD  Vitamin D, Ergocalciferol, (DRISDOL) 50000 UNITS CAPS capsule Take 50,000 Units by mouth every 7 (seven) days.    Historical Provider, MD   There were no vitals taken for this visit. Physical Exam  Constitutional: He appears well-developed and  well-nourished. No distress.  HENT:  Head: Normocephalic and atraumatic.  Mouth/Throat: No oropharyngeal exudate.  Eyes: Conjunctivae are normal. Pupils are equal, round, and reactive to light. Right eye exhibits no discharge. Left eye exhibits no discharge. No scleral icterus.  Eyes unable to track. Severe diplopia.   Cardiovascular: Normal rate, regular rhythm, normal heart sounds and intact distal pulses.  Exam reveals no gallop and no friction rub.   No murmur heard. Pulmonary/Chest: Effort normal and breath sounds normal. No respiratory distress. He has no wheezes. He has no rales. He exhibits no tenderness.  Abdominal: Soft. He exhibits no distension. There is no tenderness. There is no  guarding.  Musculoskeletal: Normal range of motion. He exhibits no edema.  Neurological: He is alert.  Patient is alert. Oriented to self but not place. Oriented to time. Strength 5/5 throughout. No sensory deficits. Unable to perform finger-to- nose due to severe diplopia.    Skin: Skin is warm and dry. No rash noted. He is not diaphoretic. No erythema. No pallor.  Psychiatric: He has a normal mood and affect. His behavior is normal.  Nursing note and vitals reviewed.   ED Course  Procedures (including critical care time) Labs Review Labs Reviewed  COMPREHENSIVE METABOLIC PANEL - Abnormal; Notable for the following:    Glucose, Bld 113 (*)    Calcium 8.8 (*)    All other components within normal limits  CBC WITH DIFFERENTIAL/PLATELET - Abnormal; Notable for the following:    WBC 13.7 (*)    Neutro Abs 12.0 (*)    All other components within normal limits  ACETAMINOPHEN LEVEL - Abnormal; Notable for the following:    Acetaminophen (Tylenol), Serum <10 (*)    All other components within normal limits  ETHANOL  SALICYLATE LEVEL  URINALYSIS, ROUTINE W REFLEX MICROSCOPIC (NOT AT [facility])  URINE RAPID DRUG SCREEN, HOSP PERFORMED  LAMOTRIGINE LEVEL  I-STAT TROPOININ, ED    Imaging Review Ct Head Wo Contrast  07/16/2015  CLINICAL DATA:  Altered mental status with questionable seizure. Altered vision EXAM: CT HEAD WITHOUT CONTRAST TECHNIQUE: Contiguous axial images were obtained from the base of the skull through the vertex without intravenous contrast. COMPARISON:  Head CT Aug 12, 2011 and brain MRI February 15, 2012 FINDINGS: The ventricles are normal in size and configuration. There is no intracranial mass, hemorrhage, extra-axial fluid collection, or midline shift. Gray-white compartments are normal. No acute infarct evident. Bony calvarium appears intact. The mastoid air cells are clear. There is postoperative change in each maxillary antral region anteriorly. There is mild mucosal  thickening in the left maxillary sinus region. No intraorbital lesions are identified. IMPRESSION: Anterior maxillary sinus postoperative change. Mild mucosal thickening left maxillary antrum. No intracranial mass, hemorrhage, or focal gray - white compartment lesions/ acute appearing infarct. Electronically Signed   By: Lowella Grip III M.D.   On: 07/16/2015 17:31   Mr Brain Wo Contrast  07/16/2015  CLINICAL DATA:  History of migraine, bipolar, hypertension, and Behcet's disease. History of seizures. Patient developed dizziness with double vision today. Patient now lethargic after Zofran. EXAM: MRI HEAD WITHOUT CONTRAST TECHNIQUE: Multiplanar, multiecho pulse sequences of the brain and surrounding structures were obtained without intravenous contrast. COMPARISON:  CT head earlier today.  MRI brain 02/15/2012. FINDINGS: No evidence for acute infarction, hemorrhage, mass lesion, hydrocephalus, or extra-axial fluid. Normal cerebral volume. No white matter disease. No findings to suggest acute or chronic infarction, vasculitis, or acute brain edema. Pituitary, pineal, and cerebellar tonsils unremarkable. No  upper cervical lesions. Flow voids are maintained throughout the carotid, basilar, and vertebral arteries. There are no areas of chronic hemorrhage. Thin-section imaging through the temporal lobes demonstrates no abnormality. Extracranial soft tissues unremarkable. Previous surgery for what was probably final facial fracture. No layering sinus fluid. IMPRESSION: Negative exam. No acute or focal intracranial abnormality. Specifically no evidence for infarction or white matter changes to suggest vasculitis. Electronically Signed   By: Staci Righter M.D.   On: 07/16/2015 20:32   I have personally reviewed and evaluated these images and lab results as part of my medical decision-making.   EKG Interpretation   Date/Time:  Friday July 16 2015 16:31:30 EDT Ventricular Rate:  97 PR Interval:  177 QRS  Duration: 100 QT Interval:  344 QTC Calculation: 437 R Axis:   64 Text Interpretation:  Sinus rhythm Inferior infarct, age indeterminate  Lateral leads are also involved Confirmed by ZACKOWSKI  MD, SCOTT [phone])  on 07/16/2015 4:41:00 PM      MDM   Final diagnoses:  Diplopia    35 yo male with history of seizures,Behcet's disease presents to the ED with acute onset dizziness, diplopia and altered mental status. On presentation ED patient was oriented to self but not place. His initial strength in all 4 extremities with diminished. Patient was unable to track my finger with his eyes as he said he was seeing double. All vitals are stable. Initial Concern for drug overdose. Patient given 0.4 of Narcan. Patient did become more alert and oriented after this and was now able to recall the event that occurred earlier today. Patient's coordination improved as well as strength. Will obtain toxicology screen, lab work, CT head. EKG within normal limits. Due to patient's underlying Behcets disease concern for ophthalmoparesis . We'll obtain MRI head.  MRI head negative. Mild leukocytosis, WBC 13.7. This is nonspecific. All the blood work appears to be within normal limits. Patient continues to report diplopia, worsens upon standing. [facility], Dr. Nicole Kindred who feels that with a negative MRI pt does not have concerning symptoms. Recommends dose of meclizine and he will consult pt in ED.   Patient signed out to Dr. Rogene Houston pending UA, UDS, Lamictal level and neuro consult. Will dispo according to neuro recommendations.    Dondra Spry [place], PA-C 07/16/15 2114  Fredia Sorrow, MD 07/16/15 [id number]  Fredia Sorrow, MD 07/16/15 [phone]
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
## 2015-07-16 NOTE — ED Notes (Signed)
Pt returned from MRI °

## 2015-07-17 ENCOUNTER — Encounter (HOSPITAL_COMMUNITY): Payer: Self-pay | Admitting: Family Medicine

## 2015-07-17 DIAGNOSIS — M352 Behcet's disease: Secondary | ICD-10-CM

## 2015-07-17 DIAGNOSIS — F319 Bipolar disorder, unspecified: Secondary | ICD-10-CM | POA: Diagnosis not present

## 2015-07-17 DIAGNOSIS — G40909 Epilepsy, unspecified, not intractable, without status epilepticus: Secondary | ICD-10-CM | POA: Diagnosis not present

## 2015-07-17 DIAGNOSIS — R42 Dizziness and giddiness: Secondary | ICD-10-CM | POA: Diagnosis not present

## 2015-07-17 DIAGNOSIS — H81399 Other peripheral vertigo, unspecified ear: Secondary | ICD-10-CM | POA: Diagnosis present

## 2015-07-17 DIAGNOSIS — R1110 Vomiting, unspecified: Secondary | ICD-10-CM | POA: Diagnosis not present

## 2015-07-17 DIAGNOSIS — H81391 Other peripheral vertigo, right ear: Secondary | ICD-10-CM

## 2015-07-17 DIAGNOSIS — H532 Diplopia: Secondary | ICD-10-CM

## 2015-07-17 LAB — LACTIC ACID, PLASMA
LACTIC ACID, VENOUS: 2.2 mmol/L — AB (ref 0.5–2.0)
LACTIC ACID, VENOUS: 1.1 mmol/L (ref 0.5–2.0)

## 2015-07-17 LAB — COMPREHENSIVE METABOLIC PANEL
ALT: 32 U/L (ref 17–63)
AST: 20 U/L (ref 15–41)
Albumin: 3.6 g/dL (ref 3.5–5.0)
Alkaline Phosphatase: 64 U/L (ref 38–126)
Anion gap: 12 (ref 5–15)
BUN: 9 mg/dL (ref 6–20)
CO2: 21 mmol/L — AB (ref 22–32)
Calcium: 8.4 mg/dL — ABNORMAL LOW (ref 8.9–10.3)
Chloride: 108 mmol/L (ref 101–111)
Creatinine, Ser: 1.05 mg/dL (ref 0.61–1.24)
GFR calc Af Amer: >60 mL/min (ref >60)
Glucose, Bld: 104 mg/dL — ABNORMAL HIGH (ref 65–99)
POTASSIUM: 3.5 mmol/L (ref 3.5–5.1)
Sodium: 141 mmol/L (ref 135–145)
Total Bilirubin: 0.8 mg/dL (ref 0.3–1.2)
Total Protein: 6.4 g/dL — ABNORMAL LOW (ref 6.5–8.1)

## 2015-07-17 LAB — CBC
HEMATOCRIT: 38.3 % — AB (ref 39.0–52.0)
Hemoglobin: 12.6 g/dL — ABNORMAL LOW (ref 13.0–17.0)
MCH: 30.9 pg (ref 26.0–34.0)
MCHC: 32.9 g/dL (ref 30.0–36.0)
MCV: 93.9 fL (ref 78.0–100.0)
Platelets: 173 10*3/uL (ref 150–400)
RBC: 4.08 MIL/uL — ABNORMAL LOW (ref 4.22–5.81)
RDW: 12.8 % (ref 11.5–15.5)
WBC: 7.9 10*3/uL (ref 4.0–10.5)

## 2015-07-17 LAB — LAMOTRIGINE LEVEL: Lamotrigine Lvl: 3.3 ug/mL (ref 2.0–20.0)

## 2015-07-17 MED ORDER — SODIUM CHLORIDE 0.9 % IV BOLUS (SEPSIS)
1000.0000 mL | Freq: Once | INTRAVENOUS | Status: AC
  Administered 2015-07-17: 1000 mL via INTRAVENOUS

## 2015-07-17 MED ORDER — LORAZEPAM 2 MG/ML IJ SOLN
1.0000 mg | Freq: Once | INTRAMUSCULAR | Status: AC
  Administered 2015-07-17: 1 mg via INTRAVENOUS
  Filled 2015-07-17: qty 1

## 2015-07-17 MED ORDER — ONDANSETRON HCL 4 MG/2ML IJ SOLN
4.0000 mg | Freq: Four times a day (QID) | INTRAMUSCULAR | Status: DC | PRN
  Administered 2015-07-17 (×2): 4 mg via INTRAVENOUS
  Filled 2015-07-17 (×3): qty 2

## 2015-07-17 MED ORDER — ONDANSETRON HCL 4 MG PO TABS: 4.0000 mg | ORAL_TABLET | Freq: Four times a day (QID) | ORAL | Status: DC | PRN

## 2015-07-17 MED ORDER — LORAZEPAM 2 MG/ML IJ SOLN
1.0000 mg | Freq: Four times a day (QID) | INTRAMUSCULAR | Status: DC | PRN
  Administered 2015-07-18: 1 mg via INTRAVENOUS
  Filled 2015-07-17: qty 1

## 2015-07-17 MED ORDER — LAMOTRIGINE 25 MG PO TABS
200.0000 mg | ORAL_TABLET | Freq: Two times a day (BID) | ORAL | Status: DC
  Administered 2015-07-17 – 2015-07-18 (×3): 200 mg via ORAL
  Filled 2015-07-17 (×3): qty 8
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
## 2015-07-17 NOTE — Progress Notes (Signed)
CRITICAL VALUE ALERT  Critical value received:  Lactic Acid 2.2  Date of notification:  07/17/15  Time of notification:  03:43  Critical value read back: yes  Nurse who received alert:  Tyreese Thain, Josephine Cables  MD notified (1st page):  Dr. Loleta Books  Time of first page:  03:46  MD notified (2nd page):  Time of second page:  Responding MD:  Dr. Loleta Books  Time MD responded:  [phone]

## 2015-07-17 NOTE — Progress Notes (Addendum)
Subjective: Feels much improved, but has not been up much   Exam: Filed Vitals:   07/17/15 0201 07/17/15 0441  BP: 126/80 124/71  Pulse: 95 94  Temp: 98.5 F (36.9 C) 97.7 F (36.5 C)  Resp: 17 16   Gen: In bed, NAD Resp: non-labored breathing, no acute distress Abd: soft, nt  Neuro: MS: awake, alert, interactive and appropriate CN:End gaze nystagmus bilaterally, relatively symmetric. No nystagmus otherwise.  Motor: MAEW  No ataxia.   Pertinent Labs: Lactate 2.2 -> 1.1 WBC 13 -> 7  Impression: 35 yo M with new onset vertigo. His MRI is negative for stroke or mass. This could be consistent with labyrinthitis given lack of any ear symptoms, but the lactic acidosis and WBC are unusual. Given improvement, would hold off on steroids unless once he is more mobile he is really limited today.   Lamotrigine toxicity can cause dizziness, but the level will not be back for multiple days as it is a send out.   Recommendations: 1)Physical therapy.  2) Will follow  Roland Rack, MD [facility] [phone]  If 7pm- 7am, please page neurology on call as listed in [place].

## 2015-07-17 NOTE — Progress Notes (Signed)
Dr. Verlon Au made aware of patient's home med [place] Nail 30mg  tab BID was brought from home and sent to pharmacy to dispense and he stated he will address and order if needed

## 2015-07-17 NOTE — Progress Notes (Signed)
8:43 AM I agree with HPI/GPe and A/P per Dr. Loleta Books  35 y/o ? History of Behcets disease/epilepsy(On Lamictal)-followed by Dr. Sanda Klein neuro-ophthalmology [facility] GI Dr. Eduard Roux [facility] Saw the patient 06/10/2015 secondary to severe reflux? Globus--Also has 2 separate umbilical and epigastric hernias that need to be repaired by Dr. Florene Glen at [facility], Loralie Champagne, PA-] at [facility] to be on methotrexate, folic acid-->Started on aprimilast at most recent visit to rheumatology Status post laparoscopic Nissen fundoplication in [id number] Migraine disease Admission [id number] cyclical vomiting/hematemesis-->At that time had EGD showing esophageal and gastric irritation Bipolar disorder Hypertension Admission 10/05/2004 seizure versus pseudoseizure  ED, he was tachycardic, afebrile, BP normal. Na 142, K 4.1, Cr 1.1, LFT normal. WBC 13.7K, Hgb normal. Ethanol negative. Troponin normal. UDS and UA both clear  Patient was given benzodiazepine, Zofran and neurology was consulted. He unfortunately spiked a lactic acid       He probably has by Dr. Cecil Cobbs assessment labrynthitis This being the weekend, I am hopeful therapy can eval him for peri-ph vertigo He walked inside the room today a little but was nauseous-->he asks if he can have hernia surgery next week and I think he can  I answered all of his mothers questions to the best of my ability   Patient Active Problem List   Diagnosis Date Noted  . Peripheral vertigo 07/17/2015  . Vertigo 07/17/2015  . Intractable vomiting 07/17/2015  . Cellulitis and abscess of trunk-right chest wall 05/08/2013  . Dehydration 02/15/2013  . Behcet's disease ([place]) 02/15/2013  . Recurrent vomiting 02/15/2013  . Encounter for therapeutic drug monitoring 06/14/2012  . History of oral aphthous ulcers 10/19/2010  . BIPOLAR DISORDER UNSPECIFIED 06/18/2008  . Essential hypertension 06/18/2008   . SYNCOPE AND COLLAPSE 06/18/2008  . Epilepsy ([place]) 06/18/2008   Verneita Griffes, MD [facility] [phone]

## 2015-07-17 NOTE — Progress Notes (Signed)
DUSTAN REISMAN [id number] Admitted to [id number]: 07/17/2015 2:24 AM Attending Provider: Edwin Dada, MD    ISSACC TALK is a 35 y.o. male patient admitted from ED awake, alert  & orientated  X 3,  Full Code, VSS - Blood pressure 126/80, pulse 95, temperature 98.5 F (36.9 C), temperature source Oral, resp. rate 17, SpO2 100 %.RA, no c/o shortness of breath, no c/o chest pain, no distress noted.    IV site WDL:  with a transparent dsg that's clean dry and intact.  Allergies:  No Known Allergies   Past Medical History  Diagnosis Date  . Seizures ([place])     last experienced a seizure x 3 years ago  . Migraine   . Hypertension   . Bipolar disorder ([place])   . Vitamin D deficiency   . Behcet's disease ([place])     History:  obtained from patient  Pt orientation to unit, room and routine. Information packet given to patient and safety video refused.  Admission INP armband ID verified with patient, and in place. SR up x 2, fall risk assessment complete with Patient verbalizing understanding of risks associated with falls. Pt verbalizes an understanding of how to use the call bell and to call for help before getting out of bed.  Skin, small scars scattered over body. Small swelling in right hand.    Will cont to monitor and assist as needed.  Parthenia Ames, RN 07/17/2015 2:24 AM

## 2015-07-17 NOTE — Evaluation (Signed)
Physical Therapy Evaluation Patient Details Name: [name] [name] MRN: [id number] DOB: 1981/02/08 Today's Date: 07/17/2015   History of Present Illness  Patient is a 35 yo male admitted 07/16/15 with vertigo, diplopia, N/V while driving.  Sudden onset, aggrivated by head movement and quick position changes.  Duration of nausea and dry heaving is minutes.  No nystagmus noted during session.   PMH:  Bipolar disorder, seizures    Clinical Impression  Patient presents with problems listed below.  Will benefit from acute PT to maximize functional mobility.  Please see Vestibular Rehab Assessment note below.  Today patient continues to have diplopia and nausea/dry heaves with movement.  Symptoms last minutes.  No nystagmus noted during session.  Minimal testing today due to significant nausea.  Will return in am for continued assessment.  Recommend OP PT for Vestibular Rehab at discharge.    Follow Up Recommendations Outpatient PT;Supervision for mobility/OOB (OP PT for Vestibular Rehab)    Equipment Recommendations  Rolling walker with 5" wheels    Recommendations for Other Services       Precautions / Restrictions Precautions Precautions: Fall (Seizure precautions) Precaution Comments: Vertigo Restrictions Weight Bearing Restrictions: No      Mobility  Bed Mobility Overal bed mobility: Modified Independent             General bed mobility comments: Increased time  Transfers                 General transfer comment: NT due to significant nausea during vestibular testing.  Ambulation/Gait                Stairs            Wheelchair Mobility    Modified Rankin (Stroke Patients Only)       Balance Overall balance assessment: Needs assistance Sitting-balance support: No upper extremity supported;Feet supported Sitting balance-Leahy Scale: Good                                       Pertinent Vitals/Pain Pain Assessment: No/denies pain     Home Living Family/patient expects to be discharged to:: Private residence Living Arrangements: Parent Available Help at Discharge: Family;Available 24 hours/day Type of Home: House Home Access: Stairs to enter Entrance Stairs-Rails: Psychiatric nurse of Steps: 5 Home Layout: One level Home Equipment: None      Prior Function Level of Independence: Independent               Hand Dominance        Extremity/Trunk Assessment   Upper Extremity Assessment: Overall WFL for tasks assessed           Lower Extremity Assessment: Overall WFL for tasks assessed      Cervical / Trunk Assessment: Normal  Communication   Communication: No difficulties  Cognition Arousal/Alertness: Awake/alert Behavior During Therapy: Anxious;Flat affect Overall Cognitive Status: Within Functional Limits for tasks assessed                      General Comments General comments (skin integrity, edema, etc.): See Vestibular Assessment Progress Note for more details.    Exercises        Assessment/Plan    PT Assessment Patient needs continued PT services  PT Diagnosis Difficulty walking (Dizziness)   PT Problem List Decreased activity tolerance;Decreased balance;Decreased mobility;Decreased knowledge of use of DME (Dizziness)  PT Treatment  Interventions DME instruction;Gait training;Functional mobility training;Therapeutic activities;Therapeutic exercise;Balance training;Patient/family education (Vestibular Rehab)   PT Goals (Current goals can be found in the Care Plan section) Acute Rehab PT Goals Patient Stated Goal: To get better PT Goal Formulation: With patient Time For Goal Achievement: 07/24/15 Potential to Achieve Goals: Good    Frequency Min 5X/week   Barriers to discharge        Co-evaluation               End of Session   Activity Tolerance: Treatment limited secondary to medical complications (Comment) (Limited by nausea,  diplopia) Patient left: in bed;with call bell/phone within reach Nurse Communication: Mobility status (Requests nausea meds)    Functional Assessment Tool Used: Clinical judgement Functional Limitation: Mobility: Walking and moving around Mobility: Walking and Moving Around Current Status [id number]): At least 20 percent but less than 40 percent impaired, limited or restricted Mobility: Walking and Moving Around Goal Status [phone]): At least 1 percent but less than 20 percent impaired, limited or restricted    Time: [id number] PT Time Calculation (min) (ACUTE ONLY): 27 min   Charges:   PT Evaluation $PT Eval Moderate Complexity: 1 Procedure PT Treatments $Canalith Rep Proc: 8-22 mins   PT G Codes:   PT G-Codes **NOT FOR INPATIENT CLASS** Functional Assessment Tool Used: Clinical judgement Functional Limitation: Mobility: Walking and moving around Mobility: Walking and Moving Around Current Status [id number]): At least 20 percent but less than 40 percent impaired, limited or restricted Mobility: Walking and Moving Around Goal Status [phone]): At least 1 percent but less than 20 percent impaired, limited or restricted    Despina Pole 07/17/2015, 9:04 PM Carita Pian. Sanjuana Kava, [place] Pager [phone]

## 2015-07-17 NOTE — Progress Notes (Signed)
Called to patient room for hand swelling.  He reports that he has hand discomfort and swelling, and indicates the thenar eminence and distal 2-4th digits.  These have no apparent swelling to me, nor redness, nor warmth.  Reassurance given.

## 2015-07-17 NOTE — Progress Notes (Addendum)
Per MD order patient attempted to ambulate in room. Patient slowly went from supine to sitting. A few minutes later we slowly transferred to edge of bed. Patient still had no complaints of dizziness or nausea. Patient slowly stood at edge of bed and remained asymptomatic.  Ambulated to doorway and back to bed and as patient sat back on bed patient began to dry heave. Asked patient if he was having dizziness while walking he stated "it just hit me" and when asked to clarify patient stated "nausea". Patient remained at edge of bed with nausea/dry heaves.   1215: PRN zofran given. Patient stated nausea 10/10 and after zofran nausea now 3/10.

## 2015-07-17 NOTE — Progress Notes (Signed)
Physical Therapy Progress Note Vestibular Rehab Assessment    07/17/15 2047  Vestibular Assessment  General Observation Patient reports sudden onset of vertigo while driving, including diplopia, diaphoresis.  Symptom Behavior  Type of Dizziness Spinning (Diplopia)  Frequency of Dizziness Ongoing  Duration of Dizziness Minutes  Aggravating Factors Spontaneous onset;Turning head quickly;Turning head sideways;Supine to sit (Closing eyes)  Relieving Factors Lying supine;Slow movements  Occulomotor Exam  Occulomotor Alignment Normal  Spontaneous Absent  Gaze-induced Absent  Smooth Pursuits Intact (Difficulty due to diplopia)  Comment Minimal testing due to significant nausea and dry heaves with head movement.  Positional Testing  Dix-Hallpike Dix-Hallpike Left  Horizontal Canal Testing Horizontal Canal Right;Horizontal Canal Left  Dix-Hallpike Left  Dix-Hallpike Left Duration No nystagmus  Dix-Hallpike Left Symptoms No nystagmus (Nausea)  Horizontal Canal Right  Horizontal Canal Right Symptoms Normal  Horizontal Canal Left  Horizontal Canal Left Symptoms Normal  Positional Sensitivities  Supine to Sitting 5  Up from Left Hallpike 5  Carita Pian. Sanjuana Kava, [place] Pager [phone]

## 2015-07-17 NOTE — Progress Notes (Signed)
Lactic acid triggered as BPA given WBC and tachycardia, result at 2.2 mmol/L.  Etiology not entirely clear.  Infection doubted.  Will obtain blood cultures, add on urine culture.  Additional fluids now, repeat lactic acid after, as well as CBC and BMP.  Will defer antibiotics unless cultures positive, new fever, or WBC rising.

## 2015-07-18 DIAGNOSIS — R1110 Vomiting, unspecified: Secondary | ICD-10-CM | POA: Diagnosis not present

## 2015-07-18 DIAGNOSIS — F319 Bipolar disorder, unspecified: Secondary | ICD-10-CM | POA: Diagnosis not present

## 2015-07-18 DIAGNOSIS — H532 Diplopia: Secondary | ICD-10-CM | POA: Diagnosis not present

## 2015-07-18 DIAGNOSIS — I10 Essential (primary) hypertension: Secondary | ICD-10-CM | POA: Diagnosis not present

## 2015-07-18 NOTE — Progress Notes (Signed)
Physical Therapy Treatment Patient Details Name: [name] [name] MRN: [id number] DOB: April 10, 1980 Today's Date: 07/18/2015    History of Present Illness Patient is a 35 yo male admitted 07/16/15 with vertigo, diplopia, N/V while driving.  Sudden onset, aggrivated by head movement and quick position changes.  Duration of nausea and dry heaving is minutes.  No nystagmus noted during session.   PMH:  Bipolar disorder, seizures    PT Comments    Patient continues to have nausea with movement today, but no diplopia.  Patient requested no vestibular testing today - does not want to feel sick for travel home.  Patient was able to ambulate 68' with no assistive device and good balance.  Focused on compensation techniques.  Patient has prescription for OP PT for Vestibular Rehab and reports he will go to [facility] for this - where his other doctors/neurologist is located.     Follow Up Recommendations  Outpatient PT;Supervision for mobility/OOB (for Vestibular Rehab)     Equipment Recommendations  None recommended by PT    Recommendations for Other Services       Precautions / Restrictions Precautions Precautions: Fall (Seizure precautions) Precaution Comments: Dizziness Restrictions Weight Bearing Restrictions: No    Mobility  Bed Mobility Overal bed mobility: Modified Independent             General bed mobility comments: Patient able to roll to side and move to sitting with increased time.  Keeps head midline.  Good sitting balance.   Transfers Overall transfer level: Modified independent Equipment used: None             General transfer comment: Verbal cues to use visual target to minimize dizziness.  Able to move to standing with increased time.  Ambulation/Gait Ambulation/Gait assistance: Supervision Ambulation Distance (Feet): 82 Feet Assistive device: None Gait Pattern/deviations: Step-through pattern;Decreased stride length Gait velocity: decreased Gait  velocity interpretation: Below normal speed for age/gender General Gait Details: Verbal cues to use visual target to minimize dizziness during gait.  Patient keeps head midline during gait and turns. Good balance during gait.  Symptoms 4/10.   Stairs            Wheelchair Mobility    Modified Rankin (Stroke Patients Only)       Balance                                    Cognition Arousal/Alertness: Awake/alert Behavior During Therapy: Anxious;Flat affect Overall Cognitive Status: Within Functional Limits for tasks assessed                      Exercises      General Comments General comments (skin integrity, edema, etc.): No vestibular testing today per patient request - to go home and does not want to be sick for travel home.      Pertinent Vitals/Pain Pain Assessment: No/denies pain    Home Living                      Prior Function            PT Goals (current goals can now be found in the care plan section) Acute Rehab PT Goals Patient Stated Goal: To get better Progress towards PT goals: Progressing toward goals    Frequency  Min 5X/week    PT Plan Current plan remains appropriate;Equipment recommendations need to be  updated    Co-evaluation             End of Session   Activity Tolerance: Patient tolerated treatment well Patient left: in bed;with call bell/phone within reach;with bed alarm set     Time: [id number] PT Time Calculation (min) (ACUTE ONLY): 10 min  Charges:  $Gait Training: 8-22 mins                    G Codes:  Functional Assessment Tool Used: Clinical judgement Functional Limitation: Mobility: Walking and moving around Mobility: Walking and Moving Around Goal Status [id number]): At least 1 percent but less than 20 percent impaired, limited or restricted Mobility: Walking and Moving Around Discharge Status [phone]): At least 1 percent but less than 20 percent impaired, limited or restricted    Despina Pole 07/18/2015, 2:07 PM

## 2015-07-18 NOTE — Discharge Summary (Signed)
Physician Discharge Summary  [name] [name] [id number] DOB: January 25, 1981 DOA: 07/16/2015  PCP: Gerrit Heck, MD  Admit date: 07/16/2015 Discharge date: 07/18/2015  Time spent: 35 minutes  Recommendations for Outpatient Follow-up:  1. Will need follow-up with subspecialty gastroenterology as well as surgery as an outpatient-my understanding is that the patient has umbilical/epigastric hernia repair scheduled 07/20/2015 and from my perspective the patient is hemodynamically stable to undergo this procedure but I will defer to his physicians at [facility] 2. Would recommend outpatient vestibular rehabilitation given occupational therapy input here revealed vestibular issues 3. -meclizine is a reasonable choice however I feel habituation exercises would benefit the patient more.  Discharge Diagnoses:  Principal Problem:   Intractable vomiting Active Problems:   BIPOLAR DISORDER UNSPECIFIED   Essential hypertension   Epilepsy ([place])   Behcet's disease ([place])   Peripheral vertigo   Vertigo   Diplopia   Discharge Condition: Improved  Diet recommendation: Bariatric/prior diet as per GI  Filed Weights   07/17/15 0201  Weight: 99.2 kg (218 lb 11.1 oz)    History of present illness:    35 y/o ? History of Behcets disease/epilepsy(On Lamictal)-followed by Dr. Sanda Klein neuro-ophthalmology [facility] GI Dr. Eduard Roux [facility] Saw the patient 06/10/2015 secondary to severe reflux? Globus--Also has 2 separate umbilical and epigastric hernias that need to be repaired by Dr. Florene Glen at [facility], Loralie Champagne, PA-] at [facility] to be on methotrexate, folic acid-->Started on aprimilast at most recent visit to rheumatology Status post laparoscopic Nissen fundoplication in [id number] Migraine disease Admission [id number] cyclical vomiting/hematemesis-->At that time had EGD showing esophageal and gastric irritation Bipolar  disorder Hypertension Admission 10/05/2004 seizure versus pseudoseizure  ED, he was tachycardic, afebrile, BP normal.  Na 142, K 4.1, Cr 1.1, LFT normal. WBC 13.7K, Hgb normal.  Ethanol negative.  Troponin normal.  UDS and UA both clear  Patient was given benzodiazepine, Zofran and neurology was consulted. He unfortunately spiked a lactic acid  Ultimately the patient was seen by neurology and an EEG was performed which did not show any specific concerns for seizure disorder or other findings Occupational therapy consulted on the patient and felt he had sign symptoms of vertigo which are peripheral and would benefit from habituation exercises On day of discharge patient was looking fair was tolerating diet although felt dizzy a little still  He was given a paper Rx for OP vestib rehab    Discharge Exam: Filed Vitals:   07/17/15 2216 07/18/15 0608  BP: 117/74 115/71  Pulse: 86 86  Temp: 99 F (37.2 C) 98.6 F (37 C)  Resp: 20 20    General: eomi ncat, no nystagmus Cardiovascular:  s1 s2 no m/r/g Respiratory: clear no added sound  Discharge Instructions   Discharge Instructions    Diet - low sodium heart healthy    Complete by:  As directed      Discharge instructions    Complete by:  As directed   Follow-up as OP with regular MD's Would recommend that you get outpatient referral for vestibular rehabilitation as this was probably a cause for your dizziness initially as per the therapist to saw you in the hospital Please continue your other medications without change Please follow-up with her gastroenterologist for your gastroenterology issues at [facility]     Increase activity slowly    Complete by:  As directed           Current Discharge Medication List    START taking these  medications   Details  meclizine (ANTIVERT) 25 MG tablet Take 1 tablet (25 mg total) by mouth 3 (three) times daily as needed for dizziness. Qty: 30 tablet, Refills: 0       CONTINUE these medications which have NOT CHANGED   Details  Apremilast (OTEZLA) 30 MG TABS Take 30 mg by mouth 2 (two) times daily.     atenolol (TENORMIN) 50 MG tablet Take 1 tablet (50 mg total) by mouth daily. Qty: 30 tablet, Refills: 0    clonazePAM (KLONOPIN) 0.5 MG tablet Take 1 tablet (0.5 mg total) by mouth 3 (three) times daily. For anxiety Qty: 90 tablet, Refills: 0    esomeprazole (NEXIUM) 40 MG capsule Take 40 mg by mouth 2 (two) times daily before a meal.    Eszopiclone 3 MG TABS Take 3 mg by mouth at bedtime. Lunesta    folic acid (FOLVITE) 1 MG tablet Take 1 mg by mouth daily.     hydrOXYzine (VISTARIL) 50 MG capsule Take 50 mg by mouth at bedtime.     lamoTRIgine (LAMICTAL) 200 MG tablet Take 1 tablet (200 mg total) by mouth 2 (two) times daily. Qty: 60 tablet, Refills: 5    Methotrexate Sodium (METHOTREXATE, PF,) 50 MG/2ML injection Inject 25 mg into the skin every Sunday.    QUEtiapine (SEROQUEL XR) 300 MG 24 hr tablet Take 300 mg by mouth at bedtime.       No Known Allergies Follow-up Information    Schedule an appointment as soon as possible for a visit with Gerrit Heck, MD.   Specialty:  Family Medicine   Contact information:   [place] [address] [phone]        The results of significant diagnostics from this hospitalization (including imaging, microbiology, ancillary and laboratory) are listed below for reference.    Significant Diagnostic Studies: Ct Head Wo Contrast  07/16/2015  CLINICAL DATA:  Altered mental status with questionable seizure. Altered vision EXAM: CT HEAD WITHOUT CONTRAST TECHNIQUE: Contiguous axial images were obtained from the base of the skull through the vertex without intravenous contrast. COMPARISON:  Head CT Aug 12, 2011 and brain MRI February 15, 2012 FINDINGS: The ventricles are normal in size and configuration. There is no intracranial mass, hemorrhage, extra-axial fluid  collection, or midline shift. Gray-white compartments are normal. No acute infarct evident. Bony calvarium appears intact. The mastoid air cells are clear. There is postoperative change in each maxillary antral region anteriorly. There is mild mucosal thickening in the left maxillary sinus region. No intraorbital lesions are identified. IMPRESSION: Anterior maxillary sinus postoperative change. Mild mucosal thickening left maxillary antrum. No intracranial mass, hemorrhage, or focal gray - white compartment lesions/ acute appearing infarct. Electronically Signed   By: Lowella Grip III M.D.   On: 07/16/2015 17:31   Mr Brain Wo Contrast  07/16/2015  CLINICAL DATA:  History of migraine, bipolar, hypertension, and Behcet's disease. History of seizures. Patient developed dizziness with double vision today. Patient now lethargic after Zofran. EXAM: MRI HEAD WITHOUT CONTRAST TECHNIQUE: Multiplanar, multiecho pulse sequences of the brain and surrounding structures were obtained without intravenous contrast. COMPARISON:  CT head earlier today.  MRI brain 02/15/2012. FINDINGS: No evidence for acute infarction, hemorrhage, mass lesion, hydrocephalus, or extra-axial fluid. Normal cerebral volume. No white matter disease. No findings to suggest acute or chronic infarction, vasculitis, or acute brain edema. Pituitary, pineal, and cerebellar tonsils unremarkable. No upper cervical lesions. Flow voids are maintained throughout the carotid, basilar, and vertebral  arteries. There are no areas of chronic hemorrhage. Thin-section imaging through the temporal lobes demonstrates no abnormality. Extracranial soft tissues unremarkable. Previous surgery for what was probably final facial fracture. No layering sinus fluid. IMPRESSION: Negative exam. No acute or focal intracranial abnormality. Specifically no evidence for infarction or white matter changes to suggest vasculitis. Electronically Signed   By: Staci Righter M.D.   On:  07/16/2015 20:32    Microbiology: No results found for this or any previous visit (from the past 240 hour(s)).   Labs: Basic Metabolic Panel:  Recent Labs Lab 07/16/15 1728 07/17/15 0609  NA 142 141  K 4.1 3.5  CL 108 108  CO2 24 21*  GLUCOSE 113* 104*  BUN 14 9  CREATININE 1.09 1.05  CALCIUM 8.8* 8.4*   Liver Function Tests:  Recent Labs Lab 07/16/15 1728 07/17/15 0609  AST 25 20  ALT 38 32  ALKPHOS 73 64  BILITOT 0.5 0.8  PROT 7.3 6.4*  ALBUMIN 4.1 3.6   No results for input(s): LIPASE, AMYLASE in the last 168 hours. No results for input(s): AMMONIA in the last 168 hours. CBC:  Recent Labs Lab 07/16/15 1728 07/17/15 0609  WBC 13.7* 7.9  NEUTROABS 12.0*  --   HGB 14.3 12.6*  HCT 44.4 38.3*  MCV 94.1 93.9  PLT 170 173   Cardiac Enzymes: No results for input(s): CKTOTAL, CKMB, CKMBINDEX, TROPONINI in the last 168 hours. BNP: BNP (last 3 results) No results for input(s): BNP in the last 8760 hours.  ProBNP (last 3 results) No results for input(s): PROBNP in the last 8760 hours.  CBG: No results for input(s): GLUCAP in the last 168 hours.     SignedNita Sells MD   [facility] 07/18/2015, 10:11 AM

## 2015-07-19 LAB — LAMOTRIGINE LEVEL: Lamotrigine Lvl: 15.5 ug/mL (ref 2.0–20.0)

## 2015-07-19 LAB — URINE CULTURE

## 2015-07-20 ENCOUNTER — Telehealth: Payer: Self-pay | Admitting: Adult Health

## 2015-07-20 LAB — CULTURE, BLOOD (ROUTINE X 2)

## 2015-07-20 NOTE — Telephone Encounter (Signed)
Lamictal level is normal. Please call the patient.

## 2015-07-20 NOTE — Telephone Encounter (Signed)
Called and spoke to patient relayed Lamictal level was normal. Patient understood .

## 2015-07-22 LAB — CULTURE, BLOOD (ROUTINE X 2): CULTURE: NO GROWTH

## 2015-07-24 ENCOUNTER — Encounter (HOSPITAL_COMMUNITY): Payer: Self-pay | Admitting: Family Medicine

## 2015-07-24 ENCOUNTER — Emergency Department (HOSPITAL_COMMUNITY)
Admission: EM | Admit: 2015-07-24 | Discharge: 2015-07-25 | Disposition: A | Discharge status: Home / Self Care | Payer: Medicare Other | Attending: Emergency Medicine | Admitting: Emergency Medicine

## 2015-07-24 DIAGNOSIS — E860 Dehydration: Secondary | ICD-10-CM | POA: Diagnosis not present

## 2015-07-24 DIAGNOSIS — F319 Bipolar disorder, unspecified: Secondary | ICD-10-CM | POA: Insufficient documentation

## 2015-07-24 DIAGNOSIS — R002 Palpitations: Secondary | ICD-10-CM | POA: Insufficient documentation

## 2015-07-24 DIAGNOSIS — H81391 Other peripheral vertigo, right ear: Secondary | ICD-10-CM | POA: Diagnosis not present

## 2015-07-24 DIAGNOSIS — Z79899 Other long term (current) drug therapy: Secondary | ICD-10-CM | POA: Insufficient documentation

## 2015-07-24 DIAGNOSIS — Z8739 Personal history of other diseases of the musculoskeletal system and connective tissue: Secondary | ICD-10-CM | POA: Diagnosis not present

## 2015-07-24 DIAGNOSIS — F419 Anxiety disorder, unspecified: Secondary | ICD-10-CM | POA: Diagnosis not present

## 2015-07-24 DIAGNOSIS — I10 Essential (primary) hypertension: Secondary | ICD-10-CM | POA: Diagnosis not present

## 2015-07-24 DIAGNOSIS — H532 Diplopia: Secondary | ICD-10-CM

## 2015-07-24 DIAGNOSIS — R112 Nausea with vomiting, unspecified: Secondary | ICD-10-CM | POA: Insufficient documentation

## 2015-07-24 DIAGNOSIS — R42 Dizziness and giddiness: Secondary | ICD-10-CM | POA: Diagnosis present

## 2015-07-24 LAB — CBC WITH DIFFERENTIAL/PLATELET
Basophils Absolute: 0 10*3/uL (ref 0.0–0.1)
Basophils Relative: 0 %
Eosinophils Absolute: 0 10*3/uL (ref 0.0–0.7)
Eosinophils Relative: 0 %
HEMATOCRIT: 51.4 % (ref 39.0–52.0)
Hemoglobin: 17.4 g/dL — ABNORMAL HIGH (ref 13.0–17.0)
Lymphocytes Relative: 17 %
Lymphs Abs: 1.9 10*3/uL (ref 0.7–4.0)
MCH: 31.2 pg (ref 26.0–34.0)
MCHC: 33.9 g/dL (ref 30.0–36.0)
MCV: 92.3 fL (ref 78.0–100.0)
MONO ABS: 0.6 10*3/uL (ref 0.1–1.0)
Monocytes Relative: 6 %
NEUTROS ABS: 8.6 10*3/uL — AB (ref 1.7–7.7)
NEUTROS PCT: 77 %
PLATELETS: 272 10*3/uL (ref 150–400)
RBC: 5.57 MIL/uL (ref 4.22–5.81)
RDW: 12.5 % (ref 11.5–15.5)
WBC: 11.2 10*3/uL — ABNORMAL HIGH (ref 4.0–10.5)

## 2015-07-24 LAB — COMPREHENSIVE METABOLIC PANEL
ALK PHOS: 86 U/L (ref 38–126)
ALT: 21 U/L (ref 17–63)
AST: 20 U/L (ref 15–41)
Albumin: 4.8 g/dL (ref 3.5–5.0)
Anion gap: 17 — ABNORMAL HIGH (ref 5–15)
BILIRUBIN TOTAL: 1 mg/dL (ref 0.3–1.2)
BUN: 14 mg/dL (ref 6–20)
CO2: 21 mmol/L — ABNORMAL LOW (ref 22–32)
Calcium: 10.8 mg/dL — ABNORMAL HIGH (ref 8.9–10.3)
Chloride: 100 mmol/L — ABNORMAL LOW (ref 101–111)
Creatinine, Ser: 1.13 mg/dL (ref 0.61–1.24)
GFR calc non Af Amer: >60 mL/min (ref >60)
GLUCOSE: 139 mg/dL — AB (ref 65–99)
Potassium: 3.9 mmol/L (ref 3.5–5.1)
Sodium: 138 mmol/L (ref 135–145)
TOTAL PROTEIN: 9.2 g/dL — AB (ref 6.5–8.1)

## 2015-07-24 MED ORDER — SODIUM CHLORIDE 0.9 % IV BOLUS (SEPSIS)
1000.0000 mL | Freq: Once | INTRAVENOUS | Status: AC
  Administered 2015-07-24: 1000 mL via INTRAVENOUS

## 2015-07-24 MED ORDER — MECLIZINE HCL 32 MG PO TABS: 32.0000 mg | ORAL_TABLET | Freq: Three times a day (TID) | ORAL | Status: DC | PRN

## 2015-07-24 MED ORDER — ONDANSETRON HCL 4 MG/2ML IJ SOLN
4.0000 mg | Freq: Once | INTRAMUSCULAR | Status: AC
  Administered 2015-07-24: 4 mg via INTRAVENOUS
  Filled 2015-07-24: qty 2

## 2015-07-24 MED ORDER — ONDANSETRON 4 MG PO TBDP: 4.0000 mg | ORAL_TABLET | Freq: Three times a day (TID) | ORAL | Status: DC | PRN

## 2015-07-24 MED ORDER — LORAZEPAM 2 MG/ML IJ SOLN
1.0000 mg | Freq: Once | INTRAMUSCULAR | Status: AC
  Administered 2015-07-24: 1 mg via INTRAVENOUS
  Filled 2015-07-24: qty 1

## 2015-07-24 NOTE — ED Notes (Signed)
patient placed on monitor and EKG performed; visitor at bedside

## 2015-07-24 NOTE — ED Notes (Signed)
Pt here for dizziness, vertigo, nausea. Recently admitted to the hospital for the same. sts started back yesterday.

## 2015-07-24 NOTE — Discharge Instructions (Signed)
Today we treated you with intravenous fluid replacement for dehydration as well as medications for your vertigo and nausea.  After discharge I recommend using Zofran for nausea and meclizine for vertigo and nausea up to every 8 hours as needed.  Usually causes of dizziness similar to yours will show improvement within 2 weeks but there are no very good treatments to speed up this process. If you complete this treatment course and almost 2 weeks have passed without substantial improvement you should see a physician to be checked again.

## 2015-07-24 NOTE — ED Provider Notes (Signed)
CSN: [id number]     Arrival date & time 07/24/15  1228 History   First MD Initiated Contact with Patient 07/24/15 1301     Chief Complaint  Patient presents with  . Dizziness    (Consider location/radiation/quality/duration/timing/severity/associated sxs/prior Treatment) Patient is a 36 y.o. male presenting with dizziness. The history is provided by the patient.  Dizziness Associated symptoms: nausea, palpitations, vomiting and weakness   Associated symptoms: no shortness of breath    35 y/o man with Hx of behcet's disease, seizures presents for vertigo with associated nausea that has been ongoing since his abdominal surgery at [facility] for hernia repairs on 4/18. However today his symptoms became too severe to walk around with retching and vomiting. Symptoms are worsened with movement and walking but continue at rest. He had these same symptoms at a presentation to the ED on 4/14 for which he was admitted overnight. Neurology evaluation at that time including head MRI was most consistent with acute labyrinthitis and improved with zofran, antivert, and short term benzos.  Past Medical History  Diagnosis Date  . Seizures ([place])     last experienced a seizure x 3 years ago  . Migraine   . Hypertension   . Bipolar disorder ([place])   . Vitamin D deficiency   . Behcet's disease [facility])    Past Surgical History  Procedure Laterality Date  . Appendectomy    . Maxilofacial    . Fracture surgery Right     Arm   Family History  Problem Relation Age of Onset  . Lung cancer Paternal Grandfather   . Other Father     BPPV   Social History  Substance Use Topics  . Smoking status: Never Smoker   . Smokeless tobacco: None  . Alcohol Use: No    Review of Systems  Constitutional: Negative for fever.  Eyes:       Diplopia  Respiratory: Negative for chest tightness and shortness of breath.   Cardiovascular: Positive for palpitations.  Gastrointestinal: Positive for nausea and  vomiting.  Endocrine: Negative for polydipsia.  Genitourinary: Negative for hematuria.  Skin: Negative for pallor.  Neurological: Positive for dizziness and weakness.  Psychiatric/Behavioral: The patient is nervous/anxious.     Allergies  Review of patient's allergies indicates no known allergies.  Home Medications   Prior to Admission medications   Medication Sig Start Date End Date Taking? Authorizing Provider  Apremilast (OTEZLA) 30 MG TABS Take 30 mg by mouth 2 (two) times daily.    Yes Historical Provider, MD  atenolol (TENORMIN) 50 MG tablet Take 1 tablet (50 mg total) by mouth daily. Patient taking differently: Take 50 mg by mouth 2 (two) times daily.  02/16/13  Yes Robbie Lis, MD  clonazePAM (KLONOPIN) 0.5 MG tablet Take 1 tablet (0.5 mg total) by mouth 3 (three) times daily. For anxiety 02/16/13  Yes Robbie Lis, MD  esomeprazole (NEXIUM) 40 MG capsule Take 40 mg by mouth 2 (two) times daily before a meal.   Yes Historical Provider, MD  Eszopiclone 3 MG TABS Take 3 mg by mouth at bedtime. Lunesta 07/24/13  Yes Historical Provider, MD  folic acid (FOLVITE) 1 MG tablet Take 1 mg by mouth daily.  04/07/13  Yes Historical Provider, MD  HYDROcodone-acetaminophen (NORCO/VICODIN) 5-325 MG tablet Take 1 tablet by mouth every 6 (six) hours as needed. 07/20/15  Yes Historical Provider, MD  hydrOXYzine (VISTARIL) 50 MG capsule Take 50 mg by mouth at bedtime.  05/03/13  Yes  Historical Provider, MD  lamoTRIgine (LAMICTAL) 200 MG tablet Take 1 tablet (200 mg total) by mouth 2 (two) times daily. 07/29/14  Yes Kathrynn Ducking, MD  Methotrexate Sodium (METHOTREXATE, PF,) 50 MG/2ML injection Inject 25 mg into the skin every Sunday.   Yes Historical Provider, MD  QUEtiapine (SEROQUEL XR) 300 MG 24 hr tablet Take 300 mg by mouth at bedtime. 07/05/15  Yes Historical Provider, MD  meclizine (ANTIVERT) 32 MG tablet Take 1 tablet (32 mg total) by mouth 3 (three) times daily as needed for dizziness or  nausea. 07/24/15   [name], MD  ondansetron (ZOFRAN ODT) 4 MG disintegrating tablet Take 1 tablet (4 mg total) by mouth every 8 (eight) hours as needed for nausea or vomiting. 07/24/15   [name], MD   BP 121/91 mmHg  Pulse 85  Temp(Src) 97.5 F (36.4 C) (Oral)  Resp 15  SpO2 99% Physical Exam GENERAL- alert, co-operative, NAD HEENT- Conjunctiva noninjected, PERRLA, horizontal end gaze nystagmus on left CARDIAC- RRR, no murmurs, rubs or gallops. RESP- CTAB, no wheezes or crackles. ABDOMEN- Soft, nontender, well healing laparoscopy incisions NEURO- Generalized weakness or low effort on all extremities EXTREMITIES- symmetric, no pedal edema. SKIN- Warm, dry, pale PSYCH- anxious   ED Course  Procedures (including critical care time) Labs Review Labs Reviewed  COMPREHENSIVE METABOLIC PANEL - Abnormal; Notable for the following:    Chloride 100 (*)    CO2 21 (*)    Glucose, Bld 139 (*)    Calcium 10.8 (*)    Total Protein 9.2 (*)    Anion gap 17 (*)    All other components within normal limits  CBC WITH DIFFERENTIAL/PLATELET - Abnormal; Notable for the following:    WBC 11.2 (*)    Hemoglobin 17.4 (*)    Neutro Abs 8.6 (*)    All other components within normal limits    Imaging Review No results found. I have personally reviewed and evaluated these images and lab results as part of my medical decision-making.   EKG Interpretation   Date/Time:  Saturday July 24 2015 14:11:12 EDT Ventricular Rate:  83 PR Interval:  149 QRS Duration: 104 QT Interval:  394 QTC Calculation: 463 R Axis:   61 Text Interpretation:  Sinus rhythm Nonspecific T wave abnormality No  significant change since last tracing Confirmed by Ashok Cordia  MD, Lennette Bihari  ([id number]) on 07/24/2015 2:18:20 PM Also confirmed by Ashok Cordia  MD, Lennette Bihari  ([id number]), editor Rolla Plate, Joelene Millin [phone])  on 07/24/2015 3:01:07 PM      MDM   Final diagnoses:  Peripheral vertigo, right  Diplopia  Dehydration    Vertigo and nausea are consistent with his previous evaluation on 4/14 with the same symptoms. At that time neurology consultation and head MRI were obtained with recommendation for vestibular rehab. However he did not make it to this partially due to an abdominal surgery on the 18th at [facility] and symptoms have become severe. Labs are suggestive of possibly hypovolemia with mild elevations in multiple readings of CBC and Bmet. His symptoms improved greatly while in the ED with IV zofran and ativan and was ambulatory with minimal assistance. Disposition is to return home with his mother as partial supervision and prescription for antivert and zofran will be provided, ideally as a bridge to vestibular rehab.   [name], MD 07/24/15 [place], MD 07/25/15 [phone]

## 2015-08-03 ENCOUNTER — Ambulatory Visit: Payer: Medicare Other | Attending: Family Medicine | Admitting: Rehabilitative and Restorative Service Providers"

## 2015-08-03 DIAGNOSIS — R2689 Other abnormalities of gait and mobility: Secondary | ICD-10-CM

## 2015-08-03 DIAGNOSIS — R42 Dizziness and giddiness: Secondary | ICD-10-CM | POA: Insufficient documentation

## 2015-08-03 NOTE — Patient Instructions (Signed)
Tip Card 1.The goal of habituation training is to assist in decreasing symptoms of vertigo, dizziness, or nausea provoked by specific head and body motions. 2.These exercises may initially increase symptoms; however, be persistent and work through symptoms. With repetition and time, the exercises will assist in reducing or eliminating symptoms. 3.Exercises should be stopped and discussed with the therapist if you experience any of the following: - Sudden change or fluctuation in hearing - New onset of ringing in the ears, or increase in current intensity - Any fluid discharge from the ear - Severe pain in neck or back - Extreme nausea  Copyright  VHI. All rights reserved.  Head Motion: Side to Side    Sitting, tilt head down slightly, slowly move head to right with eyes open, then left.  Move slowly to begin. Repeat _5___ times per session. Do _2-3___ sessions per day.  Copyright  VHI. All rights reserved.    Gaze Stabilization: Tip Card 1.Target must remain in focus, not blurry, and appear stationary while head is in motion. 2.Perform exercises with small head movements (45 to either side of midline). 3.Increase speed of head motion so long as target is in focus. 4.If you wear eyeglasses, be sure you can see target through lens (therapist will give specific instructions for bifocal / progressive lenses). 5.These exercises may provoke dizziness or nausea. Work through these symptoms. If too dizzy, slow head movement slightly. Rest between each exercise. 6.Exercises demand concentration; avoid distractions. 7.For safety, perform standing exercises close to a counter, wall, corner, or next to someone.  Copyright  VHI. All rights reserved.  Gaze Stabilization: Standing Feet Apart   Feet shoulder width apart, keeping eyes on target on wall 3 feet away, tilt head down slightly and move head side to side for 30 seconds. Repeat while moving head up and down for 30 seconds. Do 2 sessions  per day.   Copyright  VHI. All rights reserved.   Walk to your tolerance trying to improve tolerance to movement.

## 2015-08-03 NOTE — Therapy (Signed)
[facility] [address] [place], [place], [address] Phone: [phone]   Fax:  [phone]  Physical Therapy Evaluation  Patient Details  Name: [name] [name] MRN: [id number] Date of Birth: [birth year] Referring Provider: Leighton Ruff, MD  Encounter Date: 08/03/2015      PT End of Session - 08/03/15 1006    Visit Number 1   Number of Visits 10   Date for PT Re-Evaluation 10/02/15   Authorization Type G code every 10th visit.   PT Start Time [phone]   PT Stop Time 0930   PT Time Calculation (min) 40 min   Activity Tolerance Patient tolerated treatment well   Behavior During Therapy [facility] for tasks assessed/performed      Past Medical History  Diagnosis Date  . Seizures ([place])     last experienced a seizure x 3 years ago  . Migraine   . Hypertension   . Bipolar disorder ([place])   . Vitamin D deficiency   . Behcet's disease [facility])     Past Surgical History  Procedure Laterality Date  . Appendectomy    . Maxilofacial    . Fracture surgery Right     Arm    There were no vitals filed for this visit.       Subjective Assessment - 08/03/15 0855    Subjective The patient reports that he had sudden onset of vertigo while driving and was seeing "triple of everything" and he pulled over and tried to get up out of the car and couldn't walk.  He had n/v and was hospitalized for 3 days.  He reports symptoms improved while  hospiitalized movements improved unless PT was assessing him, which provoked vomitting.  He reports dizziness is worse with movement and turns head or bends over.  Moving quickly in bed also provokes.   Pertinent History seizures   Patient Stated Goals Try to get my movement to where I'm not dizzy.  Stop the sickness/vomiting.   Currently in Pain? No/denies            [facility] PT Assessment - 08/03/15 0902    Assessment   Medical Diagnosis vertigo   Referring Provider Leighton Ruff, MD   Onset  Date/Surgical Date 07/16/15   Prior Therapy acute care PT   Precautions   Precautions Fall   Precaution Comments Patient also had recent hernia surgery 07/20/2015.  Unsure of precautions/ PT to proceed with caution   Restrictions   Weight Bearing Restrictions No   Balance Screen   Has the patient fallen in the past 6 months Yes   How many times? 1  at onset   Has the patient had a decrease in activity level because of a fear of falling?  Yes   Is the patient reluctant to leave their home because of a fear of falling?  No   Home Environment   Living Environment Private residence   Living Arrangements Parent   Type of [place] to enter   Entrance Stairs-Number of Steps 6   Entrance Stairs-Rails Can reach both   [place] One level   Additional Comments Morning time is worse with movement, may need help with activities in morning.   Prior Function   Level of Independence Independent with basic ADLs  Bechette's disease/ ulcers and sores   Vocation On disability   Ambulation/Gait   Ambulation/Gait Yes   Ambulation/Gait Assistance 7: Independent   Ambulation Distance (Feet) 200  Feet   Assistive device None   Ambulation Surface Level   Gait velocity 1.21 ft/sec indiating significantly impaired gait velocity   Gait Comments Significantly slowed pace moving en block with dec'd arm swing and head motion.   Balance   Balance Assessed Yes   Static Standing Balance   Static Standing Balance -  Activities  Romberg - Eyes Opened;Romberg - Eyes Closed;Sharpened Romberg - Eyes Open;Sharpened Romberg - Eyes Closed;Single Leg Stance - Right Leg;Single Leg Stance - Left Leg  SLS 8 seconds each leg   Static Standing - Comment/# of Minutes Romberg EO and EC are 30 seconds each with increased sway with EC.  Sharpened Romberg 3 seconds with L leg posterior/8 seconds with R leg posterior   Dynamic Standing Balance   Dynamic Standing - Comments Avoids head motion during walking  tasks.            Vestibular Assessment - 08/03/15 0900    Vestibular Assessment   General Observation patient ambualtes into clinic independently.  0/10 dizziness at baseline.     Symptom Behavior   Type of Dizziness Spinning  "head and eyes have to catch up"   Frequency of Dizziness ongoing, daily   Duration of Dizziness seconds to minutes   Aggravating Factors Activity in general;Turning head quickly;Turning body quickly   Relieving Factors Head stationary;Lying supine   Occulomotor Exam   Occulomotor Alignment Abnormal  R eye minimally abducted in position   Spontaneous Absent   Gaze-induced Absent   Smooth Pursuits Intact   Saccades Intact  horizontal, vertical mvmt has skew to left with 2 jumps   Vestibulo-Occular Reflex   VOR 1 Head Only (x 1 viewing) 2 reps of VOR at slow pace is 2/10 dizziness.   Comment Head impulse test - patient gets 3/10 symptoms and cannot maintain gaze at target to either direction.   Positional Sensitivities   Supine to Left Side No dizziness   Supine to Right Side Mild dizziness  1/5 baseline, 2/5 with movement   Head Turning x 5 Moderate dizziness   Head Nodding x 5 Lightheadedness                Vestibular Treatment/Exercise - 08/03/15 0919    Vestibular Treatment/Exercise   Vestibular Treatment Provided Gaze;Habituation   Habituation Exercises Seated Horizontal Head Turns   Gaze Exercises X1 Viewing Horizontal;X1 Viewing Vertical   Seated Horizontal Head Turns   Number of Reps  5   Symptom Description  provokes 3/10 symptoms   X1 Viewing Horizontal   Foot Position seated   Time --  3 repetitions x 2 times   Comments Patient with dec'd tolerance to horizontal gaze adaptation.  PT provided slow pace beginning with 3 reps and working up to 10 repetitions as able.   X1 Viewing Vertical   Foot Position seated   Comments Patient tolerates 5 reps with 2/10 symptoms.  With faster pace, he notes visual blurring.                 PT Education - 08/03/15 1006    Education provided Yes   Education Details HEP: gaze x 1 horizontal and vertical, seated horizontal head turns for habituation   Person(s) Educated Patient   Methods Explanation;Demonstration;Handout   Comprehension Verbalized understanding;Returned demonstration          PT Short Term Goals - 08/03/15 1007    PT SHORT TERM GOAL #1   Title The patient will return demo HEP for habituation,  gaze, balance and general mobility.   Baseline Target date:  09/02/2015   Time 4   Period Weeks   PT SHORT TERM GOAL #2   Title The patient will tolerate seated head turns x 5 reps at self selected pace with dizziness 1/0.   Baseline Target date:  09/02/2015   Time 4   Period Weeks   PT SHORT TERM GOAL #3   Title The patient will tolerate gaze x 1 adaptation x 30 seconds in horizontal plane at self selected pace for improved tolerance to movement.   Baseline Target date:  09/02/2015   Time 4   Period Weeks   PT SHORT TERM GOAL #4   Title The patient will be further assessed on DGI, SOT as indicated and LTGs to follow.   Baseline Target date:  09/02/2015   Time 4   Period Weeks           PT Long Term Goals - 08/03/15 1010    PT LONG TERM GOAL #1   Title The patient will reduce dizziness handicap inventory from 74% to < or equal to 50% for improved self perception of dizziness.   Baseline Target date 10/03/2015   Time 8   Period Weeks   PT LONG TERM GOAL #2   Title The patient will tolerate gaze x 1 viewing x 60 seconds at self selected pace with dizziness 1/10 or less.   Baseline Target date 10/03/2015   Time 8   Period Weeks   PT LONG TERM GOAL #3   Title The patient will return demo progression of HEP for post d/c.   Baseline Target date 10/03/2015   Time 8   Period Weeks   PT LONG TERM GOAL #4   Title DGI goal to follow, as indicated   Baseline Target date 10/03/2015   Time 8   Period Weeks   PT LONG TERM GOAL #5   Title SOT goal to  follow, as indicated.    Baseline Target date 10/03/2015   Time 8   Period Weeks   Additional Long Term Goals   Additional Long Term Goals Yes   PT LONG TERM GOAL #6   Title Improve gait speed from 1.21 ft/sec to > or equal to 2.6 ft/sec to demo return to "full community ambulator" classification of gait.   Baseline Target date 10/03/2015   Time 8   Period Weeks               Plan - 08/03/15 1015    Clinical Impression Statement The patient is a 35 yo male with recent sudden onset of vertigo 07/16/2015 with hospital admission through 07/18/2015.  The patient has motion sensitivity, decreased VOR, and overall diminished mobility due to recent onset of vertigo.  PT initiated HEP at today's visit for habituation and recommended walking program to increase current activity level.      Rehab Potential Good   PT Frequency 2x / week   PT Duration 4 weeks  followed by 1x/week for 4 weeks   PT Treatment/Interventions ADLs/Self Care Home Management;Vestibular;Manual techniques;Therapeutic exercise;Therapeutic activities;Functional mobility training;Neuromuscular re-education;Patient/family education;Gait training   PT Next Visit Plan Check HEP, add balance + habituation exercises, dynamic gait activities   Consulted and Agree with Plan of Care Patient      Patient will benefit from skilled therapeutic intervention in order to improve the following deficits and impairments:  Abnormal gait, Decreased balance, Dizziness, Decreased mobility  Visit Diagnosis: Dizziness and giddiness  Other  abnormalities of gait and mobility      G-Codes - Aug 15, 2015 2050    Functional Assessment Tool Used Dizziness handicap index=74%.  Perform VOR x 1 viewing x 5 reps with difficulty and dizziness.   Functional Limitation Mobility: Walking and moving around   Mobility: Walking and Moving Around Current Status [phone]) At least 40 percent but less than 60 percent impaired, limited or restricted   Mobility:  Walking and Moving Around Goal Status [phone]) At least 20 percent but less than 40 percent impaired, limited or restricted       Problem List Patient Active Problem List   Diagnosis Date Noted  . Peripheral vertigo 07/17/2015  . Vertigo 07/17/2015  . Intractable vomiting 07/17/2015  . Diplopia   . Cellulitis and abscess of trunk-right chest wall 05/08/2013  . Dehydration 02/15/2013  . Behcet's disease ([place]) 02/15/2013  . Recurrent vomiting 02/15/2013  . Encounter for therapeutic drug monitoring 06/14/2012  . History of oral aphthous ulcers 10/19/2010  . BIPOLAR DISORDER UNSPECIFIED 06/18/2008  . Essential hypertension 06/18/2008  . SYNCOPE AND COLLAPSE 06/18/2008  . Epilepsy ([place]) 06/18/2008    [place], [place] 08/15/2015, 8:52 PM  [facility] [address] [place], [place], [address] Phone: [phone]   Fax:  [phone]  Name: [name] [name] MRN: [id number] Date of Birth: Jan 28, 1981

## 2015-08-10 ENCOUNTER — Ambulatory Visit: Payer: Medicare Other | Admitting: Physical Therapy

## 2015-08-10 DIAGNOSIS — R42 Dizziness and giddiness: Secondary | ICD-10-CM | POA: Diagnosis not present

## 2015-08-10 DIAGNOSIS — R2689 Other abnormalities of gait and mobility: Secondary | ICD-10-CM

## 2015-08-10 NOTE — Patient Instructions (Signed)
Gaze Stabilization: Standing Feet Together (Compliant Surface)    Feet together on pillow, keeping eyes on target on wall __3-5__ feet away, tilt head down 15-30 and move head side to side for __30-60__ seconds. Repeat while moving head up and down for __30-60__ seconds. Do _2-3___ sessions per day.  Copyright  VHI. All rights reserved.    Gaze Stabilization: Walking Toward Target    Keeping eyes on target, walk with target in your hand, moving head up and down for __20-30__ feet. Repeat with head tilted down 15-30, moving head side to side for __20-30__ feet. Do _2-3___ sessions per day.  Copyright  VHI. All rights reserved.   Side to Side Head Motion    Perform without assistive device. Walking on solid surface, turn head and eyes to left for __2__ steps. Then, turn head and eyes straight ahead for _2___ steps. Then, turn head and eyes to opposite side for _2___ steps.  Walk 20-30 feet. Repeat sequence _2___ times per session. Do __2-3__ sessions per day.   Copyright  VHI. All rights reserved.   Up / Down Head Motion    Perform without assistive device. Walking on solid surface, move head and eyes toward ceiling for _2___ steps. Then, move head and eyes straight ahead for __2__ steps. Then, move head and eyes toward floor for __2__ steps. Walk 20-30 feet. Repeat __2__ times per session. Do _2-3___ sessions per day.   Copyright  VHI. All rights reserved.

## 2015-08-10 NOTE — Therapy (Signed)
[facility] [address] [place], [place], [address] Phone: [phone]   Fax:  [phone]  Physical Therapy Treatment  Patient Details  Name: [name] [name] MRN: [id number] Date of Birth: 03-10-1981 Referring Provider: Leighton Ruff, MD  Encounter Date: 08/10/2015      PT End of Session - 08/10/15 1626    Visit Number 2   Number of Visits 10   Date for PT Re-Evaluation 10/02/15   Authorization Type G code every 10th visit.   PT Start Time 1542   PT Stop Time 1621   PT Time Calculation (min) 39 min   Activity Tolerance Patient tolerated treatment well   Behavior During Therapy WFL for tasks assessed/performed      Past Medical History  Diagnosis Date  . Seizures ([place])     last experienced a seizure x 3 years ago  . Migraine   . Hypertension   . Bipolar disorder ([place])   . Vitamin D deficiency   . Behcet's disease [facility])     Past Surgical History  Procedure Laterality Date  . Appendectomy    . Maxilofacial    . Fracture surgery Right     Arm    There were no vitals filed for this visit.      Subjective Assessment - 08/10/15 1545    Subjective dizziness is "getting a little better."  reports it's easier to move head around.  concerned it could be combination with other medical diagnoses.    Pertinent History seizures   Patient Stated Goals Try to get my movement to where I'm not dizzy.  Stop the sickness/vomiting.   Currently in Pain? Yes   Pain Score 4    Pain Location Generalized   Pain Orientation Left;Right   Aggravating Factors  c/o pain in "joints"                          Vestibular Treatment/Exercise - 08/10/15 1552    Vestibular Treatment/Exercise   Vestibular Treatment Provided Habituation;Gaze   Habituation Exercises Seated Horizontal Head Turns   Gaze Exercises X1 Viewing Horizontal;X1 Viewing Vertical;X2 Viewing Horizontal;X2 Viewing Vertical   Seated Horizontal  Head Turns   Number of Reps  5   Symptom Description  able to tolerate mildly increased speed; then VOR cancellation x10 horizontal/vertical   X1 Viewing Horizontal   Foot Position feet together on level and compliant   Time --  30 sec x 2   Reps 2   Comments cues to increase head motion as tolerated; symptoms 2/10 after   X1 Viewing Vertical   Foot Position feet together on level and compliant surface   Time --  30 sec x 2   Reps 2   Comments no significant increase in symptoms   X2 Viewing Horizontal   Foot Position seated   Time --  x10 times each way   Reps 2            Balance Exercises - 08/10/15 1616    Balance Exercises: Standing   Gait with Head Turns Forward  with ball toss and with gaze x 1 horizontal/vertical           PT Education - 08/10/15 1625    Education provided Yes   Education Details progressed HEP to standing gaze; and gait with head turns   Person(s) Educated Patient   Methods Explanation;Demonstration;Handout   Comprehension Verbalized understanding;Returned demonstration;Need further instruction  PT Short Term Goals - 08/03/15 1007    PT SHORT TERM GOAL #1   Title The patient will return demo HEP for habituation, gaze, balance and general mobility.   Baseline Target date:  09/02/2015   Time 4   Period Weeks   PT SHORT TERM GOAL #2   Title The patient will tolerate seated head turns x 5 reps at self selected pace with dizziness 1/0.   Baseline Target date:  09/02/2015   Time 4   Period Weeks   PT SHORT TERM GOAL #3   Title The patient will tolerate gaze x 1 adaptation x 30 seconds in horizontal plane at self selected pace for improved tolerance to movement.   Baseline Target date:  09/02/2015   Time 4   Period Weeks   PT SHORT TERM GOAL #4   Title The patient will be further assessed on DGI, SOT as indicated and LTGs to follow.   Baseline Target date:  09/02/2015   Time 4   Period Weeks           PT Long Term Goals -  08/03/15 1010    PT LONG TERM GOAL #1   Title The patient will reduce dizziness handicap inventory from 74% to < or equal to 50% for improved self perception of dizziness.   Baseline Target date 10/03/2015   Time 8   Period Weeks   PT LONG TERM GOAL #2   Title The patient will tolerate gaze x 1 viewing x 60 seconds at self selected pace with dizziness 1/10 or less.   Baseline Target date 10/03/2015   Time 8   Period Weeks   PT LONG TERM GOAL #3   Title The patient will return demo progression of HEP for post d/c.   Baseline Target date 10/03/2015   Time 8   Period Weeks   PT LONG TERM GOAL #4   Title DGI goal to follow, as indicated   Baseline Target date 10/03/2015   Time 8   Period Weeks   PT LONG TERM GOAL #5   Title SOT goal to follow, as indicated.    Baseline Target date 10/03/2015   Time 8   Period Weeks   Additional Long Term Goals   Additional Long Term Goals Yes   PT LONG TERM GOAL #6   Title Improve gait speed from 1.21 ft/sec to > or equal to 2.6 ft/sec to demo return to "full community ambulator" classification of gait.   Baseline Target date 10/03/2015   Time 8   Period Weeks               Plan - 08/10/15 1626    Clinical Impression Statement Pt tolerated increased activity today without significant increase in symptoms.  Reports he feels "85% better."  Progressed exercises today.  Needs cues to increase walking speed as pt continues to demonstrate cautious gait.   PT Next Visit Plan add balance + habituation exercises, dynamic gait activities   Consulted and Agree with Plan of Care Patient      Patient will benefit from skilled therapeutic intervention in order to improve the following deficits and impairments:     Visit Diagnosis: Dizziness and giddiness  Other abnormalities of gait and mobility     Problem List Patient Active Problem List   Diagnosis Date Noted  . Peripheral vertigo 07/17/2015  . Vertigo 07/17/2015  . Intractable vomiting  07/17/2015  . Diplopia   . Cellulitis and abscess of trunk-right  chest wall 05/08/2013  . Dehydration 02/15/2013  . Behcet's disease ([place]) 02/15/2013  . Recurrent vomiting 02/15/2013  . Encounter for therapeutic drug monitoring 06/14/2012  . History of oral aphthous ulcers 10/19/2010  . BIPOLAR DISORDER UNSPECIFIED 06/18/2008  . Essential hypertension 06/18/2008  . SYNCOPE AND COLLAPSE 06/18/2008  . Epilepsy ([place]) 06/18/2008   Laureen Abrahams, PT, DPT 08/10/2015 4:28 PM  [facility] [address] [place], [place], [address] Phone: [phone]   Fax:  [phone]  Name: JAYZION JANKIEWICZ MRN: [id number] Date of Birth: 1981/01/12

## 2015-08-13 ENCOUNTER — Ambulatory Visit: Payer: Medicare Other | Admitting: Rehabilitative and Restorative Service Providers"

## 2015-08-13 DIAGNOSIS — R42 Dizziness and giddiness: Secondary | ICD-10-CM

## 2015-08-13 DIAGNOSIS — R2689 Other abnormalities of gait and mobility: Secondary | ICD-10-CM

## 2015-08-13 NOTE — Therapy (Signed)
[facility] [address] [place], [place], [address] Phone: [phone]   Fax:  [phone]  Physical Therapy Treatment  Patient Details  Name: [name] [name] MRN: [id number] Date of Birth: 1981-03-31 Referring Provider: Leighton Ruff, MD  Encounter Date: 08/13/2015      PT End of Session - 08/13/15 1437    Visit Number 3   Number of Visits 10   Date for PT Re-Evaluation 10/02/15   Authorization Type G code every 10th visit.   PT Start Time 1407   PT Stop Time 1445   PT Time Calculation (min) 38 min   Activity Tolerance Patient tolerated treatment well   Behavior During Therapy WFL for tasks assessed/performed      Past Medical History  Diagnosis Date  . Seizures ([place])     last experienced a seizure x 3 years ago  . Migraine   . Hypertension   . Bipolar disorder ([place])   . Vitamin D deficiency   . Behcet's disease [facility])     Past Surgical History  Procedure Laterality Date  . Appendectomy    . Maxilofacial    . Fracture surgery Right     Arm    There were no vitals filed for this visit.      Subjective Assessment - 08/13/15 1408    Subjective The patient reports that he has ear fullness on the right side.  He reports it is "annoying" sensation.  He notes that head motion still provokes dizziness.    The patient reports that vision splits when sitting still not moving her head.  "I'm just a tic off".    Pertinent History seizures   Patient Stated Goals Try to get my movement to where I'm not dizzy.  Stop the sickness/vomiting.   Currently in Pain? Yes  patient has skin sores from autoimmune condition   Pain Location Ear  gums    Pain Orientation Right   Pain Descriptors / Indicators Aching   Pain Type Chronic pain   Pain Onset More than a month ago   Pain Frequency Constant   Aggravating Factors  generalized pain in joints           [facility] Adult PT Treatment/Exercise - 08/13/15 1439    Ambulation/Gait   Ambulation/Gait Yes   Gait Comments Dynamic gait with horiozntal head turns emphasizing increasing speed of movement.          Vestibular Treatment/Exercise - 08/13/15 1414    Vestibular Treatment/Exercise   Vestibular Treatment Provided Habituation;Gaze   Habituation Exercises Standing Horizontal Head Turns;Standing Vertical Head Turns   Gaze Exercises X1 Viewing Horizontal;X1 Viewing Vertical   Standing Horizontal Head Turns   Number of Reps  5  then 10 reps   Symptom Description  Increases symptoms in mild range.  Performed standing on foam with feet apart   Standing Vertical Head Turns   Number of Reps  5   Symptom Description  On foam with eyes open in corner- mild increase in symptoms.   X1 Viewing Horizontal   Foot Position feet apart   Reps 2   Comments 30 seconds x 2 sets with increased amplitude and speed of movement with verbal cues.   X1 Viewing Vertical   Foot Position feet apart   Reps 1   Comments 30 seconds.  *Patient c/o vision "splitting" into multiple parts when doing gaze exercise.   X2 Viewing Horizontal    Comments worked on visual eyes/head motion for  visual challenge      SELF CARE/HOME MANAGEMENT: Discussed increasing gym routine and walking distances to tolerance for improved mobility.  Discussed f/u with MD re: ear fullness if it does not resolve.            PT Short Term Goals - 08/13/15 1424    PT SHORT TERM GOAL #1   Title The patient will return demo HEP for habituation, gaze, balance and general mobility.   Baseline Target date:  09/02/2015   Time 4   Period Weeks   Status On-going   PT SHORT TERM GOAL #2   Title The patient will tolerate seated head turns x 5 reps at self selected pace with dizziness 1/0.   Baseline Target date:  09/02/2015 (One 08/13/2015 "it takes me a minute to catch up")   Time 4   Period Weeks   Status On-going   PT SHORT TERM GOAL #3   Title The patient will tolerate gaze x 1 adaptation x 30  seconds in horizontal plane at self selected pace for improved tolerance to movement.   Baseline Met on 08/13/2015   Time 4   Period Weeks   Status Achieved   PT SHORT TERM GOAL #4   Title The patient will be further assessed on DGI, SOT as indicated and LTGs to follow.   Baseline Target date:  09/02/2015   Time 4   Period Weeks   Status On-going           PT Long Term Goals - 08/03/15 1010    PT LONG TERM GOAL #1   Title The patient will reduce dizziness handicap inventory from 74% to < or equal to 50% for improved self perception of dizziness.   Baseline Target date 10/03/2015   Time 8   Period Weeks   PT LONG TERM GOAL #2   Title The patient will tolerate gaze x 1 viewing x 60 seconds at self selected pace with dizziness 1/10 or less.   Baseline Target date 10/03/2015   Time 8   Period Weeks   PT LONG TERM GOAL #3   Title The patient will return demo progression of HEP for post d/c.   Baseline Target date 10/03/2015   Time 8   Period Weeks   PT LONG TERM GOAL #4   Title DGI goal to follow, as indicated   Baseline Target date 10/03/2015   Time 8   Period Weeks   PT LONG TERM GOAL #5   Title SOT goal to follow, as indicated.    Baseline Target date 10/03/2015   Time 8   Period Weeks   Additional Long Term Goals   Additional Long Term Goals Yes   PT LONG TERM GOAL #6   Title Improve gait speed from 1.21 ft/sec to > or equal to 2.6 ft/sec to demo return to "full community ambulator" classification of gait.   Baseline Target date 10/03/2015   Time 8   Period Weeks               Plan - 08/13/15 1438    Clinical Impression Statement The patient tolerated progression of home exercises well.  PT began to approach return to community wellness at gym for post d/c planning.  Continue to STGs and LTGs- Met 1 STG.   PT Treatment/Interventions ADLs/Self Care Home Management;Vestibular;Manual techniques;Therapeutic exercise;Therapeutic activities;Functional mobility  training;Neuromuscular re-education;Patient/family education;Gait training   PT Next Visit Plan add balance + habituation exercises, dynamic gait activities  Consulted and Agree with Plan of Care Patient      Patient will benefit from skilled therapeutic intervention in order to improve the following deficits and impairments:  Abnormal gait, Decreased balance, Dizziness, Decreased mobility  Visit Diagnosis: Dizziness and giddiness  Other abnormalities of gait and mobility     Problem List Patient Active Problem List   Diagnosis Date Noted  . Peripheral vertigo 07/17/2015  . Vertigo 07/17/2015  . Intractable vomiting 07/17/2015  . Diplopia   . Cellulitis and abscess of trunk-right chest wall 05/08/2013  . Dehydration 02/15/2013  . Behcet's disease ([place]) 02/15/2013  . Recurrent vomiting 02/15/2013  . Encounter for therapeutic drug monitoring 06/14/2012  . History of oral aphthous ulcers 10/19/2010  . BIPOLAR DISORDER UNSPECIFIED 06/18/2008  . Essential hypertension 06/18/2008  . SYNCOPE AND COLLAPSE 06/18/2008  . Epilepsy ([place]) 06/18/2008    [place], PT 08/13/2015, 3:51 PM  [facility] [address] [place], [place], [address] Phone: [phone]   Fax:  [phone]  Name: [name] [name] MRN: [id number] Date of Birth: December 16, 1980

## 2015-08-13 NOTE — Patient Instructions (Signed)
Gaze Stabilization: Tip Card 1.Target must remain in focus, not blurry, and appear stationary while head is in motion. 2.Perform exercises with small head movements (45 to either side of midline). 3.Increase speed of head motion so long as target is in focus. 4.If you wear eyeglasses, be sure you can see target through lens (therapist will give specific instructions for bifocal / progressive lenses). 5.These exercises may provoke dizziness or nausea. Work through these symptoms. If too dizzy, slow head movement slightly. Rest between each exercise. 6.Exercises demand concentration; avoid distractions. 7.For safety, perform standing exercises close to a counter, wall, corner, or next to someone.  Copyright  VHI. All rights reserved.  Gaze Stabilization: Standing Feet Apart   Feet shoulder width apart, keeping eyes on target on wall 3 feet away, tilt head down slightly and move head side to side for 30 seconds. Repeat while moving head up and down for 30 seconds. Do 2 sessions per day.   Copyright  VHI. All rights reserved.   Side to Side Head Motion    Perform without assistive device. Walking on solid surface, turn head and eyes to left for __2-3_ steps. Then, turn head and eyes to opposite side for __2-3__ steps. Repeat sequence __10-20__ times per session. Do _1-2___ sessions per day. Repeat while at mall or grocery store.   Copyright  VHI. All rights reserved.  Feet Apart (Compliant Surface) Head Motion - Eyes Open    With eyes open, standing on compliant surface: _pillow_______, feet shoulder width apart, move head up and down 5-10 times.  Then let symptoms settle and perform head side to side 5-10 times.  Do __2__ sessions per day.  Copyright  VHI. All rights reserved.   Feet Apart (Compliant Surface) Varied Arm Positions - Eyes Closed    Stand on compliant surface: __pillow______ with feet shoulder width apart and arms out. Close eyes and visualize upright  position. Hold__30__ seconds. Repeat _3___ times per session. Do __2__ sessions per day.  Copyright  VHI. All rights reserved.

## 2015-08-18 ENCOUNTER — Ambulatory Visit: Payer: Medicare Other | Admitting: Rehabilitative and Restorative Service Providers"

## 2015-08-20 ENCOUNTER — Ambulatory Visit: Payer: Medicare Other | Admitting: Rehabilitative and Restorative Service Providers"

## 2015-08-20 DIAGNOSIS — R42 Dizziness and giddiness: Secondary | ICD-10-CM

## 2015-08-20 DIAGNOSIS — R2689 Other abnormalities of gait and mobility: Secondary | ICD-10-CM

## 2015-08-20 NOTE — Therapy (Signed)
[facility] [address] [place], [place], [address] Phone: [phone]   Fax:  [phone]  Physical Therapy Treatment  Patient Details  Name: [name] [name] MRN: [id number] Date of Birth: 1980/09/10 Referring Provider: Leighton Ruff, MD  Encounter Date: 08/20/2015      PT End of Session - 08/20/15 0939    Visit Number 4   Number of Visits 10   Date for PT Re-Evaluation 10/02/15   Authorization Type G code every 10th visit.   PT Start Time [phone]   PT Stop Time 1013   PT Time Calculation (min) 40 min   Activity Tolerance Patient tolerated treatment well   Behavior During Therapy WFL for tasks assessed/performed      Past Medical History  Diagnosis Date  . Seizures ([place])     last experienced a seizure x 3 years ago  . Migraine   . Hypertension   . Bipolar disorder ([place])   . Vitamin D deficiency   . Behcet's disease [facility])     Past Surgical History  Procedure Laterality Date  . Appendectomy    . Maxilofacial    . Fracture surgery Right     Arm    There were no vitals filed for this visit.      Subjective Assessment - 08/20/15 0935    Subjective The patient reports occasional dizziness "not a whole lot".  Worse with standing up quickly or turning quickly. He reports he went to MD and has lesion in his ear from [facility].   Patient feels 90% improved.    Pertinent History seizures   Patient Stated Goals Try to get my movement to where I'm not dizzy.  Stop the sickness/vomiting.   Currently in Pain? Yes  skin sores from autoimmune disorder      NEUROMUSCULAR RE-EDUCATION: Sensory Organization Testing=42% compared to age/height normative value of 72%.   Patient demonstrated WNLs use of somatosensory feedback for balance, diminished to 45% (compared to 75%norm)  use of visual feedback for balance, and diminished to 20% (compared to 55% norm) use of vestibular feedback for balance.  SELF CARE/HOME  MANAGEMENT: Discussed returning to gym routine to increase activity Educated on nature of HEP and how to progress in order to reduce long term motion sensitivity.       [facility] Adult PT Treatment/Exercise - 08/20/15 0001    Ambulation/Gait   Ambulation/Gait Yes   Gait Comments Reports gait with head turns okay at home, challenging in supermarket.          Vestibular Treatment/Exercise - 08/20/15 0946    Vestibular Treatment/Exercise   Vestibular Treatment Provided Habituation;Gaze   Habituation Exercises Seated Horizontal Head Turns   Gaze Exercises X1 Viewing Horizontal;X1 Viewing Vertical   Seated Horizontal Head Turns   Number of Reps  5   Symptom Description  1/10 symptoms   X1 Viewing Horizontal   Foot Position standing provokes visual changes with spots viewed and movement of the target.  He also notes imbalance and dizziness feeling that eyes and head have to catch up.    Comments Patient could do 16-20 seconds x 2 reps, then cued to slow pace when he felt he wanted to stop and able to tolerate up to 45 seconds with dizziness and imbalance noted.     X1 Viewing Vertical   Foot Position standing x 16 seconds and patient rests due to feeling "off"  PT Education - 08/20/15 1027    Education provided Yes   Education Details Reviewed exercises from last session 5/12 and discussed how to progress gaze activities   Person(s) Educated Patient   Methods Explanation;Demonstration   Comprehension Verbalized understanding;Returned demonstration          PT Short Term Goals - 08/20/15 0939    PT SHORT TERM GOAL #1   Title The patient will return demo HEP for habituation, gaze, balance and general mobility.   Baseline Target date:  09/02/2015   Time 4   Period Weeks   Status On-going   PT SHORT TERM GOAL #2   Title The patient will tolerate seated head turns x 5 reps at self selected pace with dizziness 1/0.   Baseline Met on 08/20/2015   Time 4   Period  Weeks   Status Achieved   PT SHORT TERM GOAL #3   Title The patient will tolerate gaze x 1 adaptation x 30 seconds in horizontal plane at self selected pace for improved tolerance to movement.   Baseline Met on 08/13/2015   Time 4   Period Weeks   Status Achieved   PT SHORT TERM GOAL #4   Title The patient will be further assessed on DGI, SOT as indicated and LTGs to follow.   Baseline SOT performed 5/19=42%.  Gait is hindered pre-morbidly due to slowed pace + joint pain.   Time 4   Period Weeks   Status Achieved           PT Long Term Goals - 08/20/15 0941    PT LONG TERM GOAL #1   Title The patient will reduce dizziness handicap inventory from 74% to < or equal to 50% for improved self perception of dizziness.   Baseline Target date 10/03/2015   Time 8   Period Weeks   PT LONG TERM GOAL #2   Title The patient will tolerate gaze x 1 viewing x 60 seconds at self selected pace with dizziness 1/10 or less.   Baseline Target date 10/03/2015   Time 8   Period Weeks   PT LONG TERM GOAL #3   Title The patient will return demo progression of HEP for post d/c.   Baseline Target date 10/03/2015   Time 8   Period Weeks   PT LONG TERM GOAL #4   Title DGI goal to follow, as indicated   Baseline See STGs.   Time 8   Period Weeks   Status Deferred   PT LONG TERM GOAL #5   Title Improve SOT from 42% up to 58%.   Baseline Target date 10/03/2015   Time 8   Period Weeks   Status Revised   PT LONG TERM GOAL #6   Title Improve gait speed from 1.21 ft/sec to > or equal to 2.6 ft/sec to demo return to "full community ambulator" classification of gait.   Baseline Target date 10/03/2015   Time 8   Period Weeks               Plan - 08/20/15 1029    Clinical Impression Statement The patient met STG for SOT assessment and LTG revised.  Patient with multi-sensory balance impairments.  Continue working to [employer] and d/c planning.   PT Treatment/Interventions ADLs/Self Care Home  Management;Vestibular;Manual techniques;Therapeutic exercise;Therapeutic activities;Functional mobility training;Neuromuscular re-education;Patient/family education;Gait training   PT Next Visit Plan Multi-sensory balance training, habituation, increasing speed of movement, balance/dynamic gait activities.    Consulted and Agree with  Plan of Care Patient      Patient will benefit from skilled therapeutic intervention in order to improve the following deficits and impairments:  Abnormal gait, Decreased balance, Dizziness, Decreased mobility  Visit Diagnosis: Dizziness and giddiness  Other abnormalities of gait and mobility     Problem List Patient Active Problem List   Diagnosis Date Noted  . Peripheral vertigo 07/17/2015  . Vertigo 07/17/2015  . Intractable vomiting 07/17/2015  . Diplopia   . Cellulitis and abscess of trunk-right chest wall 05/08/2013  . Dehydration 02/15/2013  . Behcet's disease ([place]) 02/15/2013  . Recurrent vomiting 02/15/2013  . Encounter for therapeutic drug monitoring 06/14/2012  . History of oral aphthous ulcers 10/19/2010  . BIPOLAR DISORDER UNSPECIFIED 06/18/2008  . Essential hypertension 06/18/2008  . SYNCOPE AND COLLAPSE 06/18/2008  . Epilepsy ([place]) 06/18/2008    [place], PT 08/20/2015, 10:30 AM  [facility] [address] [place], [place], [address] Phone: [phone]   Fax:  [phone]  Name: ADALID BECKMANN MRN: [id number] Date of Birth: Mar 06, 1981

## 2015-08-25 ENCOUNTER — Ambulatory Visit: Payer: Medicare Other | Admitting: Rehabilitative and Restorative Service Providers"

## 2015-08-26 ENCOUNTER — Ambulatory Visit: Payer: Medicare Other | Admitting: Physical Therapy

## 2015-09-01 ENCOUNTER — Encounter: Payer: Self-pay | Admitting: Rehabilitative and Restorative Service Providers"

## 2015-09-01 ENCOUNTER — Ambulatory Visit: Payer: Medicare Other | Admitting: Rehabilitative and Restorative Service Providers"

## 2015-09-01 DIAGNOSIS — R42 Dizziness and giddiness: Secondary | ICD-10-CM

## 2015-09-01 DIAGNOSIS — R2689 Other abnormalities of gait and mobility: Secondary | ICD-10-CM

## 2015-09-01 NOTE — Therapy (Signed)
[facility] [address] [place], [place], [address] Phone: [phone]   Fax:  [phone]  Patient Details  Name: [name] [name] MRN: [id number] Date of Birth: [birth year] Referring Provider:  No ref. provider found  Encounter Date: 09/01/2015  PHYSICAL THERAPY DISCHARGE SUMMARY  Visits from Start of Care: 5  Current functional level related to goals / functional outcomes:     PT Short Term Goals - 09/01/15 0944    PT SHORT TERM GOAL #1   Title The patient will return demo HEP for habituation, gaze, balance and general mobility.   Baseline Met on 09/01/2015   Time 4   Period Weeks   Status Achieved   PT SHORT TERM GOAL #2   Title The patient will tolerate seated head turns x 5 reps at self selected pace with dizziness 1/0.   Baseline Met on 08/20/2015   Time 4   Period Weeks   Status Achieved   PT SHORT TERM GOAL #3   Title The patient will tolerate gaze x 1 adaptation x 30 seconds in horizontal plane at self selected pace for improved tolerance to movement.   Baseline Met on 08/13/2015   Time 4   Period Weeks   Status Achieved   PT SHORT TERM GOAL #4   Title The patient will be further assessed on DGI, SOT as indicated and LTGs to follow.   Baseline SOT performed 5/19=42%.  Gait is hindered pre-morbidly due to slowed pace + joint pain.   Time 4   Period Weeks   Status Achieved         PT Long Term Goals - 09/01/15 0945    PT LONG TERM GOAL #1   Title The patient will reduce dizziness handicap inventory from 74% to < or equal to 50% for improved self perception of dizziness.   Baseline Patient's score on 09/01/2015 at end of session is:  DHI=70%  (after dry heaving on sensory organization test)- patient subjectively reported 95% improvement since eval at beginning of session.   Time 8   Period Weeks   Status Not Met   PT LONG TERM GOAL #2   Title The patient will tolerate gaze x 1 viewing x 60 seconds at  self selected pace with dizziness 1/10 or less.   Baseline Met on 09/01/2015   Time 8   Period Weeks   Status Achieved   PT LONG TERM GOAL #3   Title The patient will return demo progression of HEP for post d/c.   Baseline Target date 10/03/2015   Time 8   Period Weeks   Status Achieved   PT LONG TERM GOAL #4   Title DGI goal to follow, as indicated   Baseline See STGs.   Time 8   Period Weeks   Status Deferred   PT LONG TERM GOAL #5   Title Improve SOT from 42% up to 58%.   Baseline Patient was performing significantly better than at initial testing, however test stopped due to nausea and dry heaving today.    Time 8   Period Weeks   Status Deferred   PT LONG TERM GOAL #6   Title Improve gait speed from 1.21 ft/sec to > or equal to 2.6 ft/sec to demo return to "full community ambulator" classification of gait.   Baseline Improved to 2.09 ft/sec on 09/01/2015- gait speed limited by premorbid health conditions as well.    Time 8   Period Weeks  Status Partially Met        Remaining deficits: Motion sensitivity with multi-sensory balance challenges   Education / Equipment: HEP, progression of activity, return to community exercise program.  Plan: Patient agrees to discharge.  Patient goals were partially met. Patient is being discharged due to meeting the stated rehab goals.  ?????       Thank you for the referral of this patient. Rudell Cobb, MPT  [place] 09/01/2015, 1:51 PM  [facility] [address] [place], [place], [address] Phone: [phone]   Fax:  [phone]

## 2015-09-01 NOTE — Therapy (Signed)
[facility] [address] [place], [place], [address] Phone: [phone]   Fax:  [phone]  Physical Therapy Treatment  Patient Details  Name: [name] [name] MRN: [id number] Date of Birth: 10-10-1980 Referring Provider: Leighton Ruff, MD  Encounter Date: 09/01/2015      PT End of Session - 09/01/15 1000    Visit Number 5   Number of Visits 10   Date for PT Re-Evaluation 10/02/15   Authorization Type G code every 10th visit.   PT Start Time [phone]   PT Stop Time 1012   PT Time Calculation (min) 34 min   Activity Tolerance Patient tolerated treatment well   Behavior During Therapy [facility] for tasks assessed/performed      Past Medical History  Diagnosis Date  . Seizures ([place])     last experienced a seizure x 3 years ago  . Migraine   . Hypertension   . Bipolar disorder ([place])   . Vitamin D deficiency   . Behcet's disease [facility])     Past Surgical History  Procedure Laterality Date  . Appendectomy    . Maxilofacial    . Fracture surgery Right     Arm    There were no vitals filed for this visit.      Subjective Assessment - 09/01/15 0941    Subjective The patient reports he is 95% better.  He reports if he turns quickly or stands up quickly, he may have to take a minute to let things settle.        NEUROMUSCULAR RE-EDUCATION: Reviewed gaze x 1 viewing for HEP with patient performing x 60 seconds with symptoms 1/10. Sensory Organization Testing= *ATTEMPTED SOT and patient had dry heaving with nausea that hit suddenly during condition 4 in which the support surface moves.   Stopped testing due to nausea/dry heaving.   Gait speed=2.09 ft/sec  SELF CARE/HOME MANAGEMENT: Discussed post d/c progression of HEP and returning to gym routine (patient has returned to swimming at [facility]).   DHI=70%          PT Short Term Goals - 09/01/15 0944    PT SHORT TERM GOAL #1   Title The patient will return demo HEP  for habituation, gaze, balance and general mobility.   Baseline Met on 09/01/2015   Time 4   Period Weeks   Status Achieved   PT SHORT TERM GOAL #2   Title The patient will tolerate seated head turns x 5 reps at self selected pace with dizziness 1/0.   Baseline Met on 08/20/2015   Time 4   Period Weeks   Status Achieved   PT SHORT TERM GOAL #3   Title The patient will tolerate gaze x 1 adaptation x 30 seconds in horizontal plane at self selected pace for improved tolerance to movement.   Baseline Met on 08/13/2015   Time 4   Period Weeks   Status Achieved   PT SHORT TERM GOAL #4   Title The patient will be further assessed on DGI, SOT as indicated and LTGs to follow.   Baseline SOT performed 5/19=42%.  Gait is hindered pre-morbidly due to slowed pace + joint pain.   Time 4   Period Weeks   Status Achieved           PT Long Term Goals - 09/01/15 0945    PT LONG TERM GOAL #1   Title The patient will reduce dizziness handicap inventory from 74% to < or equal  to 50% for improved self perception of dizziness.   Baseline Patient's score on 09/01/2015 at end of session is:  DHI=70%  (after dry heaving on sensory organization test)- patient subjectively reported 95% improvement since eval at beginning of session.   Time 8   Period Weeks   Status Not Met   PT LONG TERM GOAL #2   Title The patient will tolerate gaze x 1 viewing x 60 seconds at self selected pace with dizziness 1/10 or less.   Baseline Met on 09/01/2015   Time 8   Period Weeks   Status Achieved   PT LONG TERM GOAL #3   Title The patient will return demo progression of HEP for post d/c.   Baseline Target date 10/03/2015   Time 8   Period Weeks   Status Achieved   PT LONG TERM GOAL #4   Title DGI goal to follow, as indicated   Baseline See STGs.   Time 8   Period Weeks   Status Deferred   PT LONG TERM GOAL #5   Title Improve SOT from 42% up to 58%.   Baseline Patient was performing significantly better than at  initial testing, however test stopped due to nausea and dry heaving today.    Time 8   Period Weeks   Status Deferred   PT LONG TERM GOAL #6   Title Improve gait speed from 1.21 ft/sec to > or equal to 2.6 ft/sec to demo return to "full community ambulator" classification of gait.   Baseline Improved to 2.09 ft/sec on 09/01/2015- gait speed limited by premorbid health conditions as well.    Time 8   Period Weeks   Status Partially Met               Plan - 09/01/15 1014    Clinical Impression Statement The patient partially met STGs and LTGs.  Patient has HEP to continue working on gaze adaptation, habituation and general mobility.  He feels 95% improvement since beginning PT.  Although he did not tolerate multi-sensory environment today with SOT/posturography, he does not frequently perfomr these tasks in day to day routine.  The patient has pre-morbid mobility limitations due to past medical history.   PT Treatment/Interventions ADLs/Self Care Home Management;Vestibular;Manual techniques;Therapeutic exercise;Therapeutic activities;Functional mobility training;Neuromuscular re-education;Patient/family education;Gait training   PT Next Visit Plan Discharge today.    Consulted and Agree with Plan of Care Patient      Patient will benefit from skilled therapeutic intervention in order to improve the following deficits and impairments:  Abnormal gait, Decreased balance, Dizziness, Decreased mobility  Visit Diagnosis: Dizziness and giddiness  Other abnormalities of gait and mobility     Problem List Patient Active Problem List   Diagnosis Date Noted  . Peripheral vertigo 07/17/2015  . Vertigo 07/17/2015  . Intractable vomiting 07/17/2015  . Diplopia   . Cellulitis and abscess of trunk-right chest wall 05/08/2013  . Dehydration 02/15/2013  . Behcet's disease ([place]) 02/15/2013  . Recurrent vomiting 02/15/2013  . Encounter for therapeutic drug monitoring 06/14/2012  . History  of oral aphthous ulcers 10/19/2010  . BIPOLAR DISORDER UNSPECIFIED 06/18/2008  . Essential hypertension 06/18/2008  . SYNCOPE AND COLLAPSE 06/18/2008  . Epilepsy ([place]) 06/18/2008    [place], PT 09/01/2015, 1:50 PM  [facility] [address] [place], [place], [address] Phone: [phone]   Fax:  [phone]  Name: [name] [name] MRN: [id number] Date of Birth: [birth year]

## 2015-10-07 ENCOUNTER — Telehealth: Payer: Self-pay | Admitting: *Deleted

## 2015-10-07 NOTE — Telephone Encounter (Signed)
DMV/Form completed, reviewed and signed by MM/NP.   Forwarded to MR.

## 2015-10-08 ENCOUNTER — Telehealth: Payer: Self-pay | Admitting: *Deleted

## 2015-10-08 NOTE — Telephone Encounter (Signed)
Patient form faxed to [facility] on 10/08/15.

## 2016-01-27 ENCOUNTER — Ambulatory Visit (INDEPENDENT_AMBULATORY_CARE_PROVIDER_SITE_OTHER): Payer: Medicare Other | Admitting: Adult Health

## 2016-01-27 ENCOUNTER — Encounter: Payer: Self-pay | Admitting: Adult Health

## 2016-01-27 VITALS — BP 126/78 | HR 110 | Temp 97.0°F | Resp 16 | Ht 72.0 in | Wt 235.5 lb

## 2016-01-27 DIAGNOSIS — R569 Unspecified convulsions: Secondary | ICD-10-CM | POA: Diagnosis not present

## 2016-01-27 DIAGNOSIS — M352 Behcet's disease: Secondary | ICD-10-CM | POA: Diagnosis not present

## 2016-01-27 DIAGNOSIS — R51 Headache: Secondary | ICD-10-CM

## 2016-01-27 DIAGNOSIS — R519 Headache, unspecified: Secondary | ICD-10-CM

## 2016-01-27 DIAGNOSIS — H5712 Ocular pain, left eye: Secondary | ICD-10-CM

## 2016-01-27 DIAGNOSIS — Z5181 Encounter for therapeutic drug level monitoring: Secondary | ICD-10-CM | POA: Diagnosis not present

## 2016-01-27 NOTE — Progress Notes (Signed)
I have read the note, and I agree with the clinical assessment and plan.  Nisreen Guise KEITH   

## 2016-01-27 NOTE — Progress Notes (Signed)
PATIENT: [name] [name] DOB: Aug 03, 1980  REASON FOR VISIT: follow up- seizures, Behcet's disease HISTORY FROM: patient  HISTORY OF PRESENT ILLNESS: [name] [name] is a 35 year old male with a history of epilepsy and Behcet's disease. He returns today for follow-up. He continues on Lamictal and tolerating it well. Denies any seizure events. He returns today with a main complaint of "not feeling well." He states in the last 2 weeks he feels that his balance is off. He feels that the left eye "drags." States that he does not feel that it moves as fast as the other eye. He also has pain behind the eye that shoots up to the top of the head. He states that he had an episode of vertigo 6 months ago he was in the hospital. Once he was discharged he was sent to vestibular rehabilitation with good benefit. He states that at times he feels that he will just fall over. He reports that he sees a rheumatologist Sanda Klein at [facility] last appointment was 4 months ago. He saw his primary care provider 1-1/2 months ago however that was prior to these new symptoms.  HISTORY 07/15/15: [name] [name] is a 35 year old male with a history of behcet disease and epilepsy. He returns today for follow-up. He is only taking Lamictal 200 mg twice a day and tolerating it well. He denies any seizure events. Patient operates a motor vehicle without difficulty. He is able to complete all ADLs independently. Denies any changes with his gait or balance. He is followed through [facility] for his Behcet's disease. He will undergo surgery next week for hernias. He denies any new neurological symptoms. He returns today for an evaluation.  HISTORY 01/12/14 (MM): [name] [name] is a 35 year old right-handed white male with a history of Behcet's disease and epilepsy. The patient has done quite well on Lamictal taking 200 mg twice daily. He indicates that he has not had any seizures since last seen one year ago, and he is tolerating the  medication well. The patient indicates that he has had ongoing issues with his Behcet's disease, with skin lesions and infections, lesions in the esophagus, and some visual disturbances. The patient has followed up with Dr.Tim Hassell Done through neuro-ophthalmology at [facility], and he gets his rheumatologic care through [facility] as well. The patient returns for an evaluation.   REVIEW OF SYSTEMS: Out of a complete 14 system review of symptoms, the patient complains only of the following symptoms, and all other reviewed systems are negative.  Dizziness, headache, weakness, depression, nervous/anxious, wounds, swollen abdomen, eye pain, blurred vision  ALLERGIES: No Known Allergies  HOME MEDICATIONS: Outpatient Medications Prior to Visit  Medication Sig Dispense Refill  . Apremilast (OTEZLA) 30 MG TABS Take 30 mg by mouth 2 (two) times daily.     Marland Kitchen atenolol (TENORMIN) 50 MG tablet Take 1 tablet (50 mg total) by mouth daily. (Patient taking differently: Take 50 mg by mouth 2 (two) times daily. ) 30 tablet 0  . clonazePAM (KLONOPIN) 0.5 MG tablet Take 1 tablet (0.5 mg total) by mouth 3 (three) times daily. For anxiety 90 tablet 0  . Eszopiclone 3 MG TABS Take 3 mg by mouth at bedtime. Lunesta    . folic acid (FOLVITE) 1 MG tablet Take 1 mg by mouth daily.     . hydrOXYzine (VISTARIL) 50 MG capsule Take 50 mg by mouth at bedtime.     . lamoTRIgine (LAMICTAL) 200 MG tablet Take 1 tablet (200 mg  total) by mouth 2 (two) times daily. 60 tablet 5  . Methotrexate Sodium (METHOTREXATE, PF,) 50 MG/2ML injection Inject 25 mg into the skin every Sunday.    Marland Kitchen QUEtiapine (SEROQUEL XR) 300 MG 24 hr tablet Take 300 mg by mouth at bedtime.    Marland Kitchen esomeprazole (NEXIUM) 40 MG capsule Take 40 mg by mouth 2 (two) times daily before a meal.    . HYDROcodone-acetaminophen (NORCO/VICODIN) 5-325 MG tablet Take 1 tablet by mouth every 6 (six) hours as needed.    . meclizine (ANTIVERT) 32 MG tablet Take 1 tablet (32 mg total) by mouth  3 (three) times daily as needed for dizziness or nausea. (Patient not taking: Reported on 01/27/2016) 30 tablet 0  . ondansetron (ZOFRAN ODT) 4 MG disintegrating tablet Take 1 tablet (4 mg total) by mouth every 8 (eight) hours as needed for nausea or vomiting. (Patient not taking: Reported on 01/27/2016) 30 tablet 0   No facility-administered medications prior to visit.     PAST MEDICAL HISTORY: Past Medical History:  Diagnosis Date  . Behcet's disease ([place])   . Bipolar disorder ([place])   . Hypertension   . Migraine   . Seizures ([place])    last experienced a seizure x 3 years ago  . Vitamin D deficiency     PAST SURGICAL HISTORY: Past Surgical History:  Procedure Laterality Date  . APPENDECTOMY    . FRACTURE SURGERY Right    Arm  . maxilofacial      FAMILY HISTORY: Family History  Problem Relation Age of Onset  . Lung cancer Paternal Grandfather   . Other Father     BPPV    SOCIAL HISTORY: Social History   Social History  . Marital status: Single    Spouse name: N/A  . Number of children: 0  . Years of education: 69   Occupational History  .  Disabled   Social History Main Topics  . Smoking status: Never Smoker  . Smokeless tobacco: Not on file  . Alcohol use No  . Drug use: No  . Sexual activity: Not on file   Other Topics Concern  . Not on file   Social History Narrative  . No narrative on file      PHYSICAL EXAM  Vitals:   01/27/16 0805  BP: 126/78  Pulse: (!) 110  Resp: 16  Weight: 235 lb 8 oz (106.8 kg)  Height: 6' (1.829 m)   Body mass index is 31.94 kg/m.   No flowsheet data found.  Generalized: Well developed, sluggish HEENT: Sclera are clear bilaterally  Neurological examination  Mentation: Alert oriented to time, place, history taking. Follows all commands speech and language fluent. Repeating directions multiple times Cranial nerve II-XII: Pupils were equal round reactive to light. Extraocular movements were full, visual field  were full on confrontational test. Facial sensation and strength were normal. Uvula tongue midline. Head turning and shoulder shrug  were normal and symmetric. Motor: The motor testing reveals 5 over 5 strength of all 4 extremities. Good symmetric motor tone is noted throughout.  Sensory: Sensory testing is intact to soft touch on all 4 extremities. No evidence of extinction is noted.  Coordination: Cerebellar testing reveals good finger-nose-finger ( having to repeat insturstions) and heel-to-shin bilaterally.  Gait and station: Gait is normal. Tandem gait is normal. Romberg is negative. No drift is seen.  Reflexes: Deep tendon reflexes are symmetric and normal bilaterally.   DIAGNOSTIC DATA (LABS, IMAGING, TESTING) - I reviewed patient records,  labs, notes, testing and imaging myself where available.  Lab Results  Component Value Date   WBC 11.2 (H) 07/24/2015   HGB 17.4 (H) 07/24/2015   HCT 51.4 07/24/2015   MCV 92.3 07/24/2015   PLT 272 07/24/2015      Component Value Date/Time   NA 138 07/24/2015 1400   NA 141 11/01/2012 1044   K 3.9 07/24/2015 1400   CL 100 (L) 07/24/2015 1400   CO2 21 (L) 07/24/2015 1400   GLUCOSE 139 (H) 07/24/2015 1400   BUN 14 07/24/2015 1400   BUN 11 11/01/2012 1044   CREATININE 1.13 07/24/2015 1400   CALCIUM 10.8 (H) 07/24/2015 1400   PROT 9.2 (H) 07/24/2015 1400   PROT 6.6 11/01/2012 1044   ALBUMIN 4.8 07/24/2015 1400   ALBUMIN 4.4 11/01/2012 1044   AST 20 07/24/2015 1400   ALT 21 07/24/2015 1400   ALKPHOS 86 07/24/2015 1400   BILITOT 1.0 07/24/2015 1400   GFRNONAA >60 07/24/2015 1400   GFRAA >60 07/24/2015 1400      ASSESSMENT AND PLAN 35 y.o. year old male  has a past medical history of Behcet's disease ([place]); Bipolar disorder ([place]); Hypertension; Migraine; Seizures ([place]); and Vitamin D deficiency. here with:  1. Seizures 2. Behcet's disease 3. Eye pain- left 4. Headache  The patient comes in today not feeling well. He is having new  symptoms of left eye pain, headache and "feeling off." I consulted with Dr. Jannifer [place]. I will send the patient for an MRI of the brain to rule out any acute changes contributing to his eye pain and headache. I will also check blood work today. Patient is advised to continue on Lamictal. He should call his rheumatologist Dr. Hassell Done and make him aware of his new symptoms. I reiterated to the patient that if his symptoms worsen he should go to the emergency room. He will follow-up in 3 months or sooner if needed. I will call him with results.       Ward Givens, MSN, NP-C 01/27/2016, 8:28 AM [facility] [address], [place], [place] [address] [phone]

## 2016-01-27 NOTE — Patient Instructions (Signed)
Blood work today MRI brain Continue lamictal Follow-up with Dr. Hassell Done If your symptoms worsen or you develop new symptoms please let us know.

## 2016-01-30 LAB — COMPREHENSIVE METABOLIC PANEL
ALK PHOS: 96 IU/L (ref 39–117)
ALT: 57 IU/L — AB (ref 0–44)
AST: 28 IU/L (ref 0–40)
Albumin/Globulin Ratio: 1.5 (ref 1.2–2.2)
Albumin: 4.4 g/dL (ref 3.5–5.5)
BUN/Creatinine Ratio: 13 (ref 9–20)
BUN: 14 mg/dL (ref 6–20)
Bilirubin Total: 0.2 mg/dL (ref 0.0–1.2)
CALCIUM: 9 mg/dL (ref 8.7–10.2)
CO2: 25 mmol/L (ref 18–29)
CREATININE: 1.07 mg/dL (ref 0.76–1.27)
Chloride: 101 mmol/L (ref 96–106)
GFR calc Af Amer: 102 mL/min/{1.73_m2} (ref >59)
GFR, EST NON AFRICAN AMERICAN: 88 mL/min/{1.73_m2} (ref >59)
Globulin, Total: 3 g/dL (ref 1.5–4.5)
Glucose: 114 mg/dL — ABNORMAL HIGH (ref 65–99)
Potassium: 4.1 mmol/L (ref 3.5–5.2)
Sodium: 142 mmol/L (ref 134–144)
Total Protein: 7.4 g/dL (ref 6.0–8.5)

## 2016-01-30 LAB — CBC WITH DIFFERENTIAL/PLATELET
BASOS: 0 %
Basophils Absolute: 0 10*3/uL (ref 0.0–0.2)
EOS (ABSOLUTE): 0.2 10*3/uL (ref 0.0–0.4)
EOS: 3 %
HEMATOCRIT: 44.5 % (ref 37.5–51.0)
HEMOGLOBIN: 14.6 g/dL (ref 12.6–17.7)
IMMATURE GRANS (ABS): 0 10*3/uL (ref 0.0–0.1)
IMMATURE GRANULOCYTES: 1 %
LYMPHS: 21 %
Lymphocytes Absolute: 1.1 10*3/uL (ref 0.7–3.1)
MCH: 31.1 pg (ref 26.6–33.0)
MCHC: 32.8 g/dL (ref 31.5–35.7)
MCV: 95 fL (ref 79–97)
MONOCYTES: 9 %
MONOS ABS: 0.4 10*3/uL (ref 0.1–0.9)
NEUTROS PCT: 66 %
Neutrophils Absolute: 3.3 10*3/uL (ref 1.4–7.0)
Platelets: 174 10*3/uL (ref 150–379)
RBC: 4.69 x10E6/uL (ref 4.14–5.80)
RDW: 12.8 % (ref 12.3–15.4)
WBC: 5 10*3/uL (ref 3.4–10.8)

## 2016-01-30 LAB — LAMOTRIGINE LEVEL: Lamotrigine Lvl: 11.5 ug/mL (ref 2.0–20.0)

## 2016-01-30 LAB — SEDIMENTATION RATE: SED RATE: 9 mm/h (ref 0–15)

## 2016-01-31 ENCOUNTER — Telehealth: Payer: Self-pay | Admitting: *Deleted

## 2016-01-31 NOTE — Telephone Encounter (Signed)
-----   Message from Ward Givens, NP sent at 01/31/2016  8:29 AM EDT ----- Lab work ok. ALT slightly elevated. We will continue to monitor. Please call patient with results.

## 2016-01-31 NOTE — Telephone Encounter (Signed)
Spoke to pt and relayed that lab results ok, one of liver enzymes (ALT) slightly elevated, will continue to monitor,  He is scheduled for MRI 02-06-16.  He verbalized understanding.

## 2016-02-06 ENCOUNTER — Ambulatory Visit
Admission: RE | Admit: 2016-02-06 | Discharge: 2016-02-06 | Disposition: A | Discharge status: Home / Self Care | Payer: Medicare Other | Source: Ambulatory Visit | Attending: Adult Health | Admitting: Adult Health

## 2016-02-06 DIAGNOSIS — H5712 Ocular pain, left eye: Secondary | ICD-10-CM

## 2016-02-06 DIAGNOSIS — M352 Behcet's disease: Secondary | ICD-10-CM | POA: Diagnosis not present

## 2016-02-06 DIAGNOSIS — R519 Headache, unspecified: Secondary | ICD-10-CM

## 2016-02-06 DIAGNOSIS — R51 Headache: Secondary | ICD-10-CM | POA: Diagnosis not present

## 2016-02-06 MED ORDER — GADOBENATE DIMEGLUMINE 529 MG/ML IV SOLN
20.0000 mL | Freq: Once | INTRAVENOUS | Status: AC | PRN
  Administered 2016-02-06: 20 mL via INTRAVENOUS

## 2016-02-07 ENCOUNTER — Telehealth: Payer: Self-pay

## 2016-02-07 NOTE — Telephone Encounter (Signed)
-----   Message from Ward Givens, NP sent at 02/07/2016  7:31 AM EST ----- Mild chronic left maxillary sinusitis. Otherwise no change compared with MRI 07/16/15. Please call patient.

## 2016-02-07 NOTE — Telephone Encounter (Signed)
LM on VM (per DPR) with results below

## 2016-04-27 ENCOUNTER — Ambulatory Visit (INDEPENDENT_AMBULATORY_CARE_PROVIDER_SITE_OTHER): Payer: Medicare Other | Admitting: Adult Health

## 2016-04-27 ENCOUNTER — Encounter: Payer: Self-pay | Admitting: Adult Health

## 2016-04-27 ENCOUNTER — Telehealth: Payer: Self-pay | Admitting: Adult Health

## 2016-04-27 VITALS — BP 119/82 | HR 99 | Ht 72.0 in | Wt 242.2 lb

## 2016-04-27 DIAGNOSIS — Z5181 Encounter for therapeutic drug level monitoring: Secondary | ICD-10-CM

## 2016-04-27 DIAGNOSIS — M352 Behcet's disease: Secondary | ICD-10-CM

## 2016-04-27 DIAGNOSIS — R569 Unspecified convulsions: Secondary | ICD-10-CM

## 2016-04-27 MED ORDER — LAMOTRIGINE 200 MG PO TABS: 200.0000 mg | ORAL_TABLET | Freq: Two times a day (BID) | ORAL | 5 refills | Status: DC

## 2016-04-27 NOTE — Addendum Note (Signed)
Addended byOliver Hum on: 04/27/2016 04:14 PM   Modules accepted: Orders

## 2016-04-27 NOTE — Telephone Encounter (Signed)
Patient called requesting refill for lamoTRIgine (LAMICTAL) 200 MG tablet.  Patient states he was seen this morning.  Please call

## 2016-04-27 NOTE — Progress Notes (Signed)
I have read the note, and I agree with the clinical assessment and plan.  WILLIS,CHARLES KEITH   

## 2016-04-27 NOTE — Progress Notes (Signed)
PATIENT: [name] [name] DOB: 1980/08/16  REASON FOR VISIT: follow up- epilepsy, Behcet's disease HISTORY FROM: patient  HISTORY OF PRESENT ILLNESS: [name] [name] is a 36 year old male with a history of epilepsy and Behcet's disease. He returns today for follow-up. He remains on Lamictal and is tolerating it well. Denies any seizure events. He lives at home with his family. He is able to complete all ADLs independently. He operates a Teacher, music without difficulty. He states that he has not followed up with his rheumatologist Dr. Hassell Done. He states that he continues to not feel well. He states that he feels that his body is off. He states that it tends to be worse when he exercises. His primary care provider has evaluated him and according to the patient advised that he follow up with Dr. Hassell Done. At the last visit with our office blood work was relatively unremarkable and an MRI showed no changes. He returns today for an evaluation.  HISTORY 01/27/16:  [name] [name] is a 36 year old male with a history of epilepsy and Behcet's disease. He returns today for follow-up. He continues on Lamictal and tolerating it well. Denies any seizure events. He returns today with a main complaint of "not feeling well." He states in the last 2 weeks he feels that his balance is off. He feels that the left eye "drags." States that he does not feel that it moves as fast as the other eye. He also has pain behind the eye that shoots up to the top of the head. He states that he had an episode of vertigo 6 months ago he was in the hospital. Once he was discharged he was sent to vestibular rehabilitation with good benefit. He states that at times he feels that he will just fall over. He reports that he sees a rheumatologist Sanda Klein at [facility] last appointment was 4 months ago. He saw his primary care provider 1-1/2 months ago however that was prior to these new symptoms.  HISTORY 07/15/15: [name] [name] is a 36 year old male with a history  of behcet disease and epilepsy. He returns today for follow-up. He is only taking Lamictal 200 mg twice a day and tolerating it well. He denies any seizure events. Patient operates a motor vehicle without difficulty. He is able to complete all ADLs independently. Denies any changes with his gait or balance. He is followed through [facility] for his Behcet's disease. He will undergo surgery next week for hernias. He denies any new neurological symptoms. He returns today for an evaluation.  HISTORY 01/12/14 (MM): [name] [name] is a 36 year old right-handed white male with a history of Behcet's disease and epilepsy. The patient has done quite well on Lamictal taking 200 mg twice daily. He indicates that he has not had any seizures since last seen one year ago, and he is tolerating the medication well. The patient indicates that he has had ongoing issues with his Behcet's disease, with skin lesions and infections, lesions in the esophagus, and some visual disturbances. The patient has followed up with Dr.Tim Hassell Done through neuro[facility]ophthalmology at [facility], and he gets his rheumatologic care through [facility] as well. The patient returns for an evaluation.   REVIEW OF SYSTEMS: Out of a complete 14 system review of symptoms, the patient complains only of the following symptoms, and all other reviewed systems are negative.  Joint pain, joint swelling, headache, depression, nervous/anxious  ALLERGIES: No Known Allergies  HOME MEDICATIONS: Outpatient Medications Prior to Visit  Medication  Sig Dispense Refill  . Apremilast (OTEZLA) 30 MG TABS Take 30 mg by mouth 2 (two) times daily.     Marland Kitchen atenolol (TENORMIN) 50 MG tablet Take 1 tablet (50 mg total) by mouth daily. (Patient taking differently: Take 50 mg by mouth 2 (two) times daily. ) 30 tablet 0  . clonazePAM (KLONOPIN) 0.5 MG tablet Take 1 tablet (0.5 mg total) by mouth 3 (three) times daily. For anxiety 90 tablet 0  . esomeprazole (NEXIUM) 40 MG  capsule Take 40 mg by mouth 2 (two) times daily before a meal.    . Eszopiclone 3 MG TABS Take 3 mg by mouth at bedtime. Lunesta    . FLUoxetine (PROZAC) 20 MG tablet Take 20 mg by mouth daily.    . folic acid (FOLVITE) 1 MG tablet Take 1 mg by mouth daily.     Marland Kitchen HYDROcodone-acetaminophen (NORCO/VICODIN) 5-325 MG tablet Take 1 tablet by mouth every 6 (six) hours as needed.    . hydrOXYzine (VISTARIL) 50 MG capsule Take 50 mg by mouth at bedtime.     . lamoTRIgine (LAMICTAL) 200 MG tablet Take 1 tablet (200 mg total) by mouth 2 (two) times daily. 60 tablet 5  . meclizine (ANTIVERT) 32 MG tablet Take 1 tablet (32 mg total) by mouth 3 (three) times daily as needed for dizziness or nausea. (Patient not taking: Reported on 01/27/2016) 30 tablet 0  . Methotrexate Sodium (METHOTREXATE, PF,) 50 MG/2ML injection Inject 25 mg into the skin every Sunday.    . ondansetron (ZOFRAN ODT) 4 MG disintegrating tablet Take 1 tablet (4 mg total) by mouth every 8 (eight) hours as needed for nausea or vomiting. (Patient not taking: Reported on 01/27/2016) 30 tablet 0  . QUEtiapine (SEROQUEL XR) 300 MG 24 hr tablet Take 300 mg by mouth at bedtime.     No facility-administered medications prior to visit.     PAST MEDICAL HISTORY: Past Medical History:  Diagnosis Date  . Behcet's disease ([place])   . Bipolar disorder ([place])   . Hypertension   . Migraine   . Seizures ([place])    last experienced a seizure x 3 years ago  . Vitamin D deficiency     PAST SURGICAL HISTORY: Past Surgical History:  Procedure Laterality Date  . APPENDECTOMY    . FRACTURE SURGERY Right    Arm  . maxilofacial      FAMILY HISTORY: Family History  Problem Relation Age of Onset  . Lung cancer Paternal Grandfather   . Other Father     BPPV    SOCIAL HISTORY: Social History   Social History  . Marital status: Single    Spouse name: N/A  . Number of children: 0  . Years of education: 36   Occupational History  .  Disabled    Social History Main Topics  . Smoking status: Never Smoker  . Smokeless tobacco: Never Used  . Alcohol use No  . Drug use: No  . Sexual activity: Not on file   Other Topics Concern  . Not on file   Social History Narrative  . No narrative on file      PHYSICAL EXAM  Vitals:   04/27/16 0819  BP: 119/82  Pulse: 99  Weight: 242 lb 3.2 oz (109.9 kg)  Height: 6' (1.829 m)   Body mass index is 32.85 kg/m.  Generalized: Well developed, in no acute distress   Neurological examination  Mentation: Alert oriented to time, place, history taking. Follows all  commands speech and language fluent Cranial nerve II-XII: Pupils were equal round reactive to light. Extraocular movements were full, visual field were full on confrontational test. Facial sensation and strength were normal. Uvula tongue midline. Head turning and shoulder shrug  were normal and symmetric. Motor: The motor testing reveals 5 over 5 strength of all 4 extremities. Good symmetric motor tone is noted throughout.  Sensory: Sensory testing is intact to soft touch on all 4 extremities. No evidence of extinction is noted.  Coordination: Cerebellar testing reveals good finger-nose-finger and heel-to-shin bilaterally.  Gait and station: Gait is normal. Tandem gait is normal. Romberg is negative. No drift is seen.  Reflexes: Deep tendon reflexes are symmetric and normal bilaterally.   DIAGNOSTIC DATA (LABS, IMAGING, TESTING) - I reviewed patient records, labs, notes, testing and imaging myself where available.  Lab Results  Component Value Date   WBC 5.0 01/27/2016   HGB 17.4 (H) 07/24/2015   HCT 44.5 01/27/2016   MCV 95 01/27/2016   PLT 174 01/27/2016      Component Value Date/Time   NA 142 01/27/2016 0921   K 4.1 01/27/2016 0921   CL 101 01/27/2016 0921   CO2 25 01/27/2016 0921   GLUCOSE 114 (H) 01/27/2016 0921   GLUCOSE 139 (H) 07/24/2015 1400   BUN 14 01/27/2016 0921   CREATININE 1.07 01/27/2016 0921    CALCIUM 9.0 01/27/2016 0921   PROT 7.4 01/27/2016 0921   ALBUMIN 4.4 01/27/2016 0921   AST 28 01/27/2016 0921   ALT 57 (H) 01/27/2016 0921   ALKPHOS 96 01/27/2016 0921   BILITOT 0.2 01/27/2016 0921   GFRNONAA 88 01/27/2016 0921   GFRAA 102 01/27/2016 0921      ASSESSMENT AND PLAN 36 y.o. year old male  has a past medical history of Behcet's disease ([place]); Bipolar disorder ([place]); Hypertension; Migraine; Seizures ([place]); and Vitamin D deficiency. here with:  1. Seizures 2. Behcet's disease  Overall the patient is doing well in regards to his seizures. He will continue on Lamictal. I will check blood work today. The patient is encouraged to follow-up with Dr. Hassell Done. Advised that if her symptoms worsen or he develops new symptoms he should let us know. He will follow-up in 6 months with Dr. Jannifer Franklin.     Ward Givens, MSN, NP-C 04/27/2016, 8:29 AM [facility] [address], [place], [place] [address] [phone]

## 2016-04-27 NOTE — Patient Instructions (Signed)
Continue Lamictal °Blood work today °If you have any seizure events please let us know.  ° ° °

## 2016-04-30 LAB — CBC WITH DIFFERENTIAL/PLATELET
BASOS: 0 %
Basophils Absolute: 0 10*3/uL (ref 0.0–0.2)
EOS (ABSOLUTE): 0.3 10*3/uL (ref 0.0–0.4)
EOS: 4 %
Hematocrit: 45.3 % (ref 37.5–51.0)
Hemoglobin: 14.7 g/dL (ref 13.0–17.7)
Immature Grans (Abs): 0 10*3/uL (ref 0.0–0.1)
Immature Granulocytes: 1 %
Lymphocytes Absolute: 1.5 10*3/uL (ref 0.7–3.1)
Lymphs: 23 %
MCH: 30.8 pg (ref 26.6–33.0)
MCHC: 32.5 g/dL (ref 31.5–35.7)
MCV: 95 fL (ref 79–97)
MONOS ABS: 0.5 10*3/uL (ref 0.1–0.9)
Monocytes: 8 %
Neutrophils Absolute: 4.3 10*3/uL (ref 1.4–7.0)
Neutrophils: 64 %
Platelets: 182 10*3/uL (ref 150–379)
RBC: 4.78 x10E6/uL (ref 4.14–5.80)
RDW: 13.4 % (ref 12.3–15.4)
WBC: 6.7 10*3/uL (ref 3.4–10.8)

## 2016-04-30 LAB — COMPREHENSIVE METABOLIC PANEL
A/G RATIO: 1.5 (ref 1.2–2.2)
ALT: 37 IU/L (ref 0–44)
AST: 18 IU/L (ref 0–40)
Albumin: 4.3 g/dL (ref 3.5–5.5)
Alkaline Phosphatase: 94 IU/L (ref 39–117)
BUN/Creatinine Ratio: 17 (ref 9–20)
BUN: 18 mg/dL (ref 6–20)
Bilirubin Total: 0.3 mg/dL (ref 0.0–1.2)
CO2: 26 mmol/L (ref 18–29)
CREATININE: 1.09 mg/dL (ref 0.76–1.27)
Calcium: 9.3 mg/dL (ref 8.7–10.2)
Chloride: 100 mmol/L (ref 96–106)
GFR calc Af Amer: 100 mL/min/{1.73_m2} (ref >59)
GFR calc non Af Amer: 86 mL/min/{1.73_m2} (ref >59)
Globulin, Total: 2.8 g/dL (ref 1.5–4.5)
Glucose: 112 mg/dL — ABNORMAL HIGH (ref 65–99)
Potassium: 4.3 mmol/L (ref 3.5–5.2)
SODIUM: 142 mmol/L (ref 134–144)
Total Protein: 7.1 g/dL (ref 6.0–8.5)

## 2016-04-30 LAB — LAMOTRIGINE LEVEL: Lamotrigine Lvl: 6.9 ug/mL (ref 2.0–20.0)

## 2016-06-21 ENCOUNTER — Encounter (HOSPITAL_COMMUNITY): Payer: Self-pay

## 2016-06-21 ENCOUNTER — Emergency Department (HOSPITAL_COMMUNITY)
Admission: EM | Admit: 2016-06-21 | Discharge: 2016-06-21 | Disposition: A | Discharge status: Home / Self Care | Payer: Medicare Other | Attending: Emergency Medicine | Admitting: Emergency Medicine

## 2016-06-21 ENCOUNTER — Emergency Department (HOSPITAL_COMMUNITY): Payer: Medicare Other

## 2016-06-21 DIAGNOSIS — Z79899 Other long term (current) drug therapy: Secondary | ICD-10-CM | POA: Insufficient documentation

## 2016-06-21 DIAGNOSIS — R569 Unspecified convulsions: Secondary | ICD-10-CM

## 2016-06-21 DIAGNOSIS — G40909 Epilepsy, unspecified, not intractable, without status epilepticus: Secondary | ICD-10-CM | POA: Insufficient documentation

## 2016-06-21 DIAGNOSIS — I10 Essential (primary) hypertension: Secondary | ICD-10-CM | POA: Diagnosis not present

## 2016-06-21 DIAGNOSIS — R079 Chest pain, unspecified: Secondary | ICD-10-CM

## 2016-06-21 DIAGNOSIS — R0789 Other chest pain: Secondary | ICD-10-CM | POA: Insufficient documentation

## 2016-06-21 LAB — CBC WITH DIFFERENTIAL/PLATELET
BASOS ABS: 0 10*3/uL (ref 0.0–0.1)
Basophils Relative: 0 %
Eosinophils Absolute: 0.1 10*3/uL (ref 0.0–0.7)
Eosinophils Relative: 2 %
HCT: 42.6 % (ref 39.0–52.0)
HEMOGLOBIN: 14.2 g/dL (ref 13.0–17.0)
Lymphocytes Relative: 15 %
Lymphs Abs: 1.1 10*3/uL (ref 0.7–4.0)
MCH: 30.8 pg (ref 26.0–34.0)
MCHC: 33.3 g/dL (ref 30.0–36.0)
MCV: 92.4 fL (ref 78.0–100.0)
MONO ABS: 0.4 10*3/uL (ref 0.1–1.0)
Monocytes Relative: 6 %
NEUTROS PCT: 77 %
Neutro Abs: 5.4 10*3/uL (ref 1.7–7.7)
Platelets: 185 10*3/uL (ref 150–400)
RBC: 4.61 MIL/uL (ref 4.22–5.81)
RDW: 12.7 % (ref 11.5–15.5)
WBC: 7.1 10*3/uL (ref 4.0–10.5)

## 2016-06-21 LAB — I-STAT CHEM 8, ED
BUN: 19 mg/dL (ref 6–20)
Calcium, Ion: 1.16 mmol/L (ref 1.15–1.40)
Chloride: 105 mmol/L (ref 101–111)
Creatinine, Ser: 1 mg/dL (ref 0.61–1.24)
Glucose, Bld: 109 mg/dL — ABNORMAL HIGH (ref 65–99)
HCT: 43 % (ref 39.0–52.0)
HEMOGLOBIN: 14.6 g/dL (ref 13.0–17.0)
Potassium: 3.9 mmol/L (ref 3.5–5.1)
SODIUM: 140 mmol/L (ref 135–145)
TCO2: 26 mmol/L (ref 0–100)

## 2016-06-21 LAB — I-STAT TROPONIN, ED
Troponin i, poc: 0 ng/mL (ref 0.00–0.08)
Troponin i, poc: 0 ng/mL (ref 0.00–0.08)

## 2016-06-21 MED ORDER — MORPHINE SULFATE (PF) 4 MG/ML IV SOLN
4.0000 mg | Freq: Once | INTRAVENOUS | Status: AC
  Administered 2016-06-21: 4 mg via INTRAVENOUS
  Filled 2016-06-21: qty 1

## 2016-06-21 NOTE — ED Triage Notes (Addendum)
Pt in from [facility] & was having an appt & had a witnessed seizure lasting 5 mins with posturing, hx of the same, pt lamictal, Seroquil, prozac, & Atenolol, pt postictal upon EMS, no tongue injury, no incontinence, pt laying during seizure, pt at baseline upon arrival to ED, pt c/o mid radiating CP to the L arm & L jaw, A&O x4, pt rcvd 5 mg of Versed pta

## 2016-06-21 NOTE — ED Notes (Signed)
Blankets placed on side rails for pt safety

## 2016-06-21 NOTE — ED Provider Notes (Signed)
[name] DEPT Provider Note   CSN: [id number] Arrival date & time: 06/21/16  1531     History   Chief Complaint Chief Complaint  Patient presents with  . Seizures    HPI [name] [name] is a 36 y.o. male.  HPI patient reportedly had seizure while at his psychiatrist's office immediately prior to coming here. He also reports that he's had chest pain radiating to jaw and the left arm onset 2 PM today with feeling of generalized malaise. Nothing makes symptoms better or worse. He denies noncompliance with seizure medication.. No nausea sweatiness or shortness of breath.  Past Medical History:  Diagnosis Date  . Behcet's disease ([place])   . Bipolar disorder ([place])   . Hypertension   . Migraine   . Seizures ([place])    last experienced a seizure x 3 years ago  . Vitamin D deficiency     Patient Active Problem List   Diagnosis Date Noted  . Peripheral vertigo 07/17/2015  . Vertigo 07/17/2015  . Intractable vomiting 07/17/2015  . Diplopia   . Cellulitis and abscess of trunk-right chest wall 05/08/2013  . Dehydration 02/15/2013  . Behcet's disease ([place]) 02/15/2013  . Recurrent vomiting 02/15/2013  . Encounter for therapeutic drug monitoring 06/14/2012  . History of oral aphthous ulcers 10/19/2010  . BIPOLAR DISORDER UNSPECIFIED 06/18/2008  . Essential hypertension 06/18/2008  . SYNCOPE AND COLLAPSE 06/18/2008  . Epilepsy ([place]) 06/18/2008    Past Surgical History:  Procedure Laterality Date  . APPENDECTOMY    . FRACTURE SURGERY Right    Arm  . maxilofacial    . NISSEN FUNDOPLICATION         Home Medications    Prior to Admission medications   Medication Sig Start Date End Date Taking? Authorizing Provider  Apremilast (OTEZLA) 30 MG TABS Take 30 mg by mouth 2 (two) times daily.     Historical Provider, MD  atenolol (TENORMIN) 50 MG tablet Take 1 tablet (50 mg total) by mouth daily. Patient taking differently: Take 50 mg by mouth 2 (two) times daily.  02/16/13    Robbie Lis, MD  clonazePAM (KLONOPIN) 0.5 MG tablet Take 1 tablet (0.5 mg total) by mouth 3 (three) times daily. For anxiety 02/16/13   Robbie Lis, MD  Eszopiclone 3 MG TABS Take 3 mg by mouth at bedtime. Lunesta 07/24/13   Historical Provider, MD  FLUoxetine (PROZAC) 10 MG tablet Take 10 mg by mouth daily.    Historical Provider, MD  FLUoxetine (PROZAC) 20 MG tablet Take 20 mg by mouth daily.    Historical Provider, MD  folic acid (FOLVITE) 1 MG tablet Take 1 mg by mouth daily.  04/07/13   Historical Provider, MD  lamoTRIgine (LAMICTAL) 200 MG tablet Take 1 tablet (200 mg total) by mouth 2 (two) times daily. 04/27/16   Ward Givens, NP  Methotrexate Sodium (METHOTREXATE, PF,) 50 MG/2ML injection Inject 25 mg into the skin every Sunday.    Historical Provider, MD  QUEtiapine (SEROQUEL XR) 300 MG 24 hr tablet Take 300 mg by mouth at bedtime. 07/05/15   Historical Provider, MD    Family History Family History  Problem Relation Age of Onset  . Lung cancer Paternal Grandfather   . Other Father     BPPV  Mother had MI age 51  Social History Social History  Substance Use Topics  . Smoking status: Never Smoker  . Smokeless tobacco: Never Used  . Alcohol use No  Denies drug use  Allergies   Patient has no known allergies.   Review of Systems Review of Systems  Constitutional: Negative.   HENT: Negative.   Respiratory: Positive for chest tightness.   Cardiovascular: Negative.   Gastrointestinal: Negative.   Musculoskeletal: Negative.   Skin: Negative.   Neurological: Positive for seizures.  Psychiatric/Behavioral: Negative.   All other systems reviewed and are negative.    Physical Exam Updated Vital Signs BP 110/79   Pulse 84   Temp 97.5 F (36.4 C) (Oral)   Resp (!) 23   Ht 6' (1.829 m)   Wt 230 lb (104.3 kg)   SpO2 94%   BMI 31.19 kg/m   Physical Exam  Constitutional: He appears well-developed and well-nourished.  HENT:  Head: Normocephalic and atraumatic.    Eyes: Conjunctivae are normal. Pupils are equal, round, and reactive to light.  Neck: Neck supple. No tracheal deviation present. No thyromegaly present.  Cardiovascular: Normal rate and regular rhythm.   No murmur heard. Pulmonary/Chest: Effort normal and breath sounds normal.  Abdominal: Soft. Bowel sounds are normal. He exhibits no distension. There is no tenderness.  Musculoskeletal: Normal range of motion. He exhibits no edema or tenderness.  Neurological: He is alert. Coordination normal.  Skin: Skin is warm and dry. No rash noted.  Psychiatric: He has a normal mood and affect.  Nursing note and vitals reviewed.    ED Treatments / Results  Labs (all labs ordered are listed, but only abnormal results are displayed) Labs Reviewed  I-STAT CHEM 8, ED - Abnormal; Notable for the following:       Result Value   Glucose, Bld 109 (*)    All other components within normal limits  CBC WITH DIFFERENTIAL/PLATELET  I-STAT TROPOININ, ED    EKG  EKG Interpretation  Date/Time:  Wednesday June 21 2016 15:34:35 EDT Ventricular Rate:  95 PR Interval:    QRS Duration: 93 QT Interval:  363 QTC Calculation: 457 R Axis:   67 Text Interpretation:  Sinus rhythm Probable inferior infarct, age indeterminate No significant change since last tracing Confirmed by Winfred Leeds  MD, Nahomy Limburg [phone]) on 06/21/2016 3:39:53 PM      Chest x-ray viewed by me Radiology Dg Chest Port 1 View  Result Date: 06/21/2016 CLINICAL DATA:  Chest pain. EXAM: PORTABLE CHEST 1 VIEW COMPARISON:  07/02/2012 FINDINGS: Numerous leads and wires project over the chest. Midline trachea. Normal heart size for level of inspiration. No pleural effusion or pneumothorax. Low lung volumes with resultant pulmonary interstitial prominence. No lobar consolidation. IMPRESSION: Low lung volumes, without acute disease. Electronically Signed   By: Abigail Miyamoto M.D.   On: 06/21/2016 16:19    Procedures Procedures (including critical care  time)  Medications Ordered in ED Medications  morphine 4 MG/ML injection 4 mg (4 mg Intravenous Given 06/21/16 1630)   Results for orders placed or performed during the hospital encounter of 06/21/16  CBC with Differential/Platelet  Result Value Ref Range   WBC 7.1 4.0 - 10.5 K/uL   RBC 4.61 4.22 - 5.81 MIL/uL   Hemoglobin 14.2 13.0 - 17.0 g/dL   HCT 42.6 39.0 - 52.0 %   MCV 92.4 78.0 - 100.0 fL   MCH 30.8 26.0 - 34.0 pg   MCHC 33.3 30.0 - 36.0 g/dL   RDW 12.7 11.5 - 15.5 %   Platelets 185 150 - 400 K/uL   Neutrophils Relative % 77 %   Neutro Abs 5.4 1.7 - 7.7 K/uL   Lymphocytes Relative 15 %  Lymphs Abs 1.1 0.7 - 4.0 K/uL   Monocytes Relative 6 %   Monocytes Absolute 0.4 0.1 - 1.0 K/uL   Eosinophils Relative 2 %   Eosinophils Absolute 0.1 0.0 - 0.7 K/uL   Basophils Relative 0 %   Basophils Absolute 0.0 0.0 - 0.1 K/uL  I-stat chem 8, ed  Result Value Ref Range   Sodium 140 135 - 145 mmol/L   Potassium 3.9 3.5 - 5.1 mmol/L   Chloride 105 101 - 111 mmol/L   BUN 19 6 - 20 mg/dL   Creatinine, Ser 1.00 0.61 - 1.24 mg/dL   Glucose, Bld 109 (H) 65 - 99 mg/dL   Calcium, Ion 1.16 1.15 - 1.40 mmol/L   TCO2 26 0 - 100 mmol/L   Hemoglobin 14.6 13.0 - 17.0 g/dL   HCT 43.0 39.0 - 52.0 %  I-stat troponin, ED  Result Value Ref Range   Troponin i, poc 0.00 0.00 - 0.08 ng/mL   Comment 3           Dg Chest Port 1 View  Result Date: 06/21/2016 CLINICAL DATA:  Chest pain. EXAM: PORTABLE CHEST 1 VIEW COMPARISON:  07/02/2012 FINDINGS: Numerous leads and wires project over the chest. Midline trachea. Normal heart size for level of inspiration. No pleural effusion or pneumothorax. Low lung volumes with resultant pulmonary interstitial prominence. No lobar consolidation. IMPRESSION: Low lung volumes, without acute disease. Electronically Signed   By: Abigail Miyamoto M.D.   On: 06/21/2016 16:19    Initial Impression / Assessment and Plan / ED Course  I have reviewed the triage vital signs and  the nursing notes.  Pertinent labs & imaging results that were available during my care of the patient were reviewed by me and considered in my medical decision making (see chart for details).     6:50 PM pain is improved after treatment with intravenous morphine. He is requesting additional pain medicine. Additional IV morphine ordered. Heart Score equals 2. Patient signed out to Dr Hillard Danker at550 pm  Final Clinical Impressions(s) / ED Diagnoses  Dx #1 seizure disorder 2 atypical chest pain Final diagnoses:  None    New Prescriptions New Prescriptions   No medications on file     Orlie Dakin, MD 06/21/16 1755

## 2016-06-21 NOTE — Discharge Instructions (Signed)
Follow up with your doctor, continue your current medications

## 2016-06-28 ENCOUNTER — Telehealth: Payer: Self-pay | Admitting: Neurology

## 2016-06-28 NOTE — Telephone Encounter (Signed)
Dr Jannifer Franklin- ok to work pt in?

## 2016-06-28 NOTE — Telephone Encounter (Signed)
Patient seen @ [facility] ED on 06-21-16 and needs follow up appointment with Dr. Jannifer Franklin for seizures soon.

## 2016-06-29 NOTE — Telephone Encounter (Signed)
Called pt. Scheduled f/u for 07/06/16 at 12pm, check in 1130am. Pt verbalized understanding.   Offered appt on 4/2 and 4/3 at 730am. Pt declined. He will be out of town for the holiday those days.

## 2016-07-06 ENCOUNTER — Ambulatory Visit (INDEPENDENT_AMBULATORY_CARE_PROVIDER_SITE_OTHER): Payer: Medicare Other | Admitting: Neurology

## 2016-07-06 ENCOUNTER — Encounter: Payer: Self-pay | Admitting: Neurology

## 2016-07-06 VITALS — BP 125/88 | HR 94 | Ht 72.0 in | Wt 239.0 lb

## 2016-07-06 DIAGNOSIS — G40909 Epilepsy, unspecified, not intractable, without status epilepticus: Secondary | ICD-10-CM | POA: Diagnosis not present

## 2016-07-06 DIAGNOSIS — R400 Somnolence: Secondary | ICD-10-CM | POA: Diagnosis not present

## 2016-07-06 DIAGNOSIS — M352 Behcet's disease: Secondary | ICD-10-CM | POA: Diagnosis not present

## 2016-07-06 MED ORDER — LAMOTRIGINE 25 MG PO TABS: ORAL_TABLET | ORAL | 5 refills | Status: DC

## 2016-07-06 NOTE — Patient Instructions (Signed)
   We will set up a spinal tap to evaluate why you are not feeling well.

## 2016-07-06 NOTE — Progress Notes (Signed)
Reason for visit: Seizures  [name] [name] is an 36 y.o. male  History of present illness:  [name] [name] is a 36 year old right-handed white male with a history of Behcet's syndrome and a history of seizures. The patient has recently been to the emergency room on 06/21/2016 with a seizure event. The patient had not missed any doses of his Lamictal. He is on 200 mg twice daily. The patient has no recollection of the actual seizure event, he had urinary incontinence with the episode. The patient claims that since November 2017 he has not felt well. He feels slow cognitively, he has difficulty controlling the right arm and right leg. He is not able to process information normally. He has undergone MRI of the brain that did not show any abnormalities or changes from prior study. The patient denies any significant balance issues or falls. He returns to the office today for an evaluation.   Past Medical History:  Diagnosis Date  . Behcet's disease ([place])   . Bipolar disorder ([place])   . Hypertension   . Migraine   . Seizures ([place])    last experienced a seizure x 3 years ago  . Vitamin D deficiency     Past Surgical History:  Procedure Laterality Date  . APPENDECTOMY    . FRACTURE SURGERY Right    Arm  . maxilofacial    . NISSEN FUNDOPLICATION      Family History  Problem Relation Age of Onset  . Lung cancer Paternal Grandfather   . Other Father     BPPV    Social history:  reports that he has never smoked. He has never used smokeless tobacco. He reports that he does not drink alcohol or use drugs.   No Known Allergies  Medications:  Prior to Admission medications   Medication Sig Start Date End Date Taking? Authorizing Provider  Apremilast (OTEZLA) 30 MG TABS Take 30 mg by mouth 2 (two) times daily.    Yes Historical Provider, MD  atenolol (TENORMIN) 50 MG tablet Take 1 tablet (50 mg total) by mouth daily. Patient taking differently: Take 50 mg by mouth 2 (two) times daily.   02/16/13  Yes Robbie Lis, MD  clonazePAM (KLONOPIN) 0.5 MG tablet Take 1 tablet (0.5 mg total) by mouth 3 (three) times daily. For anxiety 02/16/13  Yes Robbie Lis, MD  ergocalciferol (VITAMIN D2) 50000 units capsule Take 50,000 Units by mouth every Wednesday.   Yes Historical Provider, MD  eszopiclone (LUNESTA) 2 MG TABS tablet Take 2 mg by mouth at bedtime. Lunesta 07/24/13  Yes Historical Provider, MD  FLUoxetine (PROZAC) 20 MG tablet Take 20 mg by mouth 3 (three) times daily.    Yes Historical Provider, MD  folic acid (FOLVITE) 1 MG tablet Take 1 mg by mouth daily.  04/07/13  Yes Historical Provider, MD  lamoTRIgine (LAMICTAL) 200 MG tablet Take 1 tablet (200 mg total) by mouth 2 (two) times daily. 04/27/16  Yes Ward Givens, NP  Melatonin 5 MG TABS Take 5 mg by mouth at bedtime.   Yes Historical Provider, MD  Methotrexate Sodium (METHOTREXATE, PF,) 50 MG/2ML injection Inject 25 mg into the skin every Sunday.   Yes Historical Provider, MD  QUEtiapine (SEROQUEL XR) 300 MG 24 hr tablet Take 300 mg by mouth at bedtime. 07/05/15  Yes Historical Provider, MD    ROS:  Out of a complete 14 system review of symptoms, the patient complains only of the following symptoms, and all  other reviewed systems are negative.  Joint pain, joint swelling Skin wounds, rash Seizures, weakness Depression, anxiety  Blood pressure 125/88, pulse 94, height 6' (1.829 m), weight 239 lb (108.4 kg).  Physical Exam  General: The patient is alert and cooperative at the time of the examination. The patient is moderately obese.  Skin: No significant peripheral edema is noted.   Neurologic Exam  Mental status: The patient is alert and oriented x 3 at the time of the examination. The patient has apparent normal recent and remote memory, with an apparently normal attention span and concentration ability. The patient appears to be cognitively slow, he will repeat himself during the conversation in the  office.   Cranial nerves: Facial symmetry is present. Speech is normal, no aphasia or dysarthria is noted. Extraocular movements are full. Visual fields are full.  Motor: The patient has good strength in all 4 extremities.  Sensory examination: Soft touch sensation is symmetric on the face, arms, and legs.  Coordination: The patient has good finger-nose-finger and heel-to-shin bilaterally.  Gait and station: The patient has a normal gait. Tandem gait is normal. Romberg is negative. No drift is seen.  Reflexes: Deep tendon reflexes are symmetric.   MRI brain 02/06/16:  IMPRESSION:    This MRI of the brain with and without contrast shows the following: 1.   Brain parenchyma appears normal before and after contrast administration. 2.   Mild chronic left maxillary sinusitis 3.   There are no acute findings and there has been no change when compared to the 07/16/2015 MRI.  * MRI scan images were reviewed online. I agree with the written report.    Assessment/Plan:  1. History of seizures, recent recurrence  2. History of Behcet's syndrome  The patient has had an alteration in his mental status since November 2017. Behcet's disease can in some cases result in a chronic confusional syndrome associated with a meningoencephalitis that may in some cases progress on to dementia. The onset of a recent seizure along with the altered mental status may indicate an increase in the underlying activity associated with Behcet's syndrome. The patient will be increased on his Lamictal working up to 250 mg twice daily, he will be set up for a lumbar puncture to look for evidence of a meningoencephalitis. He is followed through [facility] for his Behcet's disease. He will follow-up for his routine scheduled appointment in July 2018.  Jill Alexanders MD 07/06/2016 12:44 PM  [facility] [address] [place], [place] [address]  Phone [phone] Fax [phone]

## 2016-07-07 ENCOUNTER — Observation Stay (HOSPITAL_COMMUNITY)
Admission: EM | Admit: 2016-07-07 | Discharge: 2016-07-08 | Disposition: A | Discharge status: Home / Self Care | Payer: Medicare Other | Attending: Internal Medicine | Admitting: Internal Medicine

## 2016-07-07 ENCOUNTER — Emergency Department (HOSPITAL_COMMUNITY): Payer: Medicare Other

## 2016-07-07 ENCOUNTER — Encounter (HOSPITAL_COMMUNITY): Payer: Self-pay | Admitting: Emergency Medicine

## 2016-07-07 DIAGNOSIS — Z9889 Other specified postprocedural states: Secondary | ICD-10-CM | POA: Insufficient documentation

## 2016-07-07 DIAGNOSIS — E876 Hypokalemia: Secondary | ICD-10-CM | POA: Insufficient documentation

## 2016-07-07 DIAGNOSIS — G40909 Epilepsy, unspecified, not intractable, without status epilepticus: Secondary | ICD-10-CM

## 2016-07-07 DIAGNOSIS — Z820 Family history of epilepsy and other diseases of the nervous system: Secondary | ICD-10-CM | POA: Insufficient documentation

## 2016-07-07 DIAGNOSIS — E559 Vitamin D deficiency, unspecified: Secondary | ICD-10-CM | POA: Insufficient documentation

## 2016-07-07 DIAGNOSIS — R739 Hyperglycemia, unspecified: Secondary | ICD-10-CM | POA: Diagnosis not present

## 2016-07-07 DIAGNOSIS — Z9049 Acquired absence of other specified parts of digestive tract: Secondary | ICD-10-CM | POA: Diagnosis not present

## 2016-07-07 DIAGNOSIS — R4182 Altered mental status, unspecified: Secondary | ICD-10-CM

## 2016-07-07 DIAGNOSIS — Z888 Allergy status to other drugs, medicaments and biological substances status: Secondary | ICD-10-CM | POA: Insufficient documentation

## 2016-07-07 DIAGNOSIS — M352 Behcet's disease: Secondary | ICD-10-CM | POA: Diagnosis not present

## 2016-07-07 DIAGNOSIS — G9340 Encephalopathy, unspecified: Secondary | ICD-10-CM | POA: Diagnosis not present

## 2016-07-07 DIAGNOSIS — N289 Disorder of kidney and ureter, unspecified: Secondary | ICD-10-CM | POA: Insufficient documentation

## 2016-07-07 DIAGNOSIS — W19XXXA Unspecified fall, initial encounter: Secondary | ICD-10-CM | POA: Insufficient documentation

## 2016-07-07 DIAGNOSIS — R401 Stupor: Secondary | ICD-10-CM | POA: Diagnosis not present

## 2016-07-07 DIAGNOSIS — F319 Bipolar disorder, unspecified: Secondary | ICD-10-CM | POA: Diagnosis not present

## 2016-07-07 DIAGNOSIS — I10 Essential (primary) hypertension: Secondary | ICD-10-CM | POA: Diagnosis not present

## 2016-07-07 DIAGNOSIS — S199XXA Unspecified injury of neck, initial encounter: Secondary | ICD-10-CM | POA: Insufficient documentation

## 2016-07-07 DIAGNOSIS — E860 Dehydration: Secondary | ICD-10-CM | POA: Diagnosis not present

## 2016-07-07 DIAGNOSIS — S0990XA Unspecified injury of head, initial encounter: Secondary | ICD-10-CM | POA: Diagnosis not present

## 2016-07-07 DIAGNOSIS — Z801 Family history of malignant neoplasm of trachea, bronchus and lung: Secondary | ICD-10-CM | POA: Insufficient documentation

## 2016-07-07 LAB — COMPREHENSIVE METABOLIC PANEL
ALK PHOS: 79 U/L (ref 38–126)
ALT: 20 U/L (ref 17–63)
AST: 31 U/L (ref 15–41)
Albumin: 4.2 g/dL (ref 3.5–5.0)
Anion gap: 10 (ref 5–15)
BUN: 18 mg/dL (ref 6–20)
CO2: 21 mmol/L — AB (ref 22–32)
CREATININE: 1.31 mg/dL — AB (ref 0.61–1.24)
Calcium: 8.9 mg/dL (ref 8.9–10.3)
Chloride: 109 mmol/L (ref 101–111)
GFR calc Af Amer: >60 mL/min (ref >60)
Glucose, Bld: 138 mg/dL — ABNORMAL HIGH (ref 65–99)
Potassium: 3.7 mmol/L (ref 3.5–5.1)
SODIUM: 140 mmol/L (ref 135–145)
Total Bilirubin: 0.5 mg/dL (ref 0.3–1.2)
Total Protein: 7 g/dL (ref 6.5–8.1)

## 2016-07-07 LAB — CBC WITH DIFFERENTIAL/PLATELET
Basophils Absolute: 0 10*3/uL (ref 0.0–0.1)
Basophils Relative: 0 %
EOS ABS: 0.2 10*3/uL (ref 0.0–0.7)
EOS PCT: 3 %
HCT: 40.1 % (ref 39.0–52.0)
Hemoglobin: 13.6 g/dL (ref 13.0–17.0)
Lymphocytes Relative: 39 %
Lymphs Abs: 2.4 10*3/uL (ref 0.7–4.0)
MCH: 30.5 pg (ref 26.0–34.0)
MCHC: 33.9 g/dL (ref 30.0–36.0)
MCV: 89.9 fL (ref 78.0–100.0)
MONOS PCT: 9 %
Monocytes Absolute: 0.6 10*3/uL (ref 0.1–1.0)
Neutro Abs: 3.1 10*3/uL (ref 1.7–7.7)
Neutrophils Relative %: 49 %
PLATELETS: 159 10*3/uL (ref 150–400)
RBC: 4.46 MIL/uL (ref 4.22–5.81)
RDW: 12.5 % (ref 11.5–15.5)
WBC: 6.3 10*3/uL (ref 4.0–10.5)

## 2016-07-07 LAB — URINALYSIS, ROUTINE W REFLEX MICROSCOPIC
BILIRUBIN URINE: NEGATIVE
Bilirubin Urine: NEGATIVE
Glucose, UA: NEGATIVE mg/dL
Glucose, UA: NEGATIVE mg/dL
HGB URINE DIPSTICK: NEGATIVE
HGB URINE DIPSTICK: NEGATIVE
Ketones, ur: NEGATIVE mg/dL
Ketones, ur: NEGATIVE mg/dL
LEUKOCYTES UA: NEGATIVE
Leukocytes, UA: NEGATIVE
Nitrite: NEGATIVE
Nitrite: NEGATIVE
PROTEIN: NEGATIVE mg/dL
PROTEIN: NEGATIVE mg/dL
SPECIFIC GRAVITY, URINE: 1.027 (ref 1.005–1.030)
Specific Gravity, Urine: 1.026 (ref 1.005–1.030)
pH: 5 (ref 5.0–8.0)
pH: 5 (ref 5.0–8.0)

## 2016-07-07 LAB — RAPID URINE DRUG SCREEN, HOSP PERFORMED
Amphetamines: NOT DETECTED
BARBITURATES: NOT DETECTED
Benzodiazepines: POSITIVE — AB
COCAINE: NOT DETECTED
Opiates: NOT DETECTED
Tetrahydrocannabinol: NOT DETECTED

## 2016-07-07 LAB — ETHANOL

## 2016-07-07 LAB — CBG MONITORING, ED: GLUCOSE-CAPILLARY: 121 mg/dL — AB (ref 65–99)

## 2016-07-07 MED ORDER — MELATONIN 5 MG PO TABS: 5.0000 mg | ORAL_TABLET | Freq: Every day | ORAL | Status: DC

## 2016-07-07 MED ORDER — APREMILAST 30 MG PO TABS: 30.0000 mg | ORAL_TABLET | Freq: Two times a day (BID) | ORAL | Status: DC

## 2016-07-07 MED ORDER — ONDANSETRON HCL 4 MG PO TABS: 4.0000 mg | ORAL_TABLET | Freq: Four times a day (QID) | ORAL | Status: DC | PRN

## 2016-07-07 MED ORDER — BISACODYL 10 MG RE SUPP: 10.0000 mg | Freq: Every day | RECTAL | Status: DC | PRN

## 2016-07-07 MED ORDER — LORAZEPAM 2 MG/ML IJ SOLN
1.0000 mg | Freq: Once | INTRAMUSCULAR | Status: AC
  Administered 2016-07-07: 1 mg via INTRAVENOUS

## 2016-07-07 MED ORDER — DEXTROSE-NACL 5-0.45 % IV SOLN
INTRAVENOUS | Status: DC
  Administered 2016-07-07: 23:00:00 via INTRAVENOUS

## 2016-07-07 MED ORDER — FLUOXETINE HCL 20 MG PO CAPS
20.0000 mg | ORAL_CAPSULE | Freq: Three times a day (TID) | ORAL | Status: DC
  Administered 2016-07-07 – 2016-07-08 (×2): 20 mg via ORAL
  Filled 2016-07-07 (×2): qty 1

## 2016-07-07 MED ORDER — SODIUM CHLORIDE 0.9 % IV BOLUS (SEPSIS): 1000.0000 mL | Freq: Once | INTRAVENOUS | Status: DC

## 2016-07-07 MED ORDER — SODIUM CHLORIDE 0.9 % IV BOLUS (SEPSIS)
1000.0000 mL | Freq: Once | INTRAVENOUS | Status: AC
  Administered 2016-07-07: 1000 mL via INTRAVENOUS

## 2016-07-07 MED ORDER — ONDANSETRON HCL 4 MG/2ML IJ SOLN
4.0000 mg | Freq: Four times a day (QID) | INTRAMUSCULAR | Status: DC | PRN
  Administered 2016-07-07 – 2016-07-08 (×2): 4 mg via INTRAVENOUS
  Filled 2016-07-07 (×2): qty 2

## 2016-07-07 MED ORDER — METHOTREXATE SODIUM CHEMO INJECTION 50 MG/2ML: 25.0000 mg | INTRAMUSCULAR | Status: DC

## 2016-07-07 MED ORDER — ATENOLOL 25 MG PO TABS: 50.0000 mg | ORAL_TABLET | Freq: Every day | ORAL | Status: DC

## 2016-07-07 MED ORDER — HEPARIN SODIUM (PORCINE) 5000 UNIT/ML IJ SOLN
5000.0000 [IU] | Freq: Three times a day (TID) | INTRAMUSCULAR | Status: DC
  Administered 2016-07-07 – 2016-07-08 (×2): 5000 [IU] via SUBCUTANEOUS
  Filled 2016-07-07 (×2): qty 1

## 2016-07-07 MED ORDER — ACETAMINOPHEN 650 MG RE SUPP: 650.0000 mg | Freq: Four times a day (QID) | RECTAL | Status: DC | PRN

## 2016-07-07 MED ORDER — VITAMIN D (ERGOCALCIFEROL) 1.25 MG (50000 UNIT) PO CAPS: 50000.0000 [IU] | ORAL_CAPSULE | ORAL | Status: DC

## 2016-07-07 MED ORDER — FOLIC ACID 1 MG PO TABS
1.0000 mg | ORAL_TABLET | Freq: Every day | ORAL | Status: DC
  Administered 2016-07-08: 1 mg via ORAL
  Filled 2016-07-07: qty 1

## 2016-07-07 MED ORDER — ACETAMINOPHEN 325 MG PO TABS
650.0000 mg | ORAL_TABLET | Freq: Four times a day (QID) | ORAL | Status: DC | PRN
  Administered 2016-07-08: 650 mg via ORAL
  Filled 2016-07-07: qty 2

## 2016-07-07 MED ORDER — ONDANSETRON HCL 4 MG/2ML IJ SOLN
INTRAMUSCULAR | Status: AC
  Administered 2016-07-07: 4 mg
  Filled 2016-07-07: qty 2

## 2016-07-07 MED ORDER — LAMOTRIGINE 100 MG PO TABS
200.0000 mg | ORAL_TABLET | Freq: Two times a day (BID) | ORAL | Status: DC
  Administered 2016-07-07 – 2016-07-08 (×2): 200 mg via ORAL
  Filled 2016-07-07 (×2): qty 2

## 2016-07-07 NOTE — ED Notes (Signed)
c collar removed by Laneta Simmers MD.

## 2016-07-07 NOTE — ED Triage Notes (Signed)
Per mother pt was "trying to walk in to be seen because he was sluggish." Mother alerted staff as pt fell at side of car. Pt lethargic, pale, and mumbling with log roll on spine board and c collar applied. Knott at bedside with triage noting pt tachycardiac and non responsive. Pt treated with ativan for seizure; hx of same. Mother continues to verbalizes pt fell yesterday.

## 2016-07-07 NOTE — H&P (Signed)
History and Physical    [name] [name]:[id number] DOB: 1980/10/01 DOA: 07/07/2016  Referring MD/NP/PA: EDP PCP: Gerrit Heck, MD  Outpatient Specialists: Dr. Floyde Parkins, neurology/ [facility] Patient coming from: Home  Chief Complaint: Decreased responsiveness  HPI: [name] [name] is a 36 y.o. male with medical history significant for but not limited to seizures and Behcet's disease. He was brought into [facility] ED by his mother because of altered mental status change with lethargy,difficulty arousing him, stupor, disorientation and slurred speech. No history of fever or chills. No history of nausea vomiting. No history of neck pains. He has had some alterations in mental status since November 2017 for which reason he saw Dr. Tilden [name] of neurology yesterday, 07/06/2016. 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 Head CT and cervical neck CT were negative. He received IV fluids and lorazepam injection. He was alert and responsive at time of my evaluation.   Review of Systems: As per HPI otherwise 10 point review of systems negative.   Past Medical History:  Diagnosis Date  . Behcet's disease ([place])   . Bipolar disorder ([place])   . Hypertension   . Migraine   . Seizures ([place])    last experienced a seizure x 3 years ago  . Vitamin D deficiency     Past Surgical History:  Procedure Laterality Date  . APPENDECTOMY    . FRACTURE SURGERY Right    Arm  . maxilofacial    . NISSEN FUNDOPLICATION       reports that he has never smoked. He has never used smokeless tobacco. He reports that he does not drink alcohol or use drugs.  Allergies  Allergen Reactions  . Actos [Pioglitazone] Rash    Family History  Problem  Relation Age of Onset  . Lung cancer Paternal Grandfather   . Other Father     BPPV     Prior to Admission medications   Medication Sig Start Date End Date Taking? Authorizing Provider  Apremilast (OTEZLA) 30 MG TABS Take 30 mg by mouth 2 (two) times daily.   Yes Historical Provider, MD  atenolol (TENORMIN) 50 MG tablet Take 1 tablet (50 mg total) by mouth daily. 02/16/13  Yes Robbie Lis, MD  clonazePAM (KLONOPIN) 0.5 MG tablet Take 1 tablet (0.5 mg total) by mouth 3 (three) times daily. For anxiety 02/16/13  Yes Robbie Lis, MD  ergocalciferol (VITAMIN D2) 50000 units capsule Take 50,000 Units by mouth every Wednesday.   Yes Historical Provider, MD  eszopiclone (LUNESTA) 2 MG TABS tablet Take 2 mg by mouth at bedtime. Lunesta   Yes Historical Provider, MD  FLUoxetine (PROZAC) 20 MG tablet Take 20 mg by mouth 3 (three) times daily.    Yes Historical Provider, MD  folic acid (FOLVITE) 1 MG tablet Take 1 mg by mouth daily.    Yes Historical Provider, MD  lamoTRIgine (LAMICTAL) 200 MG tablet Take 1 tablet (200 mg total) by mouth 2 (two) times daily. 04/27/16  Yes Ward Givens, NP  Melatonin 5 MG TABS Take 5 mg by mouth at bedtime.   Yes Historical Provider, MD  Methotrexate Sodium (METHOTREXATE, PF,) 50 MG/2ML injection Inject 25 mg into the skin every Sunday.   Yes Historical Provider, MD  QUEtiapine (SEROQUEL XR) 300  MG 24 hr tablet Take 300 mg by mouth at bedtime.   Yes Historical Provider, MD    Physical Exam: Vitals:   07/07/16 1501 07/07/16 1531 07/07/16 1601 07/07/16 1610  BP:  99/60 (!) 88/55 100/66  Pulse:  95 81 80  Resp:  14 14 16   Temp: 97.9 F (36.6 C)     TempSrc: Oral     SpO2:  99% 99% 100%  Weight:          Constitutional: NAD, calm, comfortable Vitals:   07/07/16 1501 07/07/16 1531 07/07/16 1601 07/07/16 1610  BP:  99/60 (!) 88/55 100/66  Pulse:  95 81 80  Resp:  14 14 16   Temp: 97.9 F (36.6 C)     TempSrc: Oral     SpO2:  99% 99% 100%  Weight:        Eyes: PERRL, lids and conjunctivae normal ENMT: Dry Mucous membranes. Posterior pharynx clear of any exudate or lesions.Normal dentition.  Neck: normal, supple, no masses, no thyromegaly Respiratory: clear to auscultation bilaterally, no wheezing, no crackles. Normal respiratory effort. No accessory muscle use.  Cardiovascular: Regular rate and rhythm, no murmurs / rubs / gallops. No extremity edema. 2+ pedal pulses. No carotid bruits.  Abdomen: no tenderness, no masses palpated. No hepatosplenomegaly. Bowel sounds positive.  Musculoskeletal: no clubbing / cyanosis. No joint deformity upper and lower extremities. Good ROM, no contractures. Normal muscle tone.  Skin: no rashes, lesions, ulcers. No induration Neurologic: Mildly lethargic, but answers questions appropriately. No acute focal deficit noted       Labs on Admission: I have personally reviewed following labs and imaging studies  CBC:  Recent Labs Lab 07/07/16 1455  WBC 6.3  NEUTROABS 3.1  HGB 13.6  HCT 40.1  MCV 89.9  PLT 093   Basic Metabolic Panel:  Recent Labs Lab 07/07/16 1455  NA 140  K 3.7  CL 109  CO2 21*  GLUCOSE 138*  BUN 18  CREATININE 1.31*  CALCIUM 8.9   GFR: Estimated Creatinine Clearance: 98.2 mL/min (A) (by C-G formula based on SCr of 1.31 mg/dL (H)). Liver Function Tests:  Recent Labs Lab 07/07/16 1455  AST 31  ALT 20  ALKPHOS 79  BILITOT 0.5  PROT 7.0  ALBUMIN 4.2   No results for input(s): LIPASE, AMYLASE in the last 168 hours. No results for input(s): AMMONIA in the last 168 hours. Coagulation Profile: No results for input(s): INR, PROTIME in the last 168 hours. Cardiac Enzymes: No results for input(s): CKTOTAL, CKMB, CKMBINDEX, TROPONINI in the last 168 hours. BNP (last 3 results) No results for input(s): PROBNP in the last 8760 hours. HbA1C: No results for input(s): HGBA1C in the last 72 hours. CBG:  Recent Labs Lab 07/07/16 1453  GLUCAP 121*   Lipid  Profile: No results for input(s): CHOL, HDL, LDLCALC, TRIG, CHOLHDL, LDLDIRECT in the last 72 hours. Thyroid Function Tests: No results for input(s): TSH, T4TOTAL, FREET4, T3FREE, THYROIDAB in the last 72 hours. Anemia Panel: No results for input(s): VITAMINB12, FOLATE, FERRITIN, TIBC, IRON, RETICCTPCT in the last 72 hours. Urine analysis:    Component Value Date/Time   COLORURINE YELLOW 07/07/2016 [place] 07/07/2016 1602   LABSPEC 1.026 07/07/2016 1602   PHURINE 5.0 07/07/2016 1602   GLUCOSEU NEGATIVE 07/07/2016 1602   HGBUR NEGATIVE 07/07/2016 1602   BILIRUBINUR NEGATIVE 07/07/2016 [place] 07/07/2016 1602   PROTEINUR NEGATIVE 07/07/2016 1602   UROBILINOGEN 1.0 08/15/2013 2011   NITRITE  NEGATIVE 07/07/2016 1602   LEUKOCYTESUR NEGATIVE 07/07/2016 1602   Sepsis Labs: @LABRCNTIP (procalcitonin:4,lacticidven:4) )No results found for this or any previous visit (from the past 240 hour(s)).   Radiological Exams on Admission: Ct Head Wo Contrast  Result Date: 07/07/2016 CLINICAL DATA:  [place] last night. Syncopal episode. Trauma to the head and neck. EXAM: CT HEAD WITHOUT CONTRAST CT CERVICAL SPINE WITHOUT CONTRAST TECHNIQUE: Multidetector CT imaging of the head and cervical spine was performed following the standard protocol without intravenous contrast. Multiplanar CT image reconstructions of the cervical spine were also generated. COMPARISON:  07/16/2015 FINDINGS: CT HEAD FINDINGS Brain: No evidence of malformation, atrophy, old or acute small or large vessel infarction, mass lesion, hemorrhage, hydrocephalus or extra-axial collection. No evidence of pituitary lesion. Vascular: No vascular calcification.  No hyperdense vessels. Skull: Normal.  No fracture or focal bone lesion. Sinuses/Orbits: Previous ORIF of facial fractures. No evidence of sinus inflammatory disease. Orbits negative. Other: None significant CT CERVICAL SPINE FINDINGS Alignment: Normal Skull  base and vertebrae: Normal Soft tissues and spinal canal: Normal Disc levels:  Normal Upper chest: Normal Other: None IMPRESSION: Head CT:  Normal.  No traumatic finding.  Old facial fractures. Cervical spine CT:  Normal examination.  No traumatic finding. Electronically Signed   By: Nelson Chimes M.D.   On: 07/07/2016 15:32   Ct Cervical Spine Wo Contrast  Result Date: 07/07/2016 CLINICAL DATA:  [place] last night. Syncopal episode. Trauma to the head and neck. EXAM: CT HEAD WITHOUT CONTRAST CT CERVICAL SPINE WITHOUT CONTRAST TECHNIQUE: Multidetector CT imaging of the head and cervical spine was performed following the standard protocol without intravenous contrast. Multiplanar CT image reconstructions of the cervical spine were also generated. COMPARISON:  07/16/2015 FINDINGS: CT HEAD FINDINGS Brain: No evidence of malformation, atrophy, old or acute small or large vessel infarction, mass lesion, hemorrhage, hydrocephalus or extra-axial collection. No evidence of pituitary lesion. Vascular: No vascular calcification.  No hyperdense vessels. Skull: Normal.  No fracture or focal bone lesion. Sinuses/Orbits: Previous ORIF of facial fractures. No evidence of sinus inflammatory disease. Orbits negative. Other: None significant CT CERVICAL SPINE FINDINGS Alignment: Normal Skull base and vertebrae: Normal Soft tissues and spinal canal: Normal Disc levels:  Normal Upper chest: Normal Other: None IMPRESSION: Head CT:  Normal.  No traumatic finding.  Old facial fractures. Cervical spine CT:  Normal examination.  No traumatic finding. Electronically Signed   By: Nelson Chimes M.D.   On: 07/07/2016 15:32    EKG: n/a  Assessment/Plan Principal Problem:   Altered mental status Active Problems:   Epilepsy ([place])   Dehydration  #1 altered mental status: Etiology unclear but suspect due to accidental overdose of benzodiazepine/dehydration Head CT negative   check UA Supportive care  #2 dehydration: IV fluid  supplementation Monitor renal function/electrolytes I/O monitoring  #3 seizure: Increase dose of Lamictal to 250 twice a day as recommended by neurology Consider neurology consult for possible LP if he doesn't improve -otherwise outpatient follow-up with neurology  #4 acute renal insufficiency: Likely due to dehydration Supportive care with IV fluid supplementation  #5 hyperglycemia: Denies previous history of diabetes-though there is allergy history for Actos Check A1c Sliding-scale insulin as needed    DVT prophylaxis: Heparin Code Status: Full Family Communication: [name] [name] at bedisde Disposition Plan: Home Consults called:  Admission status: obs / tele    OSEI-BONSU,Laquasia Pincus MD [facility] Pager [phone]  If 7PM-7AM, please contact night-coverage [email or web address] Password [email or web address]  07/07/2016, 5:13 PM

## 2016-07-07 NOTE — ED Provider Notes (Signed)
[facility] DEPT Provider Note   CSN: [id number] Arrival date & time: 07/07/16  1457     History   Chief Complaint Chief Complaint  Patient presents with  . Loss of Consciousness    HPI [name] [name] is a 36 y.o. male.  The history is provided by the patient and a relative.  Loss of Consciousness   This is a new problem. The current episode started less than 1 hour ago. The problem occurs constantly. The problem has been gradually improving. He lost consciousness for a period of less than one minute. The problem is associated with normal activity. Associated symptoms include malaise/fatigue, slurred speech and weakness. Associated symptoms comments: Collapsing to the ground.    Past Medical History:  Diagnosis Date  . Behcet's disease ([place])   . Bipolar disorder ([place])   . Hypertension   . Migraine   . Seizures ([place])    last experienced a seizure x 3 years ago  . Vitamin D deficiency     Patient Active Problem List   Diagnosis Date Noted  . Peripheral vertigo 07/17/2015  . Vertigo 07/17/2015  . Intractable vomiting 07/17/2015  . Diplopia   . Cellulitis and abscess of trunk-right chest wall 05/08/2013  . Dehydration 02/15/2013  . Behcet's disease ([place]) 02/15/2013  . Recurrent vomiting 02/15/2013  . Encounter for therapeutic drug monitoring 06/14/2012  . History of oral aphthous ulcers 10/19/2010  . BIPOLAR DISORDER UNSPECIFIED 06/18/2008  . Essential hypertension 06/18/2008  . SYNCOPE AND COLLAPSE 06/18/2008  . Epilepsy ([place]) 06/18/2008    Past Surgical History:  Procedure Laterality Date  . APPENDECTOMY    . FRACTURE SURGERY Right    Arm  . maxilofacial    . NISSEN FUNDOPLICATION         Home Medications    Prior to Admission medications   Medication Sig Start Date End Date Taking? Authorizing Provider  Apremilast (OTEZLA) 30 MG TABS Take 30 mg by mouth 2 (two) times daily.   Yes Historical Provider, MD  atenolol (TENORMIN) 50 MG tablet Take 1  tablet (50 mg total) by mouth daily. 02/16/13  Yes Robbie Lis, MD  clonazePAM (KLONOPIN) 0.5 MG tablet Take 1 tablet (0.5 mg total) by mouth 3 (three) times daily. For anxiety 02/16/13  Yes Robbie Lis, MD  ergocalciferol (VITAMIN D2) 50000 units capsule Take 50,000 Units by mouth every Wednesday.   Yes Historical Provider, MD  eszopiclone (LUNESTA) 2 MG TABS tablet Take 2 mg by mouth at bedtime. Lunesta   Yes Historical Provider, MD  FLUoxetine (PROZAC) 20 MG tablet Take 20 mg by mouth 3 (three) times daily.    Yes Historical Provider, MD  folic acid (FOLVITE) 1 MG tablet Take 1 mg by mouth daily.    Yes Historical Provider, MD  lamoTRIgine (LAMICTAL) 200 MG tablet Take 1 tablet (200 mg total) by mouth 2 (two) times daily. 04/27/16  Yes Ward Givens, NP  Melatonin 5 MG TABS Take 5 mg by mouth at bedtime.   Yes Historical Provider, MD  Methotrexate Sodium (METHOTREXATE, PF,) 50 MG/2ML injection Inject 25 mg into the skin every Sunday.   Yes Historical Provider, MD  QUEtiapine (SEROQUEL XR) 300 MG 24 hr tablet Take 300 mg by mouth at bedtime.   Yes Historical Provider, MD    Family History Family History  Problem Relation Age of Onset  . Lung cancer Paternal Grandfather   . Other Father     BPPV    Social History Social  History  Substance Use Topics  . Smoking status: Never Smoker  . Smokeless tobacco: Never Used  . Alcohol use No     Allergies   Actos [pioglitazone]   Review of Systems Review of Systems  Constitutional: Positive for malaise/fatigue.  Cardiovascular: Positive for syncope.  Neurological: Positive for weakness.  All other systems reviewed and are negative.    Physical Exam Updated Vital Signs BP 100/66 (BP Location: Left Arm)   Pulse 80   Temp 97.9 F (36.6 C) (Oral)   Resp 16   Wt 239 lb (108.4 kg)   SpO2 100%   BMI 32.41 kg/m   Physical Exam  Constitutional: He appears well-developed and well-nourished. He appears lethargic. No distress.    HENT:  Head: Normocephalic and atraumatic.  Nose: Nose normal.  Eyes: Conjunctivae are normal.  Neck: Neck supple. No tracheal deviation present.  Cardiovascular: Normal rate, regular rhythm and normal heart sounds.   Pulmonary/Chest: Effort normal and breath sounds normal. No respiratory distress.  Abdominal: Soft. He exhibits no distension.  Neurological: He appears lethargic. He is disoriented. GCS eye subscore is 4. GCS verbal subscore is 4. GCS motor subscore is 6.  Skin: Skin is warm and dry.  Psychiatric: His affect is blunt. His speech is delayed and slurred. He is slowed.  Repetitive speech complaining of pain below his right ribs after taking medicines and "I don't feel well"  Vitals reviewed.    ED Treatments / Results  Labs (all labs ordered are listed, but only abnormal results are displayed) Labs Reviewed  RAPID URINE DRUG SCREEN, HOSP PERFORMED - Abnormal; Notable for the following:       Result Value   Benzodiazepines POSITIVE (*)    All other components within normal limits  COMPREHENSIVE METABOLIC PANEL - Abnormal; Notable for the following:    CO2 21 (*)    Glucose, Bld 138 (*)    Creatinine, Ser 1.31 (*)    All other components within normal limits  CBG MONITORING, ED - Abnormal; Notable for the following:    Glucose-Capillary 121 (*)    All other components within normal limits  ETHANOL  CBC WITH DIFFERENTIAL/PLATELET  URINALYSIS, ROUTINE W REFLEX MICROSCOPIC    EKG  EKG Interpretation None       Radiology Ct Head Wo Contrast  Result Date: 07/07/2016 CLINICAL DATA:  [place] last night. Syncopal episode. Trauma to the head and neck. EXAM: CT HEAD WITHOUT CONTRAST CT CERVICAL SPINE WITHOUT CONTRAST TECHNIQUE: Multidetector CT imaging of the head and cervical spine was performed following the standard protocol without intravenous contrast. Multiplanar CT image reconstructions of the cervical spine were also generated. COMPARISON:  07/16/2015 FINDINGS: CT  HEAD FINDINGS Brain: No evidence of malformation, atrophy, old or acute small or large vessel infarction, mass lesion, hemorrhage, hydrocephalus or extra-axial collection. No evidence of pituitary lesion. Vascular: No vascular calcification.  No hyperdense vessels. Skull: Normal.  No fracture or focal bone lesion. Sinuses/Orbits: Previous ORIF of facial fractures. No evidence of sinus inflammatory disease. Orbits negative. Other: None significant CT CERVICAL SPINE FINDINGS Alignment: Normal Skull base and vertebrae: Normal Soft tissues and spinal canal: Normal Disc levels:  Normal Upper chest: Normal Other: None IMPRESSION: Head CT:  Normal.  No traumatic finding.  Old facial fractures. Cervical spine CT:  Normal examination.  No traumatic finding. Electronically Signed   By: Nelson Chimes M.D.   On: 07/07/2016 15:32   Ct Cervical Spine Wo Contrast  Result Date: 07/07/2016 CLINICAL DATA:  [place] last night. Syncopal episode. Trauma to the head and neck. EXAM: CT HEAD WITHOUT CONTRAST CT CERVICAL SPINE WITHOUT CONTRAST TECHNIQUE: Multidetector CT imaging of the head and cervical spine was performed following the standard protocol without intravenous contrast. Multiplanar CT image reconstructions of the cervical spine were also generated. COMPARISON:  07/16/2015 FINDINGS: CT HEAD FINDINGS Brain: No evidence of malformation, atrophy, old or acute small or large vessel infarction, mass lesion, hemorrhage, hydrocephalus or extra-axial collection. No evidence of pituitary lesion. Vascular: No vascular calcification.  No hyperdense vessels. Skull: Normal.  No fracture or focal bone lesion. Sinuses/Orbits: Previous ORIF of facial fractures. No evidence of sinus inflammatory disease. Orbits negative. Other: None significant CT CERVICAL SPINE FINDINGS Alignment: Normal Skull base and vertebrae: Normal Soft tissues and spinal canal: Normal Disc levels:  Normal Upper chest: Normal Other: None IMPRESSION: Head CT:  Normal.  No  traumatic finding.  Old facial fractures. Cervical spine CT:  Normal examination.  No traumatic finding. Electronically Signed   By: Nelson Chimes M.D.   On: 07/07/2016 15:32    Procedures Procedures (including critical care time)  Medications Ordered in ED Medications  LORazepam (ATIVAN) injection 1 mg (1 mg Intravenous Given 07/07/16 1502)  sodium chloride 0.9 % bolus 1,000 mL (0 mLs Intravenous Stopped 07/07/16 1602)  sodium chloride 0.9 % bolus 1,000 mL (0 mLs Intravenous Stopped 07/07/16 1706)     Initial Impression / Assessment and Plan / ED Course  I have reviewed the triage vital signs and the nursing notes.  Pertinent labs & imaging results that were available during my care of the patient were reviewed by me and considered in my medical decision making (see chart for details).     36 year old male with history of bipolar, Bechet's disease, and remote seizures presents with lethargy from home. According to his mother he was falling around the house last night. When she brought him in today he was barely able to get himself into the car and when they pulled up to the parking lot to the emergency department he fell out and slumped onto the ground. On arrival he is very slowed with mydriasis bilaterally. He is minimally responsive on painful stimulation intermittently. There was a question of whether he had some subclinical seizure activity with eye twitching briefly where he was not speaking and was given 1 mg of Ativan. but he appears intoxicated. He does take sedating medications at home but denies overdose on medications, alcohol intake, or other drug use.  At this time the patient's mental status is slowly recovering but I'm unable to determine the cause of encephalopathy. Plan will be for admission to the hospital for monitoring to resolution on observation. Hospitalist was consulted for admission and will see the patient in the emergency department.   Final Clinical Impressions(s) / ED  Diagnoses   Final diagnoses:  Acute encephalopathy    New Prescriptions New Prescriptions   No medications on file     Leo Grosser, MD 07/07/16 [phone]

## 2016-07-07 NOTE — Progress Notes (Signed)
PHARMACIST - PHYSICIAN ORDER COMMUNICATION  CONCERNING: P&T Medication Policy on Herbal Medications  DESCRIPTION:  This patient's order for:  Melatonin  has been noted.  This product(s) is classified as an "herbal" or natural product. Due to a lack of definitive safety studies or FDA approval, nonstandard manufacturing practices, plus the potential risk of unknown drug-drug interactions while on inpatient medications, the Pharmacy and Therapeutics Committee does not permit the use of "herbal" or natural products of this type within [facility].   ACTION TAKEN: The pharmacy department is unable to verify this order at this time and your patient has been informed of this safety policy. Please reevaluate patient's clinical condition at discharge and address if the herbal or natural product(s) should be resumed at that time.   Thanks Dorrene German 07/07/2016 10:58 PM

## 2016-07-08 DIAGNOSIS — R400 Somnolence: Secondary | ICD-10-CM

## 2016-07-08 DIAGNOSIS — G9340 Encephalopathy, unspecified: Secondary | ICD-10-CM | POA: Diagnosis not present

## 2016-07-08 LAB — URINALYSIS, ROUTINE W REFLEX MICROSCOPIC
Bilirubin Urine: NEGATIVE
GLUCOSE, UA: NEGATIVE mg/dL
Hgb urine dipstick: NEGATIVE
KETONES UR: NEGATIVE mg/dL
Leukocytes, UA: NEGATIVE
NITRITE: NEGATIVE
PH: 5 (ref 5.0–8.0)
Protein, ur: NEGATIVE mg/dL
Specific Gravity, Urine: 1.005 (ref 1.005–1.030)

## 2016-07-08 LAB — CBC
HCT: 38.9 % — ABNORMAL LOW (ref 39.0–52.0)
HEMOGLOBIN: 12.8 g/dL — AB (ref 13.0–17.0)
MCH: 30.5 pg (ref 26.0–34.0)
MCHC: 32.9 g/dL (ref 30.0–36.0)
MCV: 92.8 fL (ref 78.0–100.0)
Platelets: 160 10*3/uL (ref 150–400)
RBC: 4.19 MIL/uL — ABNORMAL LOW (ref 4.22–5.81)
RDW: 12.8 % (ref 11.5–15.5)
WBC: 6.5 10*3/uL (ref 4.0–10.5)

## 2016-07-08 LAB — BASIC METABOLIC PANEL
Anion gap: 7 (ref 5–15)
BUN: 11 mg/dL (ref 6–20)
CHLORIDE: 110 mmol/L (ref 101–111)
CO2: 23 mmol/L (ref 22–32)
CREATININE: 1.14 mg/dL (ref 0.61–1.24)
Calcium: 8.3 mg/dL — ABNORMAL LOW (ref 8.9–10.3)
GFR calc Af Amer: >60 mL/min (ref >60)
GFR calc non Af Amer: >60 mL/min (ref >60)
Glucose, Bld: 95 mg/dL (ref 65–99)
Potassium: 3.1 mmol/L — ABNORMAL LOW (ref 3.5–5.1)
Sodium: 140 mmol/L (ref 135–145)

## 2016-07-08 LAB — GLUCOSE, CAPILLARY: GLUCOSE-CAPILLARY: 104 mg/dL — AB (ref 65–99)

## 2016-07-08 LAB — APTT: aPTT: 32 seconds (ref 24–36)

## 2016-07-08 LAB — PROTIME-INR
INR: 1.05
Prothrombin Time: 13.7 seconds (ref 11.4–15.2)

## 2016-07-08 LAB — HIV ANTIBODY (ROUTINE TESTING W REFLEX): HIV SCREEN 4TH GENERATION: NONREACTIVE

## 2016-07-08 MED ORDER — POTASSIUM CHLORIDE ER 10 MEQ PO TBCR: 10.0000 meq | EXTENDED_RELEASE_TABLET | Freq: Every day | ORAL | 0 refills | Status: DC

## 2016-07-08 NOTE — Discharge Summary (Signed)
[name] [name], is a 36 y.o. male  DOB 1981-03-17  MRN [id number].  Admission date:  07/07/2016  Admitting Physician  Benito Mccreedy, MD  Discharge Date:  07/08/2016   Primary MD  Gerrit Heck, MD  Recommendations for primary care physician for things to follow:   BMP -f/u renal function, electrolytes   Admission Diagnosis  Acute encephalopathy [G93.40]   Discharge Diagnosis  Acute encephalopathy [G93.40]    Principal Problem:   Altered mental status Active Problems:   Epilepsy ([place])   Dehydration      #1 altered mental status: Etiology unclear but suspect due to accidental overdose of benzodiazepine with dehydration Head CT negative  Neg UA Supportive care  #2 dehydration: IV fluid supplementation Monitor renal function/electrolytes - Potassium repletion I/O monitoring  #3 seizure: Increased dose of Lamictal to 250 twice a day as recommended by neurology Outpatient follow-up with neurology for possible LP as planned  #4 acute renal insufficiency: Likely due to dehydration Supportive care with IV fluid supplementation  #5 hyperglycemia: Denies previous history of diabetes-though there is allergy history for Actos Check A1c-5.4%  #6 Hypokalemia: Mild  Supplemental Potassium   Past Medical History:  Diagnosis Date  . Behcet's disease ([place])   . Bipolar disorder ([place])   . Hypertension   . Migraine   . Seizures ([place])    last experienced a seizure x 3 years ago  . Vitamin D deficiency     Past Surgical History:  Procedure Laterality Date  . APPENDECTOMY    . FRACTURE SURGERY Right    Arm  . maxilofacial    . NISSEN FUNDOPLICATION         HPI  from the history and physical done on the day of admission:     [name] [name] is a 36 y.o. male with medical history significant for but not limited to seizures and Behcet's disease. He was brought into [facility] ED by his mother because of altered mental status change with lethargy,difficulty arousing him, stupor, disorientation and slurred speech. No history of fever or chills. No history of nausea vomiting. No history of neck pains. He has had some alterations in mental status since November 2017 for which reason he saw Dr. Tilden Dome of neurology yesterday, 07/06/2016. 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 Head CT and cervical neck CT were negative. He received IV fluids and lorazepam injection. He was alert and responsive at time of my evaluation.      Hospital Course:  Pt was admitted for overnight observation with IV fluid hydration, monitoring of I/O's and renal function with electrolytes. UA was negative for uti. He improved navk to baseline at next morning and was educated about appropriate use of medication(s) Discharge Condition: stable  Follow UP - Neurologist, Dr Jannifer Franklin and specialist  at [facility] and Activity recommendation:  As advised  Discharge Instructions    Discharge Instructions    Call MD for:  difficulty breathing, headache or visual disturbances    Complete by:  As directed    Call MD for:  extreme fatigue    Complete by:  As directed    Call MD for:  persistant dizziness or light-headedness    Complete by:  As directed    Call MD for:  persistant nausea and vomiting    Complete by:  As directed    Call MD for:  redness, tenderness, or signs of infection (pain, swelling, redness, odor or green/yellow discharge around incision site)    Complete by:  As directed    Call MD for:  temperature >100.4    Complete by:  As directed    Diet - low sodium heart healthy     Complete by:  As directed    Discharge instructions    Complete by:  As directed    Fill script for increased dose of lamictal 250 mg twice daily given by Dr Jannifer Franklin. Schedule appointment with your specialist at [facility] for your behcet's disease.   Increase activity slowly    Complete by:  As directed         Discharge Medications     Allergies as of 07/08/2016      Reactions   Actos [pioglitazone] Rash      Medication List    STOP taking these medications   eszopiclone 2 MG Tabs tablet Commonly known as:  LUNESTA     TAKE these medications   atenolol 50 MG tablet Commonly known as:  TENORMIN Take 1 tablet (50 mg total) by mouth daily.   clonazePAM 0.5 MG tablet Commonly known as:  KLONOPIN Take 1 tablet (0.5 mg total) by mouth 3 (three) times daily. For anxiety   ergocalciferol 50000 units capsule Commonly known as:  VITAMIN D2 Take 50,000 Units by mouth every Wednesday.   FLUoxetine 20 MG tablet Commonly known as:  PROZAC Take 20 mg by mouth 3 (three) times daily.   folic acid 1 MG tablet Commonly known as:  FOLVITE Take 1 mg by mouth daily.   lamoTRIgine 200 MG tablet Commonly known as:  LAMICTAL Take 1 tablet (200 mg total) by mouth 2 (two) times daily.   Melatonin 5 MG Tabs Take 5 mg by mouth at bedtime.   methotrexate (PF) 50 MG/2ML injection Inject 25 mg into the skin every Sunday.   OTEZLA 30 MG Tabs Generic drug:  Apremilast Take 30 mg by mouth 2 (two) times daily.   potassium chloride 10 MEQ tablet Commonly known as:  K-DUR Take 1 tablet (10 mEq total) by mouth daily.   QUEtiapine 300 MG 24 hr tablet Commonly known as:  SEROQUEL XR Take 300 mg by mouth at bedtime.       Major procedures and Radiology Reports    Ct Head Wo Contrast  Result Date: 07/07/2016 CLINICAL DATA:  [place] last night. Syncopal episode. Trauma to the head and neck. EXAM: CT HEAD WITHOUT CONTRAST CT CERVICAL SPINE WITHOUT CONTRAST TECHNIQUE: Multidetector CT imaging  of the head and cervical spine was performed following the standard protocol without intravenous contrast. Multiplanar CT image reconstructions of the cervical spine were also generated. COMPARISON:  07/16/2015 FINDINGS: CT HEAD FINDINGS Brain: No evidence of malformation, atrophy, old or acute small or large vessel infarction, mass lesion, hemorrhage,  hydrocephalus or extra-axial collection. No evidence of pituitary lesion. Vascular: No vascular calcification.  No hyperdense vessels. Skull: Normal.  No fracture or focal bone lesion. Sinuses/Orbits: Previous ORIF of facial fractures. No evidence of sinus inflammatory disease. Orbits negative. Other: None significant CT CERVICAL SPINE FINDINGS Alignment: Normal Skull base and vertebrae: Normal Soft tissues and spinal canal: Normal Disc levels:  Normal Upper chest: Normal Other: None IMPRESSION: Head CT:  Normal.  No traumatic finding.  Old facial fractures. Cervical spine CT:  Normal examination.  No traumatic finding. Electronically Signed   By: Nelson Chimes M.D.   On: 07/07/2016 15:32   Ct Cervical Spine Wo Contrast  Result Date: 07/07/2016 CLINICAL DATA:  [place] last night. Syncopal episode. Trauma to the head and neck. EXAM: CT HEAD WITHOUT CONTRAST CT CERVICAL SPINE WITHOUT CONTRAST TECHNIQUE: Multidetector CT imaging of the head and cervical spine was performed following the standard protocol without intravenous contrast. Multiplanar CT image reconstructions of the cervical spine were also generated. COMPARISON:  07/16/2015 FINDINGS: CT HEAD FINDINGS Brain: No evidence of malformation, atrophy, old or acute small or large vessel infarction, mass lesion, hemorrhage, hydrocephalus or extra-axial collection. No evidence of pituitary lesion. Vascular: No vascular calcification.  No hyperdense vessels. Skull: Normal.  No fracture or focal bone lesion. Sinuses/Orbits: Previous ORIF of facial fractures. No evidence of sinus inflammatory disease. Orbits negative.  Other: None significant CT CERVICAL SPINE FINDINGS Alignment: Normal Skull base and vertebrae: Normal Soft tissues and spinal canal: Normal Disc levels:  Normal Upper chest: Normal Other: None IMPRESSION: Head CT:  Normal.  No traumatic finding.  Old facial fractures. Cervical spine CT:  Normal examination.  No traumatic finding. Electronically Signed   By: Nelson Chimes M.D.   On: 07/07/2016 15:32   Dg Chest Port 1 View  Result Date: 06/21/2016 CLINICAL DATA:  Chest pain. EXAM: PORTABLE CHEST 1 VIEW COMPARISON:  07/02/2012 FINDINGS: Numerous leads and wires project over the chest. Midline trachea. Normal heart size for level of inspiration. No pleural effusion or pneumothorax. Low lung volumes with resultant pulmonary interstitial prominence. No lobar consolidation. IMPRESSION: Low lung volumes, without acute disease. Electronically Signed   By: Abigail Miyamoto M.D.   On: 06/21/2016 16:19    Micro Results     No results found for this or any previous visit (from the past 240 hour(s)).     Today   Subjective    Gamaliel Charney today was seen on rounds. No fever orc hills, no headaches, no dizziness, back at baseline.       Patient has been seen and examined prior to discharge   Objective   Blood pressure 117/77, pulse 75, temperature 97.5 F (36.4 C), temperature source Oral, resp. rate 18, height 6' (1.829 m), weight 108.4 kg (239 lb), SpO2 98 %.   Intake/Output Summary (Last 24 hours) at 07/08/16 1425 Last data filed at 07/08/16 1200  Gross per 24 hour  Intake          3221.67 ml  Output             2550 ml  Net           671.67 ml    Exam Gen:- Awake, NAD HEENT:- [place].AT,  MMM Neck-Supple Neck,No JVD,  Lungs- CTA CV- S1, S2 normal Abd-  +ve B.Sounds, Abd Soft, No tenderness,    Extremity/Skin:- Intact, no cyanosis CNS - Alert, Ox 3  Data Review   CBC w Diff: Lab Results  Component Value Date  WBC 6.5 07/08/2016   HGB 12.8 (L) 07/08/2016   HCT 38.9 (L) 07/08/2016   HCT  45.3 04/27/2016   PLT 160 07/08/2016   PLT 182 04/27/2016   LYMPHOPCT 39 07/07/2016   MONOPCT 9 07/07/2016   EOSPCT 3 07/07/2016   BASOPCT 0 07/07/2016    CMP: Lab Results  Component Value Date   NA 140 07/08/2016   NA 142 04/27/2016   K 3.1 (L) 07/08/2016   CL 110 07/08/2016   CO2 23 07/08/2016   BUN 11 07/08/2016   BUN 18 04/27/2016   CREATININE 1.14 07/08/2016   PROT 7.0 07/07/2016   PROT 7.1 04/27/2016   ALBUMIN 4.2 07/07/2016   ALBUMIN 4.3 04/27/2016   BILITOT 0.5 07/07/2016   BILITOT 0.3 04/27/2016   ALKPHOS 79 07/07/2016   AST 31 07/07/2016   ALT 20 07/07/2016  .   Total Discharge time is about 33 minutes  OSEI-BONSU,Reece Fehnel M.D on 07/08/2016 at 2:25 PM  [facility]   Office  [phone]  Dragon dictation system was used to create this note, attempts have been made to correct errors, however presence of uncorrected errors is not a reflection quality of care provided

## 2016-07-08 NOTE — Progress Notes (Signed)
Patient discharged home - all discharge instructions discussed with patient and mother per his verbal permission - patient verbalizes understanding of all. Pt given one prescription for Potassium. Orders to schedule appt with specialist at [facility] and Neurologist, however, it is Saturday. Pt and his mother instructed to call specialist at [facility] and Neurologist on Monday to make appointments for follow up visits. State understanding. IV access removed. Pt discharged in stable condition, with stable vitals, and in no distress. Pt alert, verbal, oriented x4 at time of discharge with appropriate responses. Pt taken via wheelchair by NT to car with mother to drive him home.

## 2016-07-09 LAB — HEMOGLOBIN A1C
Hgb A1c MFr Bld: 5.4 % (ref 4.8–5.6)
Mean Plasma Glucose: 108 mg/dL

## 2016-07-14 ENCOUNTER — Other Ambulatory Visit: Payer: Self-pay | Admitting: *Deleted

## 2016-07-14 DIAGNOSIS — M352 Behcet's disease: Secondary | ICD-10-CM

## 2016-07-14 DIAGNOSIS — R400 Somnolence: Secondary | ICD-10-CM

## 2016-07-14 NOTE — Progress Notes (Signed)
Took call from phone staff. Spoke with Sharee Pimple from [facility]. Patient scheduled for LP tomorrow. Needed clarification for what lyme test Dr Jannifer Franklin would like. Advised CW,MD out of office today. Spoke with WID, Dr Rexene Alberts. Got VO to place order for [id number] (Lyme Disease Abs IgG, IgM, IFA, CSF (Order [id number]). Called Sharee Pimple back at [phone]. Relayed lab order above. She will call [facility] to verify which order this matches with. She will call back if any further questions arise. Advised we do close at 12pm. She verbalized understanding.

## 2016-07-17 ENCOUNTER — Ambulatory Visit
Admission: RE | Admit: 2016-07-17 | Discharge: 2016-07-17 | Disposition: A | Discharge status: Home / Self Care | Payer: Medicare Other | Source: Ambulatory Visit | Attending: Neurology | Admitting: Neurology

## 2016-07-17 VITALS — BP 114/82 | HR 75

## 2016-07-17 DIAGNOSIS — R400 Somnolence: Secondary | ICD-10-CM

## 2016-07-17 DIAGNOSIS — H532 Diplopia: Secondary | ICD-10-CM

## 2016-07-17 DIAGNOSIS — R42 Dizziness and giddiness: Secondary | ICD-10-CM

## 2016-07-17 DIAGNOSIS — R55 Syncope and collapse: Secondary | ICD-10-CM

## 2016-07-17 DIAGNOSIS — M352 Behcet's disease: Secondary | ICD-10-CM

## 2016-07-17 LAB — CSF CELL COUNT WITH DIFFERENTIAL
RBC Count, CSF: 0 cells/uL (ref 0–10)
WBC, CSF: 0 cells/uL (ref 0–5)

## 2016-07-17 LAB — PROTEIN, CSF: Total Protein, CSF: 84 mg/dL — ABNORMAL HIGH (ref 15–45)

## 2016-07-17 LAB — GLUCOSE, CSF: Glucose, CSF: 60 mg/dL (ref 43–76)

## 2016-07-17 NOTE — Progress Notes (Signed)
One SST tube of blood drawn from right [facility] space for LP labs; site unremarkable.  Cristal Ford, RN

## 2016-07-17 NOTE — Discharge Instructions (Signed)

## 2016-07-18 LAB — LYME AB/WESTERN BLOT REFLEX

## 2016-07-21 LAB — ANGIOTENSIN CONVERTING ENZYME, CSF: ACE, CSF: 5 U/L (ref <=15)

## 2016-07-21 LAB — VDRL, CSF: VDRL Quant, CSF: NONREACTIVE

## 2016-07-23 ENCOUNTER — Telehealth: Payer: Self-pay | Admitting: Neurology

## 2016-07-23 NOTE — Telephone Encounter (Signed)
   I called the patient. The CSF was normal with the exception that the CSF protein level was high, 84.  With the history of a gradually progressive cognitive decline in a patient with Behcet's disease, I would be concerned that the bechet's disease is the etiology of the changes in mentation. He will be seeing the MD who manages this in the next 3 weeks.

## 2016-08-03 ENCOUNTER — Inpatient Hospital Stay (HOSPITAL_COMMUNITY)
Admission: EM | Admit: 2016-08-03 | Discharge: 2016-08-05 | DRG: 100 | Disposition: A | Discharge status: Home / Self Care | Payer: Medicare Other | Attending: Pulmonary Disease | Admitting: Pulmonary Disease

## 2016-08-03 ENCOUNTER — Encounter (HOSPITAL_COMMUNITY): Payer: Self-pay

## 2016-08-03 ENCOUNTER — Emergency Department (HOSPITAL_COMMUNITY): Payer: Medicare Other

## 2016-08-03 DIAGNOSIS — R402432 Glasgow coma scale score 3-8, at arrival to emergency department: Secondary | ICD-10-CM | POA: Diagnosis present

## 2016-08-03 DIAGNOSIS — J9601 Acute respiratory failure with hypoxia: Secondary | ICD-10-CM | POA: Diagnosis present

## 2016-08-03 DIAGNOSIS — E876 Hypokalemia: Secondary | ICD-10-CM | POA: Diagnosis present

## 2016-08-03 DIAGNOSIS — R4189 Other symptoms and signs involving cognitive functions and awareness: Secondary | ICD-10-CM | POA: Diagnosis present

## 2016-08-03 DIAGNOSIS — M352 Behcet's disease: Secondary | ICD-10-CM | POA: Diagnosis present

## 2016-08-03 DIAGNOSIS — Z781 Physical restraint status: Secondary | ICD-10-CM

## 2016-08-03 DIAGNOSIS — F319 Bipolar disorder, unspecified: Secondary | ICD-10-CM | POA: Diagnosis present

## 2016-08-03 DIAGNOSIS — N179 Acute kidney failure, unspecified: Secondary | ICD-10-CM | POA: Diagnosis present

## 2016-08-03 DIAGNOSIS — G40909 Epilepsy, unspecified, not intractable, without status epilepticus: Secondary | ICD-10-CM | POA: Diagnosis present

## 2016-08-03 DIAGNOSIS — R569 Unspecified convulsions: Secondary | ICD-10-CM | POA: Diagnosis not present

## 2016-08-03 DIAGNOSIS — G9340 Encephalopathy, unspecified: Secondary | ICD-10-CM | POA: Diagnosis present

## 2016-08-03 DIAGNOSIS — I10 Essential (primary) hypertension: Secondary | ICD-10-CM | POA: Diagnosis present

## 2016-08-03 DIAGNOSIS — R1011 Right upper quadrant pain: Secondary | ICD-10-CM | POA: Diagnosis present

## 2016-08-03 DIAGNOSIS — G40301 Generalized idiopathic epilepsy and epileptic syndromes, not intractable, with status epilepticus: Secondary | ICD-10-CM

## 2016-08-03 DIAGNOSIS — E872 Acidosis, unspecified: Secondary | ICD-10-CM

## 2016-08-03 DIAGNOSIS — L989 Disorder of the skin and subcutaneous tissue, unspecified: Secondary | ICD-10-CM | POA: Diagnosis present

## 2016-08-03 DIAGNOSIS — G40419 Other generalized epilepsy and epileptic syndromes, intractable, without status epilepticus: Secondary | ICD-10-CM | POA: Diagnosis present

## 2016-08-03 DIAGNOSIS — Z888 Allergy status to other drugs, medicaments and biological substances status: Secondary | ICD-10-CM | POA: Diagnosis not present

## 2016-08-03 DIAGNOSIS — G43909 Migraine, unspecified, not intractable, without status migrainosus: Secondary | ICD-10-CM | POA: Diagnosis present

## 2016-08-03 DIAGNOSIS — Z79899 Other long term (current) drug therapy: Secondary | ICD-10-CM | POA: Diagnosis not present

## 2016-08-03 LAB — I-STAT ARTERIAL BLOOD GAS, ED
ACID-BASE EXCESS: 1 mmol/L (ref 0.0–2.0)
Bicarbonate: 24.9 mmol/L (ref 20.0–28.0)
O2 SAT: 100 %
PH ART: 7.427 (ref 7.350–7.450)
PO2 ART: 412 mmHg — AB (ref 83.0–108.0)
TCO2: 26 mmol/L (ref 0–100)
pCO2 arterial: 37.8 mmHg (ref 32.0–48.0)

## 2016-08-03 LAB — COMPREHENSIVE METABOLIC PANEL
ALBUMIN: 4.2 g/dL (ref 3.5–5.0)
ALK PHOS: 84 U/L (ref 38–126)
ALT: 63 U/L (ref 17–63)
AST: 44 U/L — ABNORMAL HIGH (ref 15–41)
Anion gap: 10 (ref 5–15)
BILIRUBIN TOTAL: 0.5 mg/dL (ref 0.3–1.2)
BUN: 19 mg/dL (ref 6–20)
CALCIUM: 9.1 mg/dL (ref 8.9–10.3)
CO2: 23 mmol/L (ref 22–32)
Chloride: 104 mmol/L (ref 101–111)
Creatinine, Ser: 1.32 mg/dL — ABNORMAL HIGH (ref 0.61–1.24)
GFR calc Af Amer: >60 mL/min (ref >60)
GFR calc non Af Amer: >60 mL/min (ref >60)
GLUCOSE: 101 mg/dL — AB (ref 65–99)
Potassium: 3.5 mmol/L (ref 3.5–5.1)
Sodium: 137 mmol/L (ref 135–145)
Total Protein: 7.3 g/dL (ref 6.5–8.1)

## 2016-08-03 LAB — I-STAT CHEM 8, ED
BUN: 25 mg/dL — AB (ref 6–20)
CHLORIDE: 105 mmol/L (ref 101–111)
CREATININE: 1.3 mg/dL — AB (ref 0.61–1.24)
Calcium, Ion: 1.11 mmol/L — ABNORMAL LOW (ref 1.15–1.40)
Glucose, Bld: 115 mg/dL — ABNORMAL HIGH (ref 65–99)
HCT: 43 % (ref 39.0–52.0)
Hemoglobin: 14.6 g/dL (ref 13.0–17.0)
Potassium: 3.9 mmol/L (ref 3.5–5.1)
SODIUM: 141 mmol/L (ref 135–145)
TCO2: 25 mmol/L (ref 0–100)

## 2016-08-03 LAB — GLUCOSE, CAPILLARY: GLUCOSE-CAPILLARY: 115 mg/dL — AB (ref 65–99)

## 2016-08-03 LAB — CBC WITH DIFFERENTIAL/PLATELET
BASOS PCT: 1 %
Basophils Absolute: 0 10*3/uL (ref 0.0–0.1)
Eosinophils Absolute: 0.2 10*3/uL (ref 0.0–0.7)
Eosinophils Relative: 4 %
HEMATOCRIT: 44.5 % (ref 39.0–52.0)
HEMOGLOBIN: 14.7 g/dL (ref 13.0–17.0)
Lymphocytes Relative: 27 %
Lymphs Abs: 1.8 10*3/uL (ref 0.7–4.0)
MCH: 30.5 pg (ref 26.0–34.0)
MCHC: 33 g/dL (ref 30.0–36.0)
MCV: 92.3 fL (ref 78.0–100.0)
MONOS PCT: 7 %
Monocytes Absolute: 0.4 10*3/uL (ref 0.1–1.0)
NEUTROS ABS: 4 10*3/uL (ref 1.7–7.7)
Neutrophils Relative %: 61 %
Platelets: 171 10*3/uL (ref 150–400)
RBC: 4.82 MIL/uL (ref 4.22–5.81)
RDW: 12.8 % (ref 11.5–15.5)
WBC: 6.4 10*3/uL (ref 4.0–10.5)

## 2016-08-03 LAB — I-STAT CG4 LACTIC ACID, ED: Lactic Acid, Venous: 4.04 mmol/L (ref 0.5–1.9)

## 2016-08-03 LAB — PROTIME-INR
INR: 1.03
Prothrombin Time: 13.5 seconds (ref 11.4–15.2)

## 2016-08-03 LAB — TRIGLYCERIDES: TRIGLYCERIDES: 417 mg/dL — AB (ref <150)

## 2016-08-03 LAB — LACTIC ACID, PLASMA
LACTIC ACID, VENOUS: 3.1 mmol/L — AB (ref 0.5–1.9)
Lactic Acid, Venous: 2.1 mmol/L (ref 0.5–1.9)

## 2016-08-03 LAB — ETHANOL: Alcohol, Ethyl (B): <5 mg/dL (ref <5)

## 2016-08-03 LAB — AMMONIA: Ammonia: 34 umol/L (ref 9–35)

## 2016-08-03 LAB — PROCALCITONIN: Procalcitonin: <0.1 ng/mL

## 2016-08-03 LAB — APTT: aPTT: 28 seconds (ref 24–36)

## 2016-08-03 LAB — MRSA PCR SCREENING: MRSA BY PCR: NEGATIVE

## 2016-08-03 MED ORDER — PROPOFOL 1000 MG/100ML IV EMUL: 5.0000 ug/kg/min | INTRAVENOUS | Status: DC

## 2016-08-03 MED ORDER — SODIUM CHLORIDE 0.9 % IV BOLUS (SEPSIS)
1000.0000 mL | Freq: Once | INTRAVENOUS | Status: AC
  Administered 2016-08-03: 1000 mL via INTRAVENOUS

## 2016-08-03 MED ORDER — ASPIRIN 300 MG RE SUPP: 300.0000 mg | RECTAL | Status: AC

## 2016-08-03 MED ORDER — CHLORHEXIDINE GLUCONATE 0.12% ORAL RINSE (MEDLINE KIT)
15.0000 mL | Freq: Two times a day (BID) | OROMUCOSAL | Status: DC
  Administered 2016-08-03 – 2016-08-04 (×2): 15 mL via OROMUCOSAL

## 2016-08-03 MED ORDER — METOPROLOL TARTRATE 5 MG/5ML IV SOLN: 2.5000 mg | INTRAVENOUS | Status: DC | PRN

## 2016-08-03 MED ORDER — PANTOPRAZOLE SODIUM 40 MG PO PACK: 40.0000 mg | PACK | Freq: Every day | ORAL | Status: DC

## 2016-08-03 MED ORDER — ORAL CARE MOUTH RINSE
15.0000 mL | OROMUCOSAL | Status: DC
  Administered 2016-08-03 – 2016-08-04 (×5): 15 mL via OROMUCOSAL

## 2016-08-03 MED ORDER — ETOMIDATE 2 MG/ML IV SOLN: 20.0000 mg | Freq: Once | INTRAVENOUS | Status: AC

## 2016-08-03 MED ORDER — MIDAZOLAM HCL 2 MG/2ML IJ SOLN
INTRAMUSCULAR | Status: AC
  Filled 2016-08-03: qty 4

## 2016-08-03 MED ORDER — SUCCINYLCHOLINE CHLORIDE 20 MG/ML IJ SOLN
200.0000 mg | Freq: Once | INTRAMUSCULAR | Status: AC
  Filled 2016-08-03: qty 10

## 2016-08-03 MED ORDER — ASPIRIN 81 MG PO CHEW
324.0000 mg | CHEWABLE_TABLET | ORAL | Status: AC
  Administered 2016-08-03: 324 mg via ORAL
  Filled 2016-08-03: qty 4

## 2016-08-03 MED ORDER — PROPOFOL 1000 MG/100ML IV EMUL
INTRAVENOUS | Status: AC
  Filled 2016-08-03: qty 100

## 2016-08-03 MED ORDER — SODIUM CHLORIDE 0.9 % IV SOLN
INTRAVENOUS | Status: DC
  Administered 2016-08-03: 21:00:00 via INTRAVENOUS

## 2016-08-03 MED ORDER — PROPOFOL 1000 MG/100ML IV EMUL
0.0000 ug/kg/min | INTRAVENOUS | Status: DC
  Administered 2016-08-03: 40 ug/kg/min via INTRAVENOUS
  Administered 2016-08-03: 5 ug/kg/min via INTRAVENOUS
  Filled 2016-08-03 (×2): qty 100

## 2016-08-03 MED ORDER — HEPARIN SODIUM (PORCINE) 5000 UNIT/ML IJ SOLN
5000.0000 [IU] | Freq: Three times a day (TID) | INTRAMUSCULAR | Status: DC
Start: 2016-08-03 — End: 2016-08-05
  Administered 2016-08-03 – 2016-08-05 (×5): 5000 [IU] via SUBCUTANEOUS
  Filled 2016-08-03 (×5): qty 1

## 2016-08-03 MED ORDER — FENTANYL 2500MCG IN NS 250ML (10MCG/ML) PREMIX INFUSION
25.0000 ug/h | INTRAVENOUS | Status: DC
  Administered 2016-08-03 – 2016-08-04 (×2): 50 ug/h via INTRAVENOUS
  Filled 2016-08-03 (×3): qty 250

## 2016-08-03 MED ORDER — FOLIC ACID 5 MG/ML IJ SOLN
1.0000 mg | Freq: Every day | INTRAMUSCULAR | Status: DC
  Administered 2016-08-03 – 2016-08-04 (×2): 1 mg via INTRAVENOUS
  Filled 2016-08-03 (×4): qty 0.2

## 2016-08-03 MED ORDER — LAMOTRIGINE 100 MG PO TABS
200.0000 mg | ORAL_TABLET | Freq: Two times a day (BID) | ORAL | Status: DC
  Administered 2016-08-03 – 2016-08-05 (×4): 200 mg
  Filled 2016-08-03 (×2): qty 2
  Filled 2016-08-03: qty 1
  Filled 2016-08-03: qty 2
  Filled 2016-08-03: qty 1
  Filled 2016-08-03: qty 2

## 2016-08-03 MED ORDER — SODIUM CHLORIDE 0.9 % IV SOLN: 1.0000 g | Freq: Once | INTRAVENOUS | Status: DC

## 2016-08-03 MED ORDER — SODIUM CHLORIDE 0.9 % IV SOLN: 250.0000 mL | INTRAVENOUS | Status: DC | PRN

## 2016-08-03 MED ORDER — SUCCINYLCHOLINE CHLORIDE 20 MG/ML IJ SOLN
INTRAMUSCULAR | Status: AC | PRN
Start: 2016-08-03 — End: 2016-08-03
  Administered 2016-08-03: 200 mg via INTRAVENOUS

## 2016-08-03 MED ORDER — ETOMIDATE 2 MG/ML IV SOLN
INTRAVENOUS | Status: AC | PRN
  Administered 2016-08-03: 20 mg via INTRAVENOUS

## 2016-08-03 MED ORDER — MIDAZOLAM HCL 5 MG/5ML IJ SOLN
4.0000 mg | Freq: Once | INTRAMUSCULAR | Status: AC
  Administered 2016-08-03: 4 mg via INTRAVENOUS

## 2016-08-03 MED ORDER — FENTANYL CITRATE (PF) 100 MCG/2ML IJ SOLN
100.0000 ug | Freq: Once | INTRAMUSCULAR | Status: AC
  Administered 2016-08-03: 100 ug via INTRAVENOUS
  Filled 2016-08-03: qty 2

## 2016-08-03 NOTE — H&P (Signed)
PULMONARY / CRITICAL CARE MEDICINE   Name: [name] [name] MRN: [id number] DOB: [birth year]    ADMISSION DATE:  08/03/2016 CONSULTATION DATE:  08/03/16  REFERRING MD:  Tyrone Nine  CHIEF COMPLAINT:  AMS  HISTORY OF PRESENT ILLNESS:  Pt is encephelopathic; therefore, this HPI is obtained from chart review. MIKING USREY is a 36 y.o. male with PMH as outlined below including seizures (followed by Dr. Jannifer Franklin of [facility]) and Behcet's Disease (followed by [facility]).  He had seizure in March 2017 with unclear etiology.  Followed up again with Dr. Jannifer Franklin and  Had LP performed 07/17/16 which returned with elevated proteins (84).  On 5/3, he was out to lunch with his mother and was in his usual state of health.  Following lunch, mother states that pt informed her that he felt that was going to have another seizure (supposedly he has the same feelings prior to all seizures).  He then began to foam at the mouth and had jerking of RUE and RLE.  He was brought to [facility] ED where he remained encephalopathic but had no further signs of seizures.  He was subsequently intubated and PCCM was called for admission.  Head CT negative.  Lactic acid 4.  PAST MEDICAL HISTORY :  He  has a past medical history of Behcet's disease ([place]); Bipolar disorder ([place]); Hypertension; Migraine; Seizures ([place]); and Vitamin D deficiency.  PAST SURGICAL HISTORY: He  has a past surgical history that includes Appendectomy; maxilofacial; Fracture surgery (Right); and Nissen fundoplication.  Allergies  Allergen Reactions  . Actos [Pioglitazone] Rash    No current facility-administered medications on file prior to encounter.    Current Outpatient Prescriptions on File Prior to Encounter  Medication Sig  . Apremilast (OTEZLA) 30 MG TABS Take 30 mg by mouth 2 (two) times daily.  Marland Kitchen atenolol (TENORMIN) 50 MG tablet Take 1 tablet (50 mg total) by mouth daily.  . clonazePAM (KLONOPIN) 0.5 MG tablet Take 1 tablet (0.5 mg total) by mouth 3  (three) times daily. For anxiety  . ergocalciferol (VITAMIN D2) 50000 units capsule Take 50,000 Units by mouth every Wednesday.  Marland Kitchen FLUoxetine (PROZAC) 20 MG tablet Take 20 mg by mouth 3 (three) times daily.   . folic acid (FOLVITE) 1 MG tablet Take 1 mg by mouth daily.   Marland Kitchen lamoTRIgine (LAMICTAL) 200 MG tablet Take 1 tablet (200 mg total) by mouth 2 (two) times daily.  . Melatonin 5 MG TABS Take 5 mg by mouth at bedtime.  . Methotrexate Sodium (METHOTREXATE, PF,) 50 MG/2ML injection Inject 25 mg into the skin every Sunday.  . potassium chloride (K-DUR) 10 MEQ tablet Take 1 tablet (10 mEq total) by mouth daily.  . QUEtiapine (SEROQUEL XR) 300 MG 24 hr tablet Take 300 mg by mouth at bedtime.    FAMILY HISTORY:  His indicated that his mother is alive. He indicated that his father is alive. He indicated that his sister is alive. He indicated that his maternal grandmother is deceased. He indicated that his paternal grandfather is alive.    SOCIAL HISTORY: He  reports that he has never smoked. He has never used smokeless tobacco. He reports that he does not drink alcohol or use drugs.  REVIEW OF SYSTEMS:   Unable to obtain as pt is encephalopathic.  SUBJECTIVE:  On vent, unresponsive.  VITAL SIGNS: BP 108/60   Pulse (!) 101   Resp 14   Ht 6\' 3"  (1.905 m)   Wt 113.4 kg (250 lb)  SpO2 100%   BMI 31.25 kg/m   HEMODYNAMICS:    VENTILATOR SETTINGS: Vent Mode: PRVC FiO2 (%):  [100 %] 100 % Set Rate:  [15 bmp] 15 bmp Vt Set:  [670 mL] 670 mL PEEP:  [5 cmH20] 5 cmH20 Plateau Pressure:  [18 cmH20] 18 cmH20  INTAKE / OUTPUT: No intake/output data recorded.   PHYSICAL EXAMINATION: General: Young male, in NAD. Neuro: Sedated, not responsive. HEENT: /AT. PERRL, sclerae anicteric. Cardiovascular: RRR, no M/R/G.  Lungs: Respirations even and unlabored.  CTA bilaterally, No W/R/R. Abdomen: BS x 4, soft, NT/ND.  Musculoskeletal: No gross deformities, no edema.  Skin: Intact, warm,  no rashes.  LABS:  BMET  Recent Labs Lab 08/03/16 1632  NA 141  K 3.9  CL 105  BUN 25*  CREATININE 1.30*  GLUCOSE 115*    Electrolytes No results for input(s): CALCIUM, MG, PHOS in the last 168 hours.  CBC  Recent Labs Lab 08/03/16 1500 08/03/16 1632  WBC 6.4  --   HGB 14.7 14.6  HCT 44.5 43.0  PLT 171  --     Coag's No results for input(s): APTT, INR in the last 168 hours.  Sepsis Markers  Recent Labs Lab 08/03/16 1632  LATICACIDVEN 4.04*    ABG No results for input(s): PHART, PCO2ART, PO2ART in the last 168 hours.  Liver Enzymes No results for input(s): AST, ALT, ALKPHOS, BILITOT, ALBUMIN in the last 168 hours.  Cardiac Enzymes No results for input(s): TROPONINI, PROBNP in the last 168 hours.  Glucose No results for input(s): GLUCAP in the last 168 hours.  Imaging Ct Head Wo Contrast  Result Date: 08/03/2016 CLINICAL DATA:  Ct head wo, seizures, unresponsive EXAM: CT HEAD WITHOUT CONTRAST TECHNIQUE: Contiguous axial images were obtained from the base of the skull through the vertex without intravenous contrast. COMPARISON:  07/07/2016 FINDINGS: Brain: No evidence of acute infarction, hemorrhage, hydrocephalus, extra-axial collection or mass lesion/mass effect. Vascular: No hyperdense vessel or unexpected calcification. Skull: No acute abnormality of the calvarium. Sinuses/Orbits: Prior ORIF of multiple facial fractures. Significant mucoperiosteal thickening of the left maxillary sinus. Other: None IMPRESSION: 1.  No evidence for acute intracranial abnormality. 2. Remote fractures and surgical changes of the facial bones. 3. Left maxillary sinus disease. Electronically Signed   By: Nolon Nations M.D.   On: 08/03/2016 16:51   Dg Chest Portable 1 View  Result Date: 08/03/2016 CLINICAL DATA:  Status post intubation EXAM: PORTABLE CHEST 1 VIEW COMPARISON:  06/21/2016 FINDINGS: Endotracheal tube and nasogastric catheter are noted in satisfactory position.  The heart is within normal limits. The lungs are clear bilaterally. No bony abnormality is seen. IMPRESSION: Tubes and lines as described.  No acute abnormality noted. Electronically Signed   By: Inez Catalina M.D.   On: 08/03/2016 16:02     STUDIES:  Head CT 5/3 > negative. CXR 5/3 > negative.  CULTURES: Blood 5/3 >  Urine 5/3 >  Sputum 5/3 >   ANTIBIOTICS: None.  SIGNIFICANT EVENTS: 5/3 > admit.  LINES/TUBES: 5/3 > admit.  DISCUSSION: 36 y.o. male with hx Seizures and Behcet's Syndrome, admitted 5/3 after seizures and AMS (? Post ictal state).  Required intubation in ED.  ASSESSMENT / PLAN:  NEUROLOGIC A:   Acute encephalopathy - ? Post ictal state vs progressive chronic confusional syndrome secondary to Behcet's (per neuro). Hx seizures (followed by Dr. Jannifer Franklin). P:   Sedation:  Propofol gtt / Fentanyl gtt. RASS goal: 0 to -1. Daily WUA. Neuro consulted. Assess  EEG. AED's per neuro. Hold preadmission clonazepam, fluoxetine, lamotrigine, quetiapine.  PULMONARY A: VDRF - due to inability to protect the airway in the setting of AMS (? Post ictal). P:   Full vent support. Wean as able. VAP prevention measures. SBT in AM if able. CXR in AM.  CARDIOVASCULAR A:  Hx HTN. P:  Lopressor PRN. Trend lactate. Hold preadmission atenolol.  RENAL A:   AKI. Hypocalcemia. P:   NS @ 75. 1g Ca gluconate. BMP in AM.  GASTROINTESTINAL A:   GI prophylaxis. Nutrition. P:   SUP: Pantoprazole. NPO.  HEMATOLOGIC A:   VTE Prophylaxis. P:  SCD's / heparin. CBC in AM.  INFECTIOUS A:   No indication of infection. P:   Pan culture for completeness. Assess PCT - if high, start empiric abx.  ENDOCRINE A:   No acute issues.   P:   No interventions required.  RHEUMATOLOGIC A: Hx Behcet Syndrome. P: Preadmission otezla not on hospital formulary. Preadmission methotrexate dosed once weekly.  Family updated: Mother updated by Dr.  Nelda Marseille.  Interdisciplinary Family Meeting v Palliative Care Meeting:  Due by: 08/10/16.  CC time: 30 min.   Montey Hora, [facility] Pulmonary [facility] Critical [facility] Medicine Pager: [phone]  or [phone] 08/03/2016, 24:63 PM  36 year old male with history of Behcet's disease and seizure disorder who had a seizure earlier today and presents to the hospital post ictal and was intubated for airway protection.  PCCM called to admit.  Patient evidently has liver and brain involvement with his Behcet's disease and recent LP showed protein in the CSF.  Neck is not rigid and patient was not febrile making infection less likely but patient is on immune modulator.  Consulted neurology and ordered EEG.  I reviewed CXR myself, no abnormalities noted.  On exam, patient is sedated but exam is non-focal.  Full vent support and adjust vent accordingly.  Keep some beta blockers on board to avoid withdrawal tachycardia.  Admit to the ICU with neurology consultation.  The patient is critically ill with multiple organ systems failure and requires high complexity decision making for assessment and support, frequent evaluation and titration of therapies, application of advanced monitoring technologies and extensive interpretation of multiple databases.   Critical Care Time devoted to patient care services described in this note is  35  Minutes. This time reflects time of care of this signee Dr Jennet Maduro. This critical care time does not reflect procedure time, or teaching time or supervisory time of PA/NP/Med student/Med Resident etc but could involve care discussion time.  Rush Farmer, M.D. [facility] Pulmonary/Critical Care Medicine. Pager: [phone]. After hours pager: [phone].

## 2016-08-03 NOTE — ED Notes (Signed)
Pt staring and does not respond to sternal rub. Dr. Tyrone Nine at bedside

## 2016-08-03 NOTE — ED Provider Notes (Signed)
[name] [name] Provider Note   CSN: [id number] Arrival date & time: 08/03/16  1432     History   Chief Complaint Chief Complaint  Patient presents with  . Seizures    HPI [name] [name] is a 36 y.o. male.  36 yo M with a chief complaint of altered mental status. Patient had lunch normally today and then started to become more and more sleepy per the family they brought him in to the ED he is able to walk in to triage and was not able to stand up to go back to her room. Upon my arrival the patient was no longer verbal not following commands. Family states that he's been seen at [facility] recently and has an upcoming liver procedure. She does not know if he is started on any new medications. Denied any complaints other than being sleepy. Nursing staff noted that he had some shaking to his right leg. Has a history of a seizure disorder. Seizures have never been like this before.   The history is provided by the patient.  Seizures   Pertinent negatives include no confusion, no headaches, no visual disturbance, no chest pain, no vomiting and no diarrhea.  Illness  This is a new problem. The current episode started 1 to 2 hours ago. The problem occurs constantly. The problem has been rapidly worsening. Associated symptoms include abdominal pain. Pertinent negatives include no chest pain, no headaches and no shortness of breath. Nothing aggravates the symptoms. Nothing relieves the symptoms. He has tried nothing for the symptoms. The treatment provided no relief.    Past Medical History:  Diagnosis Date  . Behcet's disease ([place])   . Bipolar disorder ([place])   . Hypertension   . Migraine   . Seizures ([place])    last experienced a seizure x 3 years ago  . Vitamin D deficiency     Patient Active Problem List   Diagnosis Date Noted  . Post-ictal coma ([place]) 08/03/2016  . Acute respiratory failure with hypoxemia ([place])   . Seizure ([place])   . Altered mental status 07/07/2016  . Peripheral  vertigo 07/17/2015  . Vertigo 07/17/2015  . Intractable vomiting 07/17/2015  . Diplopia   . Cellulitis and abscess of trunk-right chest wall 05/08/2013  . Dehydration 02/15/2013  . Behcet's disease ([place]) 02/15/2013  . Recurrent vomiting 02/15/2013  . Encounter for therapeutic drug monitoring 06/14/2012  . History of oral aphthous ulcers 10/19/2010  . BIPOLAR DISORDER UNSPECIFIED 06/18/2008  . Essential hypertension 06/18/2008  . SYNCOPE AND COLLAPSE 06/18/2008  . Epilepsy ([place]) 06/18/2008    Past Surgical History:  Procedure Laterality Date  . APPENDECTOMY    . FRACTURE SURGERY Right    Arm  . maxilofacial    . NISSEN FUNDOPLICATION         Home Medications    Prior to Admission medications   Medication Sig Start Date End Date Taking? Authorizing Provider  Melatonin 5 MG TABS Take 5 mg by mouth at bedtime.   Yes Historical Provider, MD  Apremilast (OTEZLA) 30 MG TABS Take 30 mg by mouth 2 (two) times daily.    Historical Provider, MD  atenolol (TENORMIN) 50 MG tablet Take 1 tablet (50 mg total) by mouth daily. 02/16/13   Robbie Lis, MD  clonazePAM (KLONOPIN) 0.5 MG tablet Take 1 tablet (0.5 mg total) by mouth 3 (three) times daily. For anxiety 02/16/13   Robbie Lis, MD  ergocalciferol (VITAMIN D2) 50000 units capsule Take 50,000 Units by  mouth every Wednesday.    Historical Provider, MD  FLUoxetine (PROZAC) 20 MG tablet Take 20 mg by mouth 3 (three) times daily.     Historical Provider, MD  folic acid (FOLVITE) 1 MG tablet Take 1 mg by mouth daily.     Historical Provider, MD  lamoTRIgine (LAMICTAL) 200 MG tablet Take 1 tablet (200 mg total) by mouth 2 (two) times daily. 04/27/16   Ward Givens, NP  Methotrexate Sodium (METHOTREXATE, PF,) 50 MG/2ML injection Inject 25 mg into the skin every Sunday.    Historical Provider, MD  potassium chloride (K-DUR) 10 MEQ tablet Take 1 tablet (10 mEq total) by mouth daily. 07/08/16   Benito Mccreedy, MD  QUEtiapine (SEROQUEL XR)  300 MG 24 hr tablet Take 300 mg by mouth at bedtime.    Historical Provider, MD    Family History Family History  Problem Relation Age of Onset  . Lung cancer Paternal Grandfather   . Other Father     BPPV    Social History Social History  Substance Use Topics  . Smoking status: Never Smoker  . Smokeless tobacco: Never Used  . Alcohol use No     Allergies   Actos [pioglitazone]   Review of Systems Review of Systems  Unable to perform ROS: Patient unresponsive  Constitutional: Negative for chills and fever.  HENT: Negative for congestion and facial swelling.   Eyes: Negative for discharge and visual disturbance.  Respiratory: Negative for shortness of breath.   Cardiovascular: Negative for chest pain and palpitations.  Gastrointestinal: Positive for abdominal pain. Negative for diarrhea and vomiting.  Musculoskeletal: Negative for arthralgias and myalgias.  Skin: Negative for color change and rash.  Neurological: Positive for seizures. Negative for tremors, syncope and headaches.  Psychiatric/Behavioral: Negative for confusion and dysphoric mood.     Physical Exam Updated Vital Signs BP 110/78   Pulse 95   Temp 97.9 F (36.6 C) (Rectal)   Resp 17   Ht _0  (1.905 m)   Wt 250 lb (113.4 kg)   SpO2 100%   BMI 31.25 kg/m   Physical Exam  Constitutional: He appears well-developed and well-nourished.  HENT:  Head: Normocephalic and atraumatic.  Eyes: EOM are normal. Pupils are unequal.  4 mm on the left and 3 on the right.  Neck: Normal range of motion. Neck supple. No JVD present.  Cardiovascular: Regular rhythm.  Tachycardia present.  Exam reveals no gallop and no friction rub.   No murmur heard. Pulmonary/Chest: No respiratory distress. He has no wheezes.  Abdominal: He exhibits no distension and no mass. There is no tenderness. There is no rebound and no guarding.  Musculoskeletal: Normal range of motion.  Neurological: GCS eye subscore is 4. GCS verbal  subscore is 1. GCS motor subscore is 1.  4 beats of clonus to the right lower extremity into to the left. Negative Babinski.  Skin: No rash noted. No pallor.  Psychiatric: He has a normal mood and affect. His behavior is normal.  Nursing note and vitals reviewed.    ED Treatments / Results  Labs (all labs ordered are listed, but only abnormal results are displayed) Labs Reviewed  COMPREHENSIVE METABOLIC PANEL - Abnormal; Notable for the following:       Result Value   Glucose, Bld 101 (*)    Creatinine, Ser 1.32 (*)    AST 44 (*)    All other components within normal limits  TRIGLYCERIDES - Abnormal; Notable for the following:  Triglycerides 417 (*)    All other components within normal limits  I-STAT CHEM 8, ED - Abnormal; Notable for the following:    BUN 25 (*)    Creatinine, Ser 1.30 (*)    Glucose, Bld 115 (*)    Calcium, Ion 1.11 (*)    All other components within normal limits  I-STAT CG4 LACTIC ACID, ED - Abnormal; Notable for the following:    Lactic Acid, Venous 4.04 (*)    All other components within normal limits  CULTURE, BLOOD (ROUTINE X 2)  CULTURE, BLOOD (ROUTINE X 2)  URINE CULTURE  CULTURE, RESPIRATORY (NON-EXPECTORATED)  CBC WITH DIFFERENTIAL/PLATELET  AMMONIA  ETHANOL  LAMOTRIGINE LEVEL  PROCALCITONIN  PROTIME-INR  APTT  URINALYSIS, ROUTINE W REFLEX MICROSCOPIC  CBC  BASIC METABOLIC PANEL  BLOOD GAS, ARTERIAL  MAGNESIUM  PHOSPHORUS  LACTIC ACID, PLASMA  LACTIC ACID, PLASMA  I-STAT CHEM 8, ED  CBG MONITORING, ED    EKG  EKG Interpretation None       Radiology Ct Head Wo Contrast  Result Date: 08/03/2016 CLINICAL DATA:  Ct head wo, seizures, unresponsive EXAM: CT HEAD WITHOUT CONTRAST TECHNIQUE: Contiguous axial images were obtained from the base of the skull through the vertex without intravenous contrast. COMPARISON:  07/07/2016 FINDINGS: Brain: No evidence of acute infarction, hemorrhage, hydrocephalus, extra-axial collection or  mass lesion/mass effect. Vascular: No hyperdense vessel or unexpected calcification. Skull: No acute abnormality of the calvarium. Sinuses/Orbits: Prior ORIF of multiple facial fractures. Significant mucoperiosteal thickening of the left maxillary sinus. Other: None IMPRESSION: 1.  No evidence for acute intracranial abnormality. 2. Remote fractures and surgical changes of the facial bones. 3. Left maxillary sinus disease. Electronically Signed   By: Nolon Nations M.D.   On: 08/03/2016 16:51   Dg Chest Portable 1 View  Result Date: 08/03/2016 CLINICAL DATA:  Status post intubation EXAM: PORTABLE CHEST 1 VIEW COMPARISON:  06/21/2016 FINDINGS: Endotracheal tube and nasogastric catheter are noted in satisfactory position. The heart is within normal limits. The lungs are clear bilaterally. No bony abnormality is seen. IMPRESSION: Tubes and lines as described.  No acute abnormality noted. Electronically Signed   By: Inez Catalina M.D.   On: 08/03/2016 16:02    Procedures Procedure Name: Intubation Date/Time: 08/03/2016 5:53 PM Performed by: Tyrone Nine Yun Gutierrez Pre-anesthesia Checklist: Patient identified, Emergency Drugs available, Suction available, Patient being monitored and Timeout performed Oxygen Delivery Method: Non-rebreather mask Preoxygenation: Pre-oxygenation with 100% oxygen Intubation Type: Rapid sequence Laryngoscope Size: Mac and 4 Grade View: Grade II Tube size: 7.5 mm Number of attempts: 1 Placement Confirmation: ETT inserted through vocal cords under direct vision Secured at: 22 cm Tube secured with: ETT holder Dental Injury: Teeth and Oropharynx as per pre-operative assessment  Difficulty Due To: Difficulty was anticipated and Difficult Airway- due to large tongue Future Recommendations: Recommend- induction with short-acting agent, and alternative techniques readily available      (including critical care time)  Medications Ordered in ED Medications  etomidate (AMIDATE) injection  20 mg (not administered)  succinylcholine (ANECTINE) injection 200 mg (not administered)  midazolam (VERSED) 2 MG/2ML injection (  Not Given 08/03/16 1548)  propofol (DIPRIVAN) 1000 MG/100ML infusion (0 mcg/kg/min  113.4 kg Intravenous Transfusing/Transfer 08/03/16 1748)  propofol (DIPRIVAN) 1000 MG/100ML infusion (not administered)  0.9 %  sodium chloride infusion (not administered)  aspirin chewable tablet 324 mg (not administered)    Or  aspirin suppository 300 mg (not administered)  heparin injection 5,000 Units (not administered)  pantoprazole sodium (  PROTONIX) 40 mg/20 mL oral suspension 40 mg (not administered)  fentaNYL 2564mg in NS 2523m(1042mml) infusion-PREMIX (not administered)  metoprolol (LOPRESSOR) injection 2.5-5 mg (not administered)  0.9 %  sodium chloride infusion (not administered)  calcium gluconate 1 g in sodium chloride 0.9 % 100 mL IVPB (not administered)  folic acid injection 1 mg (not administered)  sodium chloride 0.9 % bolus 1,000 mL (1,000 mLs Intravenous Transfusing/Transfer 08/03/16 1748)  etomidate (AMIDATE) injection (20 mg Intravenous Given 08/03/16 1531)  succinylcholine (ANECTINE) injection (200 mg Intravenous Given 08/03/16 1532)  midazolam (VERSED) 5 MG/5ML injection 4 mg (4 mg Intravenous Given 08/03/16 1543)  fentaNYL (SUBLIMAZE) injection 100 mcg (100 mcg Intravenous Given 08/03/16 1542)     Initial Impression / Assessment and Plan / ED Course  I have reviewed the triage vital signs and the nursing notes.  Pertinent labs & imaging results that were available during my care of the patient were reviewed by me and considered in my medical decision making (see chart for details).     [age] M With a chief complaint of altered mental status. On my initial exam the patient had open eyes but otherwise was completely unresponsive. Not protecting his airway. Intubated for airway protection. Will obtain a CT head.    Per the nursing staff he had some isolated  shaking of his right leg prior to becoming unresponsive. On record review was noted that the patient had an LP done 2 weeks ago that had high protein. They're concerned for Behcets in the CSF.   The patient is noted to have a lactate>4. With the current information available to me, I don't think the patient is in septic shock. The lactate>4, is related to seizure activity.   After intubation the patient became responsive again moving his upper extremities. Equal strength. He is unable to move his legs at all. Has intact sensation to light touch bilaterally.  I discussed the case with Dr. SumMadalyn Robritical care as well as Dr. OstShon Haleeurology. Admit.  CRITICAL CARE Performed by: DanCecilio AsperTotal critical care time: 80 minutes  Critical care time was exclusive of separately billable procedures and treating other patients.  Critical care was necessary to treat or prevent imminent or life-threatening deterioration.  Critical care was time spent personally by me on the following activities: development of treatment plan with patient and/or surrogate as well as nursing, discussions with consultants, evaluation of patient's response to treatment, examination of patient, obtaining history from patient or surrogate, ordering and performing treatments and interventions, ordering and review of laboratory studies, ordering and review of radiographic studies, pulse oximetry and re-evaluation of patient's condition.  The patients results and plan were reviewed and discussed.   Any x-rays performed were independently reviewed by myself.   Differential diagnosis were considered with the presenting HPI.  Medications  etomidate (AMIDATE) injection 20 mg (not administered)  succinylcholine (ANECTINE) injection 200 mg (not administered)  midazolam (VERSED) 2 MG/2ML injection (  Not Given 08/03/16 1548)  propofol (DIPRIVAN) 1000 MG/100ML infusion (0 mcg/kg/min  113.4 kg Intravenous  Transfusing/Transfer 08/03/16 1748)  propofol (DIPRIVAN) 1000 MG/100ML infusion (not administered)  0.9 %  sodium chloride infusion (not administered)  aspirin chewable tablet 324 mg (not administered)    Or  aspirin suppository 300 mg (not administered)  heparin injection 5,000 Units (not administered)  pantoprazole sodium (PROTONIX) 40 mg/20 mL oral suspension 40 mg (not administered)  fentaNYL 2500m38mn NS 250mL23mmcg38m infusion-PREMIX (not administered)  metoprolol (  LOPRESSOR) injection 2.5-5 mg (not administered)  0.9 %  sodium chloride infusion (not administered)  calcium gluconate 1 g in sodium chloride 0.9 % 100 mL IVPB (not administered)  folic acid injection 1 mg (not administered)  sodium chloride 0.9 % bolus 1,000 mL (1,000 mLs Intravenous Transfusing/Transfer 08/03/16 1748)  etomidate (AMIDATE) injection (20 mg Intravenous Given 08/03/16 1531)  succinylcholine (ANECTINE) injection (200 mg Intravenous Given 08/03/16 1532)  midazolam (VERSED) 5 MG/5ML injection 4 mg (4 mg Intravenous Given 08/03/16 1543)  fentaNYL (SUBLIMAZE) injection 100 mcg (100 mcg Intravenous Given 08/03/16 1542)    Vitals:   08/03/16 1715 08/03/16 1720 08/03/16 1725 08/03/16 1730  BP: 112/71 98/77 110/78   Pulse: 98 95 95   Resp: _0 Temp:    97.9 F (36.6 C)  TempSrc:    Rectal  SpO2: 100% 100% 100%   Weight:      Height:        Final diagnoses:  Glasgow coma scale total score 3-8, at arrival to emergency department [facility])  Lactic acidosis    Admission/ observation were discussed with the admitting physician, patient and/or family and they are comfortable with the plan.    Final Clinical Impressions(s) / ED Diagnoses   Final diagnoses:  Glasgow coma scale total score 3-8, at arrival to emergency department [facility])  Lactic acidosis    New Prescriptions New Prescriptions   No medications on file     Deno Etienne, DO 08/03/16 1756

## 2016-08-03 NOTE — Consult Note (Signed)
Neurology Consult Note  Reason for Consultation: Acute change in mental status, ? seizure  Requesting provider: Deno Etienne, MD  CC: Unable to obtain as the patient is presently intubated and sedated  HPI: This is a 36 year old man with a known history of seizure disorder and Behcet's disease who presented to the emergency department today with alteration in mental status. History is obtained from his mother who is present at the bedside. I've also reviewed his medical record at length. The patient is presently intubated and sedated and cannot provide information.  His mother reports that they were at lunch when the patient stated that he did not feel well. She states that when he tried to get up and walk he seemed to stumble a little bit but did not fall. She became concerned because this appeared similar to an episode about one month ago where he had sudden onset of confusion for which he was admitted to [facility] for one day. She decided to bring the patient to the emergency department for evaluation. According to the triage nurse's note, she noted that he had decreased responsiveness on his arrival with "staring gaze." He was also noted to have tremor involving the right leg that lasted about 2 minutes. He was able to tell the nurse that "I just don't feel well," after which he became unresponsive to verbal and noxious stimuli. She notified the emergency room doctor who evaluated the patient immediately. He documented the patient to have a GCS score of 6 (E4 V1 M1) with mildly unequal pupils, left slightly larger on the right, both reactive. He was noted to have four beats of clonus on the right ankle, two on the left. No focal deficits were observed. He was emergently intubated due to his poor mental status, during which he was found to have an absent gag reflex. He was sent for an emergent CT scan of the head which showed no acute abnormality. Shortly after this, he was more responsive and  able to follow some commands. According to his nurse, he was able to write notes to his mother even while he was on 15 mcg/kg/m of propofol. He is being admitted to the ICU and neurology consultation is now requested for assistance regarding his spell today.  His mother states that he has a history of seizures. She is not able to tell me much about what his typical seizure is like. She does state that he usually knows that he is going to have a seizure because he says he doesn't feel right. However, she is not sure what this really means consistently described this feeling further. She otherwise can tell me much more about the spells. She states that he has a seizure about once a month. He is followed by Dr. Margette Fast at [facility]. According to his notes, he has had seizures since 2006 and has been maintained on lamotrigine. These notes do not characterize his seizures; she. He was recently seen in the emergency department on 06/21/16 for a reported seizure. According to this note, he was at a psychiatrist office when he had his seizure. The seizure is not described. He was maintained on his regular dose of lamotrigine and instructed to follow up with Dr. Jannifer Franklin. He was seen by Dr. Jannifer Franklin on 07/06/16 at which time he was complaining of difficulty controlling his right arm and leg. He was also describing feeling slow cognitively, unable to process information normally since November 2017. MRI scan of the brain at  been obtained and reportedly did not show any changes when compared to prior imaging. On examination he was noted "to be cognitively slow, he will repeat himself during the conversation in the office." His neurologic examination was otherwise unremarkable. It was felt that his cognitive concerns and recent seizure could be related to his underlying Behcet's syndrome. His normal pain was increased to 250 mg twice daily. Lumbar puncture was arranged.  On 07/07/16, he presented to the  [facility] emergency department with his mother. His mother reported that he appeared sluggish. She states that he had been falling around the house the night before. When she got to the ED, he fell out of the car and slumped to the ground. Triage nurse noted he was lethargic, pale, and mumbling. He was tachycardic and nonresponsive. He was given Ativan due to presumed seizure though there is no mention of any abnormal motor activity. He was noted to be lethargic and minimally responsive to painful stimuli intermittently. He apparently had some brief eye twitching raising concern that he may be having subclinical seizure activity and was given 1 mg of Ativan. There is some concern that he may benefit intoxicated. He showed some improvement in the emergency department and was admitted for observation. He quickly cleared and was discharged home the next day. He had a lumbar puncture performed as an outpatient on 07/17/16. This showed 0 white blood cells with elevated protein of 84. Per Dr. Jannifer Franklin, this continue to raise concern that his Behcet's disease has been underlying these recent events. Per the note, he was to follow up with his rheumatologist at [place] who manages his Behcet's disease in the next 3 weeks.  Regarding his Behcet's disease, his mother reports that this was diagnosed about 3 years ago at [facility]. I have reviewed the records from [facility] via [facility]. According to his rheumatologist notes there, he was diagnosed in 2014 but had reported having skin and mucosal lesions since 2012 or 2013. He initially been treated with steroids, dapsone, cold just seen, and methotrexate at various points. He received Remicade but this was stopped after 1 infusion because he developed a staphylococcal infection. He's had ocular involvement with his Behcet's described as intermittent eye pain and blurriness on the left for which she has been followed by ophthalmology. His last visit with his  rheumatologist was on 03/20/16 at which time he was stable. He continues to have flares of mouth and general ulcerations about once every 10 days. Current medication regimen includes Otezla 30 mg twice daily and methotrexate 25 mg weekly.  Over the past 4 months he has been experiencing right upper quadrant pain and epigastric pain. He was seen in the GI clinic at [facility]. CT scan of the abdomen was reportedly unrevealing, showing submucosal that the position and the ileum and throughout the colon with mild splenomegaly and abdominal diastases. It was felt that his abdominal discomfort was likely due to chest wall tenderness with no evidence of an acute GI process. He was given a 14 day course of Mobic with instructions to follow up.  PMH:  Past Medical History:  Diagnosis Date  . Behcet's disease ([place])   . Bipolar disorder ([place])   . Hypertension   . Migraine   . Seizures ([place])    last experienced a seizure x 3 years ago  . Vitamin D deficiency     PSH:  Past Surgical History:  Procedure Laterality Date  . APPENDECTOMY    .  FRACTURE SURGERY Right    Arm  . maxilofacial    . NISSEN FUNDOPLICATION      Family history: Family History  Problem Relation Age of Onset  . Lung cancer Paternal Grandfather   . Other Father     BPPV    Social history:  Social History   Social History  . Marital status: Single    Spouse name: N/A  . Number of children: 0  . Years of education: 60   Occupational History  .  Disabled   Social History Main Topics  . Smoking status: Never Smoker  . Smokeless tobacco: Never Used  . Alcohol use No  . Drug use: No  . Sexual activity: Not on file   Other Topics Concern  . Not on file   Social History Narrative  . No narrative on file     Current inpatient meds: Medications reviewed and reconciled.  Current Facility-Administered Medications  Medication Dose Route Frequency Provider Last Rate Last Dose  . 0.9 %  sodium chloride infusion   250 mL Intravenous PRN Rush Farmer, MD      . 0.9 %  sodium chloride infusion   Intravenous Continuous Rush Farmer, MD      . aspirin chewable tablet 324 mg  324 mg Oral NOW Rush Farmer, MD       Or  . aspirin suppository 300 mg  300 mg Rectal NOW Rush Farmer, MD      . calcium gluconate 1 g in sodium chloride 0.9 % 100 mL IVPB  1 g Intravenous Once Rahul P Desai, PA-C      . etomidate (AMIDATE) injection 20 mg  20 mg Intravenous Once Deno Etienne, DO      . fentaNYL 2540mcg in NS 218mL (1mcg/ml) infusion-PREMIX  25-400 mcg/hr Intravenous Continuous Rush Farmer, MD      . folic acid injection 1 mg  1 mg Intravenous Daily Rahul P Desai, PA-C      . heparin injection 5,000 Units  5,000 Units Subcutaneous Q8H Rush Farmer, MD      . metoprolol (LOPRESSOR) injection 2.5-5 mg  2.5-5 mg Intravenous Q3H PRN Rush Farmer, MD      . midazolam (VERSED) 2 MG/2ML injection           . pantoprazole sodium (PROTONIX) 40 mg/20 mL oral suspension 40 mg  40 mg Per Tube Daily Rush Farmer, MD      . propofol (DIPRIVAN) 1000 MG/100ML infusion  0-50 mcg/kg/min Intravenous Continuous Deno Etienne, DO 20.4 mL/hr at 08/03/16 1724 30 mcg/kg/min at 08/03/16 1724  . propofol (DIPRIVAN) 1000 MG/100ML infusion           . succinylcholine (ANECTINE) injection 200 mg  200 mg Intravenous Once Deno Etienne, DO       Current Outpatient Prescriptions  Medication Sig Dispense Refill  . Melatonin 5 MG TABS Take 5 mg by mouth at bedtime.    Marland Kitchen Apremilast (OTEZLA) 30 MG TABS Take 30 mg by mouth 2 (two) times daily.    Marland Kitchen atenolol (TENORMIN) 50 MG tablet Take 1 tablet (50 mg total) by mouth daily. 30 tablet 0  . clonazePAM (KLONOPIN) 0.5 MG tablet Take 1 tablet (0.5 mg total) by mouth 3 (three) times daily. For anxiety 90 tablet 0  . ergocalciferol (VITAMIN D2) 50000 units capsule Take 50,000 Units by mouth every Wednesday.    Marland Kitchen FLUoxetine (PROZAC) 20 MG tablet Take 20 mg by mouth  3 (three) times daily.     . folic  acid (FOLVITE) 1 MG tablet Take 1 mg by mouth daily.     Marland Kitchen lamoTRIgine (LAMICTAL) 200 MG tablet Take 1 tablet (200 mg total) by mouth 2 (two) times daily. 60 tablet 5  . Methotrexate Sodium (METHOTREXATE, PF,) 50 MG/2ML injection Inject 25 mg into the skin every Sunday.    . potassium chloride (K-DUR) 10 MEQ tablet Take 1 tablet (10 mEq total) by mouth daily. 10 tablet 0  . QUEtiapine (SEROQUEL XR) 300 MG 24 hr tablet Take 300 mg by mouth at bedtime.      Allergies: Allergies  Allergen Reactions  . Actos [Pioglitazone] Rash    ROS: As per HPI. A full 14-point review of systems cannot be obtained as the patient is presently intubated and sedated.  PE:  BP 110/78   Pulse 95   Temp 97.9 F (36.6 C) (Rectal)   Resp 17   Ht 6\' 3"  (1.905 m)   Wt 113.4 kg (250 lb)   SpO2 100%   BMI 31.25 kg/m   General: WD obese Caucasian man lying on ED gurney. He is intubated. He is presently sedated with propofol. He is able to open his eyes but did not follow commands for me. He didn't really fix or track before closing his eyes again. There is no evidence of ongoing seizure activity. HEENT: Normocephalic. Neck supple without LAD. Endotracheal tube and orogastric tube are in place. His mucous membranes appear moist. There are no obvious mucosal ulcers in the visualized portions of his mouth. Examination is somewhat limited by endotracheal tube. Sclerae anicteric. No conjunctival injection.  CV: Regular, no murmur. Carotid pulses full and symmetric, no bruits. Distal pulses 2+ and symmetric.  Lungs: CTAB. Ventilated. Abdomen: Soft, obese, non-distended, no rebound or guarding. Bowel sounds present x4.  Extremities: No C/C/E. Neuro:  CN: Pupils are equal and round. They are symmetrically reactive from 3-->2 mm. eyes are conjugate. There is no forced deviation. He has no nystagmus. Oculocephalics are present. Corneal reflexes are present and symmetric. His face appears to be grossly symmetric but is  partly obscured by tubes and tape. The remainder of his cranial nerve examination is limited by inability to cooperate since he is intubated and sedated.  Motor: Normal bulk. Tone is reduced throughout. He is unable to participate with confrontational strength testing. He was observed to move his right arm, reaching up towards his face. At one point, he raised his right hand and made writing motions in the air. No tremor or other abnormal movements.  Sensation: He has weak withdrawal to nailbed pressure 4.  DTRs: 3+, slightly brisker on the right than the left. He has about 5 beats of clonus at the right ankle, no clonus on the left. He has crossed abductors bilaterally. Hoffmann's is present in both hands. Toes are mute bilaterally. Coordination/gait: These cannot be assessed as the patient is unable to participate with the examination at this time.   Labs:  Lab Results  Component Value Date   WBC 6.4 08/03/2016   HGB 14.6 08/03/2016   HCT 43.0 08/03/2016   PLT 171 08/03/2016   GLUCOSE 115 (H) 08/03/2016   TRIG 417 (H) 08/03/2016   ALT 63 08/03/2016   AST 44 (H) 08/03/2016   NA 141 08/03/2016   K 3.9 08/03/2016   CL 105 08/03/2016   CREATININE 1.30 (H) 08/03/2016   BUN 25 (H) 08/03/2016   CO2 23 08/03/2016   INR  1.05 07/08/2016   HGBA1C 5.4 07/08/2016   Serum ethanol less than 5 Serum ammonia 34 Lactate 4.04  From 07/17/16: CSF white blood cells 0 CSF red blood cells 0 CSF protein 84 CSF glucose 60  Imaging:  I have personally and independently reviewed the CT scan of the head without contrast from today. This is unremarkable apart from some mild left maxillary sinus disease and evidence of old fractures with surgical correction involving his facial bones.  I have personally and independently reviewed MRI scan of the brain with and without contrast from 02/06/16. This also shows mild left maxillary sinusitis. The brain itself is unremarkable. No abnormal enhancement is  appreciated.   Assessment and Plan:  1. Probable seizure: History as reported is concerning for seizure. He may have had a complex partial seizure given the description of focal jerking of the right leg and possibly the right arm associated with abrupt decline in level of consciousness. This appears to have resolved and he is certainly not showing any evidence of ongoing seizure activity on my examination right now. He has a known history of seizures, though I'm not sure what his usual seizure looks like as I can't find a good description in the notes and his mother is unable to tell me. At this point, continue supportive care. He takes lamotrigine, with most recent medication list indicating he takes 200 mg twice daily but recent neurology notes indicating that he was to gradually increase this to 250 mg twice daily. For now, I will continue 200 mg twice daily and try to figure out what his actual dose is, adjusting as needed. Continue seizure precautions. Agree with EEG in a.m.  2. Behcet's syndrome: This is a long-standing and chronic diagnosis. Continue outpatient regimen of Otezla and methotrexate. While Behcet's can cause an aseptic meningitis, this does not appear to be the case today. He has no meningismus and at least according to his mother has not been complaining of fever or headache. Can obtain further history from the patient was able to do so. He had a recent lumbar puncture on 07/17/16 which showed isolated elevation in protein, no pleocytosis. This profile is nonspecific but is not consistent with active CNS inflammation.  3. Cognitive impairment: He has been concerned about changes in cognition over the past several months. This can be assessed further once he is extubated and able to provide more information. This is being followed by his outpatient neurologist feels that this is likely the result of his underlying Behcet's.  This was discussed with the patient's mother. Education was  provided on the diagnosis and expected evaluation and treatment. She is in agreement with the plan as noted. She was given the opportunity to ask any questions and these were addressed to her satisfaction.   My impression and recommendations were discussed with Dr. Nelda Marseille, PCCM, at the time of my consult.  Thank you for the consultation. Neurology will continue to follow. Please call with any urgent questions or concerns.

## 2016-08-03 NOTE — ED Notes (Signed)
Prt writing notes to mother. Ask her to wipe his mouth.

## 2016-08-03 NOTE — ED Notes (Signed)
Clothes, shoes, wallet given to mother.

## 2016-08-03 NOTE — ED Triage Notes (Signed)
Pt arrives POV with Mother. Pt is noted as decreased reponsiveness to room via wc with staring gaze. Tremor noted at right leg only lasting approx 2 min. Mom states he was at lunch and then said. I don't feel well. ON arrival pt was able to say, I just don't feel well. Pt then makes no verbal response and does not respond to sternal rub.Dr. Arnette Norris called to room and at bedside

## 2016-08-03 NOTE — ED Notes (Signed)
Abnormal Lactic acid result given to Dr. Tyrone Nine

## 2016-08-03 NOTE — Progress Notes (Signed)
Pt transferred to 4N ICU via vent w/ no apparent complications.  RT report given to ICU RT

## 2016-08-03 NOTE — ED Notes (Signed)
Dr,. Cindi Carbon at bedside.

## 2016-08-03 NOTE — ED Notes (Signed)
MD at bedside wit respiratory preparing to intubate due to unresponsiveness.

## 2016-08-03 NOTE — ED Notes (Signed)
Mother and sister at bedside. Pt holds hands briefly. Brief movement of all extremiteis noted to self reposition as labs are drawn.

## 2016-08-03 NOTE — ED Notes (Signed)
Return from CT

## 2016-08-03 NOTE — ED Notes (Signed)
To ct with rt rn and monitor /vent

## 2016-08-03 NOTE — ED Notes (Signed)
Propofol initiiated with pt movment noted. Mother at bedsdie and hands redirected away from tube.

## 2016-08-03 NOTE — Progress Notes (Signed)
[facility] Progress Note Patient Name: [name] [name] DOB: 1980-07-23 MRN: [id number]   Date of Service  08/03/2016  HPI/Events of Note  Ca++ level from CMP = 9.1.   eICU Interventions  Will D/C order for Calcium gluconate.      Intervention Category Major Interventions: Electrolyte abnormality - evaluation and management  Sommer,Steven Eugene 08/03/2016, 6:46 PM

## 2016-08-03 NOTE — Progress Notes (Signed)
[facility] Progress Note Patient Name: [name] [name] DOB: 07-10-1980 MRN: [id number]   Date of Service  08/03/2016  HPI/Events of Note  Patient is currently on a Propofol and Fentanyl IV infusions for sedation. Triglyceride level = 417.  eICU Interventions  Will order: 1. Titrate the Fentanyl IV infusion for RASS = 0 to -1. 2. D/C Propofol IV infusion.      Intervention Category Major Interventions: Other:  Lysle Dingwall 08/03/2016, 9:29 PM

## 2016-08-04 ENCOUNTER — Inpatient Hospital Stay (HOSPITAL_COMMUNITY): Payer: Medicare Other

## 2016-08-04 DIAGNOSIS — E872 Acidosis, unspecified: Secondary | ICD-10-CM

## 2016-08-04 DIAGNOSIS — G9340 Encephalopathy, unspecified: Secondary | ICD-10-CM

## 2016-08-04 DIAGNOSIS — R569 Unspecified convulsions: Secondary | ICD-10-CM

## 2016-08-04 DIAGNOSIS — J9601 Acute respiratory failure with hypoxia: Secondary | ICD-10-CM

## 2016-08-04 LAB — CBC
HEMATOCRIT: 41.9 % (ref 39.0–52.0)
Hemoglobin: 13.8 g/dL (ref 13.0–17.0)
MCH: 30.5 pg (ref 26.0–34.0)
MCHC: 32.9 g/dL (ref 30.0–36.0)
MCV: 92.5 fL (ref 78.0–100.0)
Platelets: 178 10*3/uL (ref 150–400)
RBC: 4.53 MIL/uL (ref 4.22–5.81)
RDW: 12.8 % (ref 11.5–15.5)
WBC: 7.8 10*3/uL (ref 4.0–10.5)

## 2016-08-04 LAB — PHOSPHORUS: Phosphorus: 2.6 mg/dL (ref 2.5–4.6)

## 2016-08-04 LAB — BLOOD GAS, ARTERIAL
Acid-base deficit: 2.2 mmol/L — ABNORMAL HIGH (ref 0.0–2.0)
Bicarbonate: 20.6 mmol/L (ref 20.0–28.0)
Drawn by: 44166
FIO2: 40
MECHVT: 670 mL
O2 SAT: 98.8 %
PEEP/CPAP: 5 cmH2O
PO2 ART: 142 mmHg — AB (ref 83.0–108.0)
Patient temperature: 98.5
RATE: 16 resp/min
pCO2 arterial: 26.9 mmHg — ABNORMAL LOW (ref 32.0–48.0)
pH, Arterial: 7.497 — ABNORMAL HIGH (ref 7.350–7.450)

## 2016-08-04 LAB — BASIC METABOLIC PANEL
Anion gap: 11 (ref 5–15)
BUN: 19 mg/dL (ref 6–20)
CHLORIDE: 107 mmol/L (ref 101–111)
CO2: 22 mmol/L (ref 22–32)
CREATININE: 1.23 mg/dL (ref 0.61–1.24)
Calcium: 8.7 mg/dL — ABNORMAL LOW (ref 8.9–10.3)
GFR calc Af Amer: >60 mL/min (ref >60)
GFR calc non Af Amer: >60 mL/min (ref >60)
Glucose, Bld: 105 mg/dL — ABNORMAL HIGH (ref 65–99)
POTASSIUM: 3.4 mmol/L — AB (ref 3.5–5.1)
Sodium: 140 mmol/L (ref 135–145)

## 2016-08-04 LAB — MAGNESIUM: Magnesium: 2.4 mg/dL (ref 1.7–2.4)

## 2016-08-04 LAB — LAMOTRIGINE LEVEL: Lamotrigine Lvl: 3.6 ug/mL (ref 2.0–20.0)

## 2016-08-04 MED ORDER — CLONAZEPAM 0.5 MG PO TABS
0.5000 mg | ORAL_TABLET | Freq: Three times a day (TID) | ORAL | Status: DC
  Administered 2016-08-04 – 2016-08-05 (×5): 0.5 mg via ORAL
  Filled 2016-08-04 (×5): qty 1

## 2016-08-04 MED ORDER — TRAMADOL HCL 50 MG PO TABS
50.0000 mg | ORAL_TABLET | Freq: Once | ORAL | Status: AC
  Administered 2016-08-04: 50 mg via ORAL
  Filled 2016-08-04: qty 1

## 2016-08-04 MED ORDER — QUETIAPINE FUMARATE 200 MG PO TABS
200.0000 mg | ORAL_TABLET | Freq: Every day | ORAL | Status: DC
  Administered 2016-08-04 (×2): 200 mg via ORAL
  Filled 2016-08-04 (×3): qty 1

## 2016-08-04 MED ORDER — DEXMEDETOMIDINE HCL 200 MCG/2ML IV SOLN
0.4000 ug/kg/h | INTRAVENOUS | Status: DC
  Administered 2016-08-04: 0.4 ug/kg/h via INTRAVENOUS
  Administered 2016-08-04: 0.5 ug/kg/h via INTRAVENOUS
  Filled 2016-08-04 (×4): qty 2

## 2016-08-04 MED ORDER — BLISTEX MEDICATED EX OINT
TOPICAL_OINTMENT | CUTANEOUS | Status: DC | PRN
  Filled 2016-08-04: qty 6.3

## 2016-08-04 NOTE — Progress Notes (Signed)
PULMONARY / CRITICAL CARE MEDICINE   Name: [name] [name] MRN: [id number] DOB: [birth year]    ADMISSION DATE:  08/03/2016 CONSULTATION DATE:  08/03/16  REFERRING MD:  Tyrone Nine  CHIEF COMPLAINT:  AMS  HISTORY OF PRESENT ILLNESS:  Pt is encephelopathic; therefore, this HPI is obtained from chart review. ESHAAN [name] is a 36 y.o. male with PMH as outlined below including seizures (followed by Dr. Jannifer Franklin of [facility]) and Behcet's Disease (followed by [facility]).  He had seizure in March 2017 with unclear etiology.  Followed up again with Dr. Jannifer Franklin and  Had LP performed 07/17/16 which returned with elevated proteins (84).  On 5/3, he was out to lunch with his mother and was in his usual state of health.  Following lunch, mother states that pt informed her that he felt that was going to have another seizure (supposedly he has the same feelings prior to all seizures).  He then began to foam at the mouth and had jerking of RUE and RLE.  He was brought to [facility] ED where he remained encephalopathic but had no further signs of seizures.  He was subsequently intubated and PCCM was called for admission.  Head CT negative.  Lactic acid 4.  SUBJECTIVE:  Sleepy but off sedation, follows commands.  VITAL SIGNS: BP 96/69   Pulse (!) 58   Temp 98.5 F (36.9 C) (Axillary)   Resp 16   Ht 6\' 3"  (1.905 m)   Wt 243 lb 9.7 oz (110.5 kg)   SpO2 100%   BMI 30.45 kg/m   HEMODYNAMICS:    VENTILATOR SETTINGS: Vent Mode: PSV;CPAP FiO2 (%):  [40 %-100 %] 40 % Set Rate:  [15 bmp-16 bmp] 16 bmp Vt Set:  [670 mL] 670 mL PEEP:  [5 cmH20] 5 cmH20 Pressure Support:  [5 cmH20] 5 cmH20 Plateau Pressure:  [18 cmH20-19 cmH20] 19 cmH20  INTAKE / OUTPUT: I/O last 3 completed shifts: In: 1337.7 [I.V.:1237.7; NG/GT:100] Out: 850 [Urine:850]   PHYSICAL EXAMINATION: General:  WNWDWM on vent, recent sedation HEENT: MM pink/moist/no jvd ZRA:QTMA affect Neuro: Lethargic but follows all commands CV: s1s2 rrr, no  m/r/g PULM: even/non-labored, lungs bilaterally clear UQ:JFHL, non-tender, bsx4 active  Extremities: warm/dry, neg edema  Skin: no rashes or lesions   LABS:  BMET  Recent Labs Lab 08/03/16 1521 08/03/16 1632 08/04/16 0239  NA 137 141 140  K 3.5 3.9 3.4*  CL 104 105 107  CO2 23  --  22  BUN 19 25* 19  CREATININE 1.32* 1.30* 1.23  GLUCOSE 101* 115* 105*    Electrolytes  Recent Labs Lab 08/03/16 1521 08/04/16 0239  CALCIUM 9.1 8.7*  MG  --  2.4  PHOS  --  2.6    CBC  Recent Labs Lab 08/03/16 1500 08/03/16 1632 08/04/16 0239  WBC 6.4  --  7.8  HGB 14.7 14.6 13.8  HCT 44.5 43.0 41.9  PLT 171  --  178    Coag's  Recent Labs Lab 08/03/16 1800  APTT 28  INR 1.03    Sepsis Markers  Recent Labs Lab 08/03/16 1632 08/03/16 1800 08/03/16 1815 08/03/16 2211  LATICACIDVEN 4.04*  --  2.1* 3.1*  PROCALCITON  --  <0.10  --   --     ABG  Recent Labs Lab 08/03/16 1751 08/04/16 0332  PHART 7.427 7.497*  PCO2ART 37.8 26.9*  PO2ART 412.0* 142*    Liver Enzymes  Recent Labs Lab 08/03/16 1521  AST 44*  ALT 63  ALKPHOS 84  BILITOT 0.5  ALBUMIN 4.2    Cardiac Enzymes No results for input(s): TROPONINI, PROBNP in the last 168 hours.  Glucose  Recent Labs Lab 08/03/16 1841  GLUCAP 115*    Imaging Ct Head Wo Contrast  Result Date: 08/03/2016 CLINICAL DATA:  Ct head wo, seizures, unresponsive EXAM: CT HEAD WITHOUT CONTRAST TECHNIQUE: Contiguous axial images were obtained from the base of the skull through the vertex without intravenous contrast. COMPARISON:  07/07/2016 FINDINGS: Brain: No evidence of acute infarction, hemorrhage, hydrocephalus, extra-axial collection or mass lesion/mass effect. Vascular: No hyperdense vessel or unexpected calcification. Skull: No acute abnormality of the calvarium. Sinuses/Orbits: Prior ORIF of multiple facial fractures. Significant mucoperiosteal thickening of the left maxillary sinus. Other: None  IMPRESSION: 1.  No evidence for acute intracranial abnormality. 2. Remote fractures and surgical changes of the facial bones. 3. Left maxillary sinus disease. Electronically Signed   By: Nolon Nations M.D.   On: 08/03/2016 16:51   Dg Chest Port 1 View  Result Date: 08/04/2016 CLINICAL DATA:  Hypoxia EXAM: PORTABLE CHEST 1 VIEW COMPARISON:  Aug 03, 2016 FINDINGS: Endotracheal tube tip is 4.9 cm above the carina. Nasogastric tube tip and side port are below the diaphragm. No pneumothorax. There is patchy atelectasis in the left base. Lungs elsewhere are clear. Heart is upper normal in size with pulmonary vascularity within normal limits. No adenopathy. No bone lesions. IMPRESSION: Tube positions as described without pneumothorax. Patchy atelectasis left base. Lungs elsewhere clear. Stable cardiac silhouette. Electronically Signed   By: Lowella Grip III M.D.   On: 08/04/2016 07:44   Dg Chest Portable 1 View  Result Date: 08/03/2016 CLINICAL DATA:  Status post intubation EXAM: PORTABLE CHEST 1 VIEW COMPARISON:  06/21/2016 FINDINGS: Endotracheal tube and nasogastric catheter are noted in satisfactory position. The heart is within normal limits. The lungs are clear bilaterally. No bony abnormality is seen. IMPRESSION: Tubes and lines as described.  No acute abnormality noted. Electronically Signed   By: Inez Catalina M.D.   On: 08/03/2016 16:02     STUDIES:  Head CT 5/3 > negative. CXR 5/3 > negative.  CULTURES: Blood 5/3 >  Urine 5/3 >  Sputum 5/3 >   ANTIBIOTICS: None.  SIGNIFICANT EVENTS: 5/3 > admit.  LINES/TUBES: 5/3 ET>>  DISCUSSION: 36 y.o. male with hx Seizures and Behcet's Syndrome, admitted 5/3 after seizures and AMS (? Post ictal state).  Required intubation in ED.  ASSESSMENT / PLAN:  NEUROLOGIC A:   Acute encephalopathy - ? Post ictal state vs progressive chronic confusional syndrome secondary to Behcet's (per neuro). Hx seizures (followed by Dr. Jannifer Franklin). P:    Sedation:  Propofol gtt / Fentanyl gtt. Off 5/5 and awake and alert. No further szs. RASS goal: 0 to -1. Daily WUA. Neuro consulted. Assess EEG. AED's per neuro. Hold preadmission clonazepam, fluoxetine, lamotrigine, quetiapine till taking po's  PULMONARY A: VDRF - due to inability to protect the airway in the setting of AMS (? Post ictal). P:   VAP prevention measures. SBT  With extubation 5/5   CARDIOVASCULAR A:  Hx HTN. P:  Lopressor PRN. Trend lactate. Hold preadmission atenolol.  RENAL A:   AKI. Hypocalcemia. 9.1->8.7 corrected calcium 11.56 P:   NS @ 75. 1g Ca gluconate. 5/4 BMP in AM.  GASTROINTESTINAL A:   GI prophylaxis. Nutrition. P:   SUP: Pantoprazole. NPO. Advance diet once extubated  HEMATOLOGIC  Recent Labs  08/03/16 1632 08/04/16 0239  HGB 14.6 13.8  A:   VTE Prophylaxis. P:  SCD's / heparin. CBC   INFECTIOUS A:   No indication of infection. P:   Pan culture for completeness.  PCT -<0.10 therefore no abx  ENDOCRINE A:   No acute issues.   P:   No interventions required.  RHEUMATOLOGIC A: Hx Behcet Syndrome. P: Preadmission otezla not on hospital formulary. Preadmission methotrexate dosed once weekly. Resume once extubated. May need to bring medication in from home.  Family updated: Mother updated by Dr. Nelda Marseille 5/4 , no family 5/5  Interdisciplinary Family Meeting v Palliative Care Meeting:  Due by: 08/10/16.  CC time: 30 min.   Richardson Landry Minor ACNP Maryanna Shape PCCM Pager [phone] till 3 pm If no answer page [phone] 08/04/2016, 8:24 AM  Attending Note:  36 year old male with PMH of seizures and Behcets syndrome presenting post ictal and was intubated for airway protection.  Now much improved.  On exam, lungs are clear to auscultation.  I reviewed CXR myself, ETT is in good position.  Patient is weaning well.  Will proceed with extubation today.  PT/OT and OOB to chair.  Titrate O2 to off.  Diet as tolerated.  If  able to mobilize then will consider d/c in AM.  The patient is critically ill with multiple organ systems failure and requires high complexity decision making for assessment and support, frequent evaluation and titration of therapies, application of advanced monitoring technologies and extensive interpretation of multiple databases.   Critical Care Time devoted to patient care services described in this note is  35  Minutes. This time reflects time of care of this signee Dr Jennet Maduro. This critical care time does not reflect procedure time, or teaching time or supervisory time of PA/NP/Med student/Med Resident etc but could involve care discussion time.  Rush Farmer, M.D. [facility] Pulmonary/Critical Care Medicine. Pager: [phone]. After hours pager: [phone].

## 2016-08-04 NOTE — Progress Notes (Signed)
Upon shift, pain assessment, patient states that he has "sharp, stabbing" pain underneath his ribs. He states "my medicine makes it flare up." Pt rates it an "8" out of 10. Per patient he takes no pain medication at home when this happens, for it is a constant occurrence, and that his primary care providers are aware. This nurse had to step out of room, with returning to room within ten minutes, patient is asleep, resting comfortably.    2230- Nurse into room to empty urinal, patient stating that his night time medicines made his pain flare back up. Requesting pain medication at this time. CCM notified. New orders given and carried out. Will continue to monitor.  Lianne Bushy RN BSN

## 2016-08-04 NOTE — Progress Notes (Signed)
[place] Progress Note Patient Name: [name] [name] DOB: 08-07-1980 MRN: [id number]   Date of Service  08/04/2016  HPI/Events of Note  Contacted by bedside nurse regarding chest discomfort after taking routine medications. Nurse reports patient reported this is similar to previous pain that he has experienced before wall taking his routine medications. Can recheck previously showed patient sleeping and resting comfortably. Patient now requesting medication for pain.   eICU Interventions  Ultram 50 mg by mouth 1.      Intervention Category Intermediate Interventions: Pain - evaluation and management  Tera Partridge 08/04/2016, 10:52 PM

## 2016-08-04 NOTE — Progress Notes (Addendum)
Neurology Progress Note  Subjective: Remained intubated overnight, sedation switched from propofol to fentanyl and Precedex due to elevated serum triglycerides. He is alert for me, able to answer questions appropriately with gestures, nodding/shaking head, and writing. He denies any headache or neck pain/stiffness. He has some RUQ pain but this is chronic. He wants to have his mouth cleaned. He reports that he has had some new skin lesions on both arms as well as three lesions on the roof of his mouth related to his underlying Behcet's syndrome. Remainder of 10-point ROS is negative.   Medications reviewed and reconciled.   Pertinent meds: Precedex infusion 0.7 mcg/kg/hr Fentanyl infusion 50 mcg/hr Lamotrigine 200 mg BID Quetiapine 200 mg qhs Clonazepam 0.5 mg tid  Current Meds:   Current Facility-Administered Medications:  .  0.9 %  sodium chloride infusion, 250 mL, Intravenous, PRN, Rush Farmer, MD .  0.9 %  sodium chloride infusion, , Intravenous, Continuous, Rush Farmer, MD, Last Rate: 75 mL/hr at 08/04/16 0700 .  chlorhexidine gluconate (MEDLINE KIT) (PERIDEX) 0.12 % solution 15 mL, 15 mL, Mouth Rinse, BID, Rush Farmer, MD, 15 mL at 08/03/16 2122 .  clonazePAM (KLONOPIN) tablet 0.5 mg, 0.5 mg, Oral, TID, Kara Mead V, MD, 0.5 mg at 08/04/16 0231 .  dexmedetomidine (PRECEDEX) 200 mcg in sodium chloride 0.9 % 50 mL (4 mcg/mL) infusion, 0.4-1.2 mcg/kg/hr, Intravenous, Titrated, Kara Mead V, MD, Last Rate: 19.1 mL/hr at 08/04/16 0729, 0.7 mcg/kg/hr at 08/04/16 0729 .  fentaNYL 2519mg in NS 2513m(1054mml) infusion-PREMIX, 25-400 mcg/hr, Intravenous, Continuous, WesRush FarmerD, Last Rate: 5 mL/hr at 08/04/16 0700, 50 mcg/hr at 08/04/16 0700 .  folic acid injection 1 mg, 1 mg, Intravenous, Daily, Rahul P Desai, PA-C, 1 mg at 08/03/16 2149 .  heparin injection 5,000 Units, 5,000 Units, Subcutaneous, Q8H, WesRush FarmerD, 5,000 Units at 08/04/16 [phone] lamoTRIgine  (LAMICTAL) tablet 200 mg, 200 mg, Per Tube, BID, TimDarrel ReachD, 200 mg at 08/03/16 2101 .  MEDLINE mouth rinse, 15 mL, Mouth Rinse, 10 times per day, WesRush FarmerD, 15 mL at 08/04/16 [id number] metoprolol (LOPRESSOR) injection 2.5-5 mg, 2.5-5 mg, Intravenous, Q3H PRN, WesRush FarmerD .  pantoprazole sodium (PROTONIX) 40 mg/20 mL oral suspension 40 mg, 40 mg, Per Tube, Daily, WesRush FarmerD .  QUEtiapine (SEROQUEL) tablet 200 mg, 200 mg, Oral, QHS, RakKara Mead MD, 200 mg at 08/04/16 0220  Objective:  Temp:  [97.9 F (36.6 C)-98.6 F (37 C)] 98.5 F (36.9 C) (05/04 0300) Pulse Rate:  [57-143] 58 (05/04 0700) Resp:  [14-32] 16 (05/04 0700) BP: (91-151)/(59-114) 94/64 (05/04 0700) SpO2:  [96 %-100 %] 100 % (05/04 0700) FiO2 (%):  [40 %-100 %] 40 % (05/04 0700) Weight:  [109.4 kg (241 lb 2.9 oz)-113.4 kg (250 lb)] 110.5 kg (243 lb 9.7 oz) (05/04 0500)  General: WDWN lying in bed in NAD. He is intubated on Precedex and fentanyl infusions. He opens his eyes to voice and will follow commands easily. He is able to communicate appropriately with writing, gestures, and nodding/shaking head.  HEENT: Neck is supple without lymphadenopathy. ETT and OGT in place. Sclerae are anicteric. There is no conjunctival injection.  CV: Regular, no murmur. Carotid pulses are 2+ and symmetric with no bruits. Distal pulses 2+ and symmetric.  Lungs: CTAB on anterior exam. Ventilated.  Extremities: No C/C/E. Mitten restraint in place on L hand.  Neuro: MS: As noted above.  CN: Pupils are equal and reactive from 3-->2 mm bilaterally. EOMI, no nystagmus. Facial sensation is intact to light touch. Face is symmetric at rest with normal strength and mobility. Hearing is intact to conversational voice. Bilateral SCM and trapezii are 5/5. Tongue protrudes  midline as much as possible with tubes in place. The remainder of his cranial nerve exam is limited by ETT.  Motor: Normal bulk, tone, and strength  throughout. No tremor or other abnormal movements are observed.  Sensation: Intact to light touch.  DTRs: 3+, brisker on R. He has 4-5 beats of clonus at the R ankle, no clonus on the L. Toes are downgoing bilaterally. Hoffman's is present bilaterally.  Coordination: No dysmetria.  Gait: Deferred at this time as on ventilator with sedation.   Labs: Lab Results  Component Value Date   WBC 7.8 08/04/2016   HGB 13.8 08/04/2016   HCT 41.9 08/04/2016   PLT 178 08/04/2016   GLUCOSE 105 (H) 08/04/2016   TRIG 417 (H) 08/03/2016   ALT 63 08/03/2016   AST 44 (H) 08/03/2016   NA 140 08/04/2016   K 3.4 (L) 08/04/2016   CL 107 08/04/2016   CREATININE 1.23 08/04/2016   BUN 19 08/04/2016   CO2 22 08/04/2016   INR 1.03 08/03/2016   HGBA1C 5.4 07/08/2016   CBC Latest Ref Rng & Units 08/04/2016 08/03/2016 08/03/2016  WBC 4.0 - 10.5 K/uL 7.8 - 6.4  Hemoglobin 13.0 - 17.0 g/dL 13.8 14.6 14.7  Hematocrit 39.0 - 52.0 % 41.9 43.0 44.5  Platelets 150 - 400 K/uL 178 - 171    Lab Results  Component Value Date   HGBA1C 5.4 07/08/2016   Lab Results  Component Value Date   ALT 63 08/03/2016   AST 44 (H) 08/03/2016   ALKPHOS 84 08/03/2016   BILITOT 0.5 08/03/2016   Mg 2.4 Phos 2.6 Lactate 4.04--2.1--3.1  Radiology:  There is no new neuroimaging for review.   A/P:   1. Seizure: Presentation is most suggestive of complex partial seizure. I will need to speak with him more about his seizure history once he is extubated. He has not had any reported seizures since the ED yesterday. Continue lamotrigine 200 mg BID. Seizure precautions. EEG today.   2. Acute encephalopathy: This was likely due to seizure and postictal state. This has resolved and he is currently alert and appropriate. Extubate when able, PCCM following. Continue to optimize metabolic status as you are. Minimize sedating medications as much as possible.   3. Behcet's syndrome: He reports some recent skin and oral mucosal lesions. This is  being managed by rheumatology at [facility] with current regimen consisting of Otezla and methotrexate. Continue home regimen. He does not appear to have evidence of an acute aseptic meningitis or CNS vasculitis, which can occur in patients with Behcet's so do not think LP is indicated at this time. Continue to follow.   4. Cognitive impairment: He has reported to his outpatient neurologist that he has had some difficulty with cognition over the past few months. This has been attributed to his Behcet's. I will investigate this further once extubated and better able to participate with assessment.   No family was present at the time of my visit.   Melba Coon, MD [facility]

## 2016-08-04 NOTE — Progress Notes (Signed)
EEG completed, results pending. 

## 2016-08-04 NOTE — Procedures (Signed)
Extubation Procedure Note  Patient Details:   Name: [name] [name] DOB: [birth year] MRN: [id number]   Airway Documentation:  Airway 7.5 mm (Active)  Secured at (cm) 23 cm 08/04/2016  8:01 AM  Measured From Lips 08/04/2016  8:01 AM  Secured Location Left 08/04/2016  8:01 AM  Secured By [employer] 08/04/2016  8:01 AM  Tube Holder Repositioned Yes 08/04/2016  8:01 AM  Site Condition Dry 08/04/2016  8:01 AM    Evaluation  O2 sats: stable throughout Complications: No apparent complications Patient did tolerate procedure well. Bilateral Breath Sounds: Clear   Yes  Elsie Stain 08/04/2016, 9:17 AM

## 2016-08-04 NOTE — Procedures (Signed)
Electroencephalogram (EEG) Report  Date of study: 08/04/16  Requesting clinician: Jennet Maduro, MD  Reason for study: Evaluate for seizure  Brief clinical history: This is a 36 year old man admitted after her unresponsiveness concerning for seizure. EEG is being performed for further evaluation.  Medications:  Current Facility-Administered Medications:  .  0.9 %  sodium chloride infusion, 250 mL, Intravenous, PRN, Rush Farmer, MD .  0.9 %  sodium chloride infusion, , Intravenous, Continuous, Grace Bushy Minor, NP, Last Rate: 50 mL/hr at 08/04/16 0842 .  clonazePAM (KLONOPIN) tablet 0.5 mg, 0.5 mg, Oral, TID, Kara Mead V, MD, 0.5 mg at 08/04/16 1830 .  folic acid injection 1 mg, 1 mg, Intravenous, Daily, Rahul P Desai, PA-C, 1 mg at 08/04/16 1147 .  heparin injection 5,000 Units, 5,000 Units, Subcutaneous, Q8H, Rush Farmer, MD, 5,000 Units at 08/04/16 1356 .  lamoTRIgine (LAMICTAL) tablet 200 mg, 200 mg, Per Tube, BID, Darrel Reach, MD, 200 mg at 08/04/16 1146 .  lip balm (BLISTEX) ointment, , Topical, PRN, Rush Farmer, MD .  metoprolol (LOPRESSOR) injection 2.5-5 mg, 2.5-5 mg, Intravenous, Q3H PRN, Rush Farmer, MD .  pantoprazole sodium (PROTONIX) 40 mg/20 mL oral suspension 40 mg, 40 mg, Per Tube, Daily, Rush Farmer, MD .  QUEtiapine (SEROQUEL) tablet 200 mg, 200 mg, Oral, QHS, Kara Mead V, MD, 200 mg at 08/04/16 0220  Description: This is a routine EEG performed using standard international 10-20 electrode placement. A total of 18 channels are recorded, including one for the EKG. The patient is awake during this recording. Drowsiness is very briefly observed. Sleep was not attained.  Activating Maneuvers: Photic stimulation  Findings:  The EKG channel demonstrates a regular rhythm with a rate of 90 beats per minute.   The background consists primarily of alpha activity. Voltages are slightly reduced and there is a mild degree of intermixed beta activity that has  a frontal predominance. The best dominant posterior rhythm is 9-10 hertz. This is symmetric and reacts as expected with eye opening.   There are no focal asymmetries. No epileptiform discharges are present. No seizures are recorded.   Drowsiness is briefly recorded and is normal in appearance.   Impression:  This is a normal awake and drowsy EEG.  Clinical correlation: This normal EEG does not preclude a diagnosis of seizure or epilepsy. Clinical correlation is recommended.   Melba Coon, MD [facility]

## 2016-08-04 NOTE — Progress Notes (Signed)
Pt extubated per protocol to a 4L [place], Pt followed direction and leak test was negative, Pt was able to cough and talk after extubation and vital signs are normal. RT will conttnue to monitor.

## 2016-08-04 NOTE — Progress Notes (Signed)
[place] Progress Note Patient Name: [name] [name] DOB: September 21, 1980 MRN: [id number]   Date of Service  08/04/2016  HPI/Events of Note  On m ax fent  Gtt, hopefult to extubate in am  eICU Interventions  Use precedex gtt Resume home meds- clonazepam & seroquel     Intervention Category Major Interventions: Delirium, psychosis, severe agitation - evaluation and management  ALVA,RAKESH V. 08/04/2016, 2:01 AM

## 2016-08-05 LAB — BASIC METABOLIC PANEL
Anion gap: 13 (ref 5–15)
BUN: 11 mg/dL (ref 6–20)
CHLORIDE: 102 mmol/L (ref 101–111)
CO2: 23 mmol/L (ref 22–32)
Calcium: 8.7 mg/dL — ABNORMAL LOW (ref 8.9–10.3)
Creatinine, Ser: 1.13 mg/dL (ref 0.61–1.24)
GFR calc non Af Amer: >60 mL/min (ref >60)
Glucose, Bld: 119 mg/dL — ABNORMAL HIGH (ref 65–99)
Potassium: 3.1 mmol/L — ABNORMAL LOW (ref 3.5–5.1)
SODIUM: 138 mmol/L (ref 135–145)

## 2016-08-05 LAB — PHOSPHORUS: PHOSPHORUS: 2.7 mg/dL (ref 2.5–4.6)

## 2016-08-05 LAB — MAGNESIUM: MAGNESIUM: 2 mg/dL (ref 1.7–2.4)

## 2016-08-05 MED ORDER — FOLIC ACID 1 MG PO TABS: 1.0000 mg | ORAL_TABLET | Freq: Every day | ORAL | Status: DC

## 2016-08-05 MED ORDER — POTASSIUM CHLORIDE CRYS ER 20 MEQ PO TBCR
40.0000 meq | EXTENDED_RELEASE_TABLET | Freq: Once | ORAL | Status: AC
  Administered 2016-08-05: 40 meq via ORAL
  Filled 2016-08-05: qty 2

## 2016-08-05 MED ORDER — FOLIC ACID 1 MG PO TABS: 1.0000 mg | ORAL_TABLET | Freq: Every day | ORAL | 1 refills | Status: DC

## 2016-08-05 NOTE — Progress Notes (Signed)
PULMONARY / CRITICAL CARE MEDICINE   Name: [name] [name] MRN: [id number] DOB: 09/23/1980    ADMISSION DATE:  08/03/2016 CONSULTATION DATE:  08/03/16  REFERRING MD:  Tyrone Nine  CHIEF COMPLAINT:  AMS  HISTORY OF PRESENT ILLNESS:  Pt is encephelopathic; therefore, this HPI is obtained from chart review. [name] [name] is a 36 y.o. male with PMH as outlined below including seizures (followed by Dr. Jannifer Franklin of [facility]) and Behcet's Disease (followed by [facility]).  He had seizure in March 2017 with unclear etiology.  Followed up again with Dr. Jannifer Franklin and  Had LP performed 07/17/16 which returned with elevated proteins (84).  On 5/3, he was out to lunch with his mother and was in his usual state of health.  Following lunch, mother states that pt informed her that he felt that was going to have another seizure (supposedly he has the same feelings prior to all seizures).  He then began to foam at the mouth and had jerking of RUE and RLE.  He was brought to [facility] ED where he remained encephalopathic but had no further signs of seizures.  He was subsequently intubated and PCCM was called for admission.  Head CT negative.  Lactic acid 4.  SUBJECTIVE:  Awake and alert, wants to go home.  VITAL SIGNS: BP 114/74 (BP Location: Right Arm)   Pulse 90   Temp 98.5 F (36.9 C) (Oral)   Resp (!) 25   Ht 6\' 3"  (1.905 m)   Wt 236 lb 15.9 oz (107.5 kg)   SpO2 96%   BMI 29.62 kg/m   HEMODYNAMICS:    VENTILATOR SETTINGS:    INTAKE / OUTPUT: I/O last 3 completed shifts: In: 1951.7 [I.V.:1851.7; NG/GT:100] Out: 2475 [Urine:2475]   PHYSICAL EXAMINATION: General:   Male NAD HEENT: MM pink/moist PSY:Nl affect Neuro: Intact CV: s1s2 rrr, no m/r/g PULM: even/non-labored, lungs bilaterally CTA ER:DEYC, non-tender, bsx4 active , eating Extremities: warm/dry, negedema  Skin: no rashes or lesions    LABS:  BMET  Recent Labs Lab 08/03/16 1521 08/03/16 1632 08/04/16 0239 08/05/16 0411  NA 137 141  140 138  K 3.5 3.9 3.4* 3.1*  CL 104 105 107 102  CO2 23  --  22 23  BUN 19 25* 19 11  CREATININE 1.32* 1.30* 1.23 1.13  GLUCOSE 101* 115* 105* 119*    Electrolytes  Recent Labs Lab 08/03/16 1521 08/04/16 0239 08/05/16 0411  CALCIUM 9.1 8.7* 8.7*  MG  --  2.4 2.0  PHOS  --  2.6 2.7    CBC  Recent Labs Lab 08/03/16 1500 08/03/16 1632 08/04/16 0239  WBC 6.4  --  7.8  HGB 14.7 14.6 13.8  HCT 44.5 43.0 41.9  PLT 171  --  178    Coag's  Recent Labs Lab 08/03/16 1800  APTT 28  INR 1.03    Sepsis Markers  Recent Labs Lab 08/03/16 1632 08/03/16 1800 08/03/16 1815 08/03/16 2211  LATICACIDVEN 4.04*  --  2.1* 3.1*  PROCALCITON  --  <0.10  --   --     ABG  Recent Labs Lab 08/03/16 1751 08/04/16 0332  PHART 7.427 7.497*  PCO2ART 37.8 26.9*  PO2ART 412.0* 142*    Liver Enzymes  Recent Labs Lab 08/03/16 1521  AST 44*  ALT 63  ALKPHOS 84  BILITOT 0.5  ALBUMIN 4.2    Cardiac Enzymes No results for input(s): TROPONINI, PROBNP in the last 168 hours.  Glucose  Recent Labs Lab 08/03/16 1841  GLUCAP 115*    Imaging No results found.   STUDIES:  Head CT 5/3 > negative. CXR 5/3 > negative.  CULTURES: Blood 5/3 >  Urine 5/3 >  Sputum 5/3 >   ANTIBIOTICS: None.  SIGNIFICANT EVENTS: 5/3 > admit.  LINES/TUBES: 5/3 ET>>5/4  DISCUSSION: 36 y.o. male with hx Seizures and Behcet's Syndrome, admitted 5/3 after seizures and AMS (? Post ictal state).  Required intubation in ED. Extubated 5/4. Normal EEG.  ASSESSMENT / PLAN:  NEUROLOGIC A:   Acute encephalopathy - ? Post ictal state vs progressive chronic confusional syndrome secondary to Behcet's (per neuro).Resolved Hx seizures (followed by Dr. Jannifer Franklin). Resolved P:    Neuro consulted and input noted  EEG. nl AED's per neuro. Resume outpatient medications as noted. DC home 5/5 Needs to see Dr. Jannifer Franklin of Neurology in 7-10 days  PULMONARY A: VDRF - due to inability to  protect the airway in the setting of AMS (? Post ictal). P:   Resolved post extubation 5/4 Ready for DC home No need to follow up with PCCM    CARDIOVASCULAR A:  Hx HTN. P:  Lopressor PRN. Trend lactate. Hold preadmission atenolol.  RENAL Lab Results  Component Value Date   CREATININE 1.13 08/05/2016   CREATININE 1.23 08/04/2016   CREATININE 1.30 (H) 08/03/2016    A:   AKI. Hypocalcemia. 9.1->8.7 corrected calcium 11.56 Hypokalemia P:   Corrected Replete K+ prior to DC  GASTROINTESTINAL A:   GI prophylaxis. Nutrition. P:   SUP: Pantoprazole. Advanced diet  HEMATOLOGIC  Recent Labs  08/03/16 1632 08/04/16 0239  HGB 14.6 13.8    A:   VTE Prophylaxis. P:  SCD's / heparin. Stopped on dc CBC   INFECTIOUS A:   No indication of infection. P:   Pan culture for completeness.  PCT -<0.10 therefore no abx  ENDOCRINE A:   No acute issues.   P:   No interventions required.  RHEUMATOLOGIC A: Hx Behcet Syndrome. P: Preadmission otezla not on hospital formulary. Preadmission methotrexate dosed once weekly. Resume home medications. Follow up with [facility] already scheduled  Family updated: Mother updated by Dr. Nelda Marseille 5/4 , no family 5/5  Interdisciplinary Family Meeting v Palliative Care Meeting:  Due by: 08/10/16.     Richardson Landry Minor ACNP [facility] PCCM Pager [phone] till 3 pm If no answer page [phone] 08/05/2016, 9:24 AM   ATTENDING NOTE / ATTESTATION NOTE :   I have discussed the case with the resident/APP  Richardson Landry Minor NP  I agree with the resident/APP's  history, physical examination, assessment, and plans.    I have edited the above note and modified it according to our agreed history, physical examination, assessment and plan.   Briefly, [name] [name] is a 36 y.o. male with PMH as outlined below including seizures (followed by Dr. Jannifer Franklin of [facility]) and Behcet's Disease (followed by [facility]).  He had seizure in March 2017 with unclear  etiology.  Followed up again with Dr. Jannifer Franklin and  Had LP performed 07/17/16 which returned with elevated proteins (84).  On 5/3, he was out to lunch with his mother and was in his usual state of health.  Following lunch, mother states that pt informed her that he felt that was going to have another seizure (supposedly he has the same feelings prior to all seizures).  He then began to foam at the mouth and had jerking of RUE and RLE.  He was brought to [facility] ED where he remained encephalopathic  but had no further signs of seizures.  He was subsequently intubated and PCCM was called for admission. CT head was (-).  He was extubated on 5/4 and has done well.  He is at baseline and wants to go home. (-) recurrence of szes.   Vitals:  Vitals:   08/05/16 0500 08/05/16 0600 08/05/16 0800 08/05/16 1146  BP: 107/66 (!) 88/60 114/74   Pulse: 90 91 90   Resp: 19 20 (!) 25   Temp:   98.5 F (36.9 C) 98.2 F (36.8 C)  TempSrc:   Oral Oral  SpO2: 94% 97% 96%   Weight: 107.5 kg (236 lb 15.9 oz)     Height:        Constitutional/General: well-nourished, well-developed,  not in any distress. Comfortable, oriented x 3  Body mass index is 29.62 kg/m. Wt Readings from Last 3 Encounters:  08/05/16 107.5 kg (236 lb 15.9 oz)  07/07/16 108.4 kg (239 lb)  07/06/16 108.4 kg (239 lb)    HEENT: PERLA, anicteric sclerae. (-) Oral thrush.   Neck: No masses. Midline trachea. No JVD, (-) LAD. (-) bruits appreciated.  Respiratory/Chest: Grossly normal chest. (-) deformity. (-) Accessory muscle use.  Symmetric expansion. Diminished BS on both lower lung zones. (-) wheezing, crackles, rhonchi (-) egophony  Cardiovascular: Regular rate and  rhythm, heart sounds normal, no murmur or gallops,  Trace peripheral edema  Gastrointestinal:  Normal bowel sounds. Soft, non-tender. No hepatosplenomegaly.  (-) masses.   Musculoskeletal:  Normal muscle tone.   Extremities: Grossly normal. (-) clubbing, cyanosis.  (-)  edema  Skin: (-) rash,lesions seen.   Neurological/Psychiatric : sedated, intubated. CN grossly intact. (-) lateralizing signs.     CBC Recent Labs     08/03/16  1500  08/03/16  1632  08/04/16  0239  WBC  6.4   --   7.8  HGB  14.7  14.6  13.8  HCT  44.5  43.0  41.9  PLT  171   --   178    Coag's Recent Labs     08/03/16  1800  APTT  28  INR  1.03    BMET Recent Labs     08/03/16  1521  08/03/16  1632  08/04/16  0239  08/05/16  0411  NA  137  141  140  138  K  3.5  3.9  3.4*  3.1*  CL  104  105  107  102  CO2  23   --   22  23  BUN  19  25*  19  11  CREATININE  1.32*  1.30*  1.23  1.13  GLUCOSE  101*  115*  105*  119*    Electrolytes Recent Labs     08/03/16  1521  08/04/16  0239  08/05/16  0411  CALCIUM  9.1  8.7*  8.7*  MG   --   2.4  2.0  PHOS   --   2.6  2.7    Sepsis Markers Recent Labs     08/03/16  1800  PROCALCITON  <0.10    ABG Recent Labs     08/03/16  1751  08/04/16  0332  PHART  7.427  7.497*  PCO2ART  37.8  26.9*  PO2ART  412.0*  142*    Liver Enzymes Recent Labs     08/03/16  1521  AST  44*  ALT  63  ALKPHOS  84  BILITOT  0.5  ALBUMIN  4.2  Cardiac Enzymes No results for input(s): TROPONINI, PROBNP in the last 72 hours.  Glucose Recent Labs     08/03/16  1841  GLUCAP  115*    Imaging Ct Head Wo Contrast  Result Date: 08/03/2016 CLINICAL DATA:  Ct head wo, seizures, unresponsive EXAM: CT HEAD WITHOUT CONTRAST TECHNIQUE: Contiguous axial images were obtained from the base of the skull through the vertex without intravenous contrast. COMPARISON:  07/07/2016 FINDINGS: Brain: No evidence of acute infarction, hemorrhage, hydrocephalus, extra-axial collection or mass lesion/mass effect. Vascular: No hyperdense vessel or unexpected calcification. Skull: No acute abnormality of the calvarium. Sinuses/Orbits: Prior ORIF of multiple facial fractures. Significant mucoperiosteal thickening of the left maxillary sinus.  Other: None IMPRESSION: 1.  No evidence for acute intracranial abnormality. 2. Remote fractures and surgical changes of the facial bones. 3. Left maxillary sinus disease. Electronically Signed   By: Nolon Nations M.D.   On: 08/03/2016 16:51   Dg Chest Port 1 View  Result Date: 08/04/2016 CLINICAL DATA:  Hypoxia EXAM: PORTABLE CHEST 1 VIEW COMPARISON:  Aug 03, 2016 FINDINGS: Endotracheal tube tip is 4.9 cm above the carina. Nasogastric tube tip and side port are below the diaphragm. No pneumothorax. There is patchy atelectasis in the left base. Lungs elsewhere are clear. Heart is upper normal in size with pulmonary vascularity within normal limits. No adenopathy. No bone lesions. IMPRESSION: Tube positions as described without pneumothorax. Patchy atelectasis left base. Lungs elsewhere clear. Stable cardiac silhouette. Electronically Signed   By: Lowella Grip III M.D.   On: 08/04/2016 07:44   Dg Chest Portable 1 View  Result Date: 08/03/2016 CLINICAL DATA:  Status post intubation EXAM: PORTABLE CHEST 1 VIEW COMPARISON:  06/21/2016 FINDINGS: Endotracheal tube and nasogastric catheter are noted in satisfactory position. The heart is within normal limits. The lungs are clear bilaterally. No bony abnormality is seen. IMPRESSION: Tubes and lines as described.  No acute abnormality noted. Electronically Signed   By: Inez Catalina M.D.   On: 08/03/2016 16:02     Assessment: Behcets syndrome S/P Acute respiratory failure 2/2 inability to protect airway when he was post ictal. Resolved.   Plan : Noted plans to go home today. Continue outpt meds (lamictal + otezla + MTX + BP meds) Follow up with Neurology    Family :No family at bedside. Waiting on family to give instructions for discharge.    Monica Becton, MD 08/05/2016, 12:22 PM [facility] Pager [phone] After 3 pm or if no answer, call [phone]

## 2016-08-05 NOTE — Discharge Summary (Signed)
Physician Discharge Summary  Patient ID: [name] [name] MRN: [id number] DOB/AGE: [birth year] 38 y.o.  Admit date: 08/03/2016 Discharge date: 08/05/2016  Problem List Active Problems:   Post-ictal coma (HCC)   Lactic acidosis   Acute respiratory failure with hypoxia (HCC)   Seizures (HCC)  HPI: [name] [name] is a 36 y.o. male with PMH as outlined below including seizures (followed by Dr. Jannifer Franklin of [facility]) and Behcet's Disease (followed by [facility]).  He had seizure in March 2017 with unclear etiology.  Followed up again with Dr. Jannifer Franklin and  Had LP performed 07/17/16 which returned with elevated proteins (84).  On 5/3, he was out to lunch with his mother and was in his usual state of health.  Following lunch, mother states that pt informed her that he felt that was going to have another seizure (supposedly he has the same feelings prior to all seizures).  He then began to foam at the mouth and had jerking of RUE and RLE.  He was brought to [facility] ED where he remained encephalopathic but had no further signs of seizures.  He was subsequently intubated and PCCM was called for admission.  Head CT negative.  Lactic acid 4.  Hospital Course:  INTAKE / OUTPUT: I/O last 3 completed shifts: In: 1951.7 [I.V.:1851.7; NG/GT:100] Out: 2475 [Urine:2475]   PHYSICAL EXAMINATION: General:   Male NAD HEENT: MM pink/moist PSY:Nl affect Neuro: Intact CV: s1s2 rrr, no m/r/g PULM: even/non-labored, lungs bilaterally CTA RW:ERXV, non-tender, bsx4 active , eating Extremities: warm/dry, negedema  Skin: no rashes or lesions  STUDIES:  Head CT 5/3 > negative. CXR 5/3 > negative.  CULTURES: Blood 5/3 >  Urine 5/3 >  Sputum 5/3 >   ANTIBIOTICS: None.  SIGNIFICANT EVENTS: 5/3 > admit.  LINES/TUBES: 5/3 ET>>5/4  DISCUSSION: 36 y.o. male with hx Seizures and Behcet's Syndrome, admitted 5/3 after seizures and AMS (? Post ictal state).  Required intubation in ED. Extubated 5/4. Normal  EEG.  ASSESSMENT / PLAN:  NEUROLOGIC A:   Acute encephalopathy - ? Post ictal state vs progressive chronic confusional syndrome secondary to Behcet's (per neuro).Resolved Hx seizures (followed by Dr. Jannifer Franklin). Resolved P:    Neuro consulted and input noted  EEG. nl AED's per neuro. Resume outpatient medications as noted. DC home 5/5 Needs to see Dr. Jannifer Franklin of Neurology in 7-10 days  PULMONARY A: VDRF - due to inability to protect the airway in the setting of AMS (? Post ictal). P:   Resolved post extubation 5/4 Ready for DC home No need to follow up with PCCM    CARDIOVASCULAR A:  Hx HTN. P:  Lopressor PRN. Trend lactate. Hold preadmission atenolol.  RENAL Labs (Brief)       Lab Results  Component Value Date   CREATININE 1.13 08/05/2016   CREATININE 1.23 08/04/2016   CREATININE 1.30 (H) 08/03/2016      A:   AKI. Hypocalcemia. 9.1->8.7 corrected calcium 11.56 Hypokalemia P:   Corrected Replete K+ prior to DC  GASTROINTESTINAL A:   GI prophylaxis. Nutrition. P:   SUP: Pantoprazole. Advanced diet  HEMATOLOGIC  Recent Labs (last 2 labs)    Recent Labs  08/03/16 1632 08/04/16 0239  HGB 14.6 13.8      A:   VTE Prophylaxis. P:  SCD's / heparin. Stopped on dc CBC   INFECTIOUS A:   No indication of infection. P:   Pan culture for completeness.  PCT -<0.10 therefore no abx  ENDOCRINE A:   No  acute issues.   P:   No interventions required.  RHEUMATOLOGIC A: Hx Behcet Syndrome. P: Preadmission otezla not on hospital formulary. Preadmission methotrexate dosed once weekly. Resume home medications. Follow up with [facility] already scheduled   Labs at discharge Lab Results  Component Value Date   CREATININE 1.13 08/05/2016   BUN 11 08/05/2016   NA 138 08/05/2016   K 3.1 (L) 08/05/2016   CL 102 08/05/2016   CO2 23 08/05/2016   Lab Results  Component Value Date   WBC 7.8 08/04/2016   HGB 13.8 08/04/2016    HCT 41.9 08/04/2016   MCV 92.5 08/04/2016   PLT 178 08/04/2016   Lab Results  Component Value Date   ALT 63 08/03/2016   AST 44 (H) 08/03/2016   ALKPHOS 84 08/03/2016   BILITOT 0.5 08/03/2016   Lab Results  Component Value Date   INR 1.03 08/03/2016   INR 1.05 07/08/2016   INR 1.0 04/28/2008    Current radiology studies Ct Head Wo Contrast  Result Date: 08/03/2016 CLINICAL DATA:  Ct head wo, seizures, unresponsive EXAM: CT HEAD WITHOUT CONTRAST TECHNIQUE: Contiguous axial images were obtained from the base of the skull through the vertex without intravenous contrast. COMPARISON:  07/07/2016 FINDINGS: Brain: No evidence of acute infarction, hemorrhage, hydrocephalus, extra-axial collection or mass lesion/mass effect. Vascular: No hyperdense vessel or unexpected calcification. Skull: No acute abnormality of the calvarium. Sinuses/Orbits: Prior ORIF of multiple facial fractures. Significant mucoperiosteal thickening of the left maxillary sinus. Other: None IMPRESSION: 1.  No evidence for acute intracranial abnormality. 2. Remote fractures and surgical changes of the facial bones. 3. Left maxillary sinus disease. Electronically Signed   By: Nolon Nations M.D.   On: 08/03/2016 16:51   Dg Chest Port 1 View  Result Date: 08/04/2016 CLINICAL DATA:  Hypoxia EXAM: PORTABLE CHEST 1 VIEW COMPARISON:  Aug 03, 2016 FINDINGS: Endotracheal tube tip is 4.9 cm above the carina. Nasogastric tube tip and side port are below the diaphragm. No pneumothorax. There is patchy atelectasis in the left base. Lungs elsewhere are clear. Heart is upper normal in size with pulmonary vascularity within normal limits. No adenopathy. No bone lesions. IMPRESSION: Tube positions as described without pneumothorax. Patchy atelectasis left base. Lungs elsewhere clear. Stable cardiac silhouette. Electronically Signed   By: Lowella Grip III M.D.   On: 08/04/2016 07:44   Dg Chest Portable 1 View  Result Date:  08/03/2016 CLINICAL DATA:  Status post intubation EXAM: PORTABLE CHEST 1 VIEW COMPARISON:  06/21/2016 FINDINGS: Endotracheal tube and nasogastric catheter are noted in satisfactory position. The heart is within normal limits. The lungs are clear bilaterally. No bony abnormality is seen. IMPRESSION: Tubes and lines as described.  No acute abnormality noted. Electronically Signed   By: Inez Catalina M.D.   On: 08/03/2016 16:02    Disposition:  01-Home or Self Care   Allergies as of 08/05/2016      Reactions   Actos [pioglitazone] Rash      Medication List    TAKE these medications   atenolol 50 MG tablet Commonly known as:  TENORMIN Take 1 tablet (50 mg total) by mouth daily.   clonazePAM 0.5 MG tablet Commonly known as:  KLONOPIN Take 1 tablet (0.5 mg total) by mouth 3 (three) times daily. For anxiety   ergocalciferol 50000 units capsule Commonly known as:  VITAMIN D2 Take 50,000 Units by mouth every Wednesday.   eszopiclone 2 MG Tabs tablet Commonly known as:  LUNESTA Take 2  mg by mouth at bedtime.   FLUoxetine 20 MG capsule Commonly known as:  PROZAC Take 60 mg by mouth every morning.   folic acid 1 MG tablet Commonly known as:  FOLVITE Take 1 mg by mouth daily.   lamoTRIgine 200 MG tablet Commonly known as:  LAMICTAL Take 1 tablet (200 mg total) by mouth 2 (two) times daily. What changed:  Another medication with the same name was removed. Continue taking this medication, and follow the directions you see here.   Melatonin 5 MG Tabs Take 5 mg by mouth at bedtime.   meloxicam 15 MG tablet Commonly known as:  MOBIC Take 15 mg by mouth daily. For 14 days   methotrexate (PF) 50 MG/2ML injection Inject 25 mg into the skin every Sunday.   OTEZLA 30 MG Tabs Generic drug:  Apremilast Take 30 mg by mouth 2 (two) times daily.   potassium chloride 10 MEQ tablet Commonly known as:  K-DUR Take 1 tablet (10 mEq total) by mouth daily.   QUEtiapine 300 MG 24 hr  tablet Commonly known as:  SEROQUEL XR Take 300 mg by mouth at bedtime.      Follow-up Information    Kathrynn Ducking, MD Follow up.   Specialty:  Neurology Why:  Call and make an appoinment Monday to be seen within 7-10 days. Inform office that this is a post hospital follow up. Contact information: [address] [place] [address] [phone]        Melvenia Needles, NP Follow up.   Specialty:  Pulmonary Disease Why:  If needed you can call and make an apoointment. [phone] Contact information: [address] [place] [address] [phone]            Discharged Condition: good  Time spent on discharge 56 minutes.  Vital signs at Discharge. Temp:  [97.9 F (36.6 C)-98.7 F (37.1 C)] 98.5 F (36.9 C) (05/05 0800) Pulse Rate:  [75-127] 90 (05/05 0800) Resp:  [13-26] 25 (05/05 0800) BP: (88-134)/(55-91) 114/74 (05/05 0800) SpO2:  [93 %-100 %] 96 % (05/05 0800) Weight:  [236 lb 15.9 oz (107.5 kg)] 236 lb 15.9 oz (107.5 kg) (05/05 0500) Office follow up Special Information or instructions.  He does not need to follow up with PCCM but has office number if emergency arises.    Signed: Richardson Landry Minor ACNP [facility] PCCM Pager [phone] till 3 pm If no answer page [phone] 08/05/2016, 9:40 AM   Physician Discharge Summary    Discharged Condition: stable  Physician Statement:   The Patient was personally examined, the discharge assessment and plan has been personally reviewed and I agree with Richardson Landry Minor  assessment and plan. 42 minutes of time have been dedicated to discharge assessment, planning and discharge instructions. Pls see separate note done today.   Signed:  J. [place] 08/05/2016, 12:35 PM [place] [facility] Pager [phone] After 3 pm or if no answer, call [phone]

## 2016-08-08 ENCOUNTER — Telehealth: Payer: Self-pay | Admitting: Neurology

## 2016-08-08 LAB — CULTURE, BLOOD (ROUTINE X 2)
Culture: NO GROWTH
Culture: NO GROWTH
SPECIAL REQUESTS: ADEQUATE
Special Requests: ADEQUATE

## 2016-08-08 NOTE — Telephone Encounter (Signed)
Pt called to schedule hospital f/u for seizure post 7-10 days. Please call to schedule with Dr Jannifer Franklin.

## 2016-08-08 NOTE — Telephone Encounter (Signed)
Called and spoke with patient. Scheduled appt for 08/10/16 at 12pm. Check in 1130am.  Patient wanted Dr Jannifer Franklin to know PCP should be calling him to discuss everything that has been going on. Advised I will give him the message. He verbalized understanding.

## 2016-08-08 NOTE — Telephone Encounter (Signed)
Katie/[facility] calling for Dr Leighton Ruff, she is requesting to speak with Dr Viona Gilmore regarding the pt. She can be reached [phone].

## 2016-08-08 NOTE — Telephone Encounter (Signed)
I talk with Dr. Drema Dallas. The patient was recently in the hospital last week with a seizure this required intubation.  The patient had no changes to his seizure medications upon discharge. I will call and get him started on low-dose Keppra, the patient needs to see Dr. Veneta Penton for management of the Behcet's disease.  The patient has a follow-up visit through this office on May 10.

## 2016-08-10 ENCOUNTER — Ambulatory Visit (INDEPENDENT_AMBULATORY_CARE_PROVIDER_SITE_OTHER): Payer: Medicare Other | Admitting: Neurology

## 2016-08-10 ENCOUNTER — Encounter: Payer: Self-pay | Admitting: Neurology

## 2016-08-10 VITALS — BP 127/88 | HR 96 | Ht 75.0 in | Wt 237.5 lb

## 2016-08-10 DIAGNOSIS — Z5181 Encounter for therapeutic drug level monitoring: Secondary | ICD-10-CM | POA: Diagnosis not present

## 2016-08-10 DIAGNOSIS — G40909 Epilepsy, unspecified, not intractable, without status epilepticus: Secondary | ICD-10-CM

## 2016-08-10 MED ORDER — LAMOTRIGINE 25 MG PO TABS: 50.0000 mg | ORAL_TABLET | Freq: Two times a day (BID) | ORAL | 4 refills | Status: DC

## 2016-08-10 MED ORDER — LEVETIRACETAM 500 MG PO TABS: 500.0000 mg | ORAL_TABLET | Freq: Two times a day (BID) | ORAL | 4 refills | Status: DC

## 2016-08-10 NOTE — Patient Instructions (Signed)
   We will go up on the Lamictal to 250 mg twice a day, take one 200 mg twice a day and 2 of the 25 mg tablets twice a day.  We will start Keppra taking one 500 mg tablet twice a day.

## 2016-08-10 NOTE — Progress Notes (Signed)
Reason for visit: Seizures  [name] [name] is an 36 y.o. male  History of present illness:  [name] [name] is a 36 year old right-handed white male with a history of Behcet's disease and seizures. The patient has had a recent hospitalization on 08/03/2016 for a prolonged seizure event. The patient claims that he was with his mother at the time, he started feeling "sick", he knew that he was going to have a seizure. His mother immediately took him to the emergency room, and the patient had a seizure after he arrived at the hospital. The patient became unresponsive, he required intubation and admission to the hospital. The medications were not adjusted, the Lamictal level was 3.6. Over time, even with dose increases, the Lamictal level has dropped gradually. The patient claims that he has not missed any doses of medications. He does report that when he takes his medications he will have significant abdominal pain on the right upper quadrant and have radiation of pain into the right shoulder. He will be seeing a surgeon in the near future regarding this. He is followed by Dr. Veneta Penton for his Behcet's disease, he missed his appointment with Dr. Veneta Penton because he was in the hospital. Another revisit appointment was scheduled later this month. The patient comes back to this office for an evaluation. Prior lumbar puncture was done, studies were unremarkable with exception that the spinal fluid protein was elevated. There are concerns that the Behcet's disease is active and is producing a gradually progressive encephalopathy. The patient has reported a recent ulcer on his lower lip.   Past Medical History:  Diagnosis Date  . Behcet's disease ([employer])   . Bipolar disorder ([place])   . Hypertension   . Migraine   . Seizures ([place])    last experienced a seizure x 3 years ago  . Vitamin D deficiency     Past Surgical History:  Procedure Laterality Date  . APPENDECTOMY    . FRACTURE SURGERY Right    Arm  . maxilofacial     . NISSEN FUNDOPLICATION      Family History  Problem Relation Age of Onset  . Lung cancer Paternal Grandfather   . Other Father        BPPV    Social history:  reports that he has never smoked. He has never used smokeless tobacco. He reports that he does not drink alcohol or use drugs.    Allergies  Allergen Reactions  . Actos [Pioglitazone] Rash    Medications:  Prior to Admission medications   Medication Sig Start Date End Date Taking? Authorizing Provider  Apremilast (OTEZLA) 30 MG TABS Take 30 mg by mouth 2 (two) times daily.   Yes [provider]  Cholecalciferol (VITAMIN D PO) Take 500 Units by mouth daily.   Yes [provider]  clonazePAM (KLONOPIN) 0.5 MG tablet Take 1 tablet (0.5 mg total) by mouth 3 (three) times daily. For anxiety 02/16/13  Yes Robbie Lis, MD  eszopiclone (LUNESTA) 2 MG TABS tablet Take 1 mg by mouth at bedtime.  07/18/16  Yes [provider]  FLUoxetine HCl (PROZAC PO) Take 60 mg by mouth daily.   Yes [provider]  folic acid (FOLVITE) 1 MG tablet Take 1 tablet (1 mg total) by mouth daily. 08/05/16  Yes Minor, Grace Bushy, NP  lamoTRIgine (LAMICTAL) 200 MG tablet Take 1 tablet (200 mg total) by mouth 2 (two) times daily. 04/27/16  Yes Ward Givens, NP  lamoTRIgine (LAMICTAL)  25 MG tablet Take 25 mg by mouth 2 (two) times daily. 07/06/16  Yes [provider]  Melatonin 5 MG TABS Take 5 mg by mouth at bedtime.   Yes [provider]  Methotrexate Sodium (METHOTREXATE, PF,) 50 MG/2ML injection Inject 25 mg into the skin every Sunday.   Yes [provider]  QUEtiapine (SEROQUEL XR) 300 MG 24 hr tablet Take 300 mg by mouth at bedtime.   Yes [provider]    ROS:  Out of a complete 14 system review of symptoms, the patient complains only of the following symptoms, and all other reviewed systems are negative.  Swollen abdomen  Joint pain, joint swelling  Skin wounds  Seizures    Depression, anxiety    Blood pressure 127/88, pulse 96, height 6\' 3"  (1.905 m), weight 237 lb 8 oz (107.7 kg).  Physical Exam  General: The patient is alert and cooperative at the time of the examination.The patient is moderately obese.  Skin: No significant peripheral edema is noted.   Neurologic Exam  Mental status: The patient is alert and oriented x 3 at the time of the examination. The patient has apparent normal recent and remote memory, with an apparently normal attention span and concentration ability.   Cranial nerves: Facial symmetry is present. Speech is normal, no aphasia or dysarthria is noted. Extraocular movements are full. Visual fields are full.  Motor: The patient has good strength in all 4 extremities.  Sensory examination: Soft touch sensation is symmetric on the face, arms, and legs.  Coordination: The patient has good finger-nose-finger and heel-to-shin bilaterally.  Gait and station: The patient has a normal gait. Tandem gait is normal. Romberg is negative. No drift is seen.  Reflexes: Deep tendon reflexes are symmetric.   Assessment/Plan:  1. Behcet's disease   2. Seizure disorder with recent recurrence   The patient recently required admission to the hospital because with the seizures, he required intubation. His Lamictal dose seems to be dropping even though the dosing has increased. The dose will be increased once again to a 250 mg twice daily dose, Keppra will be added. The patient will follow-up for his next scheduled appointment in July 2018. The patient will be following up with Dr. Veneta Penton for his Behcet's disease.    Jill Alexanders MD 08/10/2016 12:44 PM  [facility] [address] [place], [place] [address]  Phone [phone] Fax [phone]

## 2016-08-11 LAB — LAMOTRIGINE LEVEL: Lamotrigine Lvl: 3.2 ug/mL (ref 2.0–20.0)

## 2016-08-21 ENCOUNTER — Telehealth: Payer: Self-pay | Admitting: Neurology

## 2016-08-21 NOTE — Telephone Encounter (Signed)
I got a call from Dr. Veneta Penton concerning this patient.  I indicated that I was concerned that the patient may be having issues with increased activity associated Behcet's disease, he has reported a decline in cognitive functioning over the last 6 months, he has had increased seizure frequency, but his Lamictal level has dropped on a stable dose. CSF has resulted in no abnormally with exception that the protein level was 84.  Dr. Veneta Penton may switch the patient to oral cyclophosphamide and stop the methotrexate. The patient will be increased gradually on Lamictal, we will go up on the [place] if needed.

## 2016-10-16 ENCOUNTER — Other Ambulatory Visit: Payer: Self-pay | Admitting: Adult Health

## 2016-10-25 ENCOUNTER — Encounter: Payer: Self-pay | Admitting: Neurology

## 2016-10-25 ENCOUNTER — Ambulatory Visit (INDEPENDENT_AMBULATORY_CARE_PROVIDER_SITE_OTHER): Payer: Medicare Other | Admitting: Neurology

## 2016-10-25 VITALS — BP 121/84 | HR 96 | Wt 233.5 lb

## 2016-10-25 DIAGNOSIS — Z5181 Encounter for therapeutic drug level monitoring: Secondary | ICD-10-CM

## 2016-10-25 DIAGNOSIS — M352 Behcet's disease: Secondary | ICD-10-CM

## 2016-10-25 DIAGNOSIS — H81391 Other peripheral vertigo, right ear: Secondary | ICD-10-CM

## 2016-10-25 MED ORDER — LEVETIRACETAM 100 MG/ML PO SOLN: 500.0000 mg | Freq: Two times a day (BID) | ORAL | 3 refills | Status: DC

## 2016-10-25 MED ORDER — LAMOTRIGINE 50 MG PO TBDP: 50.0000 mg | ORAL_TABLET | Freq: Two times a day (BID) | ORAL | 3 refills | Status: DC

## 2016-10-25 MED ORDER — LAMOTRIGINE 200 MG PO TBDP: 200.0000 mg | ORAL_TABLET | Freq: Two times a day (BID) | ORAL | 3 refills | Status: DC

## 2016-10-25 NOTE — Patient Instructions (Signed)
   We will go up on the lamictal to 250 mg twice a day, we will switch to the orally disintegrating tablets, and switch to the liquid form of Keppra, continue 500 mg twice a day.

## 2016-10-25 NOTE — Progress Notes (Signed)
Reason for visit: Behcet's disease  [name] [name] is an 36 y.o. male  History of present illness:  [name] is a 36 year old right-handed white male with a history of Behcet's disease. The patient is felt to have some activity of his disease with a chronic progressive encephalopathy. The patient is followed by Dr. Veneta Penton, the patient was taken off of methotrexate and switched to cyclophosphamide. The patient believes that his cognitive processing has improved gradually, he still has episodic skin and mouth ulcers. The patient has not had any further seizures, he is on Lamictal, he was to go to 250 mg twice daily but only went to 225 mg twice daily. The patient is on Keppra taking 500 mg twice daily, he is tolerating the drug well. He is having problems with stomach upset and pain with taking pills, he wishes to go to a liquid form of these medications. The patient returns for an evaluation. Overall, he feels that he is doing better with the cyclophosphamide treatment. He has lost 4 pounds since last seen in May 2018.  Past Medical History:  Diagnosis Date  . Behcet's disease ([place])   . Bipolar disorder ([place])   . Hypertension   . Migraine   . Seizures ([place])    last experienced a seizure x 3 years ago  . Vitamin D deficiency     Past Surgical History:  Procedure Laterality Date  . APPENDECTOMY    . FRACTURE SURGERY Right    Arm  . maxilofacial    . NISSEN FUNDOPLICATION      Family History  Problem Relation Age of Onset  . Lung cancer Paternal Grandfather   . Other Father        BPPV    Social history:  reports that he has never smoked. He has never used smokeless tobacco. He reports that he does not drink alcohol or use drugs.    Allergies  Allergen Reactions  . Actos [Pioglitazone] Rash    Medications:  Prior to Admission medications   Medication Sig Start Date End Date Taking? Authorizing Provider  Apremilast (OTEZLA) 30 MG TABS Take 30 mg by mouth 2 (two) times daily.    Yes [provider]  Cholecalciferol (VITAMIN D PO) Take 500 Units by mouth daily.   Yes [provider]  clonazePAM (KLONOPIN) 0.5 MG tablet Take 1 tablet (0.5 mg total) by mouth 3 (three) times daily. For anxiety 02/16/13  Yes Robbie Lis, MD  cyclophosphamide (CYTOXAN) 50 MG capsule TK 2 AND 1/2 CS PO QAM 10/10/16  Yes [provider]  FLUoxetine HCl (PROZAC PO) Take 60 mg by mouth daily.   Yes [provider]  lamoTRIgine (LAMICTAL) 200 MG tablet Take 1 tablet (200 mg total) by mouth 2 (two) times daily. 04/27/16  Yes Ward Givens, NP  lamoTRIgine (LAMICTAL) 25 MG tablet Take 2 tablets (50 mg total) by mouth 2 (two) times daily. 08/10/16  Yes Kathrynn Ducking, MD  levETIRAcetam (KEPPRA) 500 MG tablet Take 1 tablet (500 mg total) by mouth 2 (two) times daily. 08/10/16  Yes Kathrynn Ducking, MD  Methotrexate Sodium (METHOTREXATE, PF,) 50 MG/2ML injection Inject 25 mg into the skin every Sunday.   Yes [provider]  QUEtiapine (SEROQUEL XR) 300 MG 24 hr tablet Take 300 mg by mouth at bedtime.   Yes [provider]    ROS:  Out of a complete 14 system review of symptoms, the patient complains only of the following  symptoms, and all other reviewed systems are negative.  Swollen abdomen, abdominal pain Seizures Depression Skin wounds, rash  Blood pressure 121/84, pulse 96, weight 233 lb 8 oz (105.9 kg).  Physical Exam  General: The patient is alert and cooperative at the time of the examination.  Skin: No significant peripheral edema is noted.   Neurologic Exam  Mental status: The patient is alert and oriented x 3 at the time of the examination. The patient has apparent normal recent and remote memory, with an apparently normal attention span and concentration ability.   Cranial nerves: Facial symmetry is present. Speech is normal, no aphasia or dysarthria is noted. Extraocular movements are full. Visual fields are  full.  Motor: The patient has good strength in all 4 extremities.  Sensory examination: Soft touch sensation is symmetric on the face, arms, and legs.  Coordination: The patient has good finger-nose-finger and heel-to-shin bilaterally.  Gait and station: The patient has a normal gait. Tandem gait is normal. Romberg is negative. No drift is seen.  Reflexes: Deep tendon reflexes are symmetric.   Assessment/Plan:  1. Behcet's disease  2. Seizures  The patient will switch to a liquid form of Keppra taking 500 mg twice daily, we will place him on the orally disintegrating tablets of Lamictal taking 200 mg twice daily and a 50 mg tablet twice daily. The patient will have blood work done to check the Lamictal and Keppra levels today. He will follow-up in 5 months, sooner if needed. He is followed through gastroenterology for his stomach issues.  Jill Alexanders MD 10/25/2016 7:39 AM  [facility] [address] [place], [place] [address]  Phone [phone] Fax [phone]

## 2016-10-26 LAB — LEVETIRACETAM LEVEL: LEVETIRACETAM: 5.5 ug/mL — AB (ref 10.0–40.0)

## 2016-10-26 LAB — LAMOTRIGINE LEVEL: Lamotrigine Lvl: 3.2 ug/mL (ref 2.0–20.0)

## 2016-12-20 ENCOUNTER — Emergency Department (HOSPITAL_COMMUNITY)
Admission: EM | Admit: 2016-12-20 | Discharge: 2016-12-21 | Disposition: A | Discharge status: Home / Self Care | Payer: Medicare Other | Attending: Emergency Medicine | Admitting: Emergency Medicine

## 2016-12-20 ENCOUNTER — Other Ambulatory Visit: Payer: Self-pay

## 2016-12-20 ENCOUNTER — Encounter (HOSPITAL_COMMUNITY): Payer: Self-pay

## 2016-12-20 DIAGNOSIS — Z79899 Other long term (current) drug therapy: Secondary | ICD-10-CM | POA: Diagnosis not present

## 2016-12-20 DIAGNOSIS — I10 Essential (primary) hypertension: Secondary | ICD-10-CM | POA: Diagnosis not present

## 2016-12-20 DIAGNOSIS — G8384 Todd's paralysis (postepileptic): Secondary | ICD-10-CM | POA: Diagnosis not present

## 2016-12-20 DIAGNOSIS — R569 Unspecified convulsions: Secondary | ICD-10-CM | POA: Insufficient documentation

## 2016-12-20 LAB — CBC
HCT: 40.9 % (ref 39.0–52.0)
HEMOGLOBIN: 13.4 g/dL (ref 13.0–17.0)
MCH: 30.2 pg (ref 26.0–34.0)
MCHC: 32.8 g/dL (ref 30.0–36.0)
MCV: 92.3 fL (ref 78.0–100.0)
Platelets: 204 10*3/uL (ref 150–400)
RBC: 4.43 MIL/uL (ref 4.22–5.81)
RDW: 12.9 % (ref 11.5–15.5)
WBC: 8.5 10*3/uL (ref 4.0–10.5)

## 2016-12-20 LAB — BASIC METABOLIC PANEL
Anion gap: 8 (ref 5–15)
BUN: 12 mg/dL (ref 6–20)
CALCIUM: 9.4 mg/dL (ref 8.9–10.3)
CHLORIDE: 103 mmol/L (ref 101–111)
CO2: 25 mmol/L (ref 22–32)
CREATININE: 1.33 mg/dL — AB (ref 0.61–1.24)
GFR calc non Af Amer: >60 mL/min (ref >60)
Glucose, Bld: 93 mg/dL (ref 65–99)
Potassium: 3.8 mmol/L (ref 3.5–5.1)
Sodium: 136 mmol/L (ref 135–145)

## 2016-12-20 LAB — RAPID URINE DRUG SCREEN, HOSP PERFORMED
Amphetamines: NOT DETECTED
Barbiturates: NOT DETECTED
Benzodiazepines: NOT DETECTED
COCAINE: NOT DETECTED
Opiates: NOT DETECTED
Tetrahydrocannabinol: NOT DETECTED

## 2016-12-20 NOTE — ED Triage Notes (Signed)
PT from home [facility] from home. Pt had a witnessed seizure that lasted two minutes with no injury. Pt was aroused by pain and ammonia inhaler. Pt  C/o right arm and right leg weakness and facial tingling.

## 2016-12-20 NOTE — ED Provider Notes (Signed)
Patient reportedly had generalized seizure at 8:47 PM tonight. He fell back striking his head. He denies noncompliance with his medications. He reports last seizure was 3 months agoSince the event he feels a pins and needle sensation in his right arm and right leg. On exam patient is alert Glasgow Coma Score 15 HEENT exam normocephalic atraumatic. Eyes pupils are dilated bilaterally to possibly 5 mm extraocular muscles intact. Neurologic Glasgow Coma Score 15 grip strength and right hand is 4/5 motor strength is otherwise 5 over 5 overall. Cranial nerves II through XII grossly intact. DTRs symmetric bilaterally at knee jerk ankle jerk and biceps toes downgoing bilaterally   Orlie Dakin, MD 12/20/16 2333

## 2016-12-20 NOTE — ED Provider Notes (Signed)
[facility] DEPT Provider Note   CSN: [id number] Arrival date & time: 12/20/16  2228     History   Chief Complaint Chief Complaint  Patient presents with  . Seizures    HPI [name] [name] is a 36 y.o. male.  HPI  36 y.o. male with a hx of Behcet's Disease, HTN, Seizures, presents to the Emergency Department today due to seizure. This occurred around 2047. It was witnessed by family. Pt was washing dishes and then fell backwards. Noted tonic-clonic activity with shaking of arms and legs for <2 minutes per mother. Pt was lethargic with minimal response for ~20 minutes before EMS arrived and gave ammonia with response. Response is slow per EMS. Postictal on arrival. Noted hx of seizures. Seen by Dr. Jannifer Franklin from [facility]. The patient has had a recent hospitalization on 08/03/2016 for a prolonged seizure event that required intubation due to GCS. Pt notes right arm tingling as well as right leg tingling since event. No headaches. No visual changes. No CP/SOB/ABD pain. No fevers. No headaches or neck stiffness. Pt recently seen by Neurology on 10-25-16. Pt is on Keppra 500mg  BID and Lamictal 250mg  BID. No other symptoms noted.   Neurologist- Dr. Jannifer Franklin [facility])   Past Medical History:  Diagnosis Date  . Behcet's disease ([place])   . Bipolar disorder ([place])   . Hypertension   . Migraine   . Seizures ([place])    last experienced a seizure x 3 years ago  . Vitamin D deficiency     Patient Active Problem List   Diagnosis Date Noted  . Lactic acidosis   . Acute respiratory failure with hypoxia ([place])   . Seizures ([place])   . Post-ictal coma ([place]) 08/03/2016  . Acute respiratory failure with hypoxemia ([place])   . Seizure ([place])   . Altered mental status 07/07/2016  . Peripheral vertigo 07/17/2015  . Vertigo 07/17/2015  . Intractable vomiting 07/17/2015  . Diplopia   . Cellulitis and abscess of trunk-right chest wall 05/08/2013  . Dehydration 02/15/2013  . Behcet's  disease ([place]) 02/15/2013  . Recurrent vomiting 02/15/2013  . Encounter for therapeutic drug monitoring 06/14/2012  . History of oral aphthous ulcers 10/19/2010  . BIPOLAR DISORDER UNSPECIFIED 06/18/2008  . Essential hypertension 06/18/2008  . SYNCOPE AND COLLAPSE 06/18/2008  . Epilepsy ([place]) 06/18/2008    Past Surgical History:  Procedure Laterality Date  . APPENDECTOMY    . FRACTURE SURGERY Right    Arm  . maxilofacial    . NISSEN FUNDOPLICATION         Home Medications    Prior to Admission medications   Medication Sig Start Date End Date Taking? Authorizing Provider  Apremilast (OTEZLA) 30 MG TABS Take 30 mg by mouth 2 (two) times daily.    [provider]  Cholecalciferol (VITAMIN D PO) Take 500 Units by mouth daily.    [provider]  clonazePAM (KLONOPIN) 0.5 MG tablet Take 1 tablet (0.5 mg total) by mouth 3 (three) times daily. For anxiety 02/16/13   Robbie Lis, MD  cyclophosphamide (CYTOXAN) 50 MG capsule TK 2 AND 1/2 CS PO QAM 10/10/16   [provider]  FLUoxetine HCl (PROZAC PO) Take 60 mg by mouth daily.    [provider]  LamoTRIgine (LAMICTAL ODT) 200 MG TBDP Take 1 tablet (200 mg total) by mouth 2 (two) times daily. 10/25/16   Kathrynn Ducking, MD  LamoTRIgine (LAMICTAL ODT) 50 MG TBDP Take 1 tablet (50 mg total)  by mouth 2 (two) times daily. 10/25/16   Kathrynn Ducking, MD  levETIRAcetam (KEPPRA) 100 MG/ML solution Take 5 mLs (500 mg total) by mouth 2 (two) times daily. 10/25/16   Kathrynn Ducking, MD  Methotrexate Sodium (METHOTREXATE, PF,) 50 MG/2ML injection Inject 25 mg into the skin every Sunday.    [provider]  QUEtiapine (SEROQUEL XR) 300 MG 24 hr tablet Take 300 mg by mouth at bedtime.    [provider]    Family History Family History  Problem Relation Age of Onset  . Lung cancer Paternal Grandfather   . Other Father        BPPV    Social History Social History  Substance Use  Topics  . Smoking status: Never Smoker  . Smokeless tobacco: Never Used  . Alcohol use No     Allergies   Actos [pioglitazone]   Review of Systems Review of Systems ROS reviewed and all are negative for acute change except as noted in the HPI.  Physical Exam Updated Vital Signs BP 126/90 (BP Location: Right Arm)   Pulse 98   Temp (!) 97.2 F (36.2 C) (Oral)   Resp 16   SpO2 100%   Physical Exam  Constitutional: He is oriented to person, place, and time. He appears well-developed and well-nourished. No distress.  Pt slowed response, but answers questions appropriately   HENT:  Head: Normocephalic and atraumatic.  Right Ear: Tympanic membrane, external ear and ear canal normal.  Left Ear: Tympanic membrane, external ear and ear canal normal.  Nose: Nose normal.  Mouth/Throat: Uvula is midline, oropharynx is clear and moist and mucous membranes are normal. No trismus in the jaw. No oropharyngeal exudate, posterior oropharyngeal erythema or tonsillar abscesses.  Eyes: Pupils are equal, round, and reactive to light. EOM are normal.  Pupils equal and response to light  Neck: Normal range of motion. Neck supple. No tracheal deviation present.  Cardiovascular: Normal rate, regular rhythm, S1 normal, S2 normal, normal heart sounds, intact distal pulses and normal pulses.   Pulmonary/Chest: Effort normal and breath sounds normal. No respiratory distress. He has no decreased breath sounds. He has no wheezes. He has no rhonchi. He has no rales.  Abdominal: Normal appearance and bowel sounds are normal. There is no tenderness.  Musculoskeletal: Normal range of motion.  Neurological: He is alert and oriented to person, place, and time. He has normal strength. No cranial nerve deficit or sensory deficit.  Cranial Nerves:  II: Pupils equal, round, reactive to light III,IV, VI: ptosis not present, extra-ocular motions intact bilaterally  V,VII: smile symmetric, facial light touch  sensation equal VIII: hearing grossly normal bilaterally  IX,X: midline uvula rise  XI: bilateral shoulder shrug equal and strong XII: midline tongue extension  Skin: Skin is warm and dry.  Psychiatric: He has a normal mood and affect. His speech is normal and behavior is normal. Thought content normal.  Nursing note and vitals reviewed.    ED Treatments / Results  Labs (all labs ordered are listed, but only abnormal results are displayed) Labs Reviewed  BASIC METABOLIC PANEL - Abnormal; Notable for the following:       Result Value   Creatinine, Ser 1.33 (*)    All other components within normal limits  CBC  RAPID URINE DRUG SCREEN, HOSP PERFORMED    EKG  EKG Interpretation None       Radiology No results found.  Procedures Procedures (including critical care time)  Medications Ordered  in ED Medications - No data to display   Initial Impression / Assessment and Plan / ED Course  I have reviewed the triage vital signs and the nursing notes.  Pertinent labs & imaging results that were available during my care of the patient were reviewed by me and considered in my medical decision making (see chart for details).  Final Clinical Impressions(s) / ED Diagnoses  {I have reviewed and evaluated the relevant laboratory values.   {I have reviewed the relevant previous healthcare records.  {I obtained HPI from historian. {Patient discussed with supervising physician.  ED Course:  Assessment: Pt is a 36 y.o. male with a hx of Behcet's Disease, HTN, Seizures, presents to the Emergency Department today due to seizure. This occurred around 2047. It was witnessed by family. Pt was washing dishes and then fell backwards. Noted tonic-clonic activity with shaking of arms and legs for <2 minutes per mother. Pt was lethargic with minimal response for ~20 minutes before EMS arrived and gave ammonia with response. Response is slow per EMS. Postictal on arrival. Noted hx of seizures. Seen  by Dr. Jannifer Franklin from [facility]. The patient has had a recent hospitalization on 08/03/2016 for a prolonged seizure event that required intubation due to GCS. Pt notes right arm tingling as well as right leg tingling since event. No headaches. No visual changes. No CP/SOB/ABD pain. No fevers. No headaches or neck stiffness. Pt recently seen by Neurology on 10-25-16. Pt is on Keppra 500mg  BID and Lamictal 250mg  BID. On exam, pt in NAD. Nontoxic/nonseptic appearing. Slowed response. VSS. Afebrile. Lungs CTA. Heart RRR. Abdomen nontender soft. CN evaluated and unremarkable. Seen by supervising physician. 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 CBC unremarkable. BMP unremarkable. UDS unremarkable. On reexamination around 0022. Pt awake and alert and more responsive. Unilateral symptoms of right arm resolved without intervention. Symptoms consistent with Todd's Paralysis. Plan is to DC home with close follow up to Neurology. At time of discharge, Patient is in no acute distress. Vital Signs are stable. Patient is able to ambulate. Patient able to tolerate PO.   Disposition/Plan:  DC Home Additional Verbal discharge instructions given and discussed with patient.  Pt Instructed to f/u with Neurology in the next week for evaluation and treatment of symptoms. 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

## 2016-12-21 ENCOUNTER — Telehealth: Payer: Self-pay | Admitting: *Deleted

## 2016-12-21 DIAGNOSIS — R569 Unspecified convulsions: Secondary | ICD-10-CM | POA: Diagnosis not present

## 2016-12-21 NOTE — ED Notes (Signed)
PT states understanding of care given, follow up care. PT ambulated from ED to car with a steady gait.  

## 2016-12-21 NOTE — Telephone Encounter (Signed)
Called pt. Advised Dr Jannifer Franklin aware of his seizure event yesterday and would like him to f/u within the next several weeks.  Scheduled appt with Dr Jannifer Franklin on 01/03/17 at 55am. He verbalized understanding.  MM,NP who patient has seen before had no available appt.

## 2016-12-21 NOTE — Discharge Instructions (Signed)
Please read and follow all provided instructions.  Your diagnoses today include:  1. 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 See below for your results today.   Medications prescribed:  Take as prescribed   Home care instructions:  Follow any educational materials contained in this packet.  Follow-up instructions: Please follow-up with your Neurologist for further evaluation of symptoms and treatment   Return instructions:  Please return to the Emergency Department if you do not get better, if you get worse, or new symptoms OR  - Fever (temperature greater than 101.32F)  - Bleeding that does not stop with holding pressure to the area    -Severe pain (please note that you may be more sore the day after your accident)  - Chest Pain  - Difficulty breathing  - Severe nausea or vomiting  - Inability to tolerate food and liquids  - Passing out  - Skin becoming red around your wounds  - Change in mental status (confusion or lethargy)  - New numbness or weakness    Please return if you have any other emergent concerns.  Additional Information:  Your vital signs today were: BP 126/90 (BP Location: Right Arm)    Pulse 98    Temp (!) 97.2 F (36.2 C) (Oral)    Resp 16    SpO2 100%  If your blood pressure (BP) was elevated above 135/85 this visit, please have this repeated by your doctor within one month. ---------------

## 2017-01-03 ENCOUNTER — Ambulatory Visit (INDEPENDENT_AMBULATORY_CARE_PROVIDER_SITE_OTHER): Payer: Medicare Other | Admitting: Neurology

## 2017-01-03 ENCOUNTER — Encounter: Payer: Self-pay | Admitting: Neurology

## 2017-01-03 VITALS — BP 132/90 | HR 96 | Ht 72.0 in | Wt 238.0 lb

## 2017-01-03 DIAGNOSIS — M352 Behcet's disease: Secondary | ICD-10-CM

## 2017-01-03 DIAGNOSIS — G40909 Epilepsy, unspecified, not intractable, without status epilepticus: Secondary | ICD-10-CM

## 2017-01-03 DIAGNOSIS — M545 Low back pain, unspecified: Secondary | ICD-10-CM

## 2017-01-03 MED ORDER — LAMOTRIGINE 100 MG PO TBDP: 300.0000 mg | ORAL_TABLET | Freq: Two times a day (BID) | ORAL | 3 refills | Status: DC

## 2017-01-03 MED ORDER — LEVETIRACETAM 100 MG/ML PO SOLN: 700.0000 mg | Freq: Two times a day (BID) | ORAL | 3 refills | Status: DC

## 2017-01-03 NOTE — Progress Notes (Signed)
Reason for visit: Seizures  [name] [name] is an 36 y.o. male  History of present illness:  [name] [name] is a 36 year old right handed white male with a history of seizures associated with Behcet's disease. He has had a recurring seizure on 12/20/16 associated with a Todd's paralysis. The patient has a right sided weakness and an aphasia, a scan of the brain was not done. The patient returns to office today, he claims that he just does not feel well, he still feels as if the right side the body is slow and clumsy, he has not fully recovered following the seizure. He has had low back pain that was present 3 or 4 days prior to the seizure event, unassociated with any injury. The back pain has persisted without radiation down into the legs on his side. The patient is on cyclophosphamide taking 200 mg daily. He is followed by Dr. Veneta Penton for his Behcet's disease. The patient did not miss any doses of his medications prior to the seizure. His Keppra level was low and his Lamictal level was in the low therapeutic range. He returns to the office today for further evaluation.  Past Medical History:  Diagnosis Date  . Behcet's disease ([place])   . Bipolar disorder ([place])   . Hypertension   . Migraine   . Seizures ([place])    last experienced a seizure x 3 years ago  . Vitamin D deficiency     Past Surgical History:  Procedure Laterality Date  . APPENDECTOMY    . FRACTURE SURGERY Right    Arm  . maxilofacial    . NISSEN FUNDOPLICATION      Family History  Problem Relation Age of Onset  . Lung cancer Paternal Grandfather   . Other Father        BPPV    Social history:  reports that he has never smoked. He has never used smokeless tobacco. He reports that he does not drink alcohol or use drugs.   No Known Allergies  Medications:  Prior to Admission medications   Medication Sig Start Date End Date Taking? Authorizing Provider  Apremilast (OTEZLA) 30 MG TABS Take 30 mg by mouth 2 (two) times daily.  11/29/16  Yes [provider]  clonazePAM (KLONOPIN) 0.5 MG tablet Take 1 tablet (0.5 mg total) by mouth 3 (three) times daily. For anxiety 02/16/13  Yes Robbie Lis, MD  cyclophosphamide (CYTOXAN) 50 MG capsule Take 150 mg by mouth daily. 12/06/16  Yes [provider]  FLUoxetine (PROZAC) 20 MG/5ML solution Take 60 mg by mouth daily. 11/27/16  Yes [provider]  LamoTRIgine (LAMICTAL ODT) 200 MG TBDP Take 1 tablet (200 mg total) by mouth 2 (two) times daily. 10/25/16  Yes Kathrynn Ducking, MD  LamoTRIgine (LAMICTAL ODT) 50 MG TBDP Take 1 tablet (50 mg total) by mouth 2 (two) times daily. 10/25/16  Yes Kathrynn Ducking, MD  levETIRAcetam (KEPPRA) 100 MG/ML solution Take 5 mLs (500 mg total) by mouth 2 (two) times daily. 10/25/16  Yes Kathrynn Ducking, MD  Vitamin D, Ergocalciferol, (DRISDOL) 50000 units CAPS capsule Take 50,000 Units by mouth every 7 (seven) days. On Sunday   Yes [provider]    ROS:  Out of a complete 14 system review of symptoms, the patient complains only of the following symptoms, and all other reviewed systems are negative.  Swollen abdomen Joint pain, joint swelling, back pain Skin wounds, rash Seizure, weakness Depression, anxiety  Blood pressure 132/90, pulse 96, height 6' (1.829 m), weight 238 lb (108 kg).  Physical Exam  General: The patient is alert and cooperative at the time of the examination. The patient is moderately obese.  Skin: No significant peripheral edema is noted.   Neurologic Exam  Mental status: The patient is alert and oriented x 3 at the time of the examination. The patient has apparent normal recent and remote memory, with an apparently normal attention span and concentration ability.   Cranial nerves: Facial symmetry is present. Speech is normal, no aphasia or dysarthria is noted. Extraocular movements are full. Visual fields are full.  Motor: The patient has good strength in all 4 extremities,  but the patient does not seem to move the right arm and leg as briskly as the left..  Sensory examination: Soft touch sensation is symmetric on the face, arms, and legs.  Coordination: The patient has good finger-nose-finger and heel-to-shin bilaterally.  Gait and station: The patient has a normal gait. Tandem gait is normal. Romberg is negative. No drift is seen.  Reflexes: Deep tendon reflexes are symmetric.    Assessment/Plan:  1. Behcet's disease  2. Seizures with recent recurrence  3. Low back pain, new onset  4. Clumsiness, right arm and leg  The patient has had a Todd's paralysis on the right following a seizure which is new for him, he does not believe that he is fully recovered from this. For this reason, MRI of the brain with and without gadolinium enhancement will be done. The patient will be increased on the Keppra taking 700 mg twice daily, he will increase the Lamictal to 300 mg twice daily. The patient will be sent for x-ray of the low back. He will follow-up for his next scheduled visits in January 2019. He does not operate a motor vehicle.  Jill Alexanders MD 01/03/2017 7:49 AM  [facility] [address] [place], [place] [address]  Phone [phone] Fax [phone]

## 2017-01-03 NOTE — Patient Instructions (Signed)
   We will check MRI of the head and increase the Keppra to 7 cc twice a day and change the Lamictal to 300 mg twice a day.

## 2017-01-06 ENCOUNTER — Emergency Department (HOSPITAL_COMMUNITY): Payer: Medicare Other

## 2017-01-06 ENCOUNTER — Emergency Department (HOSPITAL_COMMUNITY)
Admission: EM | Admit: 2017-01-06 | Discharge: 2017-01-06 | Disposition: A | Discharge status: Home / Self Care | Payer: Medicare Other | Attending: Emergency Medicine | Admitting: Emergency Medicine

## 2017-01-06 ENCOUNTER — Encounter (HOSPITAL_COMMUNITY): Payer: Self-pay | Admitting: Emergency Medicine

## 2017-01-06 DIAGNOSIS — Z79899 Other long term (current) drug therapy: Secondary | ICD-10-CM | POA: Diagnosis not present

## 2017-01-06 DIAGNOSIS — R310 Gross hematuria: Secondary | ICD-10-CM | POA: Diagnosis not present

## 2017-01-06 DIAGNOSIS — R319 Hematuria, unspecified: Secondary | ICD-10-CM | POA: Diagnosis present

## 2017-01-06 LAB — COMPREHENSIVE METABOLIC PANEL
ALT: 42 U/L (ref 17–63)
ANION GAP: 12 (ref 5–15)
AST: 25 U/L (ref 15–41)
Albumin: 4.4 g/dL (ref 3.5–5.0)
Alkaline Phosphatase: 100 U/L (ref 38–126)
BILIRUBIN TOTAL: 0.3 mg/dL (ref 0.3–1.2)
BUN: 11 mg/dL (ref 6–20)
CO2: 24 mmol/L (ref 22–32)
Calcium: 9.5 mg/dL (ref 8.9–10.3)
Chloride: 103 mmol/L (ref 101–111)
Creatinine, Ser: 1.01 mg/dL (ref 0.61–1.24)
GFR calc Af Amer: >60 mL/min (ref >60)
GLUCOSE: 95 mg/dL (ref 65–99)
POTASSIUM: 3.7 mmol/L (ref 3.5–5.1)
Sodium: 139 mmol/L (ref 135–145)
Total Protein: 8.2 g/dL — ABNORMAL HIGH (ref 6.5–8.1)

## 2017-01-06 LAB — URINALYSIS, ROUTINE W REFLEX MICROSCOPIC
BACTERIA UA: NONE SEEN
BILIRUBIN URINE: NEGATIVE
Glucose, UA: NEGATIVE mg/dL
Ketones, ur: NEGATIVE mg/dL
Nitrite: NEGATIVE
PROTEIN: NEGATIVE mg/dL
Specific Gravity, Urine: 1.008 (ref 1.005–1.030)
Squamous Epithelial / LPF: NONE SEEN
pH: 5 (ref 5.0–8.0)

## 2017-01-06 LAB — CBC
HEMATOCRIT: 44.4 % (ref 39.0–52.0)
Hemoglobin: 15.1 g/dL (ref 13.0–17.0)
MCH: 31.5 pg (ref 26.0–34.0)
MCHC: 34 g/dL (ref 30.0–36.0)
MCV: 92.7 fL (ref 78.0–100.0)
Platelets: 196 10*3/uL (ref 150–400)
RBC: 4.79 MIL/uL (ref 4.22–5.81)
RDW: 13 % (ref 11.5–15.5)
WBC: 9.4 10*3/uL (ref 4.0–10.5)

## 2017-01-06 LAB — LIPASE, BLOOD: LIPASE: 25 U/L (ref 11–51)

## 2017-01-06 NOTE — ED Triage Notes (Signed)
Pt reports he has had hematuria since this am accompanied by dysuria. Pt also complains of abd bloating.

## 2017-01-06 NOTE — ED Notes (Signed)
Below order not completed by EW. 

## 2017-01-06 NOTE — Discharge Instructions (Signed)
Please read instructions below. Drink plenty of water. Schedule an appointment with the urologist to follow up on your hematuria. Return to the ER if you develop a fever, have nausea, back pain, or new or concerning symptoms.

## 2017-01-06 NOTE — ED Provider Notes (Signed)
[facility] DEPT Provider Note   CSN: [id number] Arrival date & time: 01/06/17  1350     History   Chief Complaint Chief Complaint  Patient presents with  . Hematuria  . Abdominal Pain    HPI [name] [name] is a 36 y.o. male presenting with acute onset of hematuria began this morning. Patient states he is having pain with urination, and bright red blood in his urine. He states he is also having bleeding when he is not urinating. Reports associated suprapubic abdominal discomfort/bloating. Denies nausea/vomiting, flank pain, fever/chills, testicular pain or swelling, rash, or other complaints today. Denies history of nephrolithiasis. States he has not inserted any foreign objects into his urethra.  The history is provided by the patient.    Past Medical History:  Diagnosis Date  . Behcet's disease ([place])   . Bipolar disorder ([place])   . Hypertension   . Migraine   . Seizures ([place])    last experienced a seizure x 3 years ago  . Vitamin D deficiency     Patient Active Problem List   Diagnosis Date Noted  . Lactic acidosis   . Acute respiratory failure with hypoxia ([place])   . Seizures ([place])   . Post-ictal coma ([place]) 08/03/2016  . Acute respiratory failure with hypoxemia ([place])   . Seizure ([place])   . Altered mental status 07/07/2016  . Peripheral vertigo 07/17/2015  . Vertigo 07/17/2015  . Intractable vomiting 07/17/2015  . Diplopia   . Cellulitis and abscess of trunk-right chest wall 05/08/2013  . Dehydration 02/15/2013  . Behcet's disease ([place]) 02/15/2013  . Recurrent vomiting 02/15/2013  . Encounter for therapeutic drug monitoring 06/14/2012  . History of oral aphthous ulcers 10/19/2010  . BIPOLAR DISORDER UNSPECIFIED 06/18/2008  . Essential hypertension 06/18/2008  . SYNCOPE AND COLLAPSE 06/18/2008  . Epilepsy ([place]) 06/18/2008    Past Surgical History:  Procedure Laterality Date  . APPENDECTOMY    . FRACTURE SURGERY Right    Arm  . maxilofacial    . NISSEN  FUNDOPLICATION         Home Medications    Prior to Admission medications   Medication Sig Start Date End Date Taking? Authorizing Provider  Apremilast (OTEZLA) 30 MG TABS Take 30 mg by mouth 2 (two) times daily. 11/29/16  Yes [provider]  clonazePAM (KLONOPIN) 0.5 MG tablet Take 1 tablet (0.5 mg total) by mouth 3 (three) times daily. For anxiety 02/16/13  Yes Robbie Lis, MD  cyclophosphamide (CYTOXAN) 50 MG capsule Take 150 mg by mouth daily. 12/06/16  Yes [provider]  FLUoxetine (PROZAC) 20 MG/5ML solution Take 60 mg by mouth daily. 11/27/16  Yes [provider]  LamoTRIgine (LAMICTAL ODT) 100 MG TBDP Take 3 tablets (300 mg total) by mouth 2 (two) times daily. 01/03/17  Yes Kathrynn Ducking, MD  levETIRAcetam (KEPPRA) 100 MG/ML solution Take 7 mLs (700 mg total) by mouth 2 (two) times daily. 01/03/17  Yes Kathrynn Ducking, MD  Melatonin 3 MG CAPS Take 3 mg by mouth at bedtime.   Yes [provider]  QUEtiapine (SEROQUEL) 300 MG tablet Take 300 mg by mouth at bedtime.   Yes [provider]    Family History Family History  Problem Relation Age of Onset  . Lung cancer Paternal Grandfather   . Other Father        BPPV    Social History Social History  Substance Use Topics  . Smoking status: Never Smoker  .  Smokeless tobacco: Never Used  . Alcohol use No     Allergies   Patient has no known allergies.   Review of Systems Review of Systems  Constitutional: Negative for chills and fever.  Gastrointestinal: Positive for abdominal pain. Negative for nausea and vomiting.  Genitourinary: Positive for dysuria and hematuria. Negative for decreased urine volume, discharge, flank pain, frequency, penile swelling, scrotal swelling and testicular pain.  Skin: Negative for color change and rash.  All other systems reviewed and are negative.    Physical Exam Updated Vital Signs BP (!) 130/97 (BP Location: Right Arm)   Pulse 98    Temp 97.7 F (36.5 C) (Oral)   Resp 12   SpO2 100%   Physical Exam  Constitutional: He appears well-developed and well-nourished. No distress.  HENT:  Head: Normocephalic and atraumatic.  Eyes: Conjunctivae are normal.  Cardiovascular: Normal rate, regular rhythm, normal heart sounds and intact distal pulses.   Pulmonary/Chest: Effort normal and breath sounds normal.  Abdominal: Soft. Bowel sounds are normal. He exhibits no distension and no mass. There is tenderness (Mild tenderness suprapubic). There is no guarding and no CVA tenderness. A hernia (Ventral hernia that is reducible and not significantly tender.) is present.  Genitourinary: Circumcised.  Genitourinary Comments: Exam performed with male chaperone present. No rashes or lesions. Urethral meatus with blood. Penile shaft is tender, however not erythematous or edematous. Testicles and scrotum without tenderness, edema, or erythema.  Neurological: He is alert.  Skin: Skin is warm.  Psychiatric: He has a normal mood and affect. His behavior is normal.  Nursing note and vitals reviewed.    ED Treatments / Results  Labs (all labs ordered are listed, but only abnormal results are displayed) Labs Reviewed  COMPREHENSIVE METABOLIC PANEL - Abnormal; Notable for the following:       Result Value   Total Protein 8.2 (*)    All other components within normal limits  URINALYSIS, ROUTINE W REFLEX MICROSCOPIC - Abnormal; Notable for the following:    Hgb urine dipstick LARGE (*)    Leukocytes, UA TRACE (*)    All other components within normal limits  LIPASE, BLOOD  CBC    EKG  EKG Interpretation None       Radiology Ct Renal Stone Study  Result Date: 01/06/2017 CLINICAL DATA:  Pt reports he has had hematuria since this am accompanied by dysuria. Pt also complains of abdominal bloating. EXAM: CT ABDOMEN AND PELVIS WITHOUT CONTRAST TECHNIQUE: Multidetector CT imaging of the abdomen and pelvis was performed following the  standard protocol without IV contrast. COMPARISON:  02/15/2013 FINDINGS: Lower chest: No acute abnormality. Hepatobiliary: No focal liver abnormality is seen. No gallstones, gallbladder wall thickening, or biliary dilatation. Pancreas: Unremarkable. No pancreatic ductal dilatation or surrounding inflammatory changes. Spleen: Normal in size without focal abnormality. Adrenals/Urinary Tract: Adrenal glands are unremarkable. Kidneys are normal, without renal calculi, focal lesion, or hydronephrosis. Bladder is unremarkable. Stomach/Bowel: Stomach is within normal limits. No evidence of bowel wall thickening, distention, or inflammatory changes. Vascular/Lymphatic: No significant vascular findings are present. No enlarged abdominal or pelvic lymph nodes. Reproductive: Prostate is unremarkable. Other: No abdominal wall hernia or abnormality. No abdominopelvic ascites. Musculoskeletal: No acute or significant osseous findings. IMPRESSION: 1. No acute abdominal or pelvic pathology. 2. No urolithiasis or obstructive uropathy. Electronically Signed   By: Kathreen Devoid   On: 01/06/2017 15:36    Procedures Procedures (including critical care time)  Medications Ordered in ED Medications - No data to display  Initial Impression / Assessment and Plan / ED Course  I have reviewed the triage vital signs and the nursing notes.  Pertinent labs & imaging results that were available during my care of the patient were reviewed by me and considered in my medical decision making (see chart for details).     Patient presenting with gross hematuria that began this morning. On exam, blood at the urethral meatus with some penile tenderness, however remainder of genital exam benign. CT stone study negative. Normal renal function. CBC, CMP, lipase unremarkable. UA with large hemoglobin, and no evidence of UTI. Patient is well-appearing, not in distress, afebrile, hemodynamically stable. Patient is safe for discharge at this  time, with urology referral for follow-up.  Patient discussed with and seen by Dr. Tamera Punt, who guided treatment and agrees with care plan.  Discussed results, findings, treatment and follow up. Patient advised of return precautions. Patient verbalized understanding and agreed with plan.   Final Clinical Impressions(s) / ED Diagnoses   Final diagnoses:  Gross hematuria    New Prescriptions New Prescriptions   No medications on file     Russo, [place] N, PA-C 01/06/17 1607    Malvin Johns, MD 01/07/17 [phone]

## 2017-01-18 ENCOUNTER — Ambulatory Visit
Admission: RE | Admit: 2017-01-18 | Discharge: 2017-01-18 | Disposition: A | Discharge status: Home / Self Care | Payer: Medicare Other | Source: Ambulatory Visit | Attending: Neurology | Admitting: Neurology

## 2017-01-18 DIAGNOSIS — M352 Behcet's disease: Secondary | ICD-10-CM

## 2017-01-18 DIAGNOSIS — G40909 Epilepsy, unspecified, not intractable, without status epilepticus: Secondary | ICD-10-CM

## 2017-01-18 MED ORDER — GADOBENATE DIMEGLUMINE 529 MG/ML IV SOLN
20.0000 mL | Freq: Once | INTRAVENOUS | Status: AC | PRN
  Administered 2017-01-18: 20 mL via INTRAVENOUS

## 2017-01-19 ENCOUNTER — Telehealth: Payer: Self-pay | Admitting: Neurology

## 2017-01-19 NOTE — Telephone Encounter (Signed)
  I called the patient. The MRI the brain is unremarkable, the patient likely had a Todd's paralysis following the seizure, no focal lesion to explain the right-sided symptoms that he had.  MRI brain 01/19/17:  IMPRESSION:  This MRI of the brain with and without contrast shows the following: 1.   The brain parenchyma appears normal before and after contrast.  2.   Chronic left maxillary sinusitis. 3.   There are no acute findings and there is a normal enhancement pattern.

## 2017-02-16 ENCOUNTER — Emergency Department (HOSPITAL_COMMUNITY)
Admission: EM | Admit: 2017-02-16 | Discharge: 2017-02-16 | Disposition: A | Discharge status: Home / Self Care | Payer: Medicare Other | Attending: Emergency Medicine | Admitting: Emergency Medicine

## 2017-02-16 ENCOUNTER — Encounter (HOSPITAL_COMMUNITY): Payer: Self-pay | Admitting: Nurse Practitioner

## 2017-02-16 DIAGNOSIS — Z79899 Other long term (current) drug therapy: Secondary | ICD-10-CM | POA: Diagnosis not present

## 2017-02-16 DIAGNOSIS — R404 Transient alteration of awareness: Secondary | ICD-10-CM | POA: Diagnosis not present

## 2017-02-16 DIAGNOSIS — I10 Essential (primary) hypertension: Secondary | ICD-10-CM | POA: Insufficient documentation

## 2017-02-16 DIAGNOSIS — R4182 Altered mental status, unspecified: Secondary | ICD-10-CM | POA: Diagnosis present

## 2017-02-16 LAB — URINALYSIS, ROUTINE W REFLEX MICROSCOPIC
BILIRUBIN URINE: NEGATIVE
GLUCOSE, UA: NEGATIVE mg/dL
Hgb urine dipstick: NEGATIVE
Ketones, ur: NEGATIVE mg/dL
LEUKOCYTES UA: NEGATIVE
Nitrite: NEGATIVE
PROTEIN: NEGATIVE mg/dL
Specific Gravity, Urine: 1.015 (ref 1.005–1.030)
pH: 5 (ref 5.0–8.0)

## 2017-02-16 LAB — CBC WITH DIFFERENTIAL/PLATELET
Basophils Absolute: 0 K/uL (ref 0.0–0.1)
Basophils Relative: 0 %
Eosinophils Absolute: 0.2 K/uL (ref 0.0–0.7)
Eosinophils Relative: 2 %
HCT: 46.7 % (ref 39.0–52.0)
Hemoglobin: 15.9 g/dL (ref 13.0–17.0)
Lymphocytes Relative: 19 %
Lymphs Abs: 1.3 K/uL (ref 0.7–4.0)
MCH: 32 pg (ref 26.0–34.0)
MCHC: 34 g/dL (ref 30.0–36.0)
MCV: 94 fL (ref 78.0–100.0)
Monocytes Absolute: 0.7 K/uL (ref 0.1–1.0)
Monocytes Relative: 11 %
Neutro Abs: 4.6 K/uL (ref 1.7–7.7)
Neutrophils Relative %: 68 %
Platelets: 191 K/uL (ref 150–400)
RBC: 4.97 MIL/uL (ref 4.22–5.81)
RDW: 12.8 % (ref 11.5–15.5)
WBC: 6.9 K/uL (ref 4.0–10.5)

## 2017-02-16 LAB — COMPREHENSIVE METABOLIC PANEL
ALT: 30 U/L (ref 17–63)
ANION GAP: 8 (ref 5–15)
AST: 23 U/L (ref 15–41)
Albumin: 4.6 g/dL (ref 3.5–5.0)
Alkaline Phosphatase: 111 U/L (ref 38–126)
BILIRUBIN TOTAL: 0.6 mg/dL (ref 0.3–1.2)
BUN: 12 mg/dL (ref 6–20)
CALCIUM: 9.5 mg/dL (ref 8.9–10.3)
CO2: 26 mmol/L (ref 22–32)
Chloride: 104 mmol/L (ref 101–111)
Creatinine, Ser: 1.19 mg/dL (ref 0.61–1.24)
GFR calc Af Amer: >60 mL/min (ref >60)
GFR calc non Af Amer: >60 mL/min (ref >60)
Glucose, Bld: 88 mg/dL (ref 65–99)
Potassium: 3.5 mmol/L (ref 3.5–5.1)
Sodium: 138 mmol/L (ref 135–145)
Total Protein: 8.4 g/dL — ABNORMAL HIGH (ref 6.5–8.1)

## 2017-02-16 MED ORDER — SODIUM CHLORIDE 0.9 % IV BOLUS (SEPSIS)
1000.0000 mL | Freq: Once | INTRAVENOUS | Status: AC
  Administered 2017-02-16: 1000 mL via INTRAVENOUS

## 2017-02-16 MED ORDER — PROPOFOL 10 MG/ML IV BOLUS: 200.0000 mg | Freq: Once | INTRAVENOUS | Status: DC

## 2017-02-16 NOTE — ED Provider Notes (Signed)
Patient seen and evaluated. Discussed with Josh Deibel PA-C. Patient presented earlier feeling of though he was going to have a seizure. Symptoms cleared now. In discussion with he and his mother he had a staring episode while at the restaurant and in the car afterwards. Esophageal rate had a petit mal seizure.  He is cleared. He feels normal. Discussed with him that there is no indication for adjusting his medications. He is plugged in good standing with a neurologist. Vascular contact his neurologist any additional symptoms or recurrence. Recheck here with any new or worsening symptoms.   Tanna Furry, MD 02/16/17 [phone]

## 2017-02-16 NOTE — Discharge Instructions (Signed)
Please read and follow all provided instructions.  Your diagnoses today include:  1. Transient alteration of awareness     Tests performed today include:  Blood counts and electrolytes   Urine test  Vital signs. See below for your results today.   Medications prescribed:   None  Take any prescribed medications only as directed.  Home care instructions:  Follow any educational materials contained in this packet.  Follow-up instructions: Please follow-up with your primary care provider in the next 3 days for further evaluation of your symptoms.   Return instructions:   Please return to the Emergency Department if you experience worsening symptoms.   Return if you have any recurrent seizure symptoms, confusion, fever, headache or other concerns.  Please return if you have any other emergent concerns.  Additional Information:  Your vital signs today were: BP 118/84 Comment: Simultaneous filing. User may not have seen previous data.   Pulse 76 Comment: Simultaneous filing. User may not have seen previous data.   Temp 97.7 F (36.5 C) (Oral)    Resp (!) 25 Comment: Simultaneous filing. User may not have seen previous data.   SpO2 99% Comment: Simultaneous filing. User may not have seen previous data. If your blood pressure (BP) was elevated above 135/85 this visit, please have this repeated by your doctor within one month. --------------

## 2017-02-16 NOTE — ED Notes (Signed)
Pt. Stated that he is feeling better and wanted to be discharged now . Pt.'s mother at bedside and assured that pt. Is feeling good after the IV fluid and wound like patient to be discharged as well.

## 2017-02-16 NOTE — ED Triage Notes (Signed)
Pt states he does not feel well, family at bedside report that he is not acting himself, they add that "he is looking like he is about to have seizure." Pupils are unequal and dilated, pt is able to provide a hx but is repetitive. Hx of sezures, is rx Lamictal and keppra.

## 2017-02-16 NOTE — ED Provider Notes (Signed)
[facility] DEPT Provider Note   CSN: [id number] Arrival date & time: 02/16/17  1649     History   Chief Complaint Chief Complaint  Patient presents with  . Altered Mental Status    HPI [name] [name] is a 36 y.o. male.  Patient with history of Behcet's disease on cyclophosphamide, history of seizure disorder due to this on Keppra and Lamictal --presents with complaint of "just not feeling well".  He states that he feels very foggy.  Patient states that his eyes feel like they are dilated and he is just "staring into space".  He has not had a seizure today.  He states he has been compliant with all of his medications.  Last took his seizure medication at 2 PM today.  Family states that symptoms started while at [facility].  He did not appear to be in any respiratory distress.  Sometimes he will get like this prior to having a seizure.  Patient states that he has been sleeping well.  Denies other stressors or triggers for seizures.  Otherwise no medical complaints. The onset of this condition was acute. The course is constant. Aggravating factors: none. Alleviating factors: none.        Past Medical History:  Diagnosis Date  . Behcet's disease ([place])   . Bipolar disorder ([place])   . Hypertension   . Migraine   . Seizures ([place])    last experienced a seizure x 3 years ago  . Vitamin D deficiency     Patient Active Problem List   Diagnosis Date Noted  . Lactic acidosis   . Acute respiratory failure with hypoxia ([place])   . Seizures ([place])   . Post-ictal coma ([place]) 08/03/2016  . Acute respiratory failure with hypoxemia ([place])   . Seizure ([place])   . Altered mental status 07/07/2016  . Peripheral vertigo 07/17/2015  . Vertigo 07/17/2015  . Intractable vomiting 07/17/2015  . Diplopia   . Cellulitis and abscess of trunk-right chest wall 05/08/2013  . Dehydration 02/15/2013  . Behcet's disease ([place]) 02/15/2013  . Recurrent vomiting 02/15/2013  .  Encounter for therapeutic drug monitoring 06/14/2012  . History of oral aphthous ulcers 10/19/2010  . BIPOLAR DISORDER UNSPECIFIED 06/18/2008  . Essential hypertension 06/18/2008  . SYNCOPE AND COLLAPSE 06/18/2008  . Epilepsy ([place]) 06/18/2008    Past Surgical History:  Procedure Laterality Date  . APPENDECTOMY    . FRACTURE SURGERY Right    Arm  . maxilofacial    . NISSEN FUNDOPLICATION         Home Medications    Prior to Admission medications   Medication Sig Start Date End Date Taking? Authorizing Provider  Apremilast (OTEZLA) 30 MG TABS Take 30 mg by mouth 2 (two) times daily. 11/29/16   [provider]  clonazePAM (KLONOPIN) 0.5 MG tablet Take 1 tablet (0.5 mg total) by mouth 3 (three) times daily. For anxiety 02/16/13   Robbie Lis, MD  cyclophosphamide (CYTOXAN) 50 MG capsule Take 150 mg by mouth daily. 12/06/16   [provider]  FLUoxetine (PROZAC) 20 MG/5ML solution Take 60 mg by mouth daily. 11/27/16   [provider]  LamoTRIgine (LAMICTAL ODT) 100 MG TBDP Take 3 tablets (300 mg total) by mouth 2 (two) times daily. 01/03/17   Kathrynn Ducking, MD  levETIRAcetam (KEPPRA) 100 MG/ML solution Take 7 mLs (700 mg total) by mouth 2 (two) times daily. 01/03/17   Kathrynn Ducking, MD  Melatonin 3 MG CAPS  Take 3 mg by mouth at bedtime.    [provider]  QUEtiapine (SEROQUEL) 300 MG tablet Take 300 mg by mouth at bedtime.    [provider]    Family History Family History  Problem Relation Age of Onset  . Lung cancer Paternal Grandfather   . Other Father        BPPV    Social History Social History   Tobacco Use  . Smoking status: Never Smoker  . Smokeless tobacco: Never Used  Substance Use Topics  . Alcohol use: No  . Drug use: No     Allergies   Patient has no known allergies.   Review of Systems Review of Systems  Constitutional: Negative for fever.  HENT: Negative for rhinorrhea and sore throat.   Eyes:  Negative for redness.  Respiratory: Positive for shortness of breath. Negative for cough.   Cardiovascular: Negative for chest pain.  Gastrointestinal: Negative for abdominal pain, diarrhea, nausea and vomiting.  Genitourinary: Negative for dysuria.  Musculoskeletal: Negative for myalgias.  Skin: Negative for rash.  Neurological: Positive for light-headedness. Negative for headaches.     Physical Exam Updated Vital Signs BP 131/81 (BP Location: Left Arm)   Pulse (!) 101   Temp 97.7 F (36.5 C) (Oral)   Resp 18   SpO2 100%   Physical Exam  Constitutional: He is oriented to person, place, and time. He appears well-developed and well-nourished.  HENT:  Head: Normocephalic and atraumatic.  Right Ear: Tympanic membrane, external ear and ear canal normal.  Left Ear: Tympanic membrane, external ear and ear canal normal.  Nose: Nose normal.  Mouth/Throat: Uvula is midline, oropharynx is clear and moist and mucous membranes are normal.  Eyes: Conjunctivae, EOM and lids are normal. Pupils are equal, round, and reactive to light.  Pupils are 5-13mm, equal, reactive to light.   Neck: Normal range of motion. Neck supple.  Cardiovascular: Normal rate and regular rhythm.  No murmur heard. Pulmonary/Chest: Effort normal and breath sounds normal. No stridor. No respiratory distress. He has no wheezes.  Abdominal: Soft. There is no tenderness. There is no rebound and no guarding.  Musculoskeletal: Normal range of motion. He exhibits no edema.       Cervical back: He exhibits normal range of motion, no tenderness and no bony tenderness.  Neurological: He is alert and oriented to person, place, and time. He has normal strength and normal reflexes. No cranial nerve deficit or sensory deficit. He exhibits normal muscle tone. He displays a negative Romberg sign. Coordination and gait normal. GCS eye subscore is 4. GCS verbal subscore is 5. GCS motor subscore is 6.  Skin: Skin is warm and dry.    Psychiatric: He has a normal mood and affect.  Nursing note and vitals reviewed.    ED Treatments / Results  Labs (all labs ordered are listed, but only abnormal results are displayed) Labs Reviewed - No data to display  EKG  EKG Interpretation  Date/Time:  Friday February 16 2017 17:22:50 EST Ventricular Rate:  105 PR Interval:    QRS Duration: 96 QT Interval:  361 QTC Calculation: 478 R Axis:   56 Text Interpretation:  Sinus tachycardia Abnormal R-wave progression, early transition Borderline repolarization abnormality Borderline prolonged QT interval Confirmed by Tanna Furry [phone]) on 02/16/2017 5:31:56 PM       Radiology No results found.  Procedures Procedures (including critical care time)  Medications Ordered in ED Medications  sodium chloride 0.9 % bolus 1,000 mL (  0 mLs Intravenous Stopped 02/16/17 2107)     Initial Impression / Assessment and Plan / ED Course  I have reviewed the triage vital signs and the nursing notes.  Pertinent labs & imaging results that were available during my care of the patient were reviewed by me and considered in my medical decision making (see chart for details).     Patient seen and examined. Work-up initiated. Medications ordered. EKG reviewed. Borderline Qtc.   Vital signs reviewed and are as follows: BP 131/81 (BP Location: Left Arm)   Pulse (!) 101   Temp 97.7 F (36.5 C) (Oral)   Resp 18   SpO2 100%   10:16 PM patient discussed with and seen by Dr. Jeneen Rinks.  Patient is now feeling much improved.  He has returned to his baseline per himself and mother at bedside.  We discussed results.  Patient to call his neurologist and let them know what happens.  Suspect possible petit mal seizure with postictal state.  Patient will need outpatient neurology follow-up.  Encouraged to return with recurrent symptoms or additional seizures.  Final Clinical Impressions(s) / ED Diagnoses   Final diagnoses:  Transient alteration of  awareness   Patient with unusual episode of confusion and staring.  This may be related to patient's underlying seizure disorder however he has not had a similar seizure or postictal state in the past.  After observation in the emergency department, patient has returned to baseline.  Feel comfortable with him being discharged home at this time.  Strongly encouraged outpatient follow-up.  ED Discharge Orders    None       Carlisle Cater, Hershal Coria 02/16/17 2218    Tanna Furry, MD 02/28/17 [phone]

## 2017-02-28 ENCOUNTER — Telehealth: Payer: Self-pay | Admitting: Neurology

## 2017-02-28 NOTE — Telephone Encounter (Signed)
[name] [name], Psychiatric NP is calling to advise she increased dosage of Prozac to 80mg  a day. Any concerns please call but she would like to confirm that Dr. Jannifer Franklin did receive this change.

## 2017-02-28 NOTE — Telephone Encounter (Signed)
The patient has been increased on the Prozac 80 mg daily.  I do not think that this will be a problem with controlling her seizure disorder.

## 2017-04-13 ENCOUNTER — Emergency Department (HOSPITAL_COMMUNITY)
Admission: EM | Admit: 2017-04-13 | Discharge: 2017-04-14 | Disposition: A | Discharge status: Home / Self Care | Payer: Medicare Other | Attending: Emergency Medicine | Admitting: Emergency Medicine

## 2017-04-13 ENCOUNTER — Encounter (HOSPITAL_COMMUNITY): Payer: Self-pay | Admitting: Emergency Medicine

## 2017-04-13 ENCOUNTER — Emergency Department (HOSPITAL_COMMUNITY): Payer: Medicare Other

## 2017-04-13 DIAGNOSIS — Y939 Activity, unspecified: Secondary | ICD-10-CM | POA: Diagnosis not present

## 2017-04-13 DIAGNOSIS — K5900 Constipation, unspecified: Secondary | ICD-10-CM | POA: Diagnosis not present

## 2017-04-13 DIAGNOSIS — Z79899 Other long term (current) drug therapy: Secondary | ICD-10-CM | POA: Diagnosis not present

## 2017-04-13 DIAGNOSIS — I10 Essential (primary) hypertension: Secondary | ICD-10-CM | POA: Diagnosis not present

## 2017-04-13 DIAGNOSIS — Y929 Unspecified place or not applicable: Secondary | ICD-10-CM | POA: Insufficient documentation

## 2017-04-13 DIAGNOSIS — T183XXA Foreign body in small intestine, initial encounter: Secondary | ICD-10-CM | POA: Diagnosis not present

## 2017-04-13 DIAGNOSIS — T184XXA Foreign body in colon, initial encounter: Secondary | ICD-10-CM | POA: Diagnosis not present

## 2017-04-13 DIAGNOSIS — Y999 Unspecified external cause status: Secondary | ICD-10-CM | POA: Diagnosis not present

## 2017-04-13 DIAGNOSIS — X58XXXA Exposure to other specified factors, initial encounter: Secondary | ICD-10-CM | POA: Insufficient documentation

## 2017-04-13 LAB — URINALYSIS, ROUTINE W REFLEX MICROSCOPIC
Bacteria, UA: NONE SEEN
Bilirubin Urine: NEGATIVE
GLUCOSE, UA: NEGATIVE mg/dL
HGB URINE DIPSTICK: NEGATIVE
KETONES UR: 5 mg/dL — AB
NITRITE: NEGATIVE
PH: 5 (ref 5.0–8.0)
Protein, ur: 30 mg/dL — AB
Specific Gravity, Urine: 1.03 (ref 1.005–1.030)

## 2017-04-13 LAB — COMPREHENSIVE METABOLIC PANEL
ALT: 85 U/L — AB (ref 17–63)
AST: 47 U/L — ABNORMAL HIGH (ref 15–41)
Albumin: 4.3 g/dL (ref 3.5–5.0)
Alkaline Phosphatase: 103 U/L (ref 38–126)
Anion gap: 8 (ref 5–15)
BUN: 12 mg/dL (ref 6–20)
CALCIUM: 9.1 mg/dL (ref 8.9–10.3)
CO2: 25 mmol/L (ref 22–32)
CREATININE: 0.98 mg/dL (ref 0.61–1.24)
Chloride: 107 mmol/L (ref 101–111)
GFR calc Af Amer: >60 mL/min (ref >60)
GFR calc non Af Amer: >60 mL/min (ref >60)
Glucose, Bld: 90 mg/dL (ref 65–99)
Potassium: 3.9 mmol/L (ref 3.5–5.1)
Sodium: 140 mmol/L (ref 135–145)
Total Bilirubin: 0.9 mg/dL (ref 0.3–1.2)
Total Protein: 7.6 g/dL (ref 6.5–8.1)

## 2017-04-13 LAB — LIPASE, BLOOD: Lipase: 23 U/L (ref 11–51)

## 2017-04-13 LAB — CBC
HCT: 44.2 % (ref 39.0–52.0)
Hemoglobin: 14.7 g/dL (ref 13.0–17.0)
MCH: 32.7 pg (ref 26.0–34.0)
MCHC: 33.3 g/dL (ref 30.0–36.0)
MCV: 98.2 fL (ref 78.0–100.0)
PLATELETS: 169 10*3/uL (ref 150–400)
RBC: 4.5 MIL/uL (ref 4.22–5.81)
RDW: 14.7 % (ref 11.5–15.5)
WBC: 7.1 10*3/uL (ref 4.0–10.5)

## 2017-04-13 LAB — POC OCCULT BLOOD, ED: Fecal Occult Bld: NEGATIVE

## 2017-04-13 MED ORDER — IOPAMIDOL (ISOVUE-300) INJECTION 61%
100.0000 mL | Freq: Once | INTRAVENOUS | Status: AC | PRN
  Administered 2017-04-13: 100 mL via INTRAVENOUS

## 2017-04-13 MED ORDER — IOPAMIDOL (ISOVUE-300) INJECTION 61%
INTRAVENOUS | Status: AC
  Filled 2017-04-13: qty 100

## 2017-04-13 NOTE — ED Triage Notes (Signed)
Patient reports that was having dark blood in stools for month and was occult positive last week in PCP office. Patient now not able to have BM since MOnday and having abd pains. Patient denies any urinary problems or n/v.

## 2017-04-13 NOTE — ED Provider Notes (Signed)
[facility] DEPT Provider Note   CSN: [id number] Arrival date & time: 04/13/17  1725     History   Chief Complaint Chief Complaint  Patient presents with  . Constipation  . Abdominal Pain    HPI [name] [name] is a 37 y.o. male.  HPI   37 year old male with history of bipolar, Bercets, hypertension, seizure presenting to the ED complaining of abdominal discomfort and rectal bleeding.  Patient report for the past week he has noticed black tarry stool which is new.  No increase in bowel movement but did notice that his stool is dark.  He was seen at urgent care center for his condition.  He was given a referral to a GI specialist when he had a positive Hemoccult.  The appointment is in the next couple weeks.  However for the past 5 days he has not had any bowel movement despite having urge to go.  States he is able to pass flatus.  He usually have a bowel movement every other day.  Patient mentioned he feels bloated and "obstructed" no report of fever, chills, nausea, vomiting chest pain or shortness of breath.  No urinary symptoms.  No specific treatment tried.  Denies alcohol use or NSAIDs use.  Past Medical History:  Diagnosis Date  . Behcet's disease ([place])   . Bipolar disorder ([place])   . Hypertension   . Migraine   . Seizures ([place])    last experienced a seizure x 3 years ago  . Vitamin D deficiency     Patient Active Problem List   Diagnosis Date Noted  . Lactic acidosis   . Acute respiratory failure with hypoxia ([place])   . Seizures ([place])   . Post-ictal coma ([place]) 08/03/2016  . Acute respiratory failure with hypoxemia ([place])   . Seizure ([place])   . Altered mental status 07/07/2016  . Peripheral vertigo 07/17/2015  . Vertigo 07/17/2015  . Intractable vomiting 07/17/2015  . Diplopia   . Cellulitis and abscess of trunk-right chest wall 05/08/2013  . Dehydration 02/15/2013  . Behcet's disease ([place]) 02/15/2013  . Recurrent vomiting 02/15/2013  .  Encounter for therapeutic drug monitoring 06/14/2012  . History of oral aphthous ulcers 10/19/2010  . BIPOLAR DISORDER UNSPECIFIED 06/18/2008  . Essential hypertension 06/18/2008  . SYNCOPE AND COLLAPSE 06/18/2008  . Epilepsy ([place]) 06/18/2008    Past Surgical History:  Procedure Laterality Date  . APPENDECTOMY    . FRACTURE SURGERY Right    Arm  . maxilofacial    . NISSEN FUNDOPLICATION         Home Medications    Prior to Admission medications   Medication Sig Start Date End Date Taking? Authorizing Provider  Apremilast (OTEZLA) 30 MG TABS Take 30 mg by mouth 2 (two) times daily. 11/29/16  Yes [provider]  clonazePAM (KLONOPIN) 0.5 MG tablet Take 1 tablet (0.5 mg total) by mouth 3 (three) times daily. For anxiety 02/16/13  Yes Robbie Lis, MD  cyclophosphamide (CYTOXAN) 50 MG capsule Take 150 mg by mouth daily. 12/06/16  Yes [provider]  FLUoxetine (PROZAC) 20 MG/5ML solution Take 60 mg by mouth daily. 11/27/16  Yes [provider]  LamoTRIgine 200 MG TBDP DISSOLVE 1 T PO BID 02/02/17  Yes [provider]  levETIRAcetam (KEPPRA) 100 MG/ML solution Take 7 mLs (700 mg total) by mouth 2 (two) times daily. 01/03/17  Yes Kathrynn Ducking, MD  QUEtiapine Fumarate (SEROQUEL XR) 150 MG 24 hr tablet TK  1 T PO QD AT 8 PM 01/22/17  Yes [provider]  Melatonin 3 MG CAPS Take 3 mg by mouth at bedtime.    [provider]  mycophenolate (CELLCEPT) 500 MG tablet Take 500 mg by mouth daily. 03/20/17   [provider]  predniSONE (DELTASONE) 10 MG tablet  11/15/16   [provider]  QUEtiapine (SEROQUEL) 100 MG tablet Take 100 mg by mouth at bedtime. 04/01/17   [provider]    Family History Family History  Problem Relation Age of Onset  . Lung cancer Paternal Grandfather   . Other Father        BPPV    Social History Social History   Tobacco Use  . Smoking status: Never Smoker  . Smokeless  tobacco: Never Used  Substance Use Topics  . Alcohol use: No  . Drug use: No     Allergies   Patient has no known allergies.   Review of Systems Review of Systems  All other systems reviewed and are negative.    Physical Exam Updated Vital Signs BP (!) 154/95 (BP Location: Left Arm)   Pulse (!) 102   Temp 98.3 F (36.8 C) (Oral)   Resp 18   Ht 6\' 1"  (1.854 m)   Wt 103.1 kg (227 lb 6 oz)   SpO2 100%   BMI 30.00 kg/m   Physical Exam  Constitutional: He appears well-developed and well-nourished. No distress.  HENT:  Head: Atraumatic.  Eyes: Conjunctivae are normal.  Neck: Neck supple.  Cardiovascular: Normal rate and regular rhythm.  Pulmonary/Chest: Effort normal and breath sounds normal.  Abdominal: Bowel sounds are decreased. There is generalized tenderness.  Genitourinary:  Genitourinary Comments: Chaperone present during exam.  Normal rectal tone, no stool in rectal vault, no obvious mass, no stool impaction  Neurological: He is alert.  Skin: No rash noted.  Psychiatric: He has a normal mood and affect.  Nursing note and vitals reviewed.    ED Treatments / Results  Labs (all labs ordered are listed, but only abnormal results are displayed) Labs Reviewed  COMPREHENSIVE METABOLIC PANEL - Abnormal; Notable for the following components:      Result Value   AST 47 (*)    ALT 85 (*)    All other components within normal limits  URINALYSIS, ROUTINE W REFLEX MICROSCOPIC - Abnormal; Notable for the following components:   Ketones, ur 5 (*)    Protein, ur 30 (*)    Leukocytes, UA TRACE (*)    Squamous Epithelial / LPF 0-5 (*)    All other components within normal limits  LIPASE, BLOOD  CBC  POC OCCULT BLOOD, ED    EKG  EKG Interpretation None       Radiology Ct Abdomen Pelvis W Contrast  Result Date: 04/13/2017 CLINICAL DATA:  37 year old male with abdominal pain. EXAM: CT ABDOMEN AND PELVIS WITH CONTRAST TECHNIQUE: Multidetector CT imaging of the  abdomen and pelvis was performed using the standard protocol following bolus administration of intravenous contrast. CONTRAST:  161mL ISOVUE-300 IOPAMIDOL (ISOVUE-300) INJECTION 61% COMPARISON:  Abdominal CT dated 01/06/2017 FINDINGS: Lower chest: The visualized lung bases are clear. No intra-free air or free fluid. Hepatobiliary: Subcentimeter hypodense lesion in the dome of the liver is too small to characterize but likely represents a cyst or hemangioma. The liver is otherwise unremarkable. The gallbladder is unremarkable. Pancreas: Unremarkable. No pancreatic ductal dilatation or surrounding inflammatory changes. Spleen: Normal in size without focal abnormality. Adrenals/Urinary Tract: Adrenal glands  are unremarkable. Kidneys are normal, without renal calculi, focal lesion, or hydronephrosis. Bladder is unremarkable. Stomach/Bowel: There is a 12 mm high attenuating linear foreign object within the rectum. Two additional high attenuating linear object noted in the small bowel one in the left hemiabdomen and the other in the mid abdomen, likely ingested foreign objects. There is no bowel obstruction or active inflammation. Postsurgical changes of Nissen fundoplication. Appendectomy. Vascular/Lymphatic: No significant vascular findings are present. No enlarged abdominal or pelvic lymph nodes. Reproductive: The prostate and seminal vesicles are grossly unremarkable. Other: None Musculoskeletal: No acute or significant osseous findings. IMPRESSION: Three linear high attenuating structures in the bowel (one in the region of the rectum and the other two in the small bowel) likely ingested foreign objects. No bowel obstruction or active inflammation. No evidence of perforation. Electronically Signed   By: Anner Crete M.D.   On: 04/13/2017 23:34    Procedures Procedures (including critical care time)  Medications Ordered in ED Medications  iopamidol (ISOVUE-300) 61 % injection (not administered)  iopamidol  (ISOVUE-300) 61 % injection 100 mL (100 mLs Intravenous Contrast Given 04/13/17 2316)     Initial Impression / Assessment and Plan / ED Course  I have reviewed the triage vital signs and the nursing notes.  Pertinent labs & imaging results that were available during my care of the patient were reviewed by me and considered in my medical decision making (see chart for details).     BP (!) 154/95 (BP Location: Left Arm)   Pulse (!) 102   Temp 98.3 F (36.8 C) (Oral)   Resp 18   Ht 6\' 1"  (1.854 m)   Wt 103.1 kg (227 lb 6 oz)   SpO2 100%   BMI 30.00 kg/m    Final Clinical Impressions(s) / ED Diagnoses   Final diagnoses:  Constipation, unspecified constipation type  Foreign body in intestine and colon, initial encounter    ED Discharge Orders        Ordered    polyethylene glycol (MIRALAX / GLYCOLAX) packet  Daily     04/14/17 0015    bisacodyl (DULCOLAX) 5 MG EC tablet  Daily PRN     04/14/17 0015     10:58 PM Patient here complaining of having black tarry stools initially and now not having any bowel movement.  Prior history of Nissen fundoplication as well as history of Bercet's disease.  Workup initiated.  Will obtain abdominal pelvic CT scan for further evaluation.  Concern for potential small bowel obstruction.  11:50 PM Labs are reassuring.  Fecal occult blood test negative.  Abdominal pelvis CT scan demonstrate 3 linear high attenuating structures in the bowel, one in the region of the rectum and the other 2 in the small bowel, likely ingested foreign objects.  No evidence of small bowel obstruction or active inflammation.  No evidence of perforation.  I discussed finding with patient.  Patient states he does not recall ingesting any foreign object no trying to.  He does have history of Nissen fundoplication.  Denies any active pain and the object does not appears to be sharp or any evidence of perforation on CT scan.  I discussed this with Dr. Kathrynn Humble.  Plan to have pt  f/u outpt with his GI specialist for further management.  He may benefit from colonoscopy for further evaluation.  Will d/c with stool softener, to help with BMs.  Otherwise pt is stable.     Domenic Moras, PA-C 04/14/17 0017  Varney Biles, MD 04/14/17 [phone]

## 2017-04-14 MED ORDER — POLYETHYLENE GLYCOL 3350 17 G PO PACK: 17.0000 g | PACK | Freq: Every day | ORAL | 0 refills | Status: DC

## 2017-04-14 MED ORDER — BISACODYL 5 MG PO TBEC: 5.0000 mg | DELAYED_RELEASE_TABLET | Freq: Every day | ORAL | 0 refills | Status: DC | PRN

## 2017-04-14 NOTE — Discharge Instructions (Signed)
During our evaluation there are 3 foreign objects noted in your small intestine and your rectum.  You will need to follow up with your GI specialist for further management, which may include a colonoscopy. Take stool softener to help with constipation.  Return if you have any concerns.

## 2017-04-23 ENCOUNTER — Ambulatory Visit: Payer: Medicare Other | Admitting: Neurology

## 2017-04-23 ENCOUNTER — Telehealth: Payer: Self-pay | Admitting: Neurology

## 2017-04-23 ENCOUNTER — Encounter: Payer: Self-pay | Admitting: Neurology

## 2017-04-23 NOTE — Telephone Encounter (Signed)
This patient canceled the same day of the appointment.

## 2017-04-24 ENCOUNTER — Other Ambulatory Visit: Payer: Self-pay | Admitting: Family Medicine

## 2017-04-24 DIAGNOSIS — N62 Hypertrophy of breast: Secondary | ICD-10-CM

## 2017-04-24 DIAGNOSIS — N644 Mastodynia: Secondary | ICD-10-CM

## 2017-04-30 ENCOUNTER — Ambulatory Visit
Admission: RE | Admit: 2017-04-30 | Discharge: 2017-04-30 | Disposition: A | Discharge status: Home / Self Care | Payer: Medicare Other | Source: Ambulatory Visit | Attending: Family Medicine | Admitting: Family Medicine

## 2017-04-30 ENCOUNTER — Ambulatory Visit: Payer: Medicare Other

## 2017-04-30 DIAGNOSIS — N644 Mastodynia: Secondary | ICD-10-CM

## 2017-04-30 DIAGNOSIS — N62 Hypertrophy of breast: Secondary | ICD-10-CM

## 2017-05-17 ENCOUNTER — Encounter: Payer: Self-pay | Admitting: Neurology

## 2017-05-17 ENCOUNTER — Ambulatory Visit (INDEPENDENT_AMBULATORY_CARE_PROVIDER_SITE_OTHER): Payer: Medicare Other | Admitting: Neurology

## 2017-05-17 VITALS — BP 131/81 | HR 92 | Ht 73.0 in | Wt 235.0 lb

## 2017-05-17 DIAGNOSIS — M352 Behcet's disease: Secondary | ICD-10-CM | POA: Diagnosis not present

## 2017-05-17 DIAGNOSIS — G40909 Epilepsy, unspecified, not intractable, without status epilepticus: Secondary | ICD-10-CM

## 2017-05-17 DIAGNOSIS — Z5181 Encounter for therapeutic drug level monitoring: Secondary | ICD-10-CM

## 2017-05-17 NOTE — Progress Notes (Signed)
Reason for visit: Behcet's disease, seizures  [name] [name] is an 37 y.o. male  History of present illness:  [name] [name] is a 37 year old right-handed white male with a history of Behcet's disease.  The patient is being actively treated for the Behcet's, he is on Cytoxan and CellCept followed by Dr. Veneta Penton from rheumatology.  The patient has indicated that he has felt better on the Cytoxan, he still has some cognitive slowing and skin lesions, he reports some joint pain but he feels that he is functioning better in general.  He last had episodes of staring spells on 16 February 2017.  He had 2 such events.  He has not had any generalized seizures or episodes of Todd's paralysis on the left.  The patient is on Keppra and Lamictal, he is tolerating the medications well.  He does report occasional headaches.  He denies any numbness or weakness of the face, arms, or legs.  He denies issues controlling the bowels or the bladder.  He reports no issues with balance with walking.  He returns to this office for an evaluation.  Past Medical History:  Diagnosis Date  . Behcet's disease ([place])   . Bipolar disorder ([place])   . Hypertension   . Migraine   . Seizures ([place])    last experienced a seizure x 3 years ago  . Vitamin D deficiency     Past Surgical History:  Procedure Laterality Date  . APPENDECTOMY    . FRACTURE SURGERY Right    Arm  . maxilofacial    . NISSEN FUNDOPLICATION      Family History  Problem Relation Age of Onset  . Lung cancer Paternal Grandfather   . Other Father        BPPV    Social history:  reports that  has never smoked. he has never used smokeless tobacco. He reports that he does not drink alcohol or use drugs.   No Known Allergies  Medications:  Prior to Admission medications   Medication Sig Start Date End Date Taking? Authorizing Provider  clonazePAM (KLONOPIN) 0.5 MG tablet Take 1 tablet (0.5 mg total) by mouth 3 (three) times daily. For anxiety 02/16/13  Yes  Robbie Lis, MD  cyclophosphamide (CYTOXAN) 50 MG capsule Take 150 mg by mouth daily. 12/06/16  Yes [provider]  FLUoxetine (PROZAC) 20 MG/5ML solution Take 60 mg by mouth daily. 11/27/16  Yes [provider]  LamoTRIgine 200 MG TBDP take 200 mg by mouth twice daily 02/02/17  Yes [provider]  levETIRAcetam (KEPPRA) 100 MG/ML solution Take 7 mLs (700 mg total) by mouth 2 (two) times daily. 01/03/17  Yes Kathrynn Ducking, MD  mycophenolate (CELLCEPT) 500 MG tablet Take 500 mg by mouth 2 (two) times daily.  03/20/17  Yes [provider]  QUEtiapine (SEROQUEL) 100 MG tablet Take 100 mg by mouth at bedtime. 04/01/17  Yes [provider]    ROS:  Out of a complete 14 system review of symptoms, the patient complains only of the following symptoms, and all other reviewed systems are negative.  Excessive sweating Swollen abdomen, rectal bleeding, rectal pain Daytime sleepiness Joint pain, joint swelling Skin wounds Seizures Anxiety, depression  Blood pressure 131/81, pulse 92, height 6\' 1"  (1.854 m), weight 235 lb (106.6 kg).  Physical Exam  General: The patient is alert and cooperative at the time of the examination.  The patient is moderately obese.  Skin: No significant peripheral edema is noted.  Neurologic Exam  Mental status: The patient is alert and oriented x 3 at the time of the examination. The patient has apparent normal recent and remote memory, with an apparently normal attention span and concentration ability.  The patient appears to be slightly slow cognitively, he may repeat himself at times.   Cranial nerves: Facial symmetry is present. Speech is normal, no aphasia or dysarthria is noted. Extraocular movements are full. Visual fields are full.  Motor: The patient has good strength in all 4 extremities.  Sensory examination: Soft touch sensation is symmetric on the face, arms, and legs.  Coordination: The patient has  good finger-nose-finger and heel-to-shin bilaterally.  Gait and station: The patient has a normal gait. Tandem gait is normal. Romberg is negative. No drift is seen.  Reflexes: Deep tendon reflexes are symmetric.   Assessment/Plan:  1.  Behcet's disease  2.  Seizures  The patient seems to be doing fairly well on Keppra and Lamictal currently, we will check blood levels today.  The patient will follow-up in 6 months.  He will continue chemotherapy for his Behcet's disease.  He will contact our office if any new issues arise.  Jill Alexanders MD 05/17/2017 7:49 AM  [facility] [address] [place], [place] [address]  Phone [phone] Fax [phone]

## 2017-05-20 LAB — LAMOTRIGINE LEVEL: Lamotrigine Lvl: 6.9 ug/mL (ref 2.0–20.0)

## 2017-05-20 LAB — LEVETIRACETAM LEVEL: Levetiracetam Lvl: 52 ug/mL — ABNORMAL HIGH (ref 10.0–40.0)

## 2017-07-05 ENCOUNTER — Telehealth: Payer: Self-pay | Admitting: *Deleted

## 2017-07-05 NOTE — Telephone Encounter (Signed)
Received fax notification from [employer] that PA approved until 07/06/2018. PA#: [id number]. Sent notice of approval to [facility] in [place], [place] at [phone]. Received fax confirmation.

## 2017-07-05 NOTE — Telephone Encounter (Signed)
Initiated PA lamotrigine 200mg  tab on covermymeds. Key: [id number]. In process of completing.

## 2017-07-05 NOTE — Telephone Encounter (Signed)
Submitted PA. Waiting on determination.   "[employer] is reviewing your PA request. Typically an electronic response will be received within 72 hours"

## 2017-07-11 ENCOUNTER — Telehealth: Payer: Self-pay | Admitting: Neurology

## 2017-07-11 NOTE — Telephone Encounter (Signed)
I called the nurse practitioner.  The clonazepam is not very effective for the patient's anxiety.  They want to take him off the medication, they will do this very slowly by 1/2 tablet every month until off the drug.  I think that this is a reasonable approach, he has been on this medication for quite some time and a slow withdrawal would be necessary to prevent seizures.

## 2017-07-11 NOTE — Telephone Encounter (Signed)
Nurse Raysal has called re: the management of clonazePAM (KLONOPIN) 0.5 MG tablet she does not feel the medication is helping pt much and would like to discuss that with Dr Jannifer Franklin. NP Lelon Frohlich is only in the center on Wed. Each week. She will be in office until 6, seeing patients until 5:30. She can be reached on mobile# [phone] if the call is not from [facility] # she is asking to be sent a text message before call to ensure she will answer the call

## 2017-09-06 ENCOUNTER — Encounter (HOSPITAL_COMMUNITY): Payer: Self-pay

## 2017-09-06 ENCOUNTER — Emergency Department (HOSPITAL_COMMUNITY): Payer: Medicare Other

## 2017-09-06 ENCOUNTER — Other Ambulatory Visit: Payer: Self-pay

## 2017-09-06 ENCOUNTER — Emergency Department (HOSPITAL_COMMUNITY)
Admission: EM | Admit: 2017-09-06 | Discharge: 2017-09-07 | Disposition: A | Discharge status: Home / Self Care | Payer: Medicare Other | Attending: Emergency Medicine | Admitting: Emergency Medicine

## 2017-09-06 DIAGNOSIS — R0789 Other chest pain: Secondary | ICD-10-CM | POA: Diagnosis not present

## 2017-09-06 DIAGNOSIS — I10 Essential (primary) hypertension: Secondary | ICD-10-CM | POA: Insufficient documentation

## 2017-09-06 DIAGNOSIS — Z79899 Other long term (current) drug therapy: Secondary | ICD-10-CM | POA: Insufficient documentation

## 2017-09-06 DIAGNOSIS — R079 Chest pain, unspecified: Secondary | ICD-10-CM | POA: Diagnosis present

## 2017-09-06 LAB — I-STAT CHEM 8, ED
BUN: 22 mg/dL — AB (ref 6–20)
Calcium, Ion: 1.17 mmol/L (ref 1.15–1.40)
Chloride: 106 mmol/L (ref 101–111)
Creatinine, Ser: 1.2 mg/dL (ref 0.61–1.24)
Glucose, Bld: 95 mg/dL (ref 65–99)
HEMATOCRIT: 45 % (ref 39.0–52.0)
Hemoglobin: 15.3 g/dL (ref 13.0–17.0)
Potassium: 3.6 mmol/L (ref 3.5–5.1)
Sodium: 140 mmol/L (ref 135–145)
TCO2: 22 mmol/L (ref 22–32)

## 2017-09-06 LAB — I-STAT TROPONIN, ED: Troponin i, poc: 0 ng/mL (ref 0.00–0.08)

## 2017-09-06 LAB — CBC
HCT: 43.9 % (ref 39.0–52.0)
HEMOGLOBIN: 14.9 g/dL (ref 13.0–17.0)
MCH: 32 pg (ref 26.0–34.0)
MCHC: 33.9 g/dL (ref 30.0–36.0)
MCV: 94.2 fL (ref 78.0–100.0)
PLATELETS: 187 10*3/uL (ref 150–400)
RBC: 4.66 MIL/uL (ref 4.22–5.81)
RDW: 12.2 % (ref 11.5–15.5)
WBC: 7.8 10*3/uL (ref 4.0–10.5)

## 2017-09-06 LAB — BASIC METABOLIC PANEL
ANION GAP: 10 (ref 5–15)
BUN: 23 mg/dL — ABNORMAL HIGH (ref 6–20)
CO2: 24 mmol/L (ref 22–32)
Calcium: 9.5 mg/dL (ref 8.9–10.3)
Chloride: 107 mmol/L (ref 101–111)
Creatinine, Ser: 1.33 mg/dL — ABNORMAL HIGH (ref 0.61–1.24)
GFR calc Af Amer: >60 mL/min (ref >60)
GFR calc non Af Amer: >60 mL/min (ref >60)
Glucose, Bld: 99 mg/dL (ref 65–99)
POTASSIUM: 3.7 mmol/L (ref 3.5–5.1)
SODIUM: 141 mmol/L (ref 135–145)

## 2017-09-06 MED ORDER — ONDANSETRON HCL 4 MG/2ML IJ SOLN
4.0000 mg | Freq: Once | INTRAMUSCULAR | Status: AC
  Administered 2017-09-06: 4 mg via INTRAVENOUS
  Filled 2017-09-06: qty 2

## 2017-09-06 MED ORDER — LORAZEPAM 2 MG/ML IJ SOLN
0.5000 mg | Freq: Once | INTRAMUSCULAR | Status: AC
  Administered 2017-09-06: 0.5 mg via INTRAVENOUS
  Filled 2017-09-06: qty 1

## 2017-09-06 MED ORDER — IOPAMIDOL (ISOVUE-370) INJECTION 76%
INTRAVENOUS | Status: AC
  Filled 2017-09-06: qty 100

## 2017-09-06 MED ORDER — MORPHINE SULFATE (PF) 4 MG/ML IV SOLN
4.0000 mg | Freq: Once | INTRAVENOUS | Status: AC
  Administered 2017-09-06: 4 mg via INTRAVENOUS
  Filled 2017-09-06: qty 1

## 2017-09-06 MED ORDER — GI COCKTAIL ~~LOC~~
30.0000 mL | Freq: Once | ORAL | Status: AC
  Administered 2017-09-06: 30 mL via ORAL
  Filled 2017-09-06: qty 30

## 2017-09-06 MED ORDER — SODIUM CHLORIDE 0.9 % IV SOLN
8.0000 mg | Freq: Once | INTRAVENOUS | Status: AC
  Administered 2017-09-06: 8 mg via INTRAVENOUS
  Filled 2017-09-06: qty 4

## 2017-09-06 MED ORDER — SODIUM CHLORIDE 0.9 % IV BOLUS
1000.0000 mL | Freq: Once | INTRAVENOUS | Status: AC
  Administered 2017-09-06: 1000 mL via INTRAVENOUS

## 2017-09-06 MED ORDER — IOPAMIDOL (ISOVUE-370) INJECTION 76%
100.0000 mL | Freq: Once | INTRAVENOUS | Status: AC | PRN
  Administered 2017-09-06: 100 mL via INTRAVENOUS

## 2017-09-06 NOTE — ED Triage Notes (Signed)
Pt c/o a sudden sharp pain in central left chest radiating into shoulder and face starting about 20 minutes ago. Pt became suddenly diaphoretic. Pt reports he just "feels like crap." C/o some shortness of breath and some chest pain.

## 2017-09-06 NOTE — ED Notes (Signed)
Patient transported to CT 

## 2017-09-06 NOTE — Discharge Instructions (Signed)
Return to the ED for any worsening.  Follow up with your PCP, call them in the morning with your symptoms.

## 2017-09-06 NOTE — ED Provider Notes (Signed)
[facility] DEPT Provider Note   CSN: [id number] Arrival date & time: 09/06/17  2036     History   Chief Complaint Chief Complaint  Patient presents with  . Chest Pain    HPI [name] [name] is a 37 y.o. male.  37 yo M with a chief complaint of sudden onset chest pain that radiated to the back in the left neck.  He described this is sharp and severe.  Associated with shortness of breath and fatigue.  He felt that he got very diaphoretic and sweated through his shirt during this.  He denies cough congestion fevers chills or myalgias.  Denies hemoptysis.  Denies history of PE or DVT.  The patient's pain has improved significantly though now he just feels bad.  Thinks he is felt unwell for the past week or so.  Denies any infectious symptoms.  Denies diarrhea or vomiting.  Denies abdominal pain.  Denies sick contacts.  The history is provided by the patient.  Chest Pain   This is a new problem. The current episode started less than 1 hour ago. The problem occurs constantly. The problem has been rapidly improving. The pain is associated with rest. The pain is present in the substernal region. The pain is at a severity of 10/10. The pain is severe. The quality of the pain is described as stabbing and sharp. The pain radiates to the left jaw and upper back. Duration of episode(s) is 1 hour. Associated symptoms include diaphoresis and shortness of breath. Pertinent negatives include no abdominal pain, no fever, no headaches, no palpitations and no vomiting. He has tried nothing for the symptoms. The treatment provided no relief.    Past Medical History:  Diagnosis Date  . Behcet's disease ([place])   . Bipolar disorder ([place])   . Hypertension   . Migraine   . Seizures ([place])    last experienced a seizure x 3 years ago  . Vitamin D deficiency     Patient Active Problem List   Diagnosis Date Noted  . Lactic acidosis   . Acute respiratory failure with hypoxia ([place])     . Seizures ([place])   . Post-ictal coma ([place]) 08/03/2016  . Acute respiratory failure with hypoxemia ([place])   . Seizure ([place])   . Altered mental status 07/07/2016  . Peripheral vertigo 07/17/2015  . Vertigo 07/17/2015  . Intractable vomiting 07/17/2015  . Diplopia   . Cellulitis and abscess of trunk-right chest wall 05/08/2013  . Dehydration 02/15/2013  . Behcet's disease ([place]) 02/15/2013  . Recurrent vomiting 02/15/2013  . Encounter for therapeutic drug monitoring 06/14/2012  . History of oral aphthous ulcers 10/19/2010  . BIPOLAR DISORDER UNSPECIFIED 06/18/2008  . Essential hypertension 06/18/2008  . SYNCOPE AND COLLAPSE 06/18/2008  . Epilepsy ([place]) 06/18/2008    Past Surgical History:  Procedure Laterality Date  . APPENDECTOMY    . FRACTURE SURGERY Right    Arm  . maxilofacial    . NISSEN FUNDOPLICATION          Home Medications    Prior to Admission medications   Medication Sig Start Date End Date Taking? Authorizing Provider  clonazePAM (KLONOPIN) 0.5 MG tablet Take 1 tablet (0.5 mg total) by mouth 3 (three) times daily. For anxiety 02/16/13  Yes Robbie Lis, MD  cyclophosphamide (CYTOXAN) 50 MG capsule Take 150 mg by mouth daily. 12/06/16  Yes [provider]  FLUoxetine (PROZAC) 20 MG/5ML solution Take 60 mg by mouth daily. 11/27/16  Yes [provider]  LamoTRIgine 200 MG TBDP take 200 mg by mouth twice daily 02/02/17  Yes [provider]  levETIRAcetam (KEPPRA) 100 MG/ML solution Take 7 mLs (700 mg total) by mouth 2 (two) times daily. 01/03/17  Yes Kathrynn Ducking, MD  mycophenolate (CELLCEPT) 500 MG tablet Take 500 mg by mouth 2 (two) times daily.  03/20/17  Yes [provider]  QUEtiapine (SEROQUEL) 200 MG tablet TK 1 T PO QD AT 7 PM OR AS DIRECTED 08/29/17  Yes [provider]    Family History Family History  Problem Relation Age of Onset  . Lung cancer Paternal Grandfather   . Other Father        BPPV     Social History Social History   Tobacco Use  . Smoking status: Never Smoker  . Smokeless tobacco: Never Used  Substance Use Topics  . Alcohol use: No  . Drug use: No     Allergies   Patient has no known allergies.   Review of Systems Review of Systems  Constitutional: Positive for diaphoresis. Negative for chills and fever.  HENT: Negative for congestion and facial swelling.   Eyes: Negative for discharge and visual disturbance.  Respiratory: Positive for shortness of breath.   Cardiovascular: Positive for chest pain. Negative for palpitations.  Gastrointestinal: Negative for abdominal pain, diarrhea and vomiting.  Musculoskeletal: Negative for arthralgias and myalgias.  Skin: Negative for color change and rash.  Neurological: Negative for tremors, syncope and headaches.  Psychiatric/Behavioral: Negative for confusion and dysphoric mood.     Physical Exam Updated Vital Signs BP 124/71   Pulse 91   Temp 98.3 F (36.8 C) (Oral)   Resp (!) 33   SpO2 97%   Physical Exam  Constitutional: He is oriented to person, place, and time. He appears well-developed and well-nourished.  HENT:  Head: Normocephalic and atraumatic.  Eyes: Pupils are equal, round, and reactive to light. EOM are normal.  Neck: Normal range of motion. Neck supple. No JVD present.  Cardiovascular: Normal rate and regular rhythm. Exam reveals no gallop and no friction rub.  No murmur heard. Pulmonary/Chest: No respiratory distress. He has no wheezes. He exhibits no tenderness.  Abdominal: He exhibits no distension and no mass. There is no tenderness. There is no rebound and no guarding.  Musculoskeletal: Normal range of motion.  Neurological: He is alert and oriented to person, place, and time.  Skin: No rash noted. No pallor.  Psychiatric: He has a normal mood and affect. His behavior is normal.  Nursing note and vitals reviewed.    ED Treatments / Results  Labs (all labs ordered are  listed, but only abnormal results are displayed) Labs Reviewed  BASIC METABOLIC PANEL - Abnormal; Notable for the following components:      Result Value   BUN 23 (*)    Creatinine, Ser 1.33 (*)    All other components within normal limits  I-STAT CHEM 8, ED - Abnormal; Notable for the following components:   BUN 22 (*)    All other components within normal limits  CBC  I-STAT TROPONIN, ED  I-STAT TROPONIN, ED    EKG EKG Interpretation  Date/Time:  Thursday September 06 2017 20:46:13 EDT Ventricular Rate:  119 PR Interval:    QRS Duration: 87 QT Interval:  292 QTC Calculation: 411 R Axis:   65 Text Interpretation:  Sinus tachycardia Lateral leads are also involved No significant change since last tracing Confirmed by Deno Etienne (  [id number]) on 09/06/2017 8:55:48 PM   EKG Interpretation  Date/Time:  Thursday September 06 2017 22:31:01 EDT Ventricular Rate:  94 PR Interval:    QRS Duration: 96 QT Interval:  340 QTC Calculation: 426 R Axis:   56 Text Interpretation:  Sinus rhythm Borderline repolarization abnormality Since last tracing rate slower Otherwise no significant change Confirmed by Deno Etienne [phone]) on 09/06/2017 10:50:22 PM        Radiology Dg Chest 2 View  Result Date: 09/06/2017 CLINICAL DATA:  Acute onset of left-sided chest pain and shortness of breath. EXAM: CHEST - 2 VIEW COMPARISON:  Chest radiograph performed 08/04/2016 FINDINGS: The lungs are well-aerated. Mild vascular congestion and vascular crowding are noted. There is no evidence of focal opacification, pleural effusion or pneumothorax. The heart is mildly enlarged. No acute osseous abnormalities are seen. IMPRESSION: Mild vascular congestion and mild cardiomegaly; lungs remain grossly clear. Electronically Signed   By: Garald Balding M.D.   On: 09/06/2017 22:05   Ct Angio Chest/abd/pel For Dissection W And/or Wo Contrast  Result Date: 09/06/2017 CLINICAL DATA:  Acute onset of left upper chest pain, radiating to left  side of the face. Shortness of breath. EXAM: CT ANGIOGRAPHY CHEST, ABDOMEN AND PELVIS TECHNIQUE: Multidetector CT imaging through the chest, abdomen and pelvis was performed using the standard protocol during bolus administration of intravenous contrast. Multiplanar reconstructed images and MIPs were obtained and reviewed to evaluate the vascular anatomy. CONTRAST:  125mL ISOVUE-370 IOPAMIDOL (ISOVUE-370) INJECTION 76% COMPARISON:  None. FINDINGS: CTA CHEST FINDINGS Cardiovascular: There is no evidence of aortic dissection. There is no evidence of aneurysmal dilatation. No calcific atherosclerotic disease is seen. The great vessels are unremarkable in appearance. There is no evidence of significant pulmonary embolus. The heart is normal in size. Mediastinum/Nodes: The mediastinum is unremarkable. No mediastinal lymphadenopathy is seen. No pericardial effusion is identified. The visualized portions of the thyroid gland are unremarkable. No axillary lymphadenopathy is appreciated. Lungs/Pleura: Minimal bilateral atelectasis is noted. No pleural effusion or pneumothorax is seen. No masses are identified. Musculoskeletal: No acute osseous abnormalities are identified. The visualized musculature is unremarkable in appearance. Review of the MIP images confirms the above findings. CTA ABDOMEN AND PELVIS FINDINGS VASCULAR Aorta: There is no evidence of aortic dissection. There is no evidence of aneurysmal dilatation. No calcific atherosclerotic disease is seen. Celiac: The celiac trunk is unremarkable in appearance. SMA: The superior mesenteric artery is within normal limits. Renals: The renal arteries appear intact bilaterally. IMA: The inferior mesenteric artery is grossly unremarkable. Inflow: The common, internal and external iliac arteries are unremarkable. Veins: Visualized venous structures are grossly unremarkable. Review of the MIP images confirms the above findings. NON-VASCULAR Hepatobiliary: The liver is  unremarkable in appearance. The gallbladder is unremarkable in appearance. The common bile duct remains normal in caliber. Pancreas: The pancreas is within normal limits. Spleen: The spleen is unremarkable in appearance. Adrenals/Urinary Tract: The adrenal glands are unremarkable in appearance. The kidneys are within normal limits. There is no evidence of hydronephrosis. No renal or ureteral stones are identified. No perinephric stranding is seen. Stomach/Bowel: The stomach is unremarkable in appearance. The small bowel is within normal limits. The patient is status post appendectomy. The colon is unremarkable in appearance. Lymphatic: No retroperitoneal or pelvic sidewall lymphadenopathy is seen. Reproductive: The bladder is mildly distended and grossly unremarkable. The prostate is normal in size. Other: No additional soft tissue abnormalities are seen. Musculoskeletal: No acute osseous abnormalities are identified. The visualized musculature is unremarkable in appearance.  Review of the MIP images confirms the above findings. IMPRESSION: 1. No evidence of aortic dissection. No evidence of aneurysmal dilatation. No calcific atherosclerotic disease seen. 2. No evidence of significant pulmonary embolus. 3. Minimal bilateral atelectasis.  Lungs otherwise clear. 4. No acute abnormality seen within the abdomen or pelvis. Electronically Signed   By: Garald Balding M.D.   On: 09/06/2017 23:36    Procedures Procedures (including critical care time)  Medications Ordered in ED Medications  iopamidol (ISOVUE-370) 76 % injection (has no administration in time range)  sodium chloride 0.9 % bolus 1,000 mL (0 mLs Intravenous Stopped 09/06/17 2241)  morphine 4 MG/ML injection 4 mg (4 mg Intravenous Given 09/06/17 2125)  ondansetron (ZOFRAN) injection 4 mg (4 mg Intravenous Given 09/06/17 2124)  gi cocktail (Maalox,Lidocaine,Donnatal) (30 mLs Oral Given 09/06/17 2122)  morphine 4 MG/ML injection 4 mg (4 mg Intravenous Given  09/06/17 2250)  ondansetron (ZOFRAN) 8 mg in sodium chloride 0.9 % 50 mL IVPB (0 mg Intravenous Stopped 09/06/17 2329)  iopamidol (ISOVUE-370) 76 % injection 100 mL (100 mLs Intravenous Contrast Given 09/06/17 2255)  LORazepam (ATIVAN) injection 0.5 mg (0.5 mg Intravenous Given 09/06/17 2349)  morphine 4 MG/ML injection 4 mg (4 mg Intravenous Given 09/06/17 2353)     Initial Impression / Assessment and Plan / ED Course  I have reviewed the triage vital signs and the nursing notes.  Pertinent labs & imaging results that were available during my care of the patient were reviewed by me and considered in my medical decision making (see chart for details).     37 yo M with a chief complaint of sudden onset chest pain that radiated to his back and his neck.  This is somewhat improved but he just feels uncomfortable.  He has a history of Behcet's, will obtain a angiogram of the chest and pelvis to evaluate further.  Initial troponin is negative.  EKG is unchanged from prior.  The patient had recurrent symptoms while in the emergency department.  Repeat EKG without any change.  Awaiting CT Angio of the chest.  CT negative for dissection/pe.  Patient given morphine with some improvement of his symptoms, though does continue to be clammy and have tachypnea.  Tachycardia has resolved, trop negative, creatinine mildly worse than baseline.  Will ambulate in the ED and evaluate for hypoxia, though 100% on room air.  Delta trop at [place].  Discussed with Dr Marisue Humble, please see his note for further details of care in the ED.   The patients results and plan were reviewed and discussed.   Any x-rays performed were independently reviewed by myself.   Differential diagnosis were considered with the presenting HPI.  Medications  iopamidol (ISOVUE-370) 76 % injection (has no administration in time range)  sodium chloride 0.9 % bolus 1,000 mL (0 mLs Intravenous Stopped 09/06/17 2241)  morphine 4 MG/ML injection 4 mg (4 mg  Intravenous Given 09/06/17 2125)  ondansetron (ZOFRAN) injection 4 mg (4 mg Intravenous Given 09/06/17 2124)  gi cocktail (Maalox,Lidocaine,Donnatal) (30 mLs Oral Given 09/06/17 2122)  morphine 4 MG/ML injection 4 mg (4 mg Intravenous Given 09/06/17 2250)  ondansetron (ZOFRAN) 8 mg in sodium chloride 0.9 % 50 mL IVPB (0 mg Intravenous Stopped 09/06/17 2329)  iopamidol (ISOVUE-370) 76 % injection 100 mL (100 mLs Intravenous Contrast Given 09/06/17 2255)  LORazepam (ATIVAN) injection 0.5 mg (0.5 mg Intravenous Given 09/06/17 2349)  morphine 4 MG/ML injection 4 mg (4 mg Intravenous Given 09/06/17 2353)    Vitals:  09/06/17 2200 09/06/17 2230 09/06/17 2310 09/06/17 2330  BP: 125/66 119/72 108/67 124/71  Pulse: 96 98 95 91  Resp: (!) 49 (!) 31 17 (!) 33  Temp:      TempSrc:      SpO2: 98% 100% 100% 97%    Final diagnoses:  Atypical chest pain      Final Clinical Impressions(s) / ED Diagnoses   Final diagnoses:  Atypical chest pain    ED Discharge Orders    None       Deno Etienne, DO 09/06/17 2357

## 2017-09-06 NOTE — ED Notes (Signed)
Bed: WA03 Expected date:  Expected time:  Means of arrival:  Comments: Triage 2

## 2017-09-07 DIAGNOSIS — R0789 Other chest pain: Secondary | ICD-10-CM | POA: Diagnosis not present

## 2017-09-07 LAB — I-STAT TROPONIN, ED: Troponin i, poc: 0 ng/mL (ref 0.00–0.08)

## 2017-09-07 NOTE — ED Notes (Signed)
Pt ambulated down hall to [employer] and back, SpO2 stayed at 99%

## 2017-09-14 ENCOUNTER — Other Ambulatory Visit: Payer: Self-pay | Admitting: Cardiology

## 2017-09-14 DIAGNOSIS — R072 Precordial pain: Secondary | ICD-10-CM

## 2017-09-14 NOTE — Progress Notes (Signed)
Cheri Guppy    Date of visit:  09/14/2017 DOB:  05-31-1980    Age:  37 yrs. Medical record number:  [id number]     Account number:  [id number] Primary Care Provider: [facility] ____________________________ CURRENT DIAGNOSES  1. Chest pain, unspecified  2. Hyperlipidemia  3. Essential hypertension  4. Gastro-esophageal reflux disease  5. Sleep apnea  6. Behcet's Disease  7. Obesity ____________________________ ALLERGIES  No Known Drug Allergies ____________________________ MEDICATIONS  1. fluoxetine 20 mg/5 mL (4 mg/mL) oral solution, 64mL daily  2. Keppra 100 mg/mL oral solution, 30mL Q 12 hrs  3. Klonopin 0.5 mg tablet, TID  4. Lamictal ODT 200 mg disintegrating tablet, BID  5. melatonin 5 mg tablet, QHS  6. mycophenolate mofetil 500 mg tablet, 2 p.o. b.i.d.  7. Seroquel 100 mg tablet, 1 p.o. daily ____________________________ CHIEF COMPLAINTS  Chest Pain ____________________________ HISTORY OF PRESENT ILLNESS  This 37 year old male is seen at the request of Dr. Drema Dallas for evaluation of chest discomfort and recent emergency room visit. The patient carries a diagnosis of Behcet's disease and is followed at [facility]. He has been on periods chemotherapeutic agents in the past. He has had skin manifestations, GI manifestations some ocular manifestations as well as seizures in the past. He also carries a diagnosis of recurrent depression anxiety and bipolar disorder. He is recently been started on CellCept. 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 He tries to exercise regularly. He has recently had since birth he has a very bad taste in his mouth and that this bed this will occur after he exercises. He had an episode of sharp chest pain that occurred associated with pretty severe sweating and was seen at the [facility] emergency room. The pain is not described as tightness or pressure but he states he feels horrible with the exercises. While in  the emergency room he had a CT scan that did not show any evidence of pulmonary emboli or any evidence of aneurysm. He normally denies PND, orthopnea, edema, syncope, or claudication. He has been tried on various things over the years. He has a history of severe reflux and has a previous Nissen fundoplication. He has been evaluated by electrophysiology and number of years ago with recurrent syncope and was felt to have neurally mediated syncope. He has been obese for a while and also has hypertension and that he as seen before. He does not have any pressure type pain suggestive of angina. ____________________________ PAST HISTORY  Past Medical Illnesses:  hypertension, hyperlipidemia, anxiety, depression, gastritis, hiatal hernia, migraine headaches, Seizures, sleep apnea, Behcet's disease, GERD, bipolar disease;  Cardiovascular Illnesses:  no previous history of cardiac disease;  Infectious Diseases:  no previous history of significant infectious diseases;  Surgical Procedures:  appendectomy, jaw surgery, right arm surgery, Nissen fundoplication;  Trauma History:  no previous history of significant trauma;  NYHA Classification:  I;  Canadian Angina Classification:  Class 0: Asymptomatic;  Cardiology Procedures-Invasive:  no previous interventional or invasive cardiology procedures;  Cardiology Procedures-Noninvasive:  no previous non-invasive cardiovascular testing;  Peripheral Vascular Procedures:  no previous invasive peripheral vascular procedures.;  LVEF not documented,   ____________________________ CARDIO-PULMONARY TEST DATES EKG Date:  09/14/2017;   ____________________________ FAMILY HISTORY Father -- Father alive with problem, Diabetes mellitus Mother -- Mother alive with problem, Myocardial infarction at greater than 106 Sister -- Sister alive and well ____________________________ SOCIAL HISTORY Alcohol Use:  does not use alcohol;  Smoking:  never  smoked;  Diet:  regular diet;  Lifestyle:   single;  Exercise:  some exercise;  Occupation:  disabled;  Residence:  lives with parents;   ____________________________ REVIEW OF SYSTEMS General:  obesity, weight gain  Integumentary:skin lesions Eyes: denies diplopia, history of glaucoma or visual problems. Ears, Nose, Throat, Mouth:  denies any hearing loss, epistaxis, hoarseness or difficulty speaking. Respiratory: denies dyspnea, cough, wheezing or hemoptysis. Cardiovascular:  please review HPI Abdominal: denies dyspepsia, GI bleeding, constipation, or diarrhea Genitourinary-Male: no dysuria, urgency, frequency, or nocturia  Musculoskeletal:  denies arthritis, venous insufficiency, or muscle weakness Neurological:  denies headaches, stroke, or TIA Psychiatric:  anxiety, depression Hematological/Immunologic:  denies any food allergies, bleeding disorders. ____________________________ PHYSICAL EXAMINATION VITAL SIGNS  Blood Pressure:  120/90 Standing, Right arm, large cuff  , 110/90 Sitting, Right arm and large cuff   Pulse:  100/min. Weight:  249.00 lbs. Height:  72"BMI: 34  Constitutional:  pleasant white male in no acute distress, moderately obese Skin:  tattoos on arms Head:  normocephalic, normal hair pattern, no masses or tenderness Eyes:  EOMS Intact, PERRLA, C and S clear, Funduscopic exam not done. ENT:  ears, nose and throat reveal no gross abnormalities.  Dentition good. Neck:  supple, without massess. No JVD, thyromegaly or carotid bruits. Carotid upstroke normal. Chest:  normal symmetry, clear to auscultation. Cardiac:  regular rhythm, normal S1 and S2, No S3 or S4, no murmurs, gallops or rubs detected. Abdomen:  abdomen soft,non-tender, no masses, no hepatospenomegaly, or aneurysm noted, diastasis recti Peripheral Pulses:  the femoral,dorsalis pedis, and posterior tibial pulses are full and equal bilaterally with no bruits auscultated. Extremities & Back:  no deformities, clubbing, cyanosis, erythema or edema observed. Normal  muscle strength and tone. Neurological:  no gross motor or sensory deficits noted, affect appropriate, oriented x3. ____________________________ MOST RECENT LIPID PANEL 08/03/16  TRIGLYCER 417 mg/dl ____________________________ IMPRESSIONS/PLAN  1. Chest discomfort sharp with recent emergency room visit with some atypical features 2. Behcet's disease with multiorgan involvement 3. Obesity 4. Hypertension 5. Anxiety depression and prior history of bipolar disorder  Recommendations:  The patient has an abnormal EKG that was personally reviewed by me. He has some inferolateral T wave inversions. I have recommended that he have a echocardiogram to evaluate LV wall thickness and look for pericardial effusion. Sometimes Behcet's disease can have involvement of all sizes of the arteries including coronary aneurysms. In light of his recent chest discomfort I recommended that he have a cardiac CTA to evaluate this rather than stress testing. I reviewed his records from [facility] as well as recent emergency room visits. He is quite complicated man and I will see him back following review of the above. Thank you for asking me to see him with you.  ____________________________ TODAYS ORDERS  1. 2D, color flow, doppler: At Patient [facility]  2. 12 Lead EKG: Today  3. Return following diagnostic testing  4. Cardiac CTA: First Available                       ____________________________ Cardiology Physician:  Kerry Hough MD [facility]

## 2017-10-04 ENCOUNTER — Other Ambulatory Visit: Payer: Self-pay | Admitting: Neurology

## 2017-10-16 ENCOUNTER — Ambulatory Visit (HOSPITAL_COMMUNITY)
Admission: RE | Admit: 2017-10-16 | Discharge: 2017-10-16 | Disposition: A | Discharge status: Home / Self Care | Payer: Medicare Other | Source: Ambulatory Visit | Attending: Cardiology | Admitting: Cardiology

## 2017-10-16 ENCOUNTER — Other Ambulatory Visit: Payer: Self-pay

## 2017-10-16 ENCOUNTER — Encounter (HOSPITAL_COMMUNITY): Payer: Self-pay

## 2017-10-16 ENCOUNTER — Emergency Department (HOSPITAL_COMMUNITY)
Admission: EM | Admit: 2017-10-16 | Discharge: 2017-10-16 | Disposition: A | Discharge status: Home / Self Care | Payer: Medicare Other | Attending: Emergency Medicine | Admitting: Emergency Medicine

## 2017-10-16 DIAGNOSIS — Z79899 Other long term (current) drug therapy: Secondary | ICD-10-CM | POA: Diagnosis not present

## 2017-10-16 DIAGNOSIS — R55 Syncope and collapse: Secondary | ICD-10-CM | POA: Diagnosis present

## 2017-10-16 DIAGNOSIS — I10 Essential (primary) hypertension: Secondary | ICD-10-CM | POA: Diagnosis not present

## 2017-10-16 DIAGNOSIS — M352 Behcet's disease: Secondary | ICD-10-CM

## 2017-10-16 DIAGNOSIS — I2510 Atherosclerotic heart disease of native coronary artery without angina pectoris: Secondary | ICD-10-CM

## 2017-10-16 DIAGNOSIS — R079 Chest pain, unspecified: Secondary | ICD-10-CM | POA: Diagnosis not present

## 2017-10-16 DIAGNOSIS — R072 Precordial pain: Secondary | ICD-10-CM | POA: Insufficient documentation

## 2017-10-16 LAB — MAGNESIUM: Magnesium: 2.1 mg/dL (ref 1.7–2.4)

## 2017-10-16 LAB — CBC WITH DIFFERENTIAL/PLATELET
Abs Immature Granulocytes: 0.1 10*3/uL (ref 0.0–0.1)
BASOS ABS: 0 10*3/uL (ref 0.0–0.1)
BASOS PCT: 0 %
EOS PCT: 3 %
Eosinophils Absolute: 0.2 10*3/uL (ref 0.0–0.7)
HCT: 42.6 % (ref 39.0–52.0)
Hemoglobin: 13.9 g/dL (ref 13.0–17.0)
Immature Granulocytes: 1 %
Lymphocytes Relative: 16 %
Lymphs Abs: 1.3 10*3/uL (ref 0.7–4.0)
MCH: 31.1 pg (ref 26.0–34.0)
MCHC: 32.6 g/dL (ref 30.0–36.0)
MCV: 95.3 fL (ref 78.0–100.0)
MONO ABS: 0.6 10*3/uL (ref 0.1–1.0)
Monocytes Relative: 8 %
NEUTROS ABS: 5.6 10*3/uL (ref 1.7–7.7)
Neutrophils Relative %: 72 %
PLATELETS: 179 10*3/uL (ref 150–400)
RBC: 4.47 MIL/uL (ref 4.22–5.81)
RDW: 11.9 % (ref 11.5–15.5)
WBC: 7.7 10*3/uL (ref 4.0–10.5)

## 2017-10-16 LAB — COMPREHENSIVE METABOLIC PANEL
ALT: 54 U/L — ABNORMAL HIGH (ref 0–44)
ANION GAP: 7 (ref 5–15)
AST: 32 U/L (ref 15–41)
Albumin: 3.9 g/dL (ref 3.5–5.0)
Alkaline Phosphatase: 82 U/L (ref 38–126)
BUN: 10 mg/dL (ref 6–20)
CALCIUM: 9.5 mg/dL (ref 8.9–10.3)
CHLORIDE: 103 mmol/L (ref 98–111)
CO2: 27 mmol/L (ref 22–32)
Creatinine, Ser: 1.19 mg/dL (ref 0.61–1.24)
GFR calc non Af Amer: >60 mL/min (ref >60)
Glucose, Bld: 100 mg/dL — ABNORMAL HIGH (ref 70–99)
Potassium: 4.3 mmol/L (ref 3.5–5.1)
Sodium: 137 mmol/L (ref 135–145)
Total Bilirubin: 0.6 mg/dL (ref 0.3–1.2)
Total Protein: 7 g/dL (ref 6.5–8.1)

## 2017-10-16 MED ORDER — HYDROCODONE-ACETAMINOPHEN 5-325 MG PO TABS
1.0000 | ORAL_TABLET | Freq: Once | ORAL | Status: AC
  Administered 2017-10-16: 1 via ORAL
  Filled 2017-10-16: qty 1

## 2017-10-16 MED ORDER — NITROGLYCERIN 0.4 MG SL SUBL
0.8000 mg | SUBLINGUAL_TABLET | SUBLINGUAL | Status: DC | PRN
  Administered 2017-10-16: 0.8 mg via SUBLINGUAL
  Filled 2017-10-16 (×2): qty 25

## 2017-10-16 MED ORDER — NITROGLYCERIN 0.4 MG SL SUBL
SUBLINGUAL_TABLET | SUBLINGUAL | Status: AC
  Administered 2017-10-16: 0.8 mg via SUBLINGUAL
  Filled 2017-10-16: qty 2

## 2017-10-16 MED ORDER — IOPAMIDOL (ISOVUE-370) INJECTION 76%
INTRAVENOUS | Status: AC
  Filled 2017-10-16: qty 100

## 2017-10-16 MED ORDER — IOPAMIDOL (ISOVUE-370) INJECTION 76%
100.0000 mL | Freq: Once | INTRAVENOUS | Status: AC | PRN
  Administered 2017-10-16: 100 mL via INTRAVENOUS

## 2017-10-16 MED ORDER — METOPROLOL TARTRATE 5 MG/5ML IV SOLN
INTRAVENOUS | Status: AC
  Administered 2017-10-16: 5 mg via INTRAVENOUS
  Filled 2017-10-16: qty 20

## 2017-10-16 MED ORDER — METOPROLOL TARTRATE 5 MG/5ML IV SOLN
5.0000 mg | INTRAVENOUS | Status: DC | PRN
  Administered 2017-10-16 (×3): 5 mg via INTRAVENOUS
  Filled 2017-10-16 (×4): qty 5

## 2017-10-16 NOTE — ED Provider Notes (Signed)
[facility] EMERGENCY DEPARTMENT Provider Note   CSN: [id number] Arrival date & time: 10/16/17  1617     History   Chief Complaint Chief Complaint  Patient presents with  . Seizures    HPI [name] [name] is a 37 y.o. male.  Patient has a history of mitral valve problem and was receiving a CT scan to evaluate the valves.  He was given a dose of metoprolol and 3 nitroglycerin just prior to the study.  Shortly after the study started patient syncopized and apparently had thready pulses.  Rapid response was called patient brought here for further evaluation.  On arrival here patient is awakening in complaint of chest pain where he apparently had a sternal rub.  He has a history of seizures but those generally are general tonic-clonic movements.   Loss of Consciousness   Associated symptoms include seizures. He has tried nothing for the symptoms. The treatment provided no relief.    Past Medical History:  Diagnosis Date  . Behcet's disease ([place])   . Bipolar disorder ([place])   . Hypertension   . Migraine   . Seizures ([place])    last experienced a seizure x 3 years ago  . Vitamin D deficiency     Patient Active Problem List   Diagnosis Date Noted  . Lactic acidosis   . Acute respiratory failure with hypoxia ([place])   . Seizures ([place])   . Post-ictal coma ([place]) 08/03/2016  . Acute respiratory failure with hypoxemia ([place])   . Seizure ([place])   . Altered mental status 07/07/2016  . Peripheral vertigo 07/17/2015  . Vertigo 07/17/2015  . Intractable vomiting 07/17/2015  . Diplopia   . Cellulitis and abscess of trunk-right chest wall 05/08/2013  . Dehydration 02/15/2013  . Behcet's disease ([place]) 02/15/2013  . Recurrent vomiting 02/15/2013  . Encounter for therapeutic drug monitoring 06/14/2012  . History of oral aphthous ulcers 10/19/2010  . BIPOLAR DISORDER UNSPECIFIED 06/18/2008  . Essential hypertension 06/18/2008  . SYNCOPE AND COLLAPSE 06/18/2008  . Epilepsy  ([place]) 06/18/2008    Past Surgical History:  Procedure Laterality Date  . APPENDECTOMY    . FRACTURE SURGERY Right    Arm  . maxilofacial    . NISSEN FUNDOPLICATION          Home Medications    Prior to Admission medications   Medication Sig Start Date End Date Taking? Authorizing Provider  clonazePAM (KLONOPIN) 0.5 MG tablet Take 1 tablet (0.5 mg total) by mouth 3 (three) times daily. For anxiety Patient taking differently: Take 0.5 mg by mouth See admin instructions. Take 0.25 mg by mouth in the morning then 0.25 mg midday then 0.25 mg at bedtime 02/16/13  Yes Robbie Lis, MD  FLUoxetine [facility]) 20 MG/5ML solution Take 60 mg by mouth at bedtime.  11/27/16  Yes [provider]  LamoTRIgine 200 MG TBDP Dissolve 200 mg in the mouth two times a day- morning and bedtime 02/02/17  Yes [provider]  levETIRAcetam (KEPPRA) 100 MG/ML solution Take 7 mLs (700 mg total) by mouth 2 (two) times daily. 01/03/17  Yes Kathrynn Ducking, MD  mycophenolate (CELLCEPT) 500 MG tablet Take 1,000 mg by mouth 2 (two) times daily.  03/20/17  Yes [provider]  QUEtiapine (SEROQUEL) 100 MG tablet Take 150 mg by mouth at bedtime.   Yes [provider]    Family History Family History  Problem Relation Age of Onset  . Lung cancer Paternal Grandfather   .  Other Father        BPPV    Social History Social History   Tobacco Use  . Smoking status: Never Smoker  . Smokeless tobacco: Never Used  Substance Use Topics  . Alcohol use: No  . Drug use: No     Allergies   Patient has no known allergies.   Review of Systems Review of Systems  Cardiovascular: Positive for syncope.  Neurological: Positive for seizures.  All other systems reviewed and are negative.    Physical Exam Updated Vital Signs BP 98/64   Pulse 73   Temp (!) 97.4 F (36.3 C) (Oral)   Resp 14   Ht 6\' 1"  (1.854 m)   Wt 113.4 kg (250 lb)   SpO2 100%   BMI 32.98 kg/m    Physical Exam  Constitutional: He is oriented to person, place, and time. He appears well-developed and well-nourished.  HENT:  Head: Normocephalic and atraumatic.  Eyes: Conjunctivae and EOM are normal.  Neck: Normal range of motion.  Cardiovascular: Normal rate.  Pulmonary/Chest: Effort normal. No respiratory distress. He exhibits tenderness (over left chest where sternal rubbed).  Abdominal: He exhibits no distension.  Musculoskeletal: Normal range of motion.  Neurological: He is alert and oriented to person, place, and time. He displays normal reflexes. Coordination normal.  Skin: Skin is warm and dry.  Nursing note and vitals reviewed.   ED Treatments / Results  Labs (all labs ordered are listed, but only abnormal results are displayed) Labs Reviewed  COMPREHENSIVE METABOLIC PANEL - Abnormal; Notable for the following components:      Result Value   Glucose, Bld 100 (*)    ALT 54 (*)    All other components within normal limits  CBC WITH DIFFERENTIAL/PLATELET  MAGNESIUM    EKG EKG Interpretation  Date/Time:  Tuesday October 16 2017 16:18:08 EDT Ventricular Rate:  69 PR Interval:    QRS Duration: 102 QT Interval:  408 QTC Calculation: 438 R Axis:   57 Text Interpretation:  Sinus rhythm Borderline T abnormalities, inferior leads No significant change since last tracing in june 2019 Confirmed by Merrily Pew [phone]) on 10/16/2017 4:40:38 PM   Radiology Ct Coronary Morph W/cta Cor W/score W/ca W/cm &/or Wo/cm  Addendum Date: 10/16/2017   ADDENDUM REPORT: 10/16/2017 16:51 CLINICAL DATA:  Chest pain EXAM: Cardiac CTA MEDICATIONS: Sub lingual nitro.  4mg  x 2 and lopressor 5mg  IV TECHNIQUE: The patient was scanned on a Siemens 616 slice scanner. Gantry rotation speed was 250 msecs. Collimation was 0.8 mm. A 100 kV prospective scan was triggered in the ascending thoracic aorta at 35-75% of the R-R interval. Average HR during the scan was 60 bpm. The 3D data set was  interpreted on a dedicated work station using MPR, MIP and VRT modes. A total of 80cc of contrast was used. FINDINGS: Non-cardiac: See separate report from [facility]. Calcium Score: There is a small spot of calcium in the proximal LAD but it will not register on the calcium score software. Coronary Arteries: Right dominant with no anomalies LM: No plaque or stenosis. LAD system: Mixed plaque with mild stenosis (< 50%) in the proximal LAD. Circumflex system: Images degraded by misregistration artifact, but I do not see significant disease. RCA system: No plaque or stenosis. IMPRESSION: 1.  Mixed plaque with mild stenosis in the proximal LAD. 2. Small area of calcification proximal LAD but will not register on software for calcium scoring. [employer] Electronically Signed   By: Kirk Ruths  Mclean M.D.   On: 10/16/2017 16:51   Result Date: 10/16/2017 EXAM: OVER-READ INTERPRETATION  CT CHEST The following report is an over-read performed by radiologist Dr. Rolm Baptise of [facility], [place] on 10/16/2017. This over-read does not include interpretation of cardiac or coronary anatomy or pathology. The coronary CTA interpretation by the cardiologist is attached. COMPARISON:  09/06/2017 FINDINGS: Vascular: Heart is normal size.  Visualized aorta is normal caliber. Mediastinum/Nodes: No adenopathy in the lower mediastinum or hila. Lungs/Pleura: Visualized lungs clear.  No effusions. Upper Abdomen: Imaging into the upper abdomen shows no acute findings. Musculoskeletal: Chest wall soft tissues are unremarkable. No acute bony abnormality. IMPRESSION: No acute or significant extracardiac abnormality. Electronically Signed: By: Rolm Baptise M.D. On: 10/16/2017 16:04    Procedures Procedures (including critical care time)  Medications Ordered in ED Medications  HYDROcodone-acetaminophen (NORCO/VICODIN) 5-325 MG per tablet 1 tablet (1 tablet Oral Given 10/16/17 1814)     Initial Impression / Assessment  and Plan / ED Course  I have reviewed the triage vital signs and the nursing notes.  Pertinent labs & imaging results that were available during my care of the patient were reviewed by me and considered in my medical decision making (see chart for details).  Clinical Course as of Oct 17 2310  Tue Oct 16, 2017  1640 EKG 12-Lead [NL]  1654 Comprehensive metabolic panel [NL]    Clinical Course User Index [NL] Rodena Medin    Suspect patient likely syncopized secondary to low blood pressure with nitroglycerin and metoprolol.  Could also be seizures but does however his work-up here is negative.  He is able to ablate without difficulty.  No more syncopal episodes.  On the monitor multiple hours without abnormality.  Patient stable for discharge with PCP and cardiology follow-up  Final Clinical Impressions(s) / ED Diagnoses   Final diagnoses:  Syncope, unspecified syncope type    ED Discharge Orders    None       Reatha Sur, Corene Cornea, MD 10/16/17 2315

## 2017-10-16 NOTE — ED Triage Notes (Signed)
Per RN pt was in CT and became nonresponsive, with thready pulse. Pt has history of seizures. Pt arrived and is responding to voice. VSS.

## 2017-10-16 NOTE — Progress Notes (Signed)
CT called for pt unresponsive. On arrival pt with blank stare, weak pulse, breathing even and unlabored. No response to sternal rub. Pt transported to ED. VSS

## 2017-10-24 ENCOUNTER — Other Ambulatory Visit: Payer: Self-pay | Admitting: Neurology

## 2017-10-29 ENCOUNTER — Other Ambulatory Visit: Payer: Self-pay | Admitting: Neurology

## 2017-10-29 MED ORDER — LAMOTRIGINE 200 MG PO TBDP: ORAL_TABLET | ORAL | 3 refills | Status: DC

## 2017-11-14 ENCOUNTER — Encounter: Payer: Self-pay | Admitting: Neurology

## 2017-11-14 ENCOUNTER — Ambulatory Visit (INDEPENDENT_AMBULATORY_CARE_PROVIDER_SITE_OTHER): Payer: Medicare Other | Admitting: Neurology

## 2017-11-14 VITALS — BP 141/91 | HR 104 | Ht 73.0 in | Wt 253.0 lb

## 2017-11-14 DIAGNOSIS — R569 Unspecified convulsions: Secondary | ICD-10-CM | POA: Diagnosis not present

## 2017-11-14 DIAGNOSIS — M352 Behcet's disease: Secondary | ICD-10-CM | POA: Diagnosis not present

## 2017-11-14 NOTE — Progress Notes (Signed)
Reason for visit: Behcet's disease, seizures  [name] [name] is an 37 y.o. male  History of present illness:  [name] [name] is a 37 year old right-handed white male with a history of Behcet's disease, bipolar disorder, and seizures.  The patient has not had any definite seizures since last seen, he remains on Keppra and Lamictal.  He has been in the emergency room on 2 occasions recently, one on 6 June for atypical chest pain, he was seen on 16 October 2017 for syncopal event that occurred during a CT procedure, the patient had received metoprolol and nitroglycerin.  The patient began to feel poorly, his heart rates are usually in the 100+ range but then dropped into the 50s, his blood pressure dropped out and he had syncope.  No seizure was observed.  The patient has gained a lot of weight recently, he is now on CPAP for sleep apnea.  He has seen a cardiologist as he is having a lot of dyspnea on exertion and exercise intolerance.  The patient returns to this office for an evaluation.  Past Medical History:  Diagnosis Date  . Behcet's disease ([place])   . Bipolar disorder ([place])   . Hypertension   . Migraine   . Seizures ([place])    last experienced a seizure x 3 years ago  . Vitamin D deficiency     Past Surgical History:  Procedure Laterality Date  . APPENDECTOMY    . FRACTURE SURGERY Right    Arm  . maxilofacial    . NISSEN FUNDOPLICATION      Family History  Problem Relation Age of Onset  . Lung cancer Paternal Grandfather   . Other Father        BPPV    Social history:  reports that he has never smoked. He has never used smokeless tobacco. He reports that he does not drink alcohol or use drugs.   No Known Allergies  Medications:  Prior to Admission medications   Medication Sig Start Date End Date Taking? Authorizing Provider  Apremilast 30 MG TABS Take 2 tablets by mouth daily. 11/07/17  Yes [provider]  clonazePAM (KLONOPIN) 0.5 MG tablet Take 1 tablet (0.5 mg total)  by mouth 3 (three) times daily. For anxiety Patient taking differently: Take 0.5 mg by mouth See admin instructions. Take 0.25 mg by mouth in the morning then 0.25 mg midday then 0.25 mg at bedtime 02/16/13  Yes Robbie Lis, MD  FLUoxetine [facility]) 20 MG/5ML solution Take 60 mg by mouth at bedtime.  11/27/16  Yes [provider]  lamoTRIgine 200 MG TBDP Dissolve 200 mg in the mouth two times a day- morning and bedtime 10/29/17  Yes Kathrynn Ducking, MD  levETIRAcetam (KEPPRA) 100 MG/ML solution Take 7 mLs (700 mg total) by mouth 2 (two) times daily. 01/03/17  Yes Kathrynn Ducking, MD  mycophenolate (CELLCEPT) 500 MG tablet Take 1,000 mg by mouth 2 (two) times daily.  03/20/17  Yes [provider]  QUEtiapine (SEROQUEL) 100 MG tablet Take 100 mg by mouth at bedtime.    Yes [provider]    ROS:  Out of a complete 14 system review of symptoms, the patient complains only of the following symptoms, and all other reviewed systems are negative.  Sleep apnea Joint pain, joint swelling Skin wounds, rash Seizures Depression, anxiety  Blood pressure (!) 141/91, pulse (!) 104, height 6\' 1"  (1.854 m), weight 253 lb (114.8 kg).  Physical Exam  General:  The patient is alert and cooperative at the time of the examination.  The patient is moderately obese.  Skin: No significant peripheral edema is noted.  Small round erythematous skin lesions are noted on the right elbow.   Neurologic Exam  Mental status: The patient is alert and oriented x 3 at the time of the examination. The patient has apparent normal recent and remote memory, with an apparently normal attention span and concentration ability.   Cranial nerves: Facial symmetry is present. Speech is normal, no aphasia or dysarthria is noted. Extraocular movements are full. Visual fields are full.  Motor: The patient has good strength in all 4 extremities.  Sensory examination: Soft touch sensation is symmetric  on the face, arms, and legs.  Coordination: The patient has good finger-nose-finger and heel-to-shin bilaterally.  Gait and station: The patient has a normal gait. Tandem gait is normal. Romberg is negative. No drift is seen.  Reflexes: Deep tendon reflexes are symmetric.   Assessment/Plan:  1.  Behcet's disease  2.  Seizures  The patient will remain on his current dose of Lamictal and Keppra, he will follow-up in 6 months.  He is not operating a motor vehicle currently.  He appears to be stable with a seizures currently.  I have recommended a low-grade exercise program of walking combined with a low carbohydrate diet.  Jill Alexanders MD 11/14/2017 9:03 AM  [facility] [address] [place], [place] [address]  Phone [phone] Fax [phone]

## 2017-12-25 ENCOUNTER — Encounter (HOSPITAL_COMMUNITY): Payer: Self-pay | Admitting: Emergency Medicine

## 2017-12-25 ENCOUNTER — Emergency Department (HOSPITAL_COMMUNITY): Payer: Medicare Other

## 2017-12-25 ENCOUNTER — Inpatient Hospital Stay (HOSPITAL_COMMUNITY)
Admission: EM | Admit: 2017-12-25 | Discharge: 2017-12-28 | DRG: 070 | Disposition: A | Discharge status: Home / Self Care | Payer: Medicare Other | Attending: Internal Medicine | Admitting: Internal Medicine

## 2017-12-25 DIAGNOSIS — I469 Cardiac arrest, cause unspecified: Secondary | ICD-10-CM | POA: Diagnosis present

## 2017-12-25 DIAGNOSIS — I10 Essential (primary) hypertension: Secondary | ICD-10-CM | POA: Diagnosis present

## 2017-12-25 DIAGNOSIS — R1310 Dysphagia, unspecified: Secondary | ICD-10-CM | POA: Diagnosis present

## 2017-12-25 DIAGNOSIS — G4733 Obstructive sleep apnea (adult) (pediatric): Secondary | ICD-10-CM | POA: Diagnosis present

## 2017-12-25 DIAGNOSIS — F319 Bipolar disorder, unspecified: Secondary | ICD-10-CM | POA: Diagnosis present

## 2017-12-25 DIAGNOSIS — M352 Behcet's disease: Secondary | ICD-10-CM | POA: Diagnosis present

## 2017-12-25 DIAGNOSIS — I2510 Atherosclerotic heart disease of native coronary artery without angina pectoris: Secondary | ICD-10-CM | POA: Diagnosis present

## 2017-12-25 DIAGNOSIS — G9341 Metabolic encephalopathy: Secondary | ICD-10-CM | POA: Diagnosis present

## 2017-12-25 DIAGNOSIS — E559 Vitamin D deficiency, unspecified: Secondary | ICD-10-CM | POA: Diagnosis present

## 2017-12-25 DIAGNOSIS — Z789 Other specified health status: Secondary | ICD-10-CM

## 2017-12-25 DIAGNOSIS — Z6833 Body mass index (BMI) 33.0-33.9, adult: Secondary | ICD-10-CM | POA: Diagnosis not present

## 2017-12-25 DIAGNOSIS — I700 Atherosclerosis of aorta: Secondary | ICD-10-CM | POA: Diagnosis present

## 2017-12-25 DIAGNOSIS — E669 Obesity, unspecified: Secondary | ICD-10-CM | POA: Diagnosis present

## 2017-12-25 DIAGNOSIS — J9811 Atelectasis: Secondary | ICD-10-CM | POA: Diagnosis present

## 2017-12-25 DIAGNOSIS — G40909 Epilepsy, unspecified, not intractable, without status epilepticus: Secondary | ICD-10-CM | POA: Diagnosis present

## 2017-12-25 DIAGNOSIS — R40243 Glasgow coma scale score 3-8, unspecified time: Secondary | ICD-10-CM | POA: Diagnosis present

## 2017-12-25 DIAGNOSIS — Z801 Family history of malignant neoplasm of trachea, bronchus and lung: Secondary | ICD-10-CM

## 2017-12-25 DIAGNOSIS — R079 Chest pain, unspecified: Secondary | ICD-10-CM | POA: Diagnosis not present

## 2017-12-25 DIAGNOSIS — E872 Acidosis: Secondary | ICD-10-CM | POA: Diagnosis present

## 2017-12-25 DIAGNOSIS — E785 Hyperlipidemia, unspecified: Secondary | ICD-10-CM | POA: Diagnosis present

## 2017-12-25 DIAGNOSIS — J9690 Respiratory failure, unspecified, unspecified whether with hypoxia or hypercapnia: Secondary | ICD-10-CM

## 2017-12-25 DIAGNOSIS — J9601 Acute respiratory failure with hypoxia: Secondary | ICD-10-CM | POA: Diagnosis present

## 2017-12-25 DIAGNOSIS — R569 Unspecified convulsions: Secondary | ICD-10-CM | POA: Diagnosis not present

## 2017-12-25 DIAGNOSIS — I5030 Unspecified diastolic (congestive) heart failure: Secondary | ICD-10-CM | POA: Diagnosis not present

## 2017-12-25 DIAGNOSIS — K219 Gastro-esophageal reflux disease without esophagitis: Secondary | ICD-10-CM | POA: Diagnosis present

## 2017-12-25 DIAGNOSIS — F419 Anxiety disorder, unspecified: Secondary | ICD-10-CM | POA: Diagnosis present

## 2017-12-25 DIAGNOSIS — R109 Unspecified abdominal pain: Secondary | ICD-10-CM | POA: Diagnosis not present

## 2017-12-25 DIAGNOSIS — J9600 Acute respiratory failure, unspecified whether with hypoxia or hypercapnia: Secondary | ICD-10-CM | POA: Diagnosis not present

## 2017-12-25 HISTORY — DX: Cardiac arrest, cause unspecified: I46.9

## 2017-12-25 LAB — PROTIME-INR
INR: 1
PROTHROMBIN TIME: 13.1 s (ref 11.4–15.2)

## 2017-12-25 LAB — COMPREHENSIVE METABOLIC PANEL
ALT: 32 U/L (ref 0–44)
ANION GAP: 11 (ref 5–15)
AST: 28 U/L (ref 15–41)
Albumin: 4.2 g/dL (ref 3.5–5.0)
Alkaline Phosphatase: 92 U/L (ref 38–126)
BUN: 14 mg/dL (ref 6–20)
CO2: 24 mmol/L (ref 22–32)
Calcium: 9.4 mg/dL (ref 8.9–10.3)
Chloride: 104 mmol/L (ref 98–111)
Creatinine, Ser: 1.2 mg/dL (ref 0.61–1.24)
GFR calc Af Amer: >60 mL/min (ref >60)
Glucose, Bld: 132 mg/dL — ABNORMAL HIGH (ref 70–99)
POTASSIUM: 4.3 mmol/L (ref 3.5–5.1)
Sodium: 139 mmol/L (ref 135–145)
Total Bilirubin: 0.5 mg/dL (ref 0.3–1.2)
Total Protein: 7.2 g/dL (ref 6.5–8.1)

## 2017-12-25 LAB — I-STAT ARTERIAL BLOOD GAS, ED
ACID-BASE DEFICIT: 3 mmol/L — AB (ref 0.0–2.0)
Bicarbonate: 22.8 mmol/L (ref 20.0–28.0)
O2 Saturation: 100 %
PH ART: 7.359 (ref 7.350–7.450)
TCO2: 24 mmol/L (ref 22–32)
pCO2 arterial: 40.4 mmHg (ref 32.0–48.0)
pO2, Arterial: 403 mmHg — ABNORMAL HIGH (ref 83.0–108.0)

## 2017-12-25 LAB — CBC
HCT: 48.8 % (ref 39.0–52.0)
Hemoglobin: 15.6 g/dL (ref 13.0–17.0)
MCH: 30.7 pg (ref 26.0–34.0)
MCHC: 32 g/dL (ref 30.0–36.0)
MCV: 96.1 fL (ref 78.0–100.0)
Platelets: 173 10*3/uL (ref 150–400)
RBC: 5.08 MIL/uL (ref 4.22–5.81)
RDW: 11.6 % (ref 11.5–15.5)
WBC: 7 10*3/uL (ref 4.0–10.5)

## 2017-12-25 LAB — I-STAT TROPONIN, ED: TROPONIN I, POC: 0 ng/mL (ref 0.00–0.08)

## 2017-12-25 LAB — GLUCOSE, CAPILLARY
GLUCOSE-CAPILLARY: 101 mg/dL — AB (ref 70–99)
Glucose-Capillary: 44 mg/dL — CL (ref 70–99)

## 2017-12-25 LAB — I-STAT CG4 LACTIC ACID, ED: Lactic Acid, Venous: 4.24 mmol/L (ref 0.5–1.9)

## 2017-12-25 LAB — APTT: aPTT: 29 seconds (ref 24–36)

## 2017-12-25 MED ORDER — PROPOFOL 1000 MG/100ML IV EMUL
INTRAVENOUS | Status: AC
  Filled 2017-12-25: qty 100

## 2017-12-25 MED ORDER — QUETIAPINE FUMARATE 200 MG PO TABS
200.0000 mg | ORAL_TABLET | Freq: Every day | ORAL | Status: DC
  Administered 2017-12-25: 200 mg
  Filled 2017-12-25: qty 1

## 2017-12-25 MED ORDER — SODIUM CHLORIDE 0.9 % IV SOLN
INTRAVENOUS | Status: DC
  Administered 2017-12-25 – 2017-12-27 (×7): via INTRAVENOUS

## 2017-12-25 MED ORDER — SODIUM CHLORIDE 0.9 % IV SOLN: 250.0000 mL | INTRAVENOUS | Status: DC | PRN

## 2017-12-25 MED ORDER — FLUOXETINE HCL 20 MG/5ML PO SOLN
60.0000 mg | Freq: Every day | ORAL | Status: DC
  Administered 2017-12-25: 60 mg
  Filled 2017-12-25 (×2): qty 15

## 2017-12-25 MED ORDER — SODIUM CHLORIDE 0.9 % IV SOLN
INTRAVENOUS | Status: DC | PRN
  Administered 2017-12-25: 1000 mL via INTRAVENOUS

## 2017-12-25 MED ORDER — FENTANYL CITRATE (PF) 100 MCG/2ML IJ SOLN
50.0000 ug | Freq: Once | INTRAMUSCULAR | Status: AC
  Administered 2017-12-25: 50 ug via INTRAVENOUS

## 2017-12-25 MED ORDER — PROPOFOL 1000 MG/100ML IV EMUL
INTRAVENOUS | Status: DC | PRN
  Administered 2017-12-25: 20 ug/kg/min via INTRAVENOUS

## 2017-12-25 MED ORDER — MIDAZOLAM HCL 2 MG/2ML IJ SOLN: 1.0000 mg | INTRAMUSCULAR | Status: DC | PRN

## 2017-12-25 MED ORDER — FENTANYL CITRATE (PF) 100 MCG/2ML IJ SOLN: 50.0000 ug | Freq: Once | INTRAMUSCULAR | Status: DC

## 2017-12-25 MED ORDER — DEXTROSE 50 % IV SOLN
INTRAVENOUS | Status: AC
  Administered 2017-12-25: 50 mL
  Filled 2017-12-25: qty 50

## 2017-12-25 MED ORDER — HEPARIN SODIUM (PORCINE) 5000 UNIT/ML IJ SOLN
5000.0000 [IU] | Freq: Three times a day (TID) | INTRAMUSCULAR | Status: DC
  Administered 2017-12-25 – 2017-12-28 (×8): 5000 [IU] via SUBCUTANEOUS
  Filled 2017-12-25 (×8): qty 1

## 2017-12-25 MED ORDER — IOPAMIDOL (ISOVUE-370) INJECTION 76%
INTRAVENOUS | Status: AC
  Filled 2017-12-25: qty 100

## 2017-12-25 MED ORDER — FENTANYL CITRATE (PF) 100 MCG/2ML IJ SOLN
INTRAMUSCULAR | Status: AC
  Filled 2017-12-25: qty 2

## 2017-12-25 MED ORDER — LORAZEPAM 2 MG/ML IJ SOLN: 2.0000 mg | INTRAMUSCULAR | Status: DC | PRN

## 2017-12-25 MED ORDER — FENTANYL CITRATE (PF) 100 MCG/2ML IJ SOLN
INTRAMUSCULAR | Status: DC | PRN
  Administered 2017-12-25: 50 ug via INTRAVENOUS

## 2017-12-25 MED ORDER — ETOMIDATE 2 MG/ML IV SOLN
INTRAVENOUS | Status: DC | PRN
  Administered 2017-12-25: 25 mg via INTRAVENOUS

## 2017-12-25 MED ORDER — FENTANYL 2500MCG IN NS 250ML (10MCG/ML) PREMIX INFUSION
25.0000 ug/h | INTRAVENOUS | Status: DC
  Administered 2017-12-25: 100 ug/h via INTRAVENOUS
  Administered 2017-12-26 (×2): 300 ug/h via INTRAVENOUS
  Filled 2017-12-25 (×2): qty 250

## 2017-12-25 MED ORDER — IOPAMIDOL (ISOVUE-370) INJECTION 76%
100.0000 mL | Freq: Once | INTRAVENOUS | Status: AC | PRN
  Administered 2017-12-25: 100 mL via INTRAVENOUS

## 2017-12-25 MED ORDER — LAMOTRIGINE 100 MG PO TABS
200.0000 mg | ORAL_TABLET | Freq: Two times a day (BID) | ORAL | Status: DC
  Administered 2017-12-25 – 2017-12-26 (×2): 200 mg
  Filled 2017-12-25 (×2): qty 2

## 2017-12-25 MED ORDER — SUCCINYLCHOLINE CHLORIDE 20 MG/ML IJ SOLN
INTRAMUSCULAR | Status: DC | PRN
  Administered 2017-12-25: 120 mg via INTRAVENOUS

## 2017-12-25 MED ORDER — FAMOTIDINE IN NACL 20-0.9 MG/50ML-% IV SOLN
20.0000 mg | Freq: Two times a day (BID) | INTRAVENOUS | Status: DC
  Administered 2017-12-25 – 2017-12-26 (×3): 20 mg via INTRAVENOUS
  Filled 2017-12-25 (×3): qty 50

## 2017-12-25 MED ORDER — PROPOFOL 1000 MG/100ML IV EMUL
0.0000 ug/kg/min | INTRAVENOUS | Status: DC
  Administered 2017-12-25: 50 ug/kg/min via INTRAVENOUS
  Administered 2017-12-25: 16:00:00 via INTRAVENOUS
  Administered 2017-12-26: 50 ug/kg/min via INTRAVENOUS
  Filled 2017-12-25 (×4): qty 100

## 2017-12-25 MED ORDER — DEXTROSE 50 % IV SOLN: 1.0000 | INTRAVENOUS | Status: DC | PRN

## 2017-12-25 MED ORDER — ASPIRIN 300 MG RE SUPP: 300.0000 mg | RECTAL | Status: AC

## 2017-12-25 MED ORDER — FENTANYL BOLUS VIA INFUSION
50.0000 ug | INTRAVENOUS | Status: DC | PRN
  Filled 2017-12-25: qty 50

## 2017-12-25 MED ORDER — LEVETIRACETAM 100 MG/ML PO SOLN
700.0000 mg | Freq: Two times a day (BID) | ORAL | Status: DC
  Administered 2017-12-25 – 2017-12-26 (×2): 700 mg
  Filled 2017-12-25 (×3): qty 7.5

## 2017-12-25 NOTE — ED Provider Notes (Signed)
[facility] EMERGENCY DEPARTMENT Provider Note   CSN: [id number] Arrival date & time: 12/25/17  1600     History   Chief Complaint No chief complaint on file.   HPI [name] [name] is a 37 y.o. male.  HPI   37 year old male with PMH significant for Behcet's disease, epilepsy, bipolar disorder who presents status post presumed cardiac arrest.  Patient was swimming when he was seen to emerge in the pool and clutch his chest.  Patient became unresponsive.  On duty lifeguards ran to his aide found him pulseless and initiated CPR.  One round of CPR performed with ROSC.  No AED was placed.  EMS arrived shortly thereafter and found patient pulsatile yet GCS 3.  EMS began bagging and transferred [facility] to [name] for [facility] evaluation.  Patient noncontributory to HPI.  Past Medical History:  Diagnosis Date  . Behcet's disease ([place])   . Bipolar disorder ([place])   . Hypertension   . Migraine   . Seizures ([place])    last experienced a seizure x 3 years ago  . Vitamin D deficiency     Patient Active Problem List   Diagnosis Date Noted  . Lactic acidosis   . Acute respiratory failure with hypoxia ([place])   . Seizures ([place])   . Post-ictal coma ([place]) 08/03/2016  . Acute respiratory failure with hypoxemia ([place])   . Seizure ([place])   . Altered mental status 07/07/2016  . Peripheral vertigo 07/17/2015  . Vertigo 07/17/2015  . Intractable vomiting 07/17/2015  . Diplopia   . Cellulitis and abscess of trunk-right chest wall 05/08/2013  . Dehydration 02/15/2013  . Behcet's disease ([place]) 02/15/2013  . Recurrent vomiting 02/15/2013  . Encounter for therapeutic drug monitoring 06/14/2012  . History of oral aphthous ulcers 10/19/2010  . BIPOLAR DISORDER UNSPECIFIED 06/18/2008  . Essential hypertension 06/18/2008  . SYNCOPE AND COLLAPSE 06/18/2008  . Epilepsy ([place]) 06/18/2008    Past Surgical History:  Procedure Laterality Date  . APPENDECTOMY    . FRACTURE SURGERY Right     Arm  . maxilofacial    . NISSEN FUNDOPLICATION          Home Medications    Prior to Admission medications   Medication Sig Start Date End Date Taking? Authorizing Provider  QUEtiapine (SEROQUEL) 200 MG tablet Take 200 mg by mouth at bedtime.   Yes [provider]  Apremilast 30 MG TABS Take 30 mg by mouth 2 (two) times daily.  11/07/17   [provider]  clonazePAM (KLONOPIN) 0.5 MG tablet Take 1 tablet (0.5 mg total) by mouth 3 (three) times daily. For anxiety Patient taking differently: Take 0.25 mg by mouth 3 (three) times daily.  02/16/13   Robbie Lis, MD  FLUoxetine (PROZAC) 20 MG/5ML solution Take 60 mg by mouth at bedtime.  11/27/16   [provider]  lamoTRIgine 200 MG TBDP Dissolve 200 mg in the mouth two times a day- morning and bedtime 10/29/17   Kathrynn Ducking, MD  levETIRAcetam (KEPPRA) 100 MG/ML solution Take 7 mLs (700 mg total) by mouth 2 (two) times daily. 01/03/17   Kathrynn Ducking, MD  mycophenolate (CELLCEPT) 500 MG tablet Take 1,000 mg by mouth 2 (two) times daily.  03/20/17   [provider]    Family History Family History  Problem Relation Age of Onset  . Lung cancer Paternal Grandfather   . Other Father        BPPV  Social History Social History   Tobacco Use  . Smoking status: Never Smoker  . Smokeless tobacco: Never Used  Substance Use Topics  . Alcohol use: No  . Drug use: No     Allergies   Patient has no known allergies.   Review of Systems Review of Systems  Unable to perform ROS: Patient unresponsive     Physical Exam Updated Vital Signs BP 135/89   Pulse 83   Temp (!) 96.1 F (35.6 C) (Temporal)   Resp 16   Ht 6' (1.829 m)   Wt 120.2 kg   SpO2 100%   BMI 35.94 kg/m   Physical Exam  Constitutional: He appears well-developed and well-nourished.  HENT:  Head: Normocephalic and atraumatic.  C collar in place  Eyes: Conjunctivae are normal.  Neck: Neck supple.    Cardiovascular: Normal rate and regular rhythm.  No murmur heard. Pulmonary/Chest: Effort normal and breath sounds normal. No respiratory distress.  Abdominal: Soft. There is no tenderness.  Musculoskeletal: He exhibits no edema.  Neurological: He is unresponsive. GCS eye subscore is 1. GCS verbal subscore is 1. GCS motor subscore is 1.  Skin: Skin is warm and dry. Capillary refill takes 2 to 3 seconds.  Psychiatric: He has a normal mood and affect.  Nursing note and vitals reviewed.    ED Treatments / Results  Labs (all labs ordered are listed, but only abnormal results are displayed) Labs Reviewed  COMPREHENSIVE METABOLIC PANEL - Abnormal; Notable for the following components:      Result Value   Glucose, Bld 132 (*)    All other components within normal limits  I-STAT CG4 LACTIC ACID, ED - Abnormal; Notable for the following components:   Lactic Acid, Venous 4.24 (*)    All other components within normal limits  I-STAT ARTERIAL BLOOD GAS, ED - Abnormal; Notable for the following components:   pO2, Arterial 403.0 (*)    Acid-base deficit 3.0 (*)    All other components within normal limits  CBC  PROTIME-INR  APTT  I-STAT TROPONIN, ED  CBG MONITORING, ED    EKG EKG Interpretation  Date/Time:  Tuesday December 25 2017 16:04:19 EDT Ventricular Rate:  96 PR Interval:    QRS Duration: 98 QT Interval:  350 QTC Calculation: 443 R Axis:   52 Text Interpretation:  Age not entered, assumed to be  37 years old for purpose of ECG interpretation Sinus rhythm Probable inferior infarct, age indeterminate Lateral leads are also involved No significant change since last tracing Confirmed by Isla Pence [phone]) on 12/25/2017 4:26:18 PM   Radiology Ct Head Wo Contrast  Result Date: 12/25/2017 CLINICAL DATA:  Witnessed cardiac arrest. History of seizures. Altered level of consciousness. EXAM: CT HEAD WITHOUT CONTRAST CT CERVICAL SPINE WITHOUT CONTRAST TECHNIQUE: Multidetector CT  imaging of the head and cervical spine was performed following the standard protocol without intravenous contrast. Multiplanar CT image reconstructions of the cervical spine were also generated. COMPARISON:  CT scan of Aug 03, 2016. FINDINGS: CT HEAD FINDINGS Brain: No evidence of acute infarction, hemorrhage, hydrocephalus, extra-axial collection or mass lesion/mass effect. Vascular: No hyperdense vessel or unexpected calcification. Skull: Normal. Negative for fracture or focal lesion. Sinuses/Orbits: Mild left maxillary mucosal thickening is noted. Other: None. CT CERVICAL SPINE FINDINGS Alignment: Normal. Skull base and vertebrae: No acute fracture. No primary bone lesion or focal pathologic process. Soft tissues and spinal canal: No prevertebral fluid or swelling. No visible canal hematoma. Disc levels:  Normal. Upper chest:  Negative. Other: None. IMPRESSION: No acute intracranial abnormality seen. Normal cervical spine. Electronically Signed   By: Marijo Conception, M.D.   On: 12/25/2017 18:02   Ct Cervical Spine Wo Contrast  Result Date: 12/25/2017 CLINICAL DATA:  Witnessed cardiac arrest. History of seizures. Altered level of consciousness. EXAM: CT HEAD WITHOUT CONTRAST CT CERVICAL SPINE WITHOUT CONTRAST TECHNIQUE: Multidetector CT imaging of the head and cervical spine was performed following the standard protocol without intravenous contrast. Multiplanar CT image reconstructions of the cervical spine were also generated. COMPARISON:  CT scan of Aug 03, 2016. FINDINGS: CT HEAD FINDINGS Brain: No evidence of acute infarction, hemorrhage, hydrocephalus, extra-axial collection or mass lesion/mass effect. Vascular: No hyperdense vessel or unexpected calcification. Skull: Normal. Negative for fracture or focal lesion. Sinuses/Orbits: Mild left maxillary mucosal thickening is noted. Other: None. CT CERVICAL SPINE FINDINGS Alignment: Normal. Skull base and vertebrae: No acute fracture. No primary bone lesion or  focal pathologic process. Soft tissues and spinal canal: No prevertebral fluid or swelling. No visible canal hematoma. Disc levels:  Normal. Upper chest: Negative. Other: None. IMPRESSION: No acute intracranial abnormality seen. Normal cervical spine. Electronically Signed   By: Marijo Conception, M.D.   On: 12/25/2017 18:02   Dg Chest Port 1 View  Result Date: 12/25/2017 CLINICAL DATA:  Respiratory failure, status post intubation. EXAM: PORTABLE CHEST 1 VIEW COMPARISON:  09/06/2017 FINDINGS: The tip of an endotracheal tube terminates approximately 5 cm above the carina in satisfactory position. A gastric tube extends below the left hemidiaphragm into the expected location of the stomach. The tip and side port are excluded on this exam. Stable cardiomegaly with aortic atherosclerosis is noted. No pulmonary consolidation nor overt pulmonary edema. No effusion. No acute osseous abnormality. IMPRESSION: Satisfactory endotracheal tube position terminating 5 cm above the carina. A gastric tube is seen extending below the left hemidiaphragm presumably into the expected location of the stomach. The tip and side port are excluded. No active pulmonary disease.  Aortic atherosclerosis. Electronically Signed   By: Ashley Royalty M.D.   On: 12/25/2017 16:28   Ct Angio Chest/abd/pel For Dissection W And/or Wo Contrast  Result Date: 12/25/2017 CLINICAL DATA:  Witnessed cardiac arrest, patient clutched chest and went down to ground, was pulseless, CPR initiated, history hypertension EXAM: CT ANGIOGRAPHY CHEST, ABDOMEN AND PELVIS TECHNIQUE: Multidetector CT imaging through the chest, abdomen and pelvis was performed using the standard protocol during bolus administration of intravenous contrast. Multiplanar reconstructed images and MIPs were obtained and reviewed to evaluate the vascular anatomy. CONTRAST:  100 ML ISOVUE-370 IOPAMIDOL (ISOVUE-370) INJECTION 76% IV COMPARISON:  02/02/2003 FINDINGS: CTA CHEST FINDINGS  Cardiovascular: Aorta normal caliber. No calcified atherosclerotic plaque identified. No intramural hematoma on precontrast imaging. Normal aortic enhancement post contrast without evidence of aneurysm or dissection. Pulmonary arteries grossly patent on non targeted exam. No evidence of pulmonary embolism. No pericardial effusion. Mediastinum/Nodes: Nasogastric tube traverses esophagus into stomach. Esophagus normal appearance. Base of cervical region unremarkable. No thoracic adenopathy. Lungs/Pleura: Dependent atelectasis in the posterior lungs bilaterally. No pulmonary infiltrate, pleural effusion or pneumothorax. Musculoskeletal: No definite fractures. Review of the MIP images confirms the above findings. CTA ABDOMEN AND PELVIS FINDINGS VASCULAR Aorta: Aorta patent and normal caliber.  No aneurysm or dissection. Celiac: Widely patent SMA: Widely patent Renals: Widely patent IMA: Widely patent Inflow: Widely patent normal appearing iliac systems bilaterally Veins: Unopacified and suboptimally assessed on CTA imaging Review of the MIP images confirms the above findings. NON-VASCULAR Hepatobiliary: Streak artifacts from  patient's arms traverse upper abdomen. Gallbladder and liver otherwise normal appearance. Pancreas: Normal appearance Spleen: Normal appearance Adrenals/Urinary Tract: Adrenal glands, kidneys, ureters, and bladder normal appearance Stomach/Bowel: Appendix surgically absent. Tip of nasogastric tube at distal gastric antrum. Stomach and bowel loops unremarkable for technique. Lymphatic: No adenopathy Reproductive: Unremarkable prostate gland Other: No free air or free fluid.  No hernia. Musculoskeletal: Normal appearance Review of the MIP images confirms the above findings. IMPRESSION: No evidence of aortic aneurysm or dissection. Pulmonary arteries grossly patent. Dependent atelectasis in the posterior lungs bilaterally. No no additional intrathoracic, intra-abdominal or intrapelvic abnormalities.  Electronically Signed   By: Lavonia Dana M.D.   On: 12/25/2017 18:08    Procedures Procedures (including critical care time)  Medications Ordered in ED Medications  etomidate (AMIDATE) injection (25 mg Intravenous Given 12/25/17 1605)  succinylcholine (ANECTINE) injection (120 mg Intravenous Given 12/25/17 1606)  0.9 %  sodium chloride infusion (1,000 mLs Intravenous New Bag/Given 12/25/17 1606)  fentaNYL (SUBLIMAZE) injection ( Intravenous Canceled Entry 12/25/17 1630)  aspirin suppository 300 mg (has no administration in time range)  propofol (DIPRIVAN) 1000 MG/100ML infusion (50 mcg/kg/min  100 kg Intravenous New Bag/Given 12/25/17 1736)  0.9 %  sodium chloride infusion ( Intravenous New Bag/Given 12/25/17 1734)  fentaNYL (SUBLIMAZE) injection 50 mcg (has no administration in time range)  fentaNYL 2579mcg in NS 268mL (66mcg/ml) infusion-PREMIX (100 mcg/hr Intravenous Incomplete 12/25/17 1832)  fentaNYL (SUBLIMAZE) bolus via infusion 50 mcg (has no administration in time range)  propofol (DIPRIVAN) 1000 MG/100ML infusion (  Stopped 12/25/17 1737)  iopamidol (ISOVUE-370) 76 % injection 100 mL (100 mLs Intravenous Contrast Given 12/25/17 1728)  fentaNYL (SUBLIMAZE) injection 50 mcg (50 mcg Intravenous Given 12/25/17 1813)     Initial Impression / Assessment and Plan / ED Course  I have reviewed the triage vital signs and the nursing notes.  Pertinent labs & imaging results that were available during my care of the patient were reviewed by me and considered in my medical decision making (see chart for details).     37 year old male with PMH significant for Behcet's disease, epilepsy, bipolar disorder who presents status post presumed cardiac arrest.  History as above.  Patient arrived GCS 3, not protecting airway, easily bagged by EMS.  Patient with pulse and initial blood pressure 138/94.  Patient in c-collar.  Patient intubated as above for protection of airway.  Patient sedated with  propofol.  Postarrest labs drawn.  Due to patient's recent swelling, unsure if head or neck injury was sustained leading to event.  Post cooling measures were not initially taken.  Labs and imaging performed.  CXR notable for widened mediastinum notably increased from priors 3 months ago.  CT head neck and CT chest and pelvis dissection study ordered revealing no acute abnormalities.  EKG shows tachycardia of 96, regular rhythm, normal axis, normal intervals, no signs of ST elevation or ST deviation, no delta, epsilon or U waves.  No signs of T wave inversions or peak T waves.  Mild Q waves noted in lead III, possible distant remote infarct.  Normal EKG.  EKG consistent with priors on chart review.  Patient admitted to intensivist for further management of care.  Final Clinical Impressions(s) / ED Diagnoses   Final diagnoses:  Intubation of airway performed without difficulty  Cardiac arrest [facility])    ED Discharge Orders    None       Keenan Bachelor, MD 12/25/17 Si Raider    Isla Pence, MD 12/28/17 1511

## 2017-12-25 NOTE — ED Triage Notes (Signed)
Pt arrived [facility] from the [facility] pool. Pt had a witnessed arrest, he got out of the pool, clutched his chest and went down to the ground. Life guard on duty stated pt was pulseless and initiated CPR, when EMS arrived Pulses were present. EMS called at 1508. Per EMS pt has a hx of seizures. EMS using BVM for respirations on arrival NPA R nare. Initially hypoglycemic at 48, given 25G D10 last CBG 128, 20GLAC BP 120/76 P99 RR 10 O2 100% capnography 30.

## 2017-12-25 NOTE — Progress Notes (Signed)
Patient transported to [facility]  ICU room 5 on ventilator with no events to report.  Report given to [facility] RRT covering 71M ICU.

## 2017-12-25 NOTE — H&P (Signed)
NAME:  [name] [name], MRN:  [id number], DOB:  10/21/1980, LOS: 0 ADMISSION DATE:  12/25/2017, CONSULTATION DATE:  12/25/17 REFERRING MD:  Gilford Raid, EDP CHIEF COMPLAINT:  Cardiac Arrest   Brief History   [name] [name] is a 37 y.o. male who was brought to [facility] ED 9/24 after presumed brief cardiac arrest at the [facility] while he was swimming (came out of pool, clamped chest, unresponsive). ROSC after 1 - 2 minutes; therefore, questionable whether truly lost pulses or not. In ED, he had GCS of 3 therefore was intubated.  Significant Hospital Events   9/24 > admit.  Consults: date of consult/date signed off & final recs:  Cardiology 9/24   Procedures (surgical and bedside):  ETT 9/24 >   Significant Diagnostic Tests:  CT head & C-spine 9/24 > negative. CTA chest / abd / pelv 9/24 > negative. Echo 9/25 >   Micro Data: Blood 9/24 >  Sputum 9/24 >  Urine 9/24 >   Antimicrobials:  None.   Subjective:  Opens eyes to voice and follows basic commands.  Objective   Blood pressure 126/82, pulse 84, temperature 97.7 F (36.5 C), resp. rate 16, height 6' (1.829 m), weight 120.2 kg, SpO2 100 %.    Vent Mode: PRVC FiO2 (%):  [40 %-100 %] 40 % Set Rate:  [16 bmp] 16 bmp Vt Set:  [620 mL] 620 mL PEEP:  [5 cmH20] 5 cmH20 Plateau Pressure:  [10 cmH20] 10 cmH20   Intake/Output Summary (Last 24 hours) at 12/25/2017 2009 Last data filed at 12/25/2017 1921 Gross per 24 hour  Intake 90.57 ml  Output 750 ml  Net -659.43 ml   Filed Weights   12/25/17 1625 12/25/17 1824  Weight: 100 kg 120.2 kg    Examination: General: Young adult male, resting in bed, in NAD. Neuro: Sedated but opens eyes to voice and follows basic commands. HEENT: [place]/AT. Sclerae anicteric, EOMI once aroused. Cardiovascular: RRR, no M/R/G.  Lungs: Respirations even and unlabored.  CTA bilaterally, No W/R/R. Abdomen: BS x 4, soft, NT/ND.  Musculoskeletal: No gross deformities, no edema.  Skin: Intact, warm, no  rashes.  Assessment & Plan:   ? Cardiac arrest - unclear etiology; however, not clear whether pt truly did lose pulses or not.  Per notes, was swimming at the [facility] then got out of the pool and clamped his chest before collapsing.  Lifeguard did not appreciate pulses; therefore, began CPR and ROSC was achieved after just 1 round.  Labs, EKG, imaging all normal.  UDS pending. Plan: Continue supportive care. No TTM as pt is following commands. Cardiology consulted. Trend troponin. F/u on UDS. Assess echo for completeness.  Respiratory insufficiency - due to above. Plan: Continue full vent support tonight. SBT in AM for hopeful extubation. Bronchial hygiene. Follow CXR.  Sedation needs due to mechanical ventilation. Plan: Sedation: Propofol gtt / Fentanyl gtt / midazolam PRN. RASS goal: -1. Daily WUA.  Elevated lactic acid - ? Due to CPR. Plan: Repeat lactate.  Hx seizures - no seizure like activity reported by EMS. Plan: Continue preadmission lamotrigine, keppra. No role EEG as pt following commands at this time.  Hx Behcet's. Plan: Assess ESR, CRP to evaluate for potential underlying vasculitis. Hold preadmission apremilast, mycophenolate.  Hx Bipolar disorder, anxiety, migraines. Plan: Continue preadmission seroquel, prozac. Hold preadmission klonopin.   Disposition / Summary of Today's Plan 12/25/17   ICU. Continue supportive care.  Cardiology consulted. Hopeful for extubation in AM.    Diet: NPO.  Pain/Anxiety/Delirium protocol (if indicated): As above. DVT prophylaxis: SCD's / Heparin. GI prophylaxis: Famotidine Hyperglycemia protocol: None. Mobility: Bedrest. Code Status: Full. Family Communication: Mother and father updated at bedside.  Labs   CBC: Recent Labs  Lab 12/25/17 1646  WBC 7.0  HGB 15.6  HCT 48.8  MCV 96.1  PLT 528   Basic Metabolic Panel: Recent Labs  Lab 12/25/17 1646  NA 139  K 4.3  CL 104  CO2 24  GLUCOSE 132*  BUN 14   CREATININE 1.20  CALCIUM 9.4   GFR: Estimated Creatinine Clearance: 110.6 mL/min (by C-G formula based on SCr of 1.2 mg/dL). Recent Labs  Lab 12/25/17 1646 12/25/17 1657  WBC 7.0  --   LATICACIDVEN  --  4.24*   Liver Function Tests: Recent Labs  Lab 12/25/17 1646  AST 28  ALT 32  ALKPHOS 92  BILITOT 0.5  PROT 7.2  ALBUMIN 4.2   No results for input(s): LIPASE, AMYLASE in the last 168 hours. No results for input(s): AMMONIA in the last 168 hours. ABG    Component Value Date/Time   PHART 7.359 12/25/2017 1731   PCO2ART 40.4 12/25/2017 1731   PO2ART 403.0 (H) 12/25/2017 1731   HCO3 22.8 12/25/2017 1731   TCO2 24 12/25/2017 1731   ACIDBASEDEF 3.0 (H) 12/25/2017 1731   O2SAT 100.0 12/25/2017 1731    Coagulation Profile: Recent Labs  Lab 12/25/17 1646  INR 1.00   Cardiac Enzymes: No results for input(s): CKTOTAL, CKMB, CKMBINDEX, TROPONINI in the last 168 hours. HbA1C: Hgb A1c MFr Bld  Date/Time Value Ref Range Status  07/08/2016 06:14 AM 5.4 4.8 - 5.6 % Final    Comment:    (NOTE)         Pre-diabetes: 5.7 - 6.4         Diabetes: >6.4         Glycemic control for adults with diabetes: <7.0    CBG: No results for input(s): GLUCAP in the last 168 hours.  Admitting History of Present Illness.   Pt is encephelopathic; therefore, this HPI is obtained from chart review. [name] [name] is a 37 y.o. male who  has a past medical history of Behcet's disease ([place]), Bipolar disorder ([place]), Hypertension, Migraine, Seizures ([place]), and Vitamin D deficiency.  He presented to [facility] ED on 9/24 after concern for cardiac arrest.  He was apparently swimming at the [facility] with his father when he got out of the pool, clamped his chest, then became unresponsive.  Lifeguard on the scene did not appreciate pulses; therefore, began CPR.  CPR was performed for just 1 round prior to ROSC.   He was then transported to the ED where he remained unresponsive.  He was subsequently intubated and  PCCM was called for admission.  At the time of our eval, pt is sedated; however, he does open eyes to voice and is able to follow basic commands.  He appears somewhat uncomfortable on 19mg/kg/min propofol and 1534m/hr fentanyl.  I have asked RN to increase his sedation for a goal RASS of -1.  Per his parents, he had been complaining of vague abdominal pain radiating into his back for the past 1 week or so. No nausea/vomiting/diarrhea.  Had been tolerating PO intake fine.  Denies any chest pain, dyspnea, lightheadedness, myalgias, syncope.  No known drug use.  Has seizure disorder but parents say this has been controlled recently and pt has not had any episodes.  [facility]  No  seizure like activity reported by EMS or RN in ED.  Review of Systems:   Unable to obtain as pt is encephalopathic.  Past medical history  He,  has a past medical history of Behcet's disease ([place]), Bipolar disorder ([place]), Hypertension, Migraine, Seizures ([place]), and Vitamin D deficiency.   Surgical History    Past Surgical History:  Procedure Laterality Date  . APPENDECTOMY    . FRACTURE SURGERY Right    Arm  . maxilofacial    . NISSEN FUNDOPLICATION       Social History   Social History   Socioeconomic History  . Marital status: Single    Spouse name: Not on file  . Number of children: 0  . Years of education: 74  . Highest education level: Not on file  Occupational History    Employer: DISABLED  Social Needs  . Financial resource strain: Not on file  . Food insecurity:    Worry: Not on file    Inability: Not on file  . Transportation needs:    Medical: Not on file    Non-medical: Not on file  Tobacco Use  . Smoking status: Never Smoker  . Smokeless tobacco: Never Used  Substance and Sexual Activity  . Alcohol use: No  . Drug use: No  . Sexual activity: Not on file  Lifestyle  . Physical activity:    Days per week: Not on file    Minutes per session: Not on file    . Stress: Not on file  Relationships  . Social connections:    Talks on phone: Not on file    Gets together: Not on file    Attends religious service: Not on file    Active member of club or organization: Not on file    Attends meetings of clubs or organizations: Not on file    Relationship status: Not on file  . Intimate partner violence:    Fear of current or ex partner: Not on file    Emotionally abused: Not on file    Physically abused: Not on file    Forced sexual activity: Not on file  Other Topics Concern  . Not on file  Social History Narrative  . Not on file  ,  reports that he has never smoked. He has never used smokeless tobacco. He reports that he does not drink alcohol or use drugs.   Family history   His family history includes Lung cancer in his paternal grandfather; Other in his father.   Allergies No Known Allergies  Home meds  Prior to Admission medications   Medication Sig Start Date End Date Taking? Authorizing Provider  QUEtiapine (SEROQUEL) 200 MG tablet Take 200 mg by mouth at bedtime.   Yes [provider]  Apremilast 30 MG TABS Take 30 mg by mouth 2 (two) times daily.  11/07/17   [provider]  clonazePAM (KLONOPIN) 0.5 MG tablet Take 1 tablet (0.5 mg total) by mouth 3 (three) times daily. For anxiety Patient taking differently: Take 0.25 mg by mouth 3 (three) times daily.  02/16/13   Robbie Lis, MD  FLUoxetine (PROZAC) 20 MG/5ML solution Take 60 mg by mouth at bedtime.  11/27/16   [provider]  lamoTRIgine 200 MG TBDP Dissolve 200 mg in the mouth two times a day- morning and bedtime 10/29/17   Kathrynn Ducking, MD  levETIRAcetam (KEPPRA) 100 MG/ML solution Take 7 mLs (700 mg total) by mouth 2 (two) times daily. 01/03/17  Kathrynn Ducking, MD  mycophenolate (CELLCEPT) 500 MG tablet Take 1,000 mg by mouth 2 (two) times daily.  03/20/17   [provider]    CC time: 35 min.   Montey Hora, [place]  Pulmonary & Critical Care Medicine Pager: [phone]  or [phone] 12/25/2017, 8:09 PM

## 2017-12-25 NOTE — Progress Notes (Signed)
Receive patient from ED.  Assessment done and ongoing.  CBG = 44.  D50 1 amp given.  CBG rechecked in 15 minutes.  CBG = 101.  Will continue to check CBG as needed.  MD to be notified

## 2017-12-25 NOTE — ED Notes (Signed)
Pt's mother is here in the room. She states that pt has been complaining of mid upper abd pain radiating through to the back for several days and has not been feeling well. Has been taking ibuprofen.

## 2017-12-25 NOTE — Consult Note (Signed)
[facility] Consult Note   Primary Physician:  Leighton Ruff Primary Cardiologist:   Landry Corporal  Reason for Consultation:   Cardiac arrest  HPI:    [name] [name] is a 37 year old gentleman with a past medical history significant for Behcet's disease (with skin, GI and ocular manifestations), depression, anxiety, bipolar disorder, hypertension, hyperlipidemia, obesity, hiatal hernia, migraine headaches, obstructive sleep apnea and gastritis who was brought to our ED after he had a witnessed arrest (?) in the field. The patient became unresponsive after climbing out of the pool at the local [employer]. He apparently clamped his chest and passed out. He received CPR from a duty lifeguard and achieved ROSC after 1 cycle. No AED was used. EMS found the patient with a pulse but unresponsive.  In the ED he was intubated for a GCS score of 3. A PE work-up was negative. The initial troponin was negative and the ECG did not reveal any ischemic changes. The QT interval was not prolonged.  On reviewing the patient's medical records it appears that he had a cardiac work-up in the past for off an on chest pain. His recent coronary CT did not reveal any significant stenoses. A prior Echo documented normal LV function and no significant valvular disease.   Previous cardiac work-up: CT Coronaries - 10/16/2017 Coronary Arteries: Right dominant with no anomalies LM: No plaque or stenosis. LAD system: Mixed plaque with mild stenosis (< 50%) in the proximal LAD. Circumflex system: Images degraded by misregistration artifact, but no significant disease. RCA system: No plaque or stenosis IMPRESSION: 1.  Mixed plaque with mild stenosis in the proximal LAD. 2. Small area of calcification proximal LAD but will not register on software for calcium scoring.  Myocardial perfusion study - 11/20/2017 IMPRESSION: Limited myocardial perfusion examination due to the limited stress level.  There are no fixed or  reversible defects on this examination. Ejection fraction measures 58%.   Echocardiogram - 11/20/2007 Overall left ventricular systolic function was normal. Left ventricular ejection fraction was estimated to be 55 %. There were no left ventricular regional wall motion abnormalities. The aortic valve was mildly calcified.    Home Medications Prior to Admission medications   Medication Sig Start Date End Date Taking? Authorizing Provider  QUEtiapine (SEROQUEL) 200 MG tablet Take 200 mg by mouth at bedtime.   Yes [provider]  Apremilast 30 MG TABS Take 30 mg by mouth 2 (two) times daily.  11/07/17   [provider]  clonazePAM (KLONOPIN) 0.5 MG tablet Take 1 tablet (0.5 mg total) by mouth 3 (three) times daily. For anxiety Patient taking differently: Take 0.25 mg by mouth 3 (three) times daily.  02/16/13   Robbie Lis, MD  FLUoxetine (PROZAC) 20 MG/5ML solution Take 60 mg by mouth at bedtime.  11/27/16   [provider]  lamoTRIgine 200 MG TBDP Dissolve 200 mg in the mouth two times a day- morning and bedtime 10/29/17   Kathrynn Ducking, MD  levETIRAcetam (KEPPRA) 100 MG/ML solution Take 7 mLs (700 mg total) by mouth 2 (two) times daily. 01/03/17   Kathrynn Ducking, MD  mycophenolate (CELLCEPT) 500 MG tablet Take 1,000 mg by mouth 2 (two) times daily.  03/20/17   [provider]    Past Medical History: Past Medical History:  Diagnosis Date  . Behcet's disease ([place])   . Bipolar disorder ([place])   . Hypertension   . Migraine   . Seizures ([place])  last experienced a seizure x 3 years ago  . Vitamin D deficiency     Past Surgical History: Past Surgical History:  Procedure Laterality Date  . APPENDECTOMY    . FRACTURE SURGERY Right    Arm  . maxilofacial    . NISSEN FUNDOPLICATION      Family History: Family History  Problem Relation Age of Onset  . Lung cancer Paternal Grandfather   . Other Father        BPPV    Social  History: Social History   Socioeconomic History  . Marital status: Single    Spouse name: Not on file  . Number of children: 0  . Years of education: 37  . Highest education level: Not on file  Occupational History    Employer: DISABLED  Social Needs  . Financial resource strain: Not on file  . Food insecurity:    Worry: Not on file    Inability: Not on file  . Transportation needs:    Medical: Not on file    Non-medical: Not on file  Tobacco Use  . Smoking status: Never Smoker  . Smokeless tobacco: Never Used  Substance and Sexual Activity  . Alcohol use: No  . Drug use: No  . Sexual activity: Not on file  Lifestyle  . Physical activity:    Days per week: Not on file    Minutes per session: Not on file  . Stress: Not on file  Relationships  . Social connections:    Talks on phone: Not on file    Gets together: Not on file    Attends religious service: Not on file    Active member of club or organization: Not on file    Attends meetings of clubs or organizations: Not on file    Relationship status: Not on file  Other Topics Concern  . Not on file  Social History Narrative  . Not on file    Allergies:  No Known Allergies   Review of Systems: [y] = yes, [ ]  = no .............. Unable to assess since the patient is intubated  . General: Weight gain [ ] ; Weight loss [ ] ; Anorexia [ ] ; Fatigue [ ] ; Fever [ ] ; Chills [ ] ; Weakness [ ]   . Cardiac: Chest pain/pressure [ ] ; Resting SOB [ ] ; Exertional SOB [ ] ; Orthopnea [ ] ; Pedal Edema [ ] ; Palpitations [ ] ; Syncope [ ] ; Presyncope [ ] ; Paroxysmal nocturnal dyspnea[ ]   . Pulmonary: Cough [ ] ; Wheezing[ ] ; Hemoptysis[ ] ; Sputum [ ] ; Snoring [ ]   . GI: Vomiting[ ] ; Dysphagia[ ] ; Melena[ ] ; Hematochezia [ ] ; Heartburn[ ] ; Abdominal pain [ ] ; Constipation [ ] ; Diarrhea [ ] ; BRBPR [ ]   . GU: Hematuria[ ] ; Dysuria [ ] ; Nocturia[ ]   . Vascular: Pain in legs with walking [ ] ; Pain in feet with lying flat [ ] ; Non-healing sores  [ ] ; Stroke [ ] ; TIA [ ] ; Slurred speech [ ] ;  . Neuro: Headaches[ ] ; Vertigo[ ] ; Seizures[ ] ; Paresthesias[ ] ;Blurred vision [ ] ; Diplopia [ ] ; Vision changes [ ]   . Ortho/Skin: Arthritis [ ] ; Joint pain [ ] ; Muscle pain [ ] ; Joint swelling [ ] ; Back Pain [ ] ; Rash [ ]   . Psych: Depression[ ] ; Anxiety[ ]   . Heme: Bleeding problems [ ] ; Clotting disorders [ ] ; Anemia [ ]   . Endocrine: Diabetes [ ] ; Thyroid dysfunction[ ]      Objective:    Vital Signs:   Temp:  [96.1  F (35.6 C)-97.7 F (36.5 C)] 97.7 F (36.5 C) (09/24 2000) Pulse Rate:  [79-97] 84 (09/24 2000) Resp:  [15-19] 16 (09/24 2000) BP: (116-138)/(77-97) 126/82 (09/24 2000) SpO2:  [100 %] 100 % (09/24 2000) FiO2 (%):  [40 %-100 %] 40 % (09/24 1730) Weight:  [100 kg-120.2 kg] 120.2 kg (09/24 1824)    Weight change: Filed Weights   12/25/17 1625 12/25/17 1824  Weight: 100 kg 120.2 kg    Intake/Output:   Intake/Output Summary (Last 24 hours) at 12/25/2017 2030 Last data filed at 12/25/2017 2015 Gross per 24 hour  Intake 118.67 ml  Output 750 ml  Net -631.33 ml      Physical Exam    General:  Well nourished. intubated HEENT: normal Neck: supple. JVP . Carotids 2+ bilat; no bruits. No lymphadenopathy or thyromegaly appreciated. Cor: PMI nondisplaced. Regular rate & rhythm. No rubs, gallops or murmurs. Lungs: clear Abdomen: soft, nontender, nondistended. No hepatosplenomegaly. No bruits or masses. Extremities: no cyanosis, clubbing, rash, edema Neuro: the patient is intubated    EKG    12/25/17 - Sinus tachycardia (with some baseline artifact), rate 96 bpm, QTc 443, q waves in III and aVF  Labs   Basic Metabolic Panel: Recent Labs  Lab 12/25/17 1646  NA 139  K 4.3  CL 104  CO2 24  GLUCOSE 132*  BUN 14  CREATININE 1.20  CALCIUM 9.4    Liver Function Tests: Recent Labs  Lab 12/25/17 1646  AST 28  ALT 32  ALKPHOS 92  BILITOT 0.5  PROT 7.2  ALBUMIN 4.2   No results for input(s): LIPASE,  AMYLASE in the last 168 hours. No results for input(s): AMMONIA in the last 168 hours.  CBC: Recent Labs  Lab 12/25/17 1646  WBC 7.0  HGB 15.6  HCT 48.8  MCV 96.1  PLT 173    Cardiac Enzymes: No results for input(s): CKTOTAL, CKMB, CKMBINDEX, TROPONINI in the last 168 hours.  BNP: BNP (last 3 results) No results for input(s): BNP in the last 8760 hours.  ProBNP (last 3 results) No results for input(s): PROBNP in the last 8760 hours.   CBG: No results for input(s): GLUCAP in the last 168 hours.  Coagulation Studies: Recent Labs    12/25/17 1646  LABPROT 13.1  INR 1.00     Imaging   CT Head & CT Cervical Spine Wo Contrast Result Date: 12/25/2017 FINDINGS: CT HEAD FINDINGS Brain: No evidence of acute infarction, hemorrhage, hydrocephalus, extra-axial collection or mass lesion/mass effect. Vascular: No hyperdense vessel or unexpected calcification. Skull: Normal. Negative for fracture or focal lesion. Sinuses/Orbits: Mild left maxillary mucosal thickening is noted. Other: None. CT CERVICAL SPINE FINDINGS Alignment: Normal. Skull base and vertebrae: No acute fracture. No primary bone lesion or focal pathologic process. Soft tissues and spinal canal: No prevertebral fluid or swelling. No visible canal hematoma. Disc levels:  Normal. Upper chest: Negative. Other: None.  IMPRESSION: No acute intracranial abnormality seen. Normal cervical spine.   Dg Chest Port 1 View Result Date: 12/25/2017 FINDINGS: The tip of an endotracheal tube terminates approximately 5 cm above the carina in satisfactory position. A gastric tube extends below the left hemidiaphragm into the expected location of the stomach. The tip and side port are excluded on this exam. Stable cardiomegaly with aortic atherosclerosis is noted. No pulmonary consolidation nor overt pulmonary edema. No effusion. No acute osseous abnormality.  IMPRESSION: Satisfactory endotracheal tube position terminating 5 cm above the  carina. A gastric tube is  seen extending below the left hemidiaphragm presumably into the expected location of the stomach. The tip and side port are excluded. No active pulmonary disease.  Aortic atherosclerosis.   CT Angio Chest/Abdomen/Pelvis For Dissection W And/or Wo Contrast Result Date: 12/25/2017 FINDINGS: CTA CHEST FINDINGS Cardiovascular: Aorta normal caliber. No calcified atherosclerotic plaque identified. No intramural hematoma on precontrast imaging. Normal aortic enhancement post contrast without evidence of aneurysm or dissection. Pulmonary arteries grossly patent on non targeted exam. No evidence of pulmonary embolism. No pericardial effusion. Mediastinum/Nodes: Nasogastric tube traverses esophagus into stomach. Esophagus normal appearance. Base of cervical region unremarkable. No thoracic adenopathy. Lungs/Pleura: Dependent atelectasis in the posterior lungs bilaterally. No pulmonary infiltrate, pleural effusion or pneumothorax. Musculoskeletal: No definite fractures. Review of the MIP images confirms the above findings. CTA ABDOMEN AND PELVIS FINDINGS VASCULAR Aorta: Aorta patent and normal caliber.  No aneurysm or dissection. Celiac: Widely patent SMA: Widely patent Renals: Widely patent IMA: Widely patent Inflow: Widely patent normal appearing iliac systems bilaterally Veins: Unopacified and suboptimally assessed on CTA imaging Review of the MIP images confirms the above findings. NON-VASCULAR Hepatobiliary: Streak artifacts from patient's arms traverse upper abdomen. Gallbladder and liver otherwise normal appearance. Pancreas: Normal appearance Spleen: Normal appearance Adrenals/Urinary Tract: Adrenal glands, kidneys, ureters, and bladder normal appearance Stomach/Bowel: Appendix surgically absent. Tip of nasogastric tube at distal gastric antrum. Stomach and bowel loops unremarkable for technique. Lymphatic: No adenopathy Reproductive: Unremarkable prostate gland Other: No free air or free  fluid.  No hernia. Musculoskeletal: Normal appearance Review of the MIP images confirms the above findings.  IMPRESSION: No evidence of aortic aneurysm or dissection. Pulmonary arteries grossly patent. Dependent atelectasis in the posterior lungs bilaterally. No no additional intrathoracic, intra-abdominal or intrapelvic abnormalities.   Medications:     Current Medications: . aspirin  300 mg Rectal NOW  . fentaNYL (SUBLIMAZE) injection  50 mcg Intravenous Once  . FLUoxetine  60 mg Per Tube QHS  . heparin  5,000 Units Subcutaneous Q8H  . lamoTRIgine  200 mg Per Tube BID  . levETIRAcetam  700 mg Per Tube BID  . QUEtiapine  200 mg Per Tube QHS     Infusions: . sodium chloride    . sodium chloride 125 mL/hr at 12/25/17 1734  . sodium chloride    . famotidine (PEPCID) IV    . fentaNYL infusion INTRAVENOUS 250 mcg/hr (12/25/17 2015)  . propofol (DIPRIVAN) infusion 50 mcg/kg/min (12/25/17 1918)       Assessment/Plan    1. Witnessed arrest (etiology unknown) It is unclear if the patient had a cardiac event. An AED was not applied at the time of resuscitation and there is no documentation of an arrhythmia on telemetry in the hospital. The ECG does not reveal ST changes and the QT interval is not prolonged. The patient is not requiring pressors and is currently hemodynamically stable.  - Obtain serial ECGs and trend cardiac biomarkers - Check electrolytes and keep potassium >4.0 and magnesium >2.0 - Get an updated transthoracic echocardiogram to evaluate RV and LV function and rule out any significant valvular disease - Monitor for frequent PVCs, NSVT or VT - Will only consider ischemic work-up if Troponin is elevated (recent coronary CT did not reveal obstructive CAD)    Meade Maw, MD  12/25/2017, 8:30 PM  Cardiology Overnight Team Please contact [facility] Card[facility] for night-coverage after hours (4p -7a ) and weekends on [email or web address]

## 2017-12-25 NOTE — ED Notes (Signed)
Paged cards/AKHTER to resident

## 2017-12-26 ENCOUNTER — Inpatient Hospital Stay (HOSPITAL_COMMUNITY): Payer: Medicare Other

## 2017-12-26 DIAGNOSIS — R079 Chest pain, unspecified: Secondary | ICD-10-CM

## 2017-12-26 DIAGNOSIS — J9600 Acute respiratory failure, unspecified whether with hypoxia or hypercapnia: Secondary | ICD-10-CM

## 2017-12-26 DIAGNOSIS — R109 Unspecified abdominal pain: Secondary | ICD-10-CM

## 2017-12-26 DIAGNOSIS — R569 Unspecified convulsions: Secondary | ICD-10-CM

## 2017-12-26 DIAGNOSIS — I5030 Unspecified diastolic (congestive) heart failure: Secondary | ICD-10-CM

## 2017-12-26 LAB — BASIC METABOLIC PANEL
Anion gap: 11 (ref 5–15)
BUN: 10 mg/dL (ref 6–20)
CHLORIDE: 107 mmol/L (ref 98–111)
CO2: 21 mmol/L — AB (ref 22–32)
Calcium: 8.5 mg/dL — ABNORMAL LOW (ref 8.9–10.3)
Creatinine, Ser: 1.06 mg/dL (ref 0.61–1.24)
GFR calc Af Amer: >60 mL/min (ref >60)
GFR calc non Af Amer: >60 mL/min (ref >60)
Glucose, Bld: 94 mg/dL (ref 70–99)
POTASSIUM: 3.5 mmol/L (ref 3.5–5.1)
SODIUM: 139 mmol/L (ref 135–145)

## 2017-12-26 LAB — ECHOCARDIOGRAM COMPLETE
HEIGHTINCHES: 72 in
Weight: 3915.37 oz

## 2017-12-26 LAB — C-REACTIVE PROTEIN: CRP: 1.9 mg/dL — ABNORMAL HIGH (ref <1.0)

## 2017-12-26 LAB — LACTIC ACID, PLASMA
Lactic Acid, Venous: 2.3 mmol/L (ref 0.5–1.9)
Lactic Acid, Venous: 4.7 mmol/L (ref 0.5–1.9)
Lactic Acid, Venous: 1.5 mmol/L (ref 0.5–1.9)

## 2017-12-26 LAB — POCT I-STAT 3, ART BLOOD GAS (G3+)
Acid-base deficit: 2 mmol/L (ref 0.0–2.0)
Bicarbonate: 22.8 mmol/L (ref 20.0–28.0)
O2 SAT: 91 %
PCO2 ART: 37.7 mmHg (ref 32.0–48.0)
PO2 ART: 60 mmHg — AB (ref 83.0–108.0)
Patient temperature: 97.31
TCO2: 24 mmol/L (ref 22–32)
pH, Arterial: 7.386 (ref 7.350–7.450)

## 2017-12-26 LAB — MRSA PCR SCREENING: MRSA BY PCR: NEGATIVE

## 2017-12-26 LAB — GLUCOSE, CAPILLARY
GLUCOSE-CAPILLARY: 92 mg/dL (ref 70–99)
Glucose-Capillary: 83 mg/dL (ref 70–99)
Glucose-Capillary: 88 mg/dL (ref 70–99)
Glucose-Capillary: 89 mg/dL (ref 70–99)

## 2017-12-26 LAB — HIV ANTIBODY (ROUTINE TESTING W REFLEX): HIV SCREEN 4TH GENERATION: NONREACTIVE

## 2017-12-26 LAB — CBC
HEMATOCRIT: 39.9 % (ref 39.0–52.0)
HEMOGLOBIN: 13.2 g/dL (ref 13.0–17.0)
MCH: 31.2 pg (ref 26.0–34.0)
MCHC: 33.1 g/dL (ref 30.0–36.0)
MCV: 94.3 fL (ref 78.0–100.0)
Platelets: 150 10*3/uL (ref 150–400)
RBC: 4.23 MIL/uL (ref 4.22–5.81)
RDW: 11.9 % (ref 11.5–15.5)
WBC: 8 10*3/uL (ref 4.0–10.5)

## 2017-12-26 LAB — MAGNESIUM: MAGNESIUM: 1.9 mg/dL (ref 1.7–2.4)

## 2017-12-26 LAB — SEDIMENTATION RATE: SED RATE: 10 mm/h (ref 0–16)

## 2017-12-26 LAB — TROPONIN I
Troponin I: <0.03 ng/mL (ref <0.03)
Troponin I: <0.03 ng/mL (ref <0.03)

## 2017-12-26 LAB — PHOSPHORUS: Phosphorus: 3.3 mg/dL (ref 2.5–4.6)

## 2017-12-26 MED ORDER — QUETIAPINE FUMARATE 100 MG PO TABS
200.0000 mg | ORAL_TABLET | Freq: Every day | ORAL | Status: DC
  Administered 2017-12-26 – 2017-12-27 (×2): 200 mg via ORAL
  Filled 2017-12-26: qty 2
  Filled 2017-12-26: qty 1

## 2017-12-26 MED ORDER — FLUOXETINE HCL 20 MG/5ML PO SOLN
60.0000 mg | Freq: Every day | ORAL | Status: DC
  Administered 2017-12-26 – 2017-12-27 (×2): 60 mg via ORAL
  Filled 2017-12-26 (×2): qty 15

## 2017-12-26 MED ORDER — ALUM & MAG HYDROXIDE-SIMETH 200-200-20 MG/5ML PO SUSP
30.0000 mL | ORAL | Status: DC | PRN
  Administered 2017-12-26: 30 mL via ORAL
  Filled 2017-12-26: qty 30

## 2017-12-26 MED ORDER — LEVETIRACETAM 100 MG/ML PO SOLN
1000.0000 mg | Freq: Two times a day (BID) | ORAL | Status: DC
  Administered 2017-12-26 – 2017-12-28 (×4): 1000 mg via ORAL
  Filled 2017-12-26 (×4): qty 10

## 2017-12-26 MED ORDER — FAMOTIDINE IN NACL 20-0.9 MG/50ML-% IV SOLN: 20.0000 mg | Freq: Two times a day (BID) | INTRAVENOUS | Status: DC

## 2017-12-26 MED ORDER — ORAL CARE MOUTH RINSE
15.0000 mL | OROMUCOSAL | Status: DC
  Administered 2017-12-26: 15 mL via OROMUCOSAL

## 2017-12-26 MED ORDER — CHLORHEXIDINE GLUCONATE 0.12% ORAL RINSE (MEDLINE KIT)
15.0000 mL | Freq: Two times a day (BID) | OROMUCOSAL | Status: DC
  Administered 2017-12-26: 15 mL via OROMUCOSAL

## 2017-12-26 MED ORDER — LEVETIRACETAM 100 MG/ML PO SOLN
1000.0000 mg | Freq: Two times a day (BID) | ORAL | Status: DC
  Filled 2017-12-26: qty 10

## 2017-12-26 MED ORDER — LAMOTRIGINE 100 MG PO TABS
200.0000 mg | ORAL_TABLET | Freq: Two times a day (BID) | ORAL | Status: DC
  Administered 2017-12-26 – 2017-12-28 (×4): 200 mg via ORAL
  Filled 2017-12-26 (×4): qty 2

## 2017-12-26 NOTE — Consult Note (Addendum)
Neurology Consultation  Reason for Consult: Possible seizure event Referring Physician: SCATLIFFE,   History is obtained from: Most of the history was obtained from prior notes and some from family members  HPI: [name] [name] is a 37 y.o. male with a history Behcet's disease along with seizures.  He is seen by Dr. Floyde Parkins of [facility] neurology for both his seizures and Behcet's.    Looking through the office notes, there is a note from Dr. Jannifer Franklin that states on 08/03/2016 that the patient was in the emergency room.  Apparently at that time he stated to his mother that he felt sick, he knew he was going to have a seizure.  His mother took him immediately to the emergency room and the patient had a seizure after he arrived.  At that time neuro hospitalist had seen patient and again had noted that the mother was a poor historian for patient's seizures and description of his seizures .   Per notes he had a recurrent seizure on 12/20/16 associated with a Todd's paralysis. The patient had right sided weakness and aphasia, a scan of the brain was not done at that time.  There is no mention of clenching of chest prior to seizure during that note.    Later, it appears that on October 16, 2017 he had a syncopal event that occurred during a CT scan.  He stated he began to feel poorly, his heart rate went up to the 100s but then dropped into the 50s, his blood pressure dropped and he had  a syncopal event.  No seizure activity was noted.    On 12/25/2017 patient was at the pool swimming when he apparently per EMS note he complained of chest pain, became unresponsive.  The on duty lifeguard ran to his side and found him pulseless and started CPR.  One round of CPR was performed with return of spontaneous circulation.  Patient at that point was found with a GCS of 3, was bagged and patient was brought to [facility].  Due to elevated lactic acid and and history of seizure (PCCM had a history from mother that he is  seizures are usually presented with chest pain and then syncope, however, talking to the mother while I was in the room, mother had no recollection of ever noting seizure activity or clenching of his chest).  Her main observation is that he felt lethargic and not himself over the past week.  His main complaint is that he felt pain that radiated from his stomach to his back especially when he sat up, prior to the spell today.  In further discussion with the mother the clenching of his chest that has been mentioned occurred approximately 3 weeks ago at which time he saw his cardiologist.  Patient states that he definitely has seizures, that these are treated by Dr. Jannifer Franklin, but that they are distinct from the chest pain episodes. His seizures are described as initially consisting of flashing lights on the right side of his visual field followed by loss of consciousness and collapse, then tonic-clonic activity with drooling and arms flexed and adducted at his chest, followed by somnolence after the movement ceases, then prolonged postictal phase consisting of confusion.  Patient is currently on Lamictal and Keppra for this. Due to swallowing difficulties, his Keppra is a liquid formulation and his Lamictal is a dissolvable tablet.    ROS: A 14 point ROS was performed and is negative except as noted in the HPI.  Past Medical History:  Diagnosis Date  . Behcet's disease ([place])   . Bipolar disorder ([place])   . Hypertension   . Migraine   . Seizures ([place])    last experienced a seizure x 3 years ago  . Vitamin D deficiency      Family History  Problem Relation Age of Onset  . Lung cancer Paternal Grandfather   . Other Father        BPPV     Social History:   reports that he has never smoked. He has never used smokeless tobacco. He reports that he does not drink alcohol or use drugs.  Medications  Current Facility-Administered Medications:  .  0.9 %  sodium chloride infusion, , Intravenous,  Continuous PRN, Keenan Bachelor, MD, 1,000 mL at 12/25/17 1606 .  0.9 %  sodium chloride infusion, , Intravenous, Continuous, Desai, Rahul P, PA-C, Stopped at 12/26/17 1458 .  0.9 %  sodium chloride infusion, 250 mL, Intravenous, PRN, Desai, Rahul P, PA-C .  alum & mag hydroxide-simeth (MAALOX/MYLANTA) 200-200-20 MG/5ML suspension 30 mL, 30 mL, Oral, Q4H PRN, Mannam, Praveen, MD, 30 mL at 12/26/17 1555 .  aspirin suppository 300 mg, 300 mg, Rectal, NOW, Mattox, Trevor, MD .  dextrose 50 % solution 50 mL, 1 ampule, Intravenous, PRN, Scatliffe, Rise Paganini, MD .  etomidate (AMIDATE) injection, , Intravenous, PRN, Keenan Bachelor, MD, 25 mg at 12/25/17 1605 .  famotidine (PEPCID) IVPB 20 mg premix, 20 mg, Intravenous, Q12H, Desai, Rahul P, PA-C, Stopped at 12/26/17 1012 .  fentaNYL (SUBLIMAZE) bolus via infusion 50 mcg, 50 mcg, Intravenous, Q1H PRN, Keenan Bachelor, MD .  fentaNYL (SUBLIMAZE) injection 50 mcg, 50 mcg, Intravenous, Once, Keenan Bachelor, MD .  fentaNYL (SUBLIMAZE) injection, , Intravenous, PRN, Keenan Bachelor, MD, 50 mcg at 12/25/17 1615 .  fentaNYL 2574mcg in NS 232mL (76mcg/ml) infusion-PREMIX, 25-400 mcg/hr, Intravenous, Continuous, Keenan Bachelor, MD, Stopped at 12/26/17 1341 .  FLUoxetine (PROZAC) 20 MG/5ML solution 60 mg, 60 mg, Per Tube, QHS, Desai, Rahul P, PA-C, 60 mg at 12/25/17 2213 .  heparin injection 5,000 Units, 5,000 Units, Subcutaneous, Q8H, Desai, Rahul P, PA-C, 5,000 Units at 12/26/17 1400 .  lamoTRIgine (LAMICTAL) tablet 200 mg, 200 mg, Per Tube, BID, Desai, Rahul P, PA-C, 200 mg at 12/26/17 0942 .  levETIRAcetam (KEPPRA) 100 MG/ML solution 700 mg, 700 mg, Per Tube, BID, Desai, Rahul P, PA-C, 700 mg at 12/26/17 0945 .  LORazepam (ATIVAN) injection 2-4 mg, 2-4 mg, Intravenous, Q4H PRN, Flora Lipps, MD .  midazolam (VERSED) injection 1-2 mg, 1-2 mg, Intravenous, Q2H PRN, Desai, Rahul P, PA-C .  propofol (DIPRIVAN) 1000 MG/100ML infusion, 0-50 mcg/kg/min, Intravenous,  Continuous, Keenan Bachelor, MD, Stopped at 12/26/17 [phone] .  QUEtiapine (SEROQUEL) tablet 200 mg, 200 mg, Per Tube, QHS, Desai, Rahul P, PA-C, 200 mg at 12/25/17 2213 .  succinylcholine (ANECTINE) injection, , Intravenous, PRN, Keenan Bachelor, MD, 120 mg at 12/25/17 1606   Exam: Current vital signs: BP (!) 141/68   Pulse (!) 102   Temp 98.6 F (37 C)   Resp 15   Ht 6' (1.829 m)   Wt 111 kg   SpO2 100%   BMI 33.19 kg/m  Vital signs in last 24 hours: Temp:  [96.1 F (35.6 C)-98.6 F (37 C)] 98.6 F (37 C) (09/25 1300) Pulse Rate:  [70-111] 102 (09/25 1500) Resp:  [15-35] 15 (09/25 1500) BP: (81-158)/(66-97) 141/68 (09/25 1500) SpO2:  [98 %-100 %] 100 % (09/25 1500) FiO2 (%):  [40 %-  60 %] 40 % (09/25 1131) Weight:  [100 kg-120.2 kg] 111 kg (09/25 0419)  GENERAL: Awake, alert in NAD HEENT: - Normocephalic and atraumatic, dry mm, no LN++, no Thyromegally LUNGS - Clear to auscultation bilaterally with no wheezes CV - S1S2 RRR, no m/r/g, equal pulses bilaterally. ABDOMEN - Soft, nontender, nondistended with normoactive BS Ext: warm, well perfused, intact peripheral pulses, no edema  NEURO:  Mental Status: Mildly drowsy, A&Ox3, speech is clear.  Naming, repetition, fluency, and comprehension intact. Cranial Nerves: PERRL 55mm/brisk. EOMI, visual fields full, no facial asymmetry, facial sensation intact, hearing intact, tongue/uvula unremarkable Motor: All extremities move antigravity with no difficulty and without asymmetry. No tremor, jerking, twitching or shivering motions noticed Tone is normal and bulk is normal Sensation- Intact to light touch bilaterally Coordination: FTN intact bilaterally, no ataxia in BLE. Gait- deferred   Labs I have reviewed labs in epic and the results pertinent to this consultation are:   CBC    Component Value Date/Time   WBC 8.0 12/26/2017 0641   RBC 4.23 12/26/2017 0641   HGB 13.2 12/26/2017 0641   HGB 14.7 04/27/2016 0926   HCT 39.9  12/26/2017 0641   HCT 45.3 04/27/2016 0926   PLT 150 12/26/2017 0641   PLT 182 04/27/2016 0926   MCV 94.3 12/26/2017 0641   MCV 95 04/27/2016 0926   MCH 31.2 12/26/2017 0641   MCHC 33.1 12/26/2017 0641   RDW 11.9 12/26/2017 0641   RDW 13.4 04/27/2016 0926   LYMPHSABS 1.3 10/16/2017 1644   LYMPHSABS 1.5 04/27/2016 0926   MONOABS 0.6 10/16/2017 1644   EOSABS 0.2 10/16/2017 1644   EOSABS 0.3 04/27/2016 0926   BASOSABS 0.0 10/16/2017 1644   BASOSABS 0.0 04/27/2016 0926    CMP     Component Value Date/Time   NA 139 12/26/2017 0641   NA 142 04/27/2016 0926   K 3.5 12/26/2017 0641   CL 107 12/26/2017 0641   CO2 21 (L) 12/26/2017 0641   GLUCOSE 94 12/26/2017 0641   BUN 10 12/26/2017 0641   BUN 18 04/27/2016 0926   CREATININE 1.06 12/26/2017 0641   CALCIUM 8.5 (L) 12/26/2017 0641   PROT 7.2 12/25/2017 1646   PROT 7.1 04/27/2016 0926   ALBUMIN 4.2 12/25/2017 1646   ALBUMIN 4.3 04/27/2016 0926   AST 28 12/25/2017 1646   ALT 32 12/25/2017 1646   ALKPHOS 92 12/25/2017 1646   BILITOT 0.5 12/25/2017 1646   BILITOT 0.3 04/27/2016 0926   GFRNONAA >60 12/26/2017 0641   GFRAA >60 12/26/2017 0641    Lipid Panel     Component Value Date/Time   TRIG 417 (H) 08/03/2016 1548     Imaging I have reviewed the images obtained:  CT-scan of the brain and cervical spine which showed no intracranial abnormalities and normal cervical spine    Etta Quill PA-C [facility] [phone]  M-F  (9:00 am- 5:00 PM)  12/26/2017, 4:23 PM     Assessment: This is a 37 year old male with history of seizures with stereotyped, well-defined semiology as noted above. He presents after an episode of chest pain with LOC, but no seizure activity.  1. He has a history of cardiac issues with chest pain as well as a prior syncopal event while obtaining a CT.  With the event precipitating this admission, the patient did not have a Levine sign. He was noted to be pulseless by lifeguard with  immediate return of circulation after 1 round of CPR.   2. Due  to no seizure activity noted at the pool or during this admission, the elevated lactic acid could easily be secondary to anaerobic state prior to event induced by swimming or due to the cardiac arrest.   3. Mother states that the precipitating event was not due to typical seizure activity. However, given that she is a poor historian, the event may have been a seizure. His LOC is compatible with seizure and the chest pain preceding his collapse may have been due to autonomic hyperactivity, which can occur during epileptic seizures.   Recommendations: - EEG - Will increase his Keppra to 1000 mg twice daily (liquid formulation) -- Continue Lamictal at 200 mg BID (oral dissolving formulation) -- Inpatient seizure precautions -- Cardiology is consulting -- Per [facility] statutes, patients with seizures are not allowed to drive until  they have been seizure-free for six months. Use caution when using heavy equipment or power tools. Avoid working on ladders or at heights. Take showers instead of baths. Ensure the water temperature is not too high on the home water heater. Do not go swimming alone. When caring for infants or small children, sit down when holding, feeding, or changing them to minimize risk of injury to the child in the event you have a seizure. Also, Maintain good sleep hygiene. Avoid alcohol.  Addendum:  EEG completed with report conclusions as follows:  This is a normal awake electroencephalogram. There are no focal lateralizing or epileptiform features.  I have seen and examined the patient. I have amended the assessment and recommendations above.  Electronically signed: Dr. Kerney Elbe

## 2017-12-26 NOTE — Progress Notes (Signed)
Fentanyl drip D/Ced 75 mL wasted. Witnessed by Everlene Other, RN.

## 2017-12-26 NOTE — Progress Notes (Signed)
C-Collar removed under the order of S. Minor, NP. C spine was clear on CT scan.

## 2017-12-26 NOTE — Progress Notes (Signed)
NAME:  [name] [name], MRN:  [id number], DOB:  03/31/1981, LOS: 1 ADMISSION DATE:  12/25/2017, CONSULTATION DATE:  12/25/17 REFERRING MD:  Gilford Raid, EDP CHIEF COMPLAINT:  Cardiac Arrest   Brief History   [name] [name] is a 37 y.o. male who was brought to [facility] ED 9/24 after presumed brief cardiac arrest at the [facility] while he was swimming (came out of pool, clamped chest, unresponsive). ROSC after 1 - 2 minutes; therefore, questionable whether truly lost pulses or not. In ED, he had GCS of 3 therefore was intubated.  Significant Hospital Events   9/24 > admit.  Consults: date of consult/date signed off & final recs:  Cardiology 9/24   Procedures (surgical and bedside):  ETT 9/24 >   Significant Diagnostic Tests:  CT head & C-spine 9/24 > negative. CTA chest / abd / pelv 9/24 > negative. Echo 9/25 >   Micro Data: Blood 9/24 >  Sputum 9/24 >  Urine 9/24 >   Antimicrobials:  None.   Subjective:  Awake alert follows commands despite being on fentanyl drip  Objective   Blood pressure (!) 158/86, pulse (!) 110, temperature (!) 97.5 F (36.4 C), resp. rate 18, height 6' (1.829 m), weight 111 kg, SpO2 100 %.    Vent Mode: CPAP;PSV FiO2 (%):  [40 %-100 %] 40 % Set Rate:  [16 bmp] 16 bmp Vt Set:  [620 mL] 620 mL PEEP:  [5 cmH20] 5 cmH20 Pressure Support:  [5 cmH20-10 cmH20] 5 cmH20 Plateau Pressure:  [10 cmH20-18 cmH20] 17 cmH20   Intake/Output Summary (Last 24 hours) at 12/26/2017 1159 Last data filed at 12/26/2017 1100 Gross per 24 hour  Intake 2795.81 ml  Output 1500 ml  Net 1295.81 ml   Filed Weights   12/25/17 1625 12/25/17 1824 12/26/17 0419  Weight: 100 kg 120.2 kg 111 kg    Examination: General: On nurse well-developed male who is awake and follows commands HEENT: Tracheal tube in place, orogastric tube to low intermittent suction Neuro: Follows commands moves all extremities x4 able to lift and stick tongue out CV: s1s2 rrr, no m/r/g PULM: even/non-labored, lungs  bilaterally clear GI: Nondistended, soft tender to touch, he complains of excruciating abdominal pain Extremities: warm/dry, negative edema  Skin: Old scar noted lower extremity   Assessment & Plan:   ? Cardiac arrest - unclear etiology; however, not clear whether pt truly did lose pulses or not.  Per notes, was swimming at [facility] then got out of the pool and clamped his chest before collapsing.  Lifeguard did not appreciate pulses; therefore, began CPR and ROSC was achieved after just 1 round.  Labs, EKG, imaging all normal.  UDS pending. Plan:  Continue supportive care Cardiology consult is noted and appreciated   Respiratory insufficiency - due to above. Plan:  Meets criteria for extubation Spinal cord is clear  Sedation needs due to mechanical ventilation. Plan: Sedation: Currently only on fentanyl drip Assess for extubation 12/26/2017  Elevated lactic acid - ? Due to CPR. Plan:  Lactic acid trending down as of 12/26/2017 down to 2.3  Hx seizures - no seizure like activity reported by EMS. Plan:  Continue antiseizure medication 12/26/2017 awake alert  Hx Behcet's. Plan:  Evaluate ESR which was normal 12/26/2017 Complains of abdominal pain  Hx Bipolar disorder, anxiety, migraines. Plan:  Continue home medications Seroquel and Prozac Currently on fentanyl drip for tube tolerance  Disposition / Summary of Today's Plan 12/26/17   12/26/2017 ICU Plan for extubation  today Complaining of abdominal pain abdominal film ordered    Diet: NPO. Pain/Anxiety/Delirium protocol (if indicated): As above. DVT prophylaxis: SCD's / Heparin. GI prophylaxis: Famotidine Hyperglycemia protocol: None. Mobility: Bedrest. Code Status: Full. Family Communication: Mother and father updated at bedside.  Labs   CBC: Recent Labs  Lab 12/25/17 1646 12/26/17 0641  WBC 7.0 8.0  HGB 15.6 13.2  HCT 48.8 39.9  MCV 96.1 94.3  PLT 173 527   Basic Metabolic Panel: Recent Labs  Lab  12/25/17 1646 12/26/17 0641  NA 139 139  K 4.3 3.5  CL 104 107  CO2 24 21*  GLUCOSE 132* 94  BUN 14 10  CREATININE 1.20 1.06  CALCIUM 9.4 8.5*  MG  --  1.9  PHOS  --  3.3   GFR: Estimated Creatinine Clearance: 120.4 mL/min (by C-G formula based on SCr of 1.06 mg/dL). Recent Labs  Lab 12/25/17 1646 12/25/17 1657 12/25/17 2353 12/26/17 0641  WBC 7.0  --   --  8.0  LATICACIDVEN  --  4.24* 2.3*  --    Liver Function Tests: Recent Labs  Lab 12/25/17 1646  AST 28  ALT 32  ALKPHOS 92  BILITOT 0.5  PROT 7.2  ALBUMIN 4.2   No results for input(s): LIPASE, AMYLASE in the last 168 hours. No results for input(s): AMMONIA in the last 168 hours. ABG    Component Value Date/Time   PHART 7.386 12/26/2017 0349   PCO2ART 37.7 12/26/2017 0349   PO2ART 60.0 (L) 12/26/2017 0349   HCO3 22.8 12/26/2017 0349   TCO2 24 12/26/2017 0349   ACIDBASEDEF 2.0 12/26/2017 0349   O2SAT 91.0 12/26/2017 0349    Coagulation Profile: Recent Labs  Lab 12/25/17 1646  INR 1.00   Cardiac Enzymes: Recent Labs  Lab 12/25/17 2353 12/26/17 0641  TROPONINI <0.03 <0.03   HbA1C: Hgb A1c MFr Bld  Date/Time Value Ref Range Status  07/08/2016 06:14 AM 5.4 4.8 - 5.6 % Final    Comment:    (NOTE)         Pre-diabetes: 5.7 - 6.4         Diabetes: >6.4         Glycemic control for adults with diabetes: <7.0    CBG: Recent Labs  Lab 12/25/17 2105 12/25/17 2124 12/26/17 0000 12/26/17 0338  GLUCAP 44* 101* 83 92    Admitting History of Present Illness.   Pt is encephelopathic; therefore, this HPI is obtained from chart review. [name] [name] is a 37 y.o. male who  has a past medical history of Behcet's disease ([place]), Bipolar disorder ([place]), Hypertension, Migraine, Seizures ([place]), and Vitamin D deficiency.  He presented to [facility] ED on 9/24 after concern for cardiac arrest.  He was apparently swimming at the [facility] with his father when he got out of the pool, clamped his chest, then became  unresponsive.  Lifeguard on the scene did not appreciate pulses; therefore, began CPR.  CPR was performed for just 1 round prior to ROSC.   He was then transported to the ED where he remained unresponsive.  He was subsequently intubated and PCCM was called for admission.  At the time of our eval, pt is sedated; however, he does open eyes to voice and is able to follow basic commands.  He appears somewhat uncomfortable on 92mg/kg/min propofol and 1561m/hr fentanyl.  I have asked RN to increase his sedation for a goal RASS of -1.  Per his parents, he had been complaining of  vague abdominal pain radiating into his back for the past 1 week or so. No nausea/vomiting/diarrhea.  Had been tolerating PO intake fine.  Denies any chest pain, dyspnea, lightheadedness, myalgias, syncope.  No known drug use.  Has seizure disorder but parents say this has been controlled recently and pt has not had any episodes.  He follows with [facility].  No seizure like activity reported by EMS or RN in ED.    App cct 30 min  [name] [name] ACNP Maryanna Shape PCCM Pager [phone] till 1 pm If no answer page [phone] 12/26/2017, 11:59 AM

## 2017-12-26 NOTE — Progress Notes (Signed)
Progress Note  Patient Name: [name] [name] Date of Encounter: 12/26/2017  Primary Cardiologist:   No primary care provider on file.   Subjective   Intubated and sedated  Inpatient Medications    Scheduled Meds: . aspirin  300 mg Rectal NOW  . chlorhexidine gluconate (MEDLINE KIT)  15 mL Mouth Rinse BID  . fentaNYL (SUBLIMAZE) injection  50 mcg Intravenous Once  . FLUoxetine  60 mg Per Tube QHS  . heparin  5,000 Units Subcutaneous Q8H  . lamoTRIgine  200 mg Per Tube BID  . levETIRAcetam  700 mg Per Tube BID  . mouth rinse  15 mL Mouth Rinse 10 times per day  . QUEtiapine  200 mg Per Tube QHS   Continuous Infusions: . sodium chloride    . sodium chloride 100 mL/hr at 12/26/17 1100  . sodium chloride    . famotidine (PEPCID) IV Stopped (12/26/17 1012)  . fentaNYL infusion INTRAVENOUS 100 mcg/hr (12/26/17 1100)  . propofol (DIPRIVAN) infusion Stopped (12/26/17 0944)   PRN Meds: sodium chloride, sodium chloride, dextrose, etomidate, fentaNYL, fentaNYL, LORazepam, midazolam, succinylcholine   Vital Signs    Vitals:   12/26/17 0735 12/26/17 0800 12/26/17 0938 12/26/17 1131  BP: 94/69 100/72 (!) 149/97 (!) 158/86  Pulse: 73 70 (!) 104 (!) 110  Resp: _0 Temp:  (!) 96.6 F (35.9 C) (!) 97.5 F (36.4 C)   TempSrc:      SpO2: 100% 100% 100% 100%  Weight:      Height:        Intake/Output Summary (Last 24 hours) at 12/26/2017 1151 Last data filed at 12/26/2017 1100 Gross per 24 hour  Intake 2795.81 ml  Output 1500 ml  Net 1295.81 ml   Filed Weights   12/25/17 1625 12/25/17 1824 12/26/17 0419  Weight: 100 kg 120.2 kg 111 kg    Telemetry    NSR, sinus tach and artifact - Personally Reviewed  ECG    NA - Personally Reviewed  Physical Exam   GEN: No acute distress.   Neck: No  JVD Cardiac: RRR, no murmurs, rubs, or gallops.  Respiratory: Clear  to auscultation bilaterally. GI: Soft, nontender, non-distended  MS: No  edema; No deformity. Neuro:   Nonfocal  Psych: Normal affect   Labs    Chemistry Recent Labs  Lab 12/25/17 1646 12/26/17 0641  NA 139 139  K 4.3 3.5  CL 104 107  CO2 24 21*  GLUCOSE 132* 94  BUN 14 10  CREATININE 1.20 1.06  CALCIUM 9.4 8.5*  PROT 7.2  --   ALBUMIN 4.2  --   AST 28  --   ALT 32  --   ALKPHOS 92  --   BILITOT 0.5  --   GFRNONAA >60 >60  GFRAA >60 >60  ANIONGAP 11 11     Hematology Recent Labs  Lab 12/25/17 1646 12/26/17 0641  WBC 7.0 8.0  RBC 5.08 4.23  HGB 15.6 13.2  HCT 48.8 39.9  MCV 96.1 94.3  MCH 30.7 31.2  MCHC 32.0 33.1  RDW 11.6 11.9  PLT 173 150    Cardiac Enzymes Recent Labs  Lab 12/25/17 2353 12/26/17 0641  TROPONINI <0.03 <0.03    Recent Labs  Lab 12/25/17 1655  TROPIPOC 0.00     BNPNo results for input(s): BNP, PROBNP in the last 168 hours.   DDimer No results for input(s): DDIMER in the last 168 hours.   Radiology  Ct Head Wo Contrast  Result Date: 12/25/2017 CLINICAL DATA:  Witnessed cardiac arrest. History of seizures. Altered level of consciousness. EXAM: CT HEAD WITHOUT CONTRAST CT CERVICAL SPINE WITHOUT CONTRAST TECHNIQUE: Multidetector CT imaging of the head and cervical spine was performed following the standard protocol without intravenous contrast. Multiplanar CT image reconstructions of the cervical spine were also generated. COMPARISON:  CT scan of Aug 03, 2016. FINDINGS: CT HEAD FINDINGS Brain: No evidence of acute infarction, hemorrhage, hydrocephalus, extra-axial collection or mass lesion/mass effect. Vascular: No hyperdense vessel or unexpected calcification. Skull: Normal. Negative for fracture or focal lesion. Sinuses/Orbits: Mild left maxillary mucosal thickening is noted. Other: None. CT CERVICAL SPINE FINDINGS Alignment: Normal. Skull base and vertebrae: No acute fracture. No primary bone lesion or focal pathologic process. Soft tissues and spinal canal: No prevertebral fluid or swelling. No visible canal hematoma. Disc levels:   Normal. Upper chest: Negative. Other: None. IMPRESSION: No acute intracranial abnormality seen. Normal cervical spine. Electronically Signed   By: Marijo Conception, M.D.   On: 12/25/2017 18:02   Ct Cervical Spine Wo Contrast  Result Date: 12/25/2017 CLINICAL DATA:  Witnessed cardiac arrest. History of seizures. Altered level of consciousness. EXAM: CT HEAD WITHOUT CONTRAST CT CERVICAL SPINE WITHOUT CONTRAST TECHNIQUE: Multidetector CT imaging of the head and cervical spine was performed following the standard protocol without intravenous contrast. Multiplanar CT image reconstructions of the cervical spine were also generated. COMPARISON:  CT scan of Aug 03, 2016. FINDINGS: CT HEAD FINDINGS Brain: No evidence of acute infarction, hemorrhage, hydrocephalus, extra-axial collection or mass lesion/mass effect. Vascular: No hyperdense vessel or unexpected calcification. Skull: Normal. Negative for fracture or focal lesion. Sinuses/Orbits: Mild left maxillary mucosal thickening is noted. Other: None. CT CERVICAL SPINE FINDINGS Alignment: Normal. Skull base and vertebrae: No acute fracture. No primary bone lesion or focal pathologic process. Soft tissues and spinal canal: No prevertebral fluid or swelling. No visible canal hematoma. Disc levels:  Normal. Upper chest: Negative. Other: None. IMPRESSION: No acute intracranial abnormality seen. Normal cervical spine. Electronically Signed   By: Marijo Conception, M.D.   On: 12/25/2017 18:02   Dg Chest Port 1 View  Result Date: 12/26/2017 CLINICAL DATA:  Respiratory failure.  Short of breath. EXAM: PORTABLE CHEST 1 VIEW COMPARISON:  Yesterday FINDINGS: Endotracheal and NG tubes are stable. Low volumes. Bibasilar atelectasis. No pneumothorax or pleural effusion. IMPRESSION: Stable bibasilar atelectasis and support apparatus. Electronically Signed   By: Marybelle Killings M.D.   On: 12/26/2017 10:36   Dg Chest Port 1 View  Result Date: 12/25/2017 CLINICAL DATA:  Respiratory  failure, status post intubation. EXAM: PORTABLE CHEST 1 VIEW COMPARISON:  09/06/2017 FINDINGS: The tip of an endotracheal tube terminates approximately 5 cm above the carina in satisfactory position. A gastric tube extends below the left hemidiaphragm into the expected location of the stomach. The tip and side port are excluded on this exam. Stable cardiomegaly with aortic atherosclerosis is noted. No pulmonary consolidation nor overt pulmonary edema. No effusion. No acute osseous abnormality. IMPRESSION: Satisfactory endotracheal tube position terminating 5 cm above the carina. A gastric tube is seen extending below the left hemidiaphragm presumably into the expected location of the stomach. The tip and side port are excluded. No active pulmonary disease.  Aortic atherosclerosis. Electronically Signed   By: Ashley Royalty M.D.   On: 12/25/2017 16:28   Ct Angio Chest/abd/pel For Dissection W And/or Wo Contrast  Result Date: 12/25/2017 CLINICAL DATA:  Witnessed cardiac arrest, patient clutched chest  and went down to ground, was pulseless, CPR initiated, history hypertension EXAM: CT ANGIOGRAPHY CHEST, ABDOMEN AND PELVIS TECHNIQUE: Multidetector CT imaging through the chest, abdomen and pelvis was performed using the standard protocol during bolus administration of intravenous contrast. Multiplanar reconstructed images and MIPs were obtained and reviewed to evaluate the vascular anatomy. CONTRAST:  100 ML ISOVUE-370 IOPAMIDOL (ISOVUE-370) INJECTION 76% IV COMPARISON:  02/02/2003 FINDINGS: CTA CHEST FINDINGS Cardiovascular: Aorta normal caliber. No calcified atherosclerotic plaque identified. No intramural hematoma on precontrast imaging. Normal aortic enhancement post contrast without evidence of aneurysm or dissection. Pulmonary arteries grossly patent on non targeted exam. No evidence of pulmonary embolism. No pericardial effusion. Mediastinum/Nodes: Nasogastric tube traverses esophagus into stomach. Esophagus  normal appearance. Base of cervical region unremarkable. No thoracic adenopathy. Lungs/Pleura: Dependent atelectasis in the posterior lungs bilaterally. No pulmonary infiltrate, pleural effusion or pneumothorax. Musculoskeletal: No definite fractures. Review of the MIP images confirms the above findings. CTA ABDOMEN AND PELVIS FINDINGS VASCULAR Aorta: Aorta patent and normal caliber.  No aneurysm or dissection. Celiac: Widely patent SMA: Widely patent Renals: Widely patent IMA: Widely patent Inflow: Widely patent normal appearing iliac systems bilaterally Veins: Unopacified and suboptimally assessed on CTA imaging Review of the MIP images confirms the above findings. NON-VASCULAR Hepatobiliary: Streak artifacts from patient's arms traverse upper abdomen. Gallbladder and liver otherwise normal appearance. Pancreas: Normal appearance Spleen: Normal appearance Adrenals/Urinary Tract: Adrenal glands, kidneys, ureters, and bladder normal appearance Stomach/Bowel: Appendix surgically absent. Tip of nasogastric tube at distal gastric antrum. Stomach and bowel loops unremarkable for technique. Lymphatic: No adenopathy Reproductive: Unremarkable prostate gland Other: No free air or free fluid.  No hernia. Musculoskeletal: Normal appearance Review of the MIP images confirms the above findings. IMPRESSION: No evidence of aortic aneurysm or dissection. Pulmonary arteries grossly patent. Dependent atelectasis in the posterior lungs bilaterally. No no additional intrathoracic, intra-abdominal or intrapelvic abnormalities. Electronically Signed   By: Lavonia Dana M.D.   On: 12/25/2017 18:08    Cardiac Studies   ECHO:  Pending.  Patient Profile     37 y.o. male with a past medical history significant for Behcet's disease (with skin, GI and ocular manifestations), depression, anxiety, bipolar disorder, hypertension, hyperlipidemia, obesity, hiatal hernia, migraine headaches, obstructive sleep apnea and gastritis who was  brought to our ED after he had a witnessed arrest (?) in the field.  Assessment & Plan    Witnessed arrest:  Prior recent cardiac work up negative for any obstructive CAD and NL EF.  Presenting story did allow [place] to understand the initial event.  However, there was no suggestion of a primary arrhythmia.  Doubt that this was a cardiac arrest.  Negative enzymes.  Echo pending but doubt we will need any other cardiac work up.        For questions or updates, please contact [place] Please consult [email or web address] for contact info under Cardiology/STEMI.   Signed, Minus Breeding, MD  12/26/2017, 11:51 AM

## 2017-12-26 NOTE — Progress Notes (Signed)
Called pharmacy to change all medication from per tube to oral.

## 2017-12-26 NOTE — Progress Notes (Signed)
Pt states abd pain is 10/10 and stomach is leaking from the inside. Dr. Vaughan Browner paged. Fentanyl increased from 50 mcg to 100 mcg.

## 2017-12-26 NOTE — Procedures (Signed)
ELECTROENCEPHALOGRAM REPORT   Patient: [name] [name]       Room #: [facility] EEG No. ID: [id number] Age: 37 y.o.        Sex: male Referring Physician: Cheral Marker Report Date:  12/26/2017        Interpreting Physician: Alexis Goodell  History: [name] is an 37 y.o. male with a history of seizures presenting after a syncopal event  Medications:  Pepcid, Fentanyl, Prozac, Lamictal, Keppra, Seroquel  Conditions of Recording:  This is a 21 channel routine scalp EEG performed with bipolar and monopolar montages arranged in accordance to the international 10/20 system of electrode placement. One channel was dedicated to EKG recording.  The patient is in the awake state.  Description:  The waking background activity consists of a low voltage, symmetrical, fairly well organized, 9-10 Hz alpha activity, seen from the parieto-occipital and posterior temporal regions.  Low voltage fast activity, poorly organized, is seen anteriorly and is at times superimposed on more posterior regions.  A mixture of theta and alpha rhythms are seen from the central and temporal regions. The patient does not drowse or sleep. No epileptiform activity is noted.   Hyperventilation and intermittent photic stimulation were not performed.   IMPRESSION: This is a normal awake electroencephalogram. There are no focal lateralizing or epileptiform features.   Comment:  An EEG with the patient sleep deprived to elicit drowse and light sleep may be desirable to further elicit a possible seizure disorder.     Alexis Goodell, MD Neurology [phone] 12/26/2017, 6:20 PM

## 2017-12-26 NOTE — Progress Notes (Signed)
  Echocardiogram 2D Echocardiogram has been performed.  Madelaine Etienne 12/26/2017, 3:20 PM

## 2017-12-26 NOTE — Progress Notes (Signed)
BEDSIDE EEG COMPLETED; RESULTS PENDING.

## 2017-12-26 NOTE — Progress Notes (Signed)
CRITICAL VALUE ALERT  Critical Value: Lactic 4.7  Date & Time Notied:  9/25 1447  Provider Notified: Mannam  Orders Received/Actions taken: Waiting for orders

## 2017-12-26 NOTE — Procedures (Signed)
Extubation Procedure Note  Patient Details:   Name: [name] [name] DOB: 07-22-1980 MRN: [id number]   Airway Documentation:    Vent end date: 12/26/17 Vent end time: 1319   Evaluation  O2 sats: stable throughout Complications: No apparent complications Patient did tolerate procedure well. Bilateral Breath Sounds: Clear   Yes   Patient extubated to 4L [place]. No stridor noted. RN at bedside.  Donnetta Hail 12/26/2017, 1:20 PM

## 2017-12-27 ENCOUNTER — Other Ambulatory Visit: Payer: Self-pay

## 2017-12-27 ENCOUNTER — Encounter (HOSPITAL_COMMUNITY): Payer: Self-pay | Admitting: General Practice

## 2017-12-27 ENCOUNTER — Inpatient Hospital Stay (HOSPITAL_COMMUNITY): Payer: Medicare Other

## 2017-12-27 LAB — BASIC METABOLIC PANEL
ANION GAP: 7 (ref 5–15)
BUN: 6 mg/dL (ref 6–20)
CO2: 26 mmol/L (ref 22–32)
Calcium: 8.4 mg/dL — ABNORMAL LOW (ref 8.9–10.3)
Chloride: 107 mmol/L (ref 98–111)
Creatinine, Ser: 1.08 mg/dL (ref 0.61–1.24)
GFR calc Af Amer: >60 mL/min (ref >60)
GFR calc non Af Amer: >60 mL/min (ref >60)
GLUCOSE: 110 mg/dL — AB (ref 70–99)
Potassium: 3.6 mmol/L (ref 3.5–5.1)
Sodium: 140 mmol/L (ref 135–145)

## 2017-12-27 LAB — CBC
HEMATOCRIT: 40.2 % (ref 39.0–52.0)
Hemoglobin: 13.1 g/dL (ref 13.0–17.0)
MCH: 30.8 pg (ref 26.0–34.0)
MCHC: 32.6 g/dL (ref 30.0–36.0)
MCV: 94.4 fL (ref 78.0–100.0)
Platelets: 137 10*3/uL — ABNORMAL LOW (ref 150–400)
RBC: 4.26 MIL/uL (ref 4.22–5.81)
RDW: 11.6 % (ref 11.5–15.5)
WBC: 7 10*3/uL (ref 4.0–10.5)

## 2017-12-27 LAB — MAGNESIUM: Magnesium: 2.1 mg/dL (ref 1.7–2.4)

## 2017-12-27 LAB — TRIGLYCERIDES: TRIGLYCERIDES: 405 mg/dL — AB (ref <150)

## 2017-12-27 LAB — PHOSPHORUS: Phosphorus: 3.4 mg/dL (ref 2.5–4.6)

## 2017-12-27 MED ORDER — FAMOTIDINE 40 MG/5ML PO SUSR
20.0000 mg | Freq: Two times a day (BID) | ORAL | Status: DC
  Administered 2017-12-27 – 2017-12-28 (×3): 20 mg via ORAL
  Filled 2017-12-27 (×3): qty 2.5

## 2017-12-27 MED ORDER — IBUPROFEN 400 MG PO TABS
400.0000 mg | ORAL_TABLET | Freq: Four times a day (QID) | ORAL | Status: DC | PRN
  Administered 2017-12-27: 400 mg via ORAL
  Filled 2017-12-27: qty 1

## 2017-12-27 NOTE — Social Work (Signed)
CSW acknowledging consult for SNF placement. Will follow for therapy recommendations.   [name] [name] H [name] [name], LCSWA [facility] Clinical Social Work [phone]   

## 2017-12-27 NOTE — Progress Notes (Signed)
EEG was normal, with no ongoing seizure activity.   A/R: -Continue patient on Keppra 1000 mg BID and Lamictal 200 mg BID.  -Outpatient follow up with his neurologist -Cardiology work up for chest pain. -Neurology will sign off. Please call if there are additional questions.   Electronically signed: Dr. Kerney Elbe

## 2017-12-27 NOTE — Progress Notes (Signed)
Progress Note  Patient Name: [name] [name] Date of Encounter: 12/27/2017  Primary Cardiologist:   Ezzard Standing, MD   Subjective   Extubated.  He does not recall all of the events from the "arrest".  He says he had a sharp pain and went took a knee when he got out of the pool.  As previously stated he has had pain before but a negative work up for CAD.   Inpatient Medications    Scheduled Meds: . famotidine  20 mg Oral BID  . FLUoxetine  60 mg Oral QHS  . heparin  5,000 Units Subcutaneous Q8H  . lamoTRIgine  200 mg Oral BID  . levETIRAcetam  1,000 mg Oral BID  . QUEtiapine  200 mg Oral QHS   Continuous Infusions: . sodium chloride    . sodium chloride 50 mL/hr at 12/27/17 0916  . sodium chloride     PRN Meds: sodium chloride, sodium chloride, alum & mag hydroxide-simeth, dextrose, etomidate, LORazepam, succinylcholine   Vital Signs    Vitals:   12/27/17 0334 12/27/17 0500 12/27/17 0739 12/27/17 0805  BP:   126/76   Pulse:   (!) 103   Resp:   18   Temp: 98.2 F (36.8 C)   98.4 F (36.9 C)  TempSrc: Oral   Oral  SpO2:   94%   Weight:  110 kg    Height:        Intake/Output Summary (Last 24 hours) at 12/27/2017 1011 Last data filed at 12/27/2017 0900 Gross per 24 hour  Intake 4361.86 ml  Output 2700 ml  Net 1661.86 ml   Filed Weights   12/25/17 1824 12/26/17 0419 12/27/17 0500  Weight: 120.2 kg 111 kg 110 kg    Telemetry     - Personally Reviewed  ECG    NA - Personally Reviewed  Physical Exam   GEN: No  acute distress.   Neck: No  JVD Cardiac: RRR, no murmurs, rubs, or gallops.  Respiratory: Clear   to auscultation bilaterally. GI: Soft, nontender, non-distended, normal bowel sounds  MS:  No edema; No deformity. Neuro:   Nonfocal  Psych: Oriented and appropriate   Labs    Chemistry Recent Labs  Lab 12/25/17 1646 12/26/17 0641 12/27/17 0518  NA 139 139 140  K 4.3 3.5 3.6  CL 104 107 107  CO2 24 21* 26  GLUCOSE 132* 94 110*  BUN  14 10 6   CREATININE 1.20 1.06 1.08  CALCIUM 9.4 8.5* 8.4*  PROT 7.2  --   --   ALBUMIN 4.2  --   --   AST 28  --   --   ALT 32  --   --   ALKPHOS 92  --   --   BILITOT 0.5  --   --   GFRNONAA >60 >60 >60  GFRAA >60 >60 >60  ANIONGAP 11 11 7      Hematology Recent Labs  Lab 12/25/17 1646 12/26/17 0641 12/27/17 0518  WBC 7.0 8.0 7.0  RBC 5.08 4.23 4.26  HGB 15.6 13.2 13.1  HCT 48.8 39.9 40.2  MCV 96.1 94.3 94.4  MCH 30.7 31.2 30.8  MCHC 32.0 33.1 32.6  RDW 11.6 11.9 11.6  PLT 173 150 137*    Cardiac Enzymes Recent Labs  Lab 12/25/17 2353 12/26/17 0641  TROPONINI <0.03 <0.03    Recent Labs  Lab 12/25/17 1655  TROPIPOC 0.00     BNPNo results for input(s): BNP, PROBNP in  the last 168 hours.   DDimer No results for input(s): DDIMER in the last 168 hours.   Radiology    Ct Head Wo Contrast  Result Date: 12/25/2017 CLINICAL DATA:  Witnessed cardiac arrest. History of seizures. Altered level of consciousness. EXAM: CT HEAD WITHOUT CONTRAST CT CERVICAL SPINE WITHOUT CONTRAST TECHNIQUE: Multidetector CT imaging of the head and cervical spine was performed following the standard protocol without intravenous contrast. Multiplanar CT image reconstructions of the cervical spine were also generated. COMPARISON:  CT scan of Aug 03, 2016. FINDINGS: CT HEAD FINDINGS Brain: No evidence of acute infarction, hemorrhage, hydrocephalus, extra-axial collection or mass lesion/mass effect. Vascular: No hyperdense vessel or unexpected calcification. Skull: Normal. Negative for fracture or focal lesion. Sinuses/Orbits: Mild left maxillary mucosal thickening is noted. Other: None. CT CERVICAL SPINE FINDINGS Alignment: Normal. Skull base and vertebrae: No acute fracture. No primary bone lesion or focal pathologic process. Soft tissues and spinal canal: No prevertebral fluid or swelling. No visible canal hematoma. Disc levels:  Normal. Upper chest: Negative. Other: None. IMPRESSION: No acute  intracranial abnormality seen. Normal cervical spine. Electronically Signed   By: Marijo Conception, M.D.   On: 12/25/2017 18:02   Ct Cervical Spine Wo Contrast  Result Date: 12/25/2017 CLINICAL DATA:  Witnessed cardiac arrest. History of seizures. Altered level of consciousness. EXAM: CT HEAD WITHOUT CONTRAST CT CERVICAL SPINE WITHOUT CONTRAST TECHNIQUE: Multidetector CT imaging of the head and cervical spine was performed following the standard protocol without intravenous contrast. Multiplanar CT image reconstructions of the cervical spine were also generated. COMPARISON:  CT scan of Aug 03, 2016. FINDINGS: CT HEAD FINDINGS Brain: No evidence of acute infarction, hemorrhage, hydrocephalus, extra-axial collection or mass lesion/mass effect. Vascular: No hyperdense vessel or unexpected calcification. Skull: Normal. Negative for fracture or focal lesion. Sinuses/Orbits: Mild left maxillary mucosal thickening is noted. Other: None. CT CERVICAL SPINE FINDINGS Alignment: Normal. Skull base and vertebrae: No acute fracture. No primary bone lesion or focal pathologic process. Soft tissues and spinal canal: No prevertebral fluid or swelling. No visible canal hematoma. Disc levels:  Normal. Upper chest: Negative. Other: None. IMPRESSION: No acute intracranial abnormality seen. Normal cervical spine. Electronically Signed   By: Marijo Conception, M.D.   On: 12/25/2017 18:02   Dg Chest Port 1 View  Result Date: 12/27/2017 CLINICAL DATA:  Respiratory failure EXAM: PORTABLE CHEST 1 VIEW COMPARISON:  12/26/2017 FINDINGS: Cardiac shadow is prominent again accentuated by the portable technique. The endotracheal tube and nasogastric catheter have been removed in the interval. The overall inspiratory effort is stable. No focal infiltrate is seen. No bony abnormality is noted. IMPRESSION: No active disease. Electronically Signed   By: Inez Catalina M.D.   On: 12/27/2017 09:24   Dg Chest Port 1 View  Result Date:  12/26/2017 CLINICAL DATA:  Respiratory failure.  Short of breath. EXAM: PORTABLE CHEST 1 VIEW COMPARISON:  Yesterday FINDINGS: Endotracheal and NG tubes are stable. Low volumes. Bibasilar atelectasis. No pneumothorax or pleural effusion. IMPRESSION: Stable bibasilar atelectasis and support apparatus. Electronically Signed   By: Marybelle Killings M.D.   On: 12/26/2017 10:36   Dg Chest Port 1 View  Result Date: 12/25/2017 CLINICAL DATA:  Respiratory failure, status post intubation. EXAM: PORTABLE CHEST 1 VIEW COMPARISON:  09/06/2017 FINDINGS: The tip of an endotracheal tube terminates approximately 5 cm above the carina in satisfactory position. A gastric tube extends below the left hemidiaphragm into the expected location of the stomach. The tip and side port are excluded  on this exam. Stable cardiomegaly with aortic atherosclerosis is noted. No pulmonary consolidation nor overt pulmonary edema. No effusion. No acute osseous abnormality. IMPRESSION: Satisfactory endotracheal tube position terminating 5 cm above the carina. A gastric tube is seen extending below the left hemidiaphragm presumably into the expected location of the stomach. The tip and side port are excluded. No active pulmonary disease.  Aortic atherosclerosis. Electronically Signed   By: Ashley Royalty M.D.   On: 12/25/2017 16:28   Dg Abd Portable 1v  Result Date: 12/26/2017 CLINICAL DATA:  Acute generalized abdominal pain. EXAM: PORTABLE ABDOMEN - 1 VIEW COMPARISON:  None. FINDINGS: The bowel gas pattern is normal. Distal tip of nasogastric tube is seen in expected position of the stomach. No radio-opaque calculi or other significant radiographic abnormality are seen. IMPRESSION: Distal tip of nasogastric tube seen in expected position of the stomach. No evidence of bowel obstruction or ileus. Electronically Signed   By: Marijo Conception, M.D.   On: 12/26/2017 12:38   Ct Angio Chest/abd/pel For Dissection W And/or Wo Contrast  Result Date:  12/25/2017 CLINICAL DATA:  Witnessed cardiac arrest, patient clutched chest and went down to ground, was pulseless, CPR initiated, history hypertension EXAM: CT ANGIOGRAPHY CHEST, ABDOMEN AND PELVIS TECHNIQUE: Multidetector CT imaging through the chest, abdomen and pelvis was performed using the standard protocol during bolus administration of intravenous contrast. Multiplanar reconstructed images and MIPs were obtained and reviewed to evaluate the vascular anatomy. CONTRAST:  100 ML ISOVUE-370 IOPAMIDOL (ISOVUE-370) INJECTION 76% IV COMPARISON:  02/02/2003 FINDINGS: CTA CHEST FINDINGS Cardiovascular: Aorta normal caliber. No calcified atherosclerotic plaque identified. No intramural hematoma on precontrast imaging. Normal aortic enhancement post contrast without evidence of aneurysm or dissection. Pulmonary arteries grossly patent on non targeted exam. No evidence of pulmonary embolism. No pericardial effusion. Mediastinum/Nodes: Nasogastric tube traverses esophagus into stomach. Esophagus normal appearance. Base of cervical region unremarkable. No thoracic adenopathy. Lungs/Pleura: Dependent atelectasis in the posterior lungs bilaterally. No pulmonary infiltrate, pleural effusion or pneumothorax. Musculoskeletal: No definite fractures. Review of the MIP images confirms the above findings. CTA ABDOMEN AND PELVIS FINDINGS VASCULAR Aorta: Aorta patent and normal caliber.  No aneurysm or dissection. Celiac: Widely patent SMA: Widely patent Renals: Widely patent IMA: Widely patent Inflow: Widely patent normal appearing iliac systems bilaterally Veins: Unopacified and suboptimally assessed on CTA imaging Review of the MIP images confirms the above findings. NON-VASCULAR Hepatobiliary: Streak artifacts from patient's arms traverse upper abdomen. Gallbladder and liver otherwise normal appearance. Pancreas: Normal appearance Spleen: Normal appearance Adrenals/Urinary Tract: Adrenal glands, kidneys, ureters, and bladder  normal appearance Stomach/Bowel: Appendix surgically absent. Tip of nasogastric tube at distal gastric antrum. Stomach and bowel loops unremarkable for technique. Lymphatic: No adenopathy Reproductive: Unremarkable prostate gland Other: No free air or free fluid.  No hernia. Musculoskeletal: Normal appearance Review of the MIP images confirms the above findings. IMPRESSION: No evidence of aortic aneurysm or dissection. Pulmonary arteries grossly patent. Dependent atelectasis in the posterior lungs bilaterally. No no additional intrathoracic, intra-abdominal or intrapelvic abnormalities. Electronically Signed   By: Lavonia Dana M.D.   On: 12/25/2017 18:08    Cardiac Studies   ECHO:  Study Conclusions  - Left ventricle: The cavity size was normal. Wall thickness was   normal. Systolic function was vigorous. The estimated ejection   fraction was in the range of 65% to 70%. Wall motion was normal;   there were no regional wall motion abnormalities. Doppler   parameters are consistent with abnormal left ventricular  relaxation (grade 1 diastolic dysfunction). The E/e&' ratio is   between 8-15, suggesting indeterminate LV filling pressure. - Mitral valve: Mildly thickened leaflets . There was trivial   regurgitation. - Left atrium: The atrium was normal in size. - Inferior vena cava: The vessel was normal in size. The   respirophasic diameter changes were in the normal range (>= 50%),   consistent with normal central venous pressure.  Patient Profile     37 y.o. male with a past medical history significant for Behcet's disease (with skin, GI and ocular manifestations), depression, anxiety, bipolar disorder, hypertension, hyperlipidemia, obesity, hiatal hernia, migraine headaches, obstructive sleep apnea and gastritis who was brought to our ED after he had a witnessed arrest (?) in the field.  Assessment & Plan    Witnessed arrest:  Echo is normal.  Previous cardiac work up without CAD.  No  objective evidence of arrhythmia.  Tele with NSR.  EKG without acute abnormalities on presentation.   Labs OK.  No further cardiac work up.    [facility] will sign off.   Medication Recommendations:  None Other recommendations (labs, testing, etc):  None Follow up as an outpatient:  Can follow with Dr. Wynonia Lawman.    For questions or updates, please contact [facility] Please consult [email or web address] for contact info under Cardiology/STEMI.   Signed, Minus Breeding, MD  12/27/2017, 10:11 AM

## 2017-12-27 NOTE — Progress Notes (Signed)
Family at bedside brought patient chinese soup, educated patient about his clear diet. Patient stated " I am aware  I will just drink the broth". MD notified gave new order to advance diet as tolerated.

## 2017-12-27 NOTE — Progress Notes (Signed)
NAME:  [name] [name], MRN:  [id number], DOB:  06-30-1980, LOS: 2 ADMISSION DATE:  12/25/2017, CONSULTATION DATE:  12/25/17 REFERRING MD:  Gilford Raid, EDP CHIEF COMPLAINT:  Cardiac Arrest   Brief History   [name] [name] is a 37 y.o. male who was brought to [facility] ED 9/24 after presumed brief cardiac arrest at the [facility] while he was swimming (came out of pool, clamped chest, unresponsive). ROSC after 1 - 2 minutes; therefore, questionable whether truly lost pulses or not. In ED, he had GCS of 3 therefore was intubated.  Significant Hospital Events   9/24 > admit. 12/27/2017 transfer to floor to [facility] to be picked up 12/28/2017  Consults: date of consult/date signed off & final recs:  Cardiology 9/24 >> 925 Neurology 9/24>> 925   Procedures (surgical and bedside):  ETT 9/24 > 12/26/2017  Significant Diagnostic Tests:  CT head & C-spine 9/24 > negative. CTA chest / abd / pelv 9/24 > negative. Echo 9/25 > EF 65 to 70% essentially normal echo  Micro Data: Blood 9/24 >  Sputum 9/24 >  Urine 9/24 >   Antimicrobials:  None.   Subjective:  Awake alert follows commands despite being on fentanyl drip  Objective   Blood pressure 126/76, pulse (!) 103, temperature 98.4 F (36.9 C), temperature source Oral, resp. rate 18, height 6' (1.829 m), weight 110 kg, SpO2 94 %.    Vent Mode: CPAP;PSV FiO2 (%):  [40 %] 40 % PEEP:  [5 cmH20] 5 cmH20 Pressure Support:  [5 cmH20-10 cmH20] 5 cmH20   Intake/Output Summary (Last 24 hours) at 12/27/2017 0811 Last data filed at 12/27/2017 0600 Gross per 24 hour  Intake 4312.87 ml  Output 2700 ml  Net 1612.87 ml   Filed Weights   12/25/17 1824 12/26/17 0419 12/27/17 0500  Weight: 120.2 kg 111 kg 110 kg    Examination: General: Well-nourished well-developed male distress HEENT: MM pink/moist, pupils equal reactive light Neuro: Intact awake dull effect CV: s1s2 rrr, no m/r/g PULM: even/non-labored, lungs bilaterally air GI: Soft mildly tender  positive bowel sounds Extremities: warm/dry, negative edema  Skin: no rashes or lesions    Assessment & Plan:   ? Cardiac arrest - unclear etiology; however, not clear whether pt truly did lose pulses or not.  Per notes, was swimming at the [facility] then got out of the pool and clamped his chest before collapsing.  Lifeguard did not appreciate pulses; therefore, began CPR and ROSC was achieved after just 1 round.  Labs, EKG, imaging all normal.  UDS pending. Plan:  Continue supportive care Cardiology input appreciated no acute source found for cardiac arrest   Respiratory insufficiency - due to above. Plan:  Meets criteria for extubation Spinal cord is clear  Sedation needs due to mechanical ventilation. Plan:  Resolved post extubation on 12/26/2017  Elevated lactic acid - ? Due to CPR. Plan:  Lactic acid to decrease to 2.3,2 days ago went up to 4.7 one day ago was rechecked the a.m. of 12/27/2017 is now normalized 1.5.  He was treated with boluses of fluid and normal saline 100 cc an hour.  We will decrease normal saline to [facility] now he is taking p.o. fluids.  Hx seizures - no seizure like activity reported by EMS. Plan:  Continue seizure medication Neurology signed off  Hx Behcet's. Plan:  ESR was normal 12/26/2017 Abdominal pain it has been evaluated with CTA and abdominal x-ray without acute findings  Hx Bipolar disorder, anxiety, migraines. Plan:  Continue home medications He is off all narcotics on 12/27/2017  Disposition / Summary of Today's Plan 12/27/17   12/27/2017 he was extubated on 12/26/2017 and is currently on room air. Hemodynamically stable No acute distress Will transfer to the floor and to [facility] service and arrange for psychiatry to evaluate. Note he is followed weekly by the [facility] psychiatric group.     Diet: As tolerated Pain/Anxiety/Delirium protocol (if indicated): Resuming home psychotropic drug DVT prophylaxis: SCD's / Heparin. GI prophylaxis:  Famotidine Hyperglycemia protocol: None. Mobility: Out of bed as tolerated Code Status: Full. Family Communication: 12/27/2017 no family at bedside.  Labs   CBC: Recent Labs  Lab 12/25/17 1646 12/26/17 0641 12/27/17 0518  WBC 7.0 8.0 7.0  HGB 15.6 13.2 13.1  HCT 48.8 39.9 40.2  MCV 96.1 94.3 94.4  PLT 173 150 893*   Basic Metabolic Panel: Recent Labs  Lab 12/25/17 1646 12/26/17 0641 12/27/17 0518  NA 139 139 140  K 4.3 3.5 3.6  CL 104 107 107  CO2 24 21* 26  GLUCOSE 132* 94 110*  BUN '14 10 6  ' CREATININE 1.20 1.06 1.08  CALCIUM 9.4 8.5* 8.4*  MG  --  1.9 2.1  PHOS  --  3.3 3.4   GFR: Estimated Creatinine Clearance: 117.7 mL/min (by C-G formula based on SCr of 1.08 mg/dL). Recent Labs  Lab 12/25/17 1646 12/25/17 1657 12/25/17 2353 12/26/17 0641 12/26/17 1355 12/26/17 1900 12/27/17 0518  WBC 7.0  --   --  8.0  --   --  7.0  LATICACIDVEN  --  4.24* 2.3*  --  4.7* 1.5  --    Liver Function Tests: Recent Labs  Lab 12/25/17 1646  AST 28  ALT 32  ALKPHOS 92  BILITOT 0.5  PROT 7.2  ALBUMIN 4.2   No results for input(s): LIPASE, AMYLASE in the last 168 hours. No results for input(s): AMMONIA in the last 168 hours. ABG    Component Value Date/Time   PHART 7.386 12/26/2017 0349   PCO2ART 37.7 12/26/2017 0349   PO2ART 60.0 (L) 12/26/2017 0349   HCO3 22.8 12/26/2017 0349   TCO2 24 12/26/2017 0349   ACIDBASEDEF 2.0 12/26/2017 0349   O2SAT 91.0 12/26/2017 0349    Coagulation Profile: Recent Labs  Lab 12/25/17 1646  INR 1.00   Cardiac Enzymes: Recent Labs  Lab 12/25/17 2353 12/26/17 0641  TROPONINI <0.03 <0.03   HbA1C: Hgb A1c MFr Bld  Date/Time Value Ref Range Status  07/08/2016 06:14 AM 5.4 4.8 - 5.6 % Final    Comment:    (NOTE)         Pre-diabetes: 5.7 - 6.4         Diabetes: >6.4         Glycemic control for adults with diabetes: <7.0    CBG: Recent Labs  Lab 12/25/17 2124 12/26/17 0000 12/26/17 0338 12/26/17 1130  12/26/17 1545  GLUCAP 101* 83 92 89 88    Admitting History of Present Illness.   Pt is encephelopathic; therefore, this HPI is obtained from chart review. [name] [name] is a 36 y.o. male who  has a past medical history of Behcet's disease ([place]), Bipolar disorder ([place]), Hypertension, Migraine, Seizures ([place]), and Vitamin D deficiency.  He presented to [facility] ED on 9/24 after concern for cardiac arrest.  He was apparently swimming at the [facility] with his father when he got out of the pool, clamped his chest, then became unresponsive.  Lifeguard on the scene  did not appreciate pulses; therefore, began CPR.  CPR was performed for just 1 round prior to ROSC.   He was then transported to the ED where he remained unresponsive.  He was subsequently intubated and PCCM was called for admission.  At the time of our eval, pt is sedated; however, he does open eyes to voice and is able to follow basic commands.  He appears somewhat uncomfortable on 72mg/kg/min propofol and 1570m/hr fentanyl.  I have asked RN to increase his sedation for a goal RASS of -1.  Per his parents, he had been complaining of vague abdominal pain radiating into his back for the past 1 week or so. No nausea/vomiting/diarrhea.  Had been tolerating PO intake fine.  Denies any chest pain, dyspnea, lightheadedness, myalgias, syncope.  No known drug use.  Has seizure disorder but parents say this has been controlled recently and pt has not had any episodes.  [facility]  No seizure like activity reported by EMS or RN in ED.      StRichardson Landryinor ACNP LeMaryanna ShapeCCM Pager [phone] 1 pm If no answer page [phone]/26/2019, 8:11 AM

## 2017-12-27 NOTE — Plan of Care (Signed)

## 2017-12-27 NOTE — Plan of Care (Signed)
  Problem: Coping: Goal: Level of anxiety will decrease Outcome: Progressing   Problem: Pain Managment: Goal: General experience of comfort will improve Outcome: Progressing   

## 2017-12-28 LAB — BASIC METABOLIC PANEL
Anion gap: 8 (ref 5–15)
BUN: 9 mg/dL (ref 6–20)
CO2: 25 mmol/L (ref 22–32)
Calcium: 8.6 mg/dL — ABNORMAL LOW (ref 8.9–10.3)
Chloride: 106 mmol/L (ref 98–111)
Creatinine, Ser: 1.12 mg/dL (ref 0.61–1.24)
GFR calc non Af Amer: >60 mL/min (ref >60)
Glucose, Bld: 100 mg/dL — ABNORMAL HIGH (ref 70–99)
Potassium: 3.4 mmol/L — ABNORMAL LOW (ref 3.5–5.1)
SODIUM: 139 mmol/L (ref 135–145)

## 2017-12-28 LAB — LACTIC ACID, PLASMA: Lactic Acid, Venous: 0.9 mmol/L (ref 0.5–1.9)

## 2017-12-28 LAB — PHOSPHORUS: Phosphorus: 3.1 mg/dL (ref 2.5–4.6)

## 2017-12-28 LAB — MAGNESIUM: MAGNESIUM: 2.1 mg/dL (ref 1.7–2.4)

## 2017-12-28 MED ORDER — FAMOTIDINE 40 MG/5ML PO SUSR: 20.0000 mg | Freq: Two times a day (BID) | ORAL | 0 refills | Status: DC

## 2017-12-28 MED ORDER — LEVETIRACETAM 100 MG/ML PO SOLN: 1000.0000 mg | Freq: Two times a day (BID) | ORAL | 0 refills | Status: DC

## 2017-12-28 NOTE — ED Provider Notes (Signed)
Procedure Name: Intubation Date/Time: 12/28/2017 2:51 PM Performed by: Keenan Bachelor, MD Pre-anesthesia Checklist: Patient identified, Emergency Drugs available, Suction available, Patient being monitored and Timeout performed Oxygen Delivery Method: Non-rebreather mask Preoxygenation: Pre-oxygenation with 100% oxygen Induction Type: Rapid sequence Ventilation: Mask ventilation without difficulty Laryngoscope Size: Mac and 3 Grade View: Grade II Tube size: 7.5 mm Number of attempts: 1 Airway Equipment and Method: Patient positioned with wedge pillow,  Rigid stylet and Oral airway Placement Confirmation: ETT inserted through vocal cords under direct vision,  Positive ETCO2 and Breath sounds checked- equal and bilateral Secured at: 24 cm Tube secured with: ETT holder Difficulty Due To: Difficulty was unanticipated         Keenan Bachelor, MD 12/28/17 1453    Isla Pence, MD 12/28/17 1511

## 2017-12-28 NOTE — Social Work (Signed)
CSW aware pt discharging, no further needs at this time.   CSW signing off. Please consult if any additional needs arise.  Alexander Mt, [place] Work [phone]

## 2017-12-28 NOTE — Care Management Important Message (Signed)
Important Message  Patient Details  Name: [name] [name] MRN: [id number] Date of Birth: July 15, 1980   Medicare Important Message Given:  Yes    [name] Montine Circle 12/28/2017, 3:55 PM

## 2017-12-28 NOTE — Discharge Summary (Signed)
Physician Discharge Summary  [name] [name] DJM:[id number] DOB: Apr 14, 1980 DOA: 12/25/2017  PCP: Leighton Ruff, MD  Admit date: 12/25/2017 Discharge date: 12/28/2017  Admitted From: Home  Disposition:  Home   Recommendations for Outpatient Follow-up and new medication changes:  1. Follow up with Dr. Drema Dallas in 7 days.  2. Keppra has been increased to 1000 mg bid. 3. Added famotidine for dyspepsia. 4. Follow up with Neurology in 7 days.   Per [facility] statutes, patients with seizures are not allowed to drive until  they have been seizure-free for six months. Use caution when using heavy equipment or power tools. Avoid working on ladders or at heights. Take showers instead of baths. Ensure the water temperature is not too high on the home water heater. Do not go swimming alone. When caring for infants or small children, sit down when holding, feeding, or changing them to minimize risk of injury to the child in the event you have a seizure. Also, Maintain good sleep hygiene. Avoid alcohol   Home Health: no   Equipment/Devices: no    Discharge Condition: stable  CODE STATUS: full  Diet recommendation: regular  Brief/Interim Summary: 37 year old male who presented after a suspected cardiac arrest.  He does have the significant past medical history for Behcet's disease, epilepsy, bipolar disorder, GERD status post Nissen fundoplication and sleep apnea.  Apparently patient had an episode of unresponsiveness after coming out of the swimming pool.  He received 1 round of chest compressions with recovery of spontaneous circulation after 1 to 2 minutes.  When EMS arrived his Glasgow Coma Scale was 3 and he was intubated.  On his initial physical examination blood pressure 126/82, heart rate 84, temperature 97.7, respiratory rate 16, oxygen saturation 100%. (On invasive mechanical ventilation PRVC).  His lungs were clear to auscultation bilaterally, heart S1-S2 present rhythmic, abdomen  protuberant, nontender, no lower extremity edema.  Sodium 139, potassium 4.3, chloride 104, bicarb 24, glucose 132, BUN 14, creatinine 1.20, white count 7.0, hemoglobin 15.6, hematocrit 48.8, platelets 173.  Lactate was 4.2, troponin I less than 0.03.  Head and cervical CT scan with no acute changes.  Chest radiograph negative for infiltrates, chest CT negative for pulmonary embolism.  His EKG was normal sinus rhythm, normal intervals, no ST elevations or ST depressions, no significant T wave abnormalities.  Patient was admitted to the intensive care unit with a working diagnosis of acute respiratory failure due to CNS dysfunction (acute metabolic encephalopathy), related to possible seizure or cardiac arrest.  1.  Acute respiratory failure due to CNS dysfunction.  Patient was placed on invasive mechanical ventilation, he recovered his consciousness and was successfully liberated from mechanical ventilation.  Radiographic surveillance with no infiltrates.  Oximetry saturation at discharge 97%.  Further cardiac work-up with echocardiography showed normal LV systolic function, no wall motion normalities.  2.  Suspected uncontrolled seizures.  Patient had frequent neuro checks, further work-up with electroencephalography was normal.  Patient was seen by neurology, recommendations to increase Keppra 2000 mg twice daily and continue Lamictal 200 g twice daily.  Follow-up with outpatient neurologist.  Continue seizure precautions.  3.  Dyspepsia.  Patient was placed on antiacids with no major complications.  She will be discharged on famotidine.  4. Bipolar. Continue clonazepam, fluoxetine, quetiapine.   5. Behcet's disease. Patient will continue Apremilast and mycophenolate.   Discharge Diagnoses:  Active Problems:   Cardiac arrest [facility])    Discharge Instructions   Allergies as of 12/28/2017   No  Known Allergies     Medication List    TAKE these medications   Apremilast 30 MG Tabs Take 30 mg  by mouth 2 (two) times daily.   clonazePAM 0.5 MG tablet Commonly known as:  KLONOPIN Take 1 tablet (0.5 mg total) by mouth 3 (three) times daily. For anxiety What changed:    how much to take  additional instructions   famotidine 40 MG/5ML suspension Commonly known as:  PEPCID Take 2.5 mLs (20 mg total) by mouth 2 (two) times daily.   FLUoxetine 20 MG/5ML solution Commonly known as:  PROZAC Take 80 mg by mouth daily.   lamoTRIgine 200 MG Tbdp Dissolve 200 mg in the mouth two times a day- morning and bedtime What changed:    how much to take  how to take this  when to take this  additional instructions   levETIRAcetam 100 MG/ML solution Commonly known as:  KEPPRA Take 10 mLs (1,000 mg total) by mouth 2 (two) times daily. What changed:  how much to take   mycophenolate 500 MG tablet Commonly known as:  CELLCEPT Take 1,000 mg by mouth 2 (two) times daily.   QUEtiapine 200 MG tablet Commonly known as:  SEROQUEL Take 200 mg by mouth at bedtime.       No Known Allergies  Consultations:  Neurology   Cardiology    Procedures/Studies: Ct Head Wo Contrast  Result Date: 12/25/2017 CLINICAL DATA:  Witnessed cardiac arrest. History of seizures. Altered level of consciousness. EXAM: CT HEAD WITHOUT CONTRAST CT CERVICAL SPINE WITHOUT CONTRAST TECHNIQUE: Multidetector CT imaging of the head and cervical spine was performed following the standard protocol without intravenous contrast. Multiplanar CT image reconstructions of the cervical spine were also generated. COMPARISON:  CT scan of Aug 03, 2016. FINDINGS: CT HEAD FINDINGS Brain: No evidence of acute infarction, hemorrhage, hydrocephalus, extra-axial collection or mass lesion/mass effect. Vascular: No hyperdense vessel or unexpected calcification. Skull: Normal. Negative for fracture or focal lesion. Sinuses/Orbits: Mild left maxillary mucosal thickening is noted. Other: None. CT CERVICAL SPINE FINDINGS Alignment:  Normal. Skull base and vertebrae: No acute fracture. No primary bone lesion or focal pathologic process. Soft tissues and spinal canal: No prevertebral fluid or swelling. No visible canal hematoma. Disc levels:  Normal. Upper chest: Negative. Other: None. IMPRESSION: No acute intracranial abnormality seen. Normal cervical spine. Electronically Signed   By: Marijo Conception, M.D.   On: 12/25/2017 18:02   Ct Cervical Spine Wo Contrast  Result Date: 12/25/2017 CLINICAL DATA:  Witnessed cardiac arrest. History of seizures. Altered level of consciousness. EXAM: CT HEAD WITHOUT CONTRAST CT CERVICAL SPINE WITHOUT CONTRAST TECHNIQUE: Multidetector CT imaging of the head and cervical spine was performed following the standard protocol without intravenous contrast. Multiplanar CT image reconstructions of the cervical spine were also generated. COMPARISON:  CT scan of Aug 03, 2016. FINDINGS: CT HEAD FINDINGS Brain: No evidence of acute infarction, hemorrhage, hydrocephalus, extra-axial collection or mass lesion/mass effect. Vascular: No hyperdense vessel or unexpected calcification. Skull: Normal. Negative for fracture or focal lesion. Sinuses/Orbits: Mild left maxillary mucosal thickening is noted. Other: None. CT CERVICAL SPINE FINDINGS Alignment: Normal. Skull base and vertebrae: No acute fracture. No primary bone lesion or focal pathologic process. Soft tissues and spinal canal: No prevertebral fluid or swelling. No visible canal hematoma. Disc levels:  Normal. Upper chest: Negative. Other: None. IMPRESSION: No acute intracranial abnormality seen. Normal cervical spine. Electronically Signed   By: Marijo Conception, M.D.   On: 12/25/2017 18:02  Dg Chest Port 1 View  Result Date: 12/27/2017 CLINICAL DATA:  Respiratory failure EXAM: PORTABLE CHEST 1 VIEW COMPARISON:  12/26/2017 FINDINGS: Cardiac shadow is prominent again accentuated by the portable technique. The endotracheal tube and nasogastric catheter have been  removed in the interval. The overall inspiratory effort is stable. No focal infiltrate is seen. No bony abnormality is noted. IMPRESSION: No active disease. Electronically Signed   By: Inez Catalina M.D.   On: 12/27/2017 09:24   Dg Chest Port 1 View  Result Date: 12/26/2017 CLINICAL DATA:  Respiratory failure.  Short of breath. EXAM: PORTABLE CHEST 1 VIEW COMPARISON:  Yesterday FINDINGS: Endotracheal and NG tubes are stable. Low volumes. Bibasilar atelectasis. No pneumothorax or pleural effusion. IMPRESSION: Stable bibasilar atelectasis and support apparatus. Electronically Signed   By: Marybelle Killings M.D.   On: 12/26/2017 10:36   Dg Chest Port 1 View  Result Date: 12/25/2017 CLINICAL DATA:  Respiratory failure, status post intubation. EXAM: PORTABLE CHEST 1 VIEW COMPARISON:  09/06/2017 FINDINGS: The tip of an endotracheal tube terminates approximately 5 cm above the carina in satisfactory position. A gastric tube extends below the left hemidiaphragm into the expected location of the stomach. The tip and side port are excluded on this exam. Stable cardiomegaly with aortic atherosclerosis is noted. No pulmonary consolidation nor overt pulmonary edema. No effusion. No acute osseous abnormality. IMPRESSION: Satisfactory endotracheal tube position terminating 5 cm above the carina. A gastric tube is seen extending below the left hemidiaphragm presumably into the expected location of the stomach. The tip and side port are excluded. No active pulmonary disease.  Aortic atherosclerosis. Electronically Signed   By: Ashley Royalty M.D.   On: 12/25/2017 16:28   Dg Abd Portable 1v  Result Date: 12/26/2017 CLINICAL DATA:  Acute generalized abdominal pain. EXAM: PORTABLE ABDOMEN - 1 VIEW COMPARISON:  None. FINDINGS: The bowel gas pattern is normal. Distal tip of nasogastric tube is seen in expected position of the stomach. No radio-opaque calculi or other significant radiographic abnormality are seen. IMPRESSION: Distal  tip of nasogastric tube seen in expected position of the stomach. No evidence of bowel obstruction or ileus. Electronically Signed   By: Marijo Conception, M.D.   On: 12/26/2017 12:38   Ct Angio Chest/abd/pel For Dissection W And/or Wo Contrast  Result Date: 12/25/2017 CLINICAL DATA:  Witnessed cardiac arrest, patient clutched chest and went down to ground, was pulseless, CPR initiated, history hypertension EXAM: CT ANGIOGRAPHY CHEST, ABDOMEN AND PELVIS TECHNIQUE: Multidetector CT imaging through the chest, abdomen and pelvis was performed using the standard protocol during bolus administration of intravenous contrast. Multiplanar reconstructed images and MIPs were obtained and reviewed to evaluate the vascular anatomy. CONTRAST:  100 ML ISOVUE-370 IOPAMIDOL (ISOVUE-370) INJECTION 76% IV COMPARISON:  02/02/2003 FINDINGS: CTA CHEST FINDINGS Cardiovascular: Aorta normal caliber. No calcified atherosclerotic plaque identified. No intramural hematoma on precontrast imaging. Normal aortic enhancement post contrast without evidence of aneurysm or dissection. Pulmonary arteries grossly patent on non targeted exam. No evidence of pulmonary embolism. No pericardial effusion. Mediastinum/Nodes: Nasogastric tube traverses esophagus into stomach. Esophagus normal appearance. Base of cervical region unremarkable. No thoracic adenopathy. Lungs/Pleura: Dependent atelectasis in the posterior lungs bilaterally. No pulmonary infiltrate, pleural effusion or pneumothorax. Musculoskeletal: No definite fractures. Review of the MIP images confirms the above findings. CTA ABDOMEN AND PELVIS FINDINGS VASCULAR Aorta: Aorta patent and normal caliber.  No aneurysm or dissection. Celiac: Widely patent SMA: Widely patent Renals: Widely patent IMA: Widely patent Inflow: Widely patent normal appearing  iliac systems bilaterally Veins: Unopacified and suboptimally assessed on CTA imaging Review of the MIP images confirms the above findings.  NON-VASCULAR Hepatobiliary: Streak artifacts from patient's arms traverse upper abdomen. Gallbladder and liver otherwise normal appearance. Pancreas: Normal appearance Spleen: Normal appearance Adrenals/Urinary Tract: Adrenal glands, kidneys, ureters, and bladder normal appearance Stomach/Bowel: Appendix surgically absent. Tip of nasogastric tube at distal gastric antrum. Stomach and bowel loops unremarkable for technique. Lymphatic: No adenopathy Reproductive: Unremarkable prostate gland Other: No free air or free fluid.  No hernia. Musculoskeletal: Normal appearance Review of the MIP images confirms the above findings. IMPRESSION: No evidence of aortic aneurysm or dissection. Pulmonary arteries grossly patent. Dependent atelectasis in the posterior lungs bilaterally. No no additional intrathoracic, intra-abdominal or intrapelvic abnormalities. Electronically Signed   By: Lavonia Dana M.D.   On: 12/25/2017 18:08       Subjective: Patient feeling well, improved abdominal pain, no nausea or vomiting. Reports somnolence. No nausea or vomiting.   Discharge Exam: Vitals:   12/28/17 0241 12/28/17 0546  BP: 106/65 117/68  Pulse: 80 80  Resp: 18 19  Temp: 98 F (36.7 C) 98.2 F (36.8 C)  SpO2: 97% 97%   Vitals:   12/27/17 1712 12/27/17 2139 12/28/17 0241 12/28/17 0546  BP: 127/75 130/89 106/65 117/68  Pulse: 95 88 80 80  Resp: 16 18 18 19   Temp: 98.5 F (36.9 C) 98.8 F (37.1 C) 98 F (36.7 C) 98.2 F (36.8 C)  TempSrc: Oral Oral Oral Oral  SpO2: 96% 92% 97% 97%  Weight:      Height:        General: Not in pain or dyspnea,  Neurology: Awake and alert, non focal  E ENT: no pallor, no icterus, oral mucosa moist Cardiovascular: No JVD. S1-S2 present, rhythmic, no gallops, rubs, or murmurs. No lower extremity edema. Pulmonary: vesicular breath sounds bilaterally, adequate air movement, no wheezing, rhonchi or rales. Gastrointestinal. Abdomen protuberant, no organomegaly, non tender, no  rebound or guarding. Positive abdominal hernia.  Skin. No rashes Musculoskeletal: no joint deformities   The results of significant diagnostics from this hospitalization (including imaging, microbiology, ancillary and laboratory) are listed below for reference.     Microbiology: Recent Results (from the past 240 hour(s))  Culture, blood (routine x 2)     Status: None (Preliminary result)   Collection Time: 12/25/17  9:30 PM  Result Value Ref Range Status   Specimen Description BLOOD LEFT ARM  Final   Special Requests   Final    BOTTLES DRAWN AEROBIC ONLY Blood Culture results may not be optimal due to an inadequate volume of blood received in culture bottles   Culture   Final    NO GROWTH 3 DAYS Performed at [facility], [place] [address], [place], [place] [address]    Report Status PENDING  Incomplete  Culture, blood (routine x 2)     Status: None (Preliminary result)   Collection Time: 12/25/17 11:55 PM  Result Value Ref Range Status   Specimen Description BLOOD RIGHT ARM  Final   Special Requests   Final    BOTTLES DRAWN AEROBIC AND ANAEROBIC Blood Culture adequate volume   Culture   Final    NO GROWTH 2 DAYS Performed at [facility], [place] [address] [place], [place] [address]    Report Status PENDING  Incomplete  MRSA PCR Screening     Status: None   Collection Time: 12/26/17  1:23 AM  Result Value Ref Range  Status   MRSA by PCR NEGATIVE NEGATIVE Final    Comment:        The GeneXpert MRSA Assay (FDA approved for NASAL specimens only), is one component of a comprehensive MRSA colonization surveillance program. It is not intended to diagnose MRSA infection nor to guide or monitor treatment for MRSA infections. Performed at [facility], [place] [address] [place], [place] [address]      Labs: BNP (last 3 results) No results for input(s): BNP in the last 8760 hours. Basic Metabolic Panel: Recent Labs  Lab 12/25/17 1646 12/26/17 0641  12/27/17 0518 12/28/17 0602  NA 139 139 140 139  K 4.3 3.5 3.6 3.4*  CL 104 107 107 106  CO2 24 21* 26 25  GLUCOSE 132* 94 110* 100*  BUN 14 10 6 9   CREATININE 1.20 1.06 1.08 1.12  CALCIUM 9.4 8.5* 8.4* 8.6*  MG  --  1.9 2.1 2.1  PHOS  --  3.3 3.4 3.1   Liver Function Tests: Recent Labs  Lab 12/25/17 1646  AST 28  ALT 32  ALKPHOS 92  BILITOT 0.5  PROT 7.2  ALBUMIN 4.2   No results for input(s): LIPASE, AMYLASE in the last 168 hours. No results for input(s): AMMONIA in the last 168 hours. CBC: Recent Labs  Lab 12/25/17 1646 12/26/17 0641 12/27/17 0518  WBC 7.0 8.0 7.0  HGB 15.6 13.2 13.1  HCT 48.8 39.9 40.2  MCV 96.1 94.3 94.4  PLT 173 150 137*   Cardiac Enzymes: Recent Labs  Lab 12/25/17 2353 12/26/17 0641  TROPONINI <0.03 <0.03   BNP: Invalid input(s): POCBNP CBG: Recent Labs  Lab 12/25/17 2124 12/26/17 0000 12/26/17 0338 12/26/17 1130 12/26/17 1545  GLUCAP 101* 83 92 89 88   D-Dimer No results for input(s): DDIMER in the last 72 hours. Hgb A1c No results for input(s): HGBA1C in the last 72 hours. Lipid Profile Recent Labs    12/27/17 0518  TRIG 405*   Thyroid function studies No results for input(s): TSH, T4TOTAL, T3FREE, THYROIDAB in the last 72 hours.  Invalid input(s): FREET3 Anemia work up No results for input(s): VITAMINB12, FOLATE, FERRITIN, TIBC, IRON, RETICCTPCT in the last 72 hours. Urinalysis    Component Value Date/Time   COLORURINE YELLOW 04/13/2017 2123   APPEARANCEUR CLEAR 04/13/2017 2123   LABSPEC 1.030 04/13/2017 2123   PHURINE 5.0 04/13/2017 2123   GLUCOSEU NEGATIVE 04/13/2017 2123   HGBUR NEGATIVE 04/13/2017 2123   BILIRUBINUR NEGATIVE 04/13/2017 2123   KETONESUR 5 (A) 04/13/2017 2123   PROTEINUR 30 (A) 04/13/2017 2123   UROBILINOGEN 1.0 08/15/2013 2011   NITRITE NEGATIVE 04/13/2017 2123   LEUKOCYTESUR TRACE (A) 04/13/2017 2123   Sepsis Labs Invalid input(s): PROCALCITONIN,  WBC,   LACTICIDVEN Microbiology Recent Results (from the past 240 hour(s))  Culture, blood (routine x 2)     Status: None (Preliminary result)   Collection Time: 12/25/17  9:30 PM  Result Value Ref Range Status   Specimen Description BLOOD LEFT ARM  Final   Special Requests   Final    BOTTLES DRAWN AEROBIC ONLY Blood Culture results may not be optimal due to an inadequate volume of blood received in culture bottles   Culture   Final    NO GROWTH 3 DAYS Performed at [facility], [place] [address], [place], [place] [address]    Report Status PENDING  Incomplete  Culture, blood (routine x 2)     Status: None (Preliminary result)  Collection Time: 12/25/17 11:55 PM  Result Value Ref Range Status   Specimen Description BLOOD RIGHT ARM  Final   Special Requests   Final    BOTTLES DRAWN AEROBIC AND ANAEROBIC Blood Culture adequate volume   Culture   Final    NO GROWTH 2 DAYS Performed at [facility], [address], [place],  [address]    Report Status PENDING  Incomplete  MRSA PCR Screening     Status: None   Collection Time: 12/26/17  1:23 AM  Result Value Ref Range Status   MRSA by PCR NEGATIVE NEGATIVE Final    Comment:        The GeneXpert MRSA Assay (FDA approved for NASAL specimens only), is one component of a comprehensive MRSA colonization surveillance program. It is not intended to diagnose MRSA infection nor to guide or monitor treatment for MRSA infections. Performed at [facility], [place] [address], [place],  [address]      Time coordinating discharge: 45 minutes  SIGNED:   Tawni Millers, MD  [facility] 12/28/2017, 9:55 AM Pager [phone]  If 7PM-7AM, please contact night-coverage [email or web address] Password [email or web address]

## 2017-12-30 LAB — CULTURE, BLOOD (ROUTINE X 2): CULTURE: NO GROWTH

## 2017-12-31 LAB — CULTURE, BLOOD (ROUTINE X 2)
CULTURE: NO GROWTH
Special Requests: ADEQUATE

## 2018-01-10 NOTE — Progress Notes (Signed)
Cardiology Office Note   Date:  01/14/2018   ID:  [name] [name], DOB 01-20-1981, MRN [id number]  PCP:  Leighton Ruff, MD  Cardiologist:  Wynonia Lawman (Needs to be reassigned). Dr.Croitoru  No chief complaint on file.    History of Present Illness: [name] [name] is a 37 y.o. male who presents for post hospital fallow up after suspected cardiac arrest. He has a history of Behcet's disease, Epilepsy, Bipolar disorder, GERD, s/p Nissen fundoplication, and OSA. He was admitted after having a period of unresponsiveness coming out of a swimming pool, He received CPR on site, was intubated when EMS arrived. It was felt this event was related to acute metabolic encephalopathy.   Echocardiogram revealed normal LV fx without wall motion abnormalities. He was seen by neurology and EEG was normal. Keppra dose was increased. He was continued psychotropic medications for Bi-Polar and continued on Apremilast and mycophenolate for Behcet's disease.  He has multiple complaints today, most involve lethargy and fatigue. He is easily out of energy with minimal activity. He states he begins to have mild chest discomfort and often stops activity when this occurs. He denies any further seizure activity or near syncope. No palpitations.   Past Medical History:  Diagnosis Date  . Behcet's disease ([place])   . Bipolar disorder ([place])   . Cardiac arrest ([place]) 12/25/2017   one round of CPR   . Hypertension   . Migraine   . Seizures ([place])    last experienced a seizure x 3 years ago  . Vitamin D deficiency     Past Surgical History:  Procedure Laterality Date  . APPENDECTOMY    . FRACTURE SURGERY Right    Arm  . maxilofacial    . NISSEN FUNDOPLICATION       Current Outpatient Medications  Medication Sig Dispense Refill  . acetaminophen (TYLENOL) 325 MG tablet Take 975 mg by mouth every 4 (four) hours as needed for moderate pain.    Marland Kitchen Apremilast 30 MG TABS Take 30 mg by mouth 2 (two) times daily.     .  clindamycin (CLEOCIN) 150 MG capsule Take 2 capsules (300 mg total) by mouth 3 (three) times daily for 7 days. 42 capsule 0  . clonazePAM (KLONOPIN) 0.5 MG tablet Take 0.5 mg by mouth 3 (three) times daily as needed for anxiety.    Marland Kitchen FLUoxetine (PROZAC) 20 MG/5ML solution Take 80 mg by mouth daily.     Marland Kitchen lamoTRIgine 200 MG TBDP Dissolve 200 mg in the mouth two times a day- morning and bedtime (Patient taking differently: Take 200 mg by mouth 2 (two) times daily. ) 180 tablet 3  . levETIRAcetam (KEPPRA) 100 MG/ML solution Take 10 mLs (1,000 mg total) by mouth 2 (two) times daily. 473 mL 0  . mycophenolate (CELLCEPT) 500 MG tablet Take 1,000 mg by mouth 2 (two) times daily.   2  . QUEtiapine (SEROQUEL) 200 MG tablet Take 200 mg by mouth at bedtime.     No current facility-administered medications for this visit.     Allergies:   Patient has no known allergies.    Social History:  The patient  reports that he has never smoked. He has never used smokeless tobacco. He reports that he does not drink alcohol or use drugs.   Family History:  The patient's family history includes Lung cancer in his paternal grandfather; Other in his father.    ROS: All other systems are reviewed and negative. Unless otherwise mentioned in H&P  PHYSICAL EXAM: VS:  BP 114/82   Pulse 90   Ht 6\' 1"  (1.854 m)   Wt 235 lb 9.6 oz (106.9 kg)   SpO2 99%   BMI 31.08 kg/m  , BMI Body mass index is 31.08 kg/m. GEN: Well nourished, well developed, in no acute distress HEENT: normal, dressing to occipital portion of skull.  Neck: no JVD, carotid bruits, or masses Cardiac: RRR; no murmurs, rubs, or gallops,no edema  Respiratory:  Clear to auscultation bilaterally, normal work of breathing GI: soft, nontender, nondistended, + BS MS: no deformity or atrophy Skin: warm and dry, no rash Neuro:  Strength and sensation are intact Psych: euthymic mood, full affect   EKG:  Not completed this office visit.   Recent  Labs: 12/25/2017: ALT 32 12/28/2017: Magnesium 2.1 01/12/2018: BUN 15; Creatinine, Ser 1.04; Hemoglobin 14.8; Platelets 204; Potassium 3.9; Sodium 145    Lipid Panel    Component Value Date/Time   TRIG 405 (H) 12/27/2017 0518      Wt Readings from Last 3 Encounters:  01/14/18 235 lb 9.6 oz (106.9 kg)  12/27/17 242 lb 11.6 oz (110.1 kg)  11/14/17 253 lb (114.8 kg)      Other studies Reviewed: Echocardiogram 2018/01/17  Left ventricle: The cavity size was normal. Wall thickness was normal. Systolic function was vigorous. The estimated ejection fraction was in the range of 65% to 70%. Wall motion was normal; there were no regional wall motion abnormalities. Doppler parameters are consistent with abnormal left ventricular relaxation (grade 1 diastolic dysfunction). The E/e&' ratio is between 8-15, suggesting indeterminate LV filling pressure. - Mitral valve: Mildly thickened leaflets . There was trivial regurgitation. - Left atrium: The atrium was normal in size. - Inferior vena cava: The vessel was normal in size. The respirophasic diameter changes were in the normal range (>= 50%), consistent with normal central venous pressure.  ASSESSMENT AND PLAN:  1. S/P cardiac arrest: He has had cardiac testing during recent hospitalization which was found to be normal This included an echo. Will place Zio heart monitor for 14 days to evaluate for bradycardia, arrhythmias, or pauses. No changes in his medical regimen.    2. Seizure disorder: Being followed by neurology. Keppra dose was increased. QT interval will need to be monitored closely on this medication. Some of his fatigue is likely from seizure medications.   Current medicines are reviewed at length with the patient today.    Labs/ tests ordered today include: Cardiac monitor 14 day.  Phill Myron. West Pugh, ANP, AACC   01/14/2018 9:08 AM    [place]  [address] Office [phone] Fax [phone]

## 2018-01-12 ENCOUNTER — Emergency Department (HOSPITAL_COMMUNITY)
Admission: EM | Admit: 2018-01-12 | Discharge: 2018-01-12 | Disposition: A | Discharge status: Home / Self Care | Payer: Medicare Other | Attending: Emergency Medicine | Admitting: Emergency Medicine

## 2018-01-12 ENCOUNTER — Emergency Department (HOSPITAL_COMMUNITY): Payer: Medicare Other

## 2018-01-12 ENCOUNTER — Encounter (HOSPITAL_COMMUNITY): Payer: Self-pay | Admitting: Emergency Medicine

## 2018-01-12 ENCOUNTER — Other Ambulatory Visit: Payer: Self-pay

## 2018-01-12 DIAGNOSIS — I10 Essential (primary) hypertension: Secondary | ICD-10-CM | POA: Diagnosis not present

## 2018-01-12 DIAGNOSIS — L0291 Cutaneous abscess, unspecified: Secondary | ICD-10-CM

## 2018-01-12 DIAGNOSIS — L03811 Cellulitis of head [any part, except face]: Secondary | ICD-10-CM | POA: Diagnosis not present

## 2018-01-12 DIAGNOSIS — I252 Old myocardial infarction: Secondary | ICD-10-CM | POA: Diagnosis not present

## 2018-01-12 DIAGNOSIS — Z79899 Other long term (current) drug therapy: Secondary | ICD-10-CM | POA: Insufficient documentation

## 2018-01-12 DIAGNOSIS — R51 Headache: Secondary | ICD-10-CM | POA: Insufficient documentation

## 2018-01-12 LAB — CBC WITH DIFFERENTIAL/PLATELET
ABS IMMATURE GRANULOCYTES: 0.04 10*3/uL (ref 0.00–0.07)
BASOS PCT: 1 %
Basophils Absolute: 0 10*3/uL (ref 0.0–0.1)
EOS ABS: 0.2 10*3/uL (ref 0.0–0.5)
EOS PCT: 3 %
HCT: 45.4 % (ref 39.0–52.0)
Hemoglobin: 14.8 g/dL (ref 13.0–17.0)
Immature Granulocytes: 1 %
Lymphocytes Relative: 16 %
Lymphs Abs: 1.1 10*3/uL (ref 0.7–4.0)
MCH: 30.8 pg (ref 26.0–34.0)
MCHC: 32.6 g/dL (ref 30.0–36.0)
MCV: 94.4 fL (ref 80.0–100.0)
MONO ABS: 0.5 10*3/uL (ref 0.1–1.0)
MONOS PCT: 8 %
NEUTROS ABS: 4.7 10*3/uL (ref 1.7–7.7)
Neutrophils Relative %: 71 %
PLATELETS: 204 10*3/uL (ref 150–400)
RBC: 4.81 MIL/uL (ref 4.22–5.81)
RDW: 11.9 % (ref 11.5–15.5)
WBC: 6.5 10*3/uL (ref 4.0–10.5)
nRBC: 0 % (ref 0.0–0.2)

## 2018-01-12 LAB — BASIC METABOLIC PANEL
Anion gap: 10 (ref 5–15)
BUN: 15 mg/dL (ref 6–20)
CHLORIDE: 108 mmol/L (ref 98–111)
CO2: 27 mmol/L (ref 22–32)
CREATININE: 1.04 mg/dL (ref 0.61–1.24)
Calcium: 9.9 mg/dL (ref 8.9–10.3)
GFR calc Af Amer: >60 mL/min (ref >60)
Glucose, Bld: 98 mg/dL (ref 70–99)
Potassium: 3.9 mmol/L (ref 3.5–5.1)
Sodium: 145 mmol/L (ref 135–145)

## 2018-01-12 MED ORDER — CLINDAMYCIN HCL 150 MG PO CAPS: 300.0000 mg | ORAL_CAPSULE | Freq: Three times a day (TID) | ORAL | 0 refills | Status: AC

## 2018-01-12 MED ORDER — SODIUM CHLORIDE 0.9 % IJ SOLN
INTRAMUSCULAR | Status: AC
  Filled 2018-01-12: qty 50

## 2018-01-12 MED ORDER — LIDOCAINE-EPINEPHRINE (PF) 2 %-1:200000 IJ SOLN
INTRAMUSCULAR | Status: AC
  Administered 2018-01-12: 20 mL
  Filled 2018-01-12: qty 20

## 2018-01-12 MED ORDER — LIDOCAINE-EPINEPHRINE 2 %-1:100000 IJ SOLN
10.0000 mL | Freq: Once | INTRAMUSCULAR | Status: DC
  Filled 2018-01-12: qty 10

## 2018-01-12 MED ORDER — IOPAMIDOL (ISOVUE-300) INJECTION 61%
75.0000 mL | Freq: Once | INTRAVENOUS | Status: AC | PRN
  Administered 2018-01-12: 100 mL via INTRAVENOUS

## 2018-01-12 MED ORDER — IOPAMIDOL (ISOVUE-300) INJECTION 61%
INTRAVENOUS | Status: AC
  Filled 2018-01-12: qty 100

## 2018-01-12 NOTE — ED Notes (Signed)
ED Provider at bedside. 

## 2018-01-12 NOTE — ED Triage Notes (Signed)
Patient reports admission post cardiac arrest x2 weeks ago at [facility]. States during arrest he hit posterior head causing wound. C/o drainage and increased pain from wound x3 days.

## 2018-01-12 NOTE — ED Provider Notes (Signed)
[facility] DEPT Provider Note   CSN: [id number] Arrival date & time: 01/12/18  1214     History   Chief Complaint Chief Complaint  Patient presents with  . Wound Infection    HPI PLES TRUDEL is a 37 y.o. male.  HPI   37 year old male with a history of Behcet's, bipolar, seizures, with possible seizure versus cardiac arrest 3 weeks ago who suffered a head wound at that time, presents with concern for increasing scalp pain and purulent drainage.  Patient reports over the last 3 days he is developed worsening pain surrounding his scalp wound.  Reports that the pain radiates from the area of the wounds, more inferiorly, and is severe in nature.  Reports yellow drainage.  Denies fevers.  Reports the pain feels "deep"  Past Medical History:  Diagnosis Date  . Behcet's disease ([place])   . Bipolar disorder ([place])   . Cardiac arrest ([place]) 12/25/2017   one round of CPR   . Hypertension   . Migraine   . Seizures ([place])    last experienced a seizure x 3 years ago  . Vitamin D deficiency     Patient Active Problem List   Diagnosis Date Noted  . Cardiac arrest ([place]) 12/25/2017  . Intubation of airway performed without difficulty   . Lactic acidosis   . Acute respiratory failure with hypoxia ([place])   . Seizures ([place])   . Post-ictal coma ([place]) 08/03/2016  . Acute respiratory failure with hypoxemia ([place])   . Seizure ([place])   . Altered mental status 07/07/2016  . Peripheral vertigo 07/17/2015  . Vertigo 07/17/2015  . Intractable vomiting 07/17/2015  . Diplopia   . Cellulitis and abscess of trunk-right chest wall 05/08/2013  . Dehydration 02/15/2013  . Behcet's disease ([place]) 02/15/2013  . Recurrent vomiting 02/15/2013  . Encounter for therapeutic drug monitoring 06/14/2012  . History of oral aphthous ulcers 10/19/2010  . BIPOLAR DISORDER UNSPECIFIED 06/18/2008  . Essential hypertension 06/18/2008  . SYNCOPE AND COLLAPSE 06/18/2008  . Epilepsy ([place])  06/18/2008    Past Surgical History:  Procedure Laterality Date  . APPENDECTOMY    . FRACTURE SURGERY Right    Arm  . maxilofacial    . NISSEN FUNDOPLICATION          Home Medications    Prior to Admission medications   Medication Sig Start Date End Date Taking? Authorizing Provider  acetaminophen (TYLENOL) 325 MG tablet Take 975 mg by mouth every 4 (four) hours as needed for moderate pain.   Yes [provider]  Apremilast 30 MG TABS Take 30 mg by mouth 2 (two) times daily.  11/07/17  Yes [provider]  clonazePAM (KLONOPIN) 0.5 MG tablet Take 1 tablet (0.5 mg total) by mouth 3 (three) times daily. For anxiety Patient taking differently: Take 0.25 mg by mouth 3 (three) times daily.  02/16/13  Yes Robbie Lis, MD  FLUoxetine [facility]) 20 MG/5ML solution Take 80 mg by mouth daily.  11/27/16  Yes [provider]  lamoTRIgine 200 MG TBDP Dissolve 200 mg in the mouth two times a day- morning and bedtime Patient taking differently: Take 200 mg by mouth 2 (two) times daily.  10/29/17  Yes Kathrynn Ducking, MD  levETIRAcetam (KEPPRA) 100 MG/ML solution Take 10 mLs (1,000 mg total) by mouth 2 (two) times daily. 12/28/17 01/27/18 Yes Arrien, Jimmy Picket, MD  mycophenolate (CELLCEPT) 500 MG tablet Take 1,000 mg by mouth 2 (two) times daily.  03/20/17  Yes [provider]  QUEtiapine (SEROQUEL) 200 MG tablet Take 200 mg by mouth at bedtime.   Yes [provider]  clindamycin (CLEOCIN) 150 MG capsule Take 2 capsules (300 mg total) by mouth 3 (three) times daily for 7 days. 01/12/18 01/19/18  Gareth Morgan, MD  famotidine (PEPCID) 40 MG/5ML suspension Take 2.5 mLs (20 mg total) by mouth 2 (two) times daily. 12/28/17 01/27/18  Arrien, Jimmy Picket, MD    Family History Family History  Problem Relation Age of Onset  . Lung cancer Paternal Grandfather   . Other Father        BPPV    Social History Social History   Tobacco Use  .  Smoking status: Never Smoker  . Smokeless tobacco: Never Used  Substance Use Topics  . Alcohol use: No  . Drug use: No     Allergies   Patient has no known allergies.   Review of Systems Review of Systems  Constitutional: Negative for fever.  HENT: Negative for sore throat.   Eyes: Negative for visual disturbance.  Respiratory: Negative for shortness of breath.   Cardiovascular: Negative for chest pain.  Gastrointestinal: Negative for abdominal pain, nausea and vomiting.  Genitourinary: Negative for difficulty urinating.  Musculoskeletal: Negative for back pain and neck stiffness.  Skin: Negative for rash.  Neurological: Positive for headaches. Negative for syncope.     Physical Exam Updated Vital Signs BP 129/87   Pulse 89   Temp (!) 97.5 F (36.4 C) (Oral)   Resp 17   SpO2 98%   Physical Exam  Constitutional: He is oriented to person, place, and time. He appears well-developed and well-nourished. No distress.  HENT:  Head: Normocephalic.  Healing abrasion/wound occiput, surrounding erythema, open area in center with purulent drainage, tenderness extending towards base of scalp  Eyes: Conjunctivae and EOM are normal.  Neck: Normal range of motion.  Cardiovascular: Normal rate, regular rhythm, normal heart sounds and intact distal pulses. Exam reveals no gallop and no friction rub.  No murmur heard. Pulmonary/Chest: Effort normal and breath sounds normal. No respiratory distress. He has no wheezes. He has no rales.  Abdominal: Soft. He exhibits no distension. There is no tenderness. There is no guarding.  Musculoskeletal: He exhibits no edema.  Neurological: He is alert and oriented to person, place, and time.  Skin: Skin is warm and dry. He is not diaphoretic.  Nursing note and vitals reviewed.    ED Treatments / Results  Labs (all labs ordered are listed, but only abnormal results are displayed) Labs Reviewed  CBC WITH DIFFERENTIAL/PLATELET  BASIC  METABOLIC PANEL    EKG None  Radiology Ct Head W Or Wo Contrast  Result Date: 01/12/2018 CLINICAL DATA:  Fall, head injury 12/25/2017. Now with wound infection. Wound botulism EXAM: CT HEAD WITHOUT AND WITH CONTRAST TECHNIQUE: Contiguous axial images were obtained from the base of the skull through the vertex without and with intravenous contrast CONTRAST:  156mL ISOVUE-300 IOPAMIDOL (ISOVUE-300) INJECTION 61% COMPARISON:  CT head 12/25/2017 FINDINGS: Brain: No evidence of acute infarction, hemorrhage, hydrocephalus, extra-axial collection or mass lesion/mass effect. Normal enhancement postcontrast infusion. No intracranial abscess or mass. Vascular: Normal arterial flow voids.  Normal venous enhancement. Skull: Negative for fracture or bone erosion. Sinuses/Orbits: Negative Other: Posterior parietal scalp soft tissue swelling and enhancement extending from the skin to the aponeurosis. No soft tissue abscess. This is presumably the site of wound infection. No bony destruction. IMPRESSION: Negative CT brain without with contrast  Soft tissue edema and enhancement in the posterior parietal scalp on the right consistent with wound infection. No abscess or bone destruction. Electronically Signed   By: Franchot Gallo M.D.   On: 01/12/2018 14:49    Procedures .Marland KitchenIncision and Drainage Date/Time: 01/12/2018 6:08 PM Performed by: Gareth Morgan, MD Authorized by: Gareth Morgan, MD   Consent:    Consent obtained:  Verbal   Consent given by:  Patient   Risks discussed:  Bleeding, damage to other organs, infection, pain and incomplete drainage Location:    Type:  Abscess   Size:  2   Location:  Head   Head location:  Scalp Pre-procedure details:    Skin preparation:  Betadine Anesthesia (see MAR for exact dosages):    Anesthesia method:  Local infiltration Procedure type:    Complexity:  Simple Procedure details:    Incision types:  Single straight   Incision depth:  Dermal   Scalpel  blade:  11   Wound management:  Probed and deloculated   Drainage:  Purulent and bloody   Drainage amount:  Moderate   Wound treatment:  Wound left open   Packing materials:  None Post-procedure details:    Patient tolerance of procedure:  Tolerated well, no immediate complications   (including critical care time)  Medications Ordered in ED Medications  iopamidol (ISOVUE-300) 61 % injection (has no administration in time range)  sodium chloride 0.9 % injection (has no administration in time range)  lidocaine-EPINEPHrine (XYLOCAINE W/EPI) 2 %-1:100000 (with pres) injection 10 mL (has no administration in time range)  iopamidol (ISOVUE-300) 61 % injection 75 mL (100 mLs Intravenous Contrast Given 01/12/18 1426)  lidocaine-EPINEPHrine (XYLOCAINE W/EPI) 2 %-1:200000 (PF) injection (20 mLs  Given by Other 01/12/18 1544)     Initial Impression / Assessment and Plan / ED Course  I have reviewed the triage vital signs and the nursing notes.  Pertinent labs & imaging results that were available during my care of the patient were reviewed by me and considered in my medical decision making (see chart for details).      37 year old male with a history of Behcet's, bipolar, seizures, with possible seizure versus cardiac arrest 3 weeks ago who suffered a head wound at that time, presents with concern for increasing scalp pain and purulent drainage.  CT head WWO contrast done to evaluate for possible subgaleal abscess and shows soft tissue edema and enhancement of posterior scalp.   Performed incision and drainage over area, recommend continued warm compresses and gave rx for clindamycin. Patient discharged in stable condition with understanding of reasons to return.       Final Clinical Impressions(s) / ED Diagnoses   Final diagnoses:  Cellulitis of head except face  Abscess    ED Discharge Orders         Ordered    clindamycin (CLEOCIN) 150 MG capsule  3 times daily     01/12/18  1535           Gareth Morgan, MD 01/12/18 1811

## 2018-01-14 ENCOUNTER — Ambulatory Visit (INDEPENDENT_AMBULATORY_CARE_PROVIDER_SITE_OTHER): Payer: Medicare Other | Admitting: Adult Health

## 2018-01-14 ENCOUNTER — Encounter: Payer: Self-pay | Admitting: Adult Health

## 2018-01-14 VITALS — BP 114/82 | HR 90 | Ht 73.0 in | Wt 235.6 lb

## 2018-01-14 DIAGNOSIS — R55 Syncope and collapse: Secondary | ICD-10-CM | POA: Diagnosis not present

## 2018-01-14 NOTE — Progress Notes (Signed)
Sounds like his cardiac problems are secondary to his other illnesses. MCr

## 2018-01-14 NOTE — Patient Instructions (Signed)
Medication Instructions:  NO CHANGES- Your physician recommends that you continue on your current medications as directed. Please refer to the Current Medication list given to you today.  If you need a refill on your cardiac medications before your next appointment, please call your pharmacy.    Testing/Procedures: Your physician has recommended that you wear a 14 DAY ZIO-PATCH monitor. The Zio patch cardiac monitor continuously records heart rhythm data for up to 14 days, this is for patients being evaluated for multiple types heart rhythms. For the first 24 hours post application, please avoid getting the Zio monitor wet in the shower or by excessive sweating during exercise. After that, feel free to carry on with regular activities. Keep soaps and lotions away from the ZIO XT Patch.  This will be placed at our [facility] location - [address].     Follow-Up: You will need a follow up appointment in [place].   You may see  DR Phineas Semen, DNP, ANP or one of the following Advanced Practice Providers on your designated Care Team.  At [facility], you and your health needs are our priority.  As part of our continuing mission to provide you with exceptional heart care, we have created designated Provider Care Teams.  These Care Teams include your primary Cardiologist (physician) and Advanced Practice Providers (APPs -  Physician Assistants and Nurse Practitioners) who all work together to provide you with the care you need, when you need it.  Labwork: If you have labs (blood work) drawn today and your tests are completely normal, you will receive your results only by: Marland Kitchen MyChart Message (if you have MyChart) OR . A paper copy in the mail If you have any lab test that is abnormal or we need to change your treatment, we will call you to review the results. Thank you for choosing [facility]!!

## 2018-01-16 ENCOUNTER — Ambulatory Visit (INDEPENDENT_AMBULATORY_CARE_PROVIDER_SITE_OTHER): Payer: Medicare Other | Admitting: Neurology

## 2018-01-16 ENCOUNTER — Encounter: Payer: Self-pay | Admitting: Neurology

## 2018-01-16 VITALS — BP 130/84 | HR 101 | Ht 73.0 in | Wt 237.0 lb

## 2018-01-16 DIAGNOSIS — Z5181 Encounter for therapeutic drug level monitoring: Secondary | ICD-10-CM

## 2018-01-16 DIAGNOSIS — G40909 Epilepsy, unspecified, not intractable, without status epilepticus: Secondary | ICD-10-CM | POA: Diagnosis not present

## 2018-01-16 NOTE — Progress Notes (Signed)
Reason for visit: Seizures  [name] [name] is an 37 y.o. male  History of present illness:  Mr. Pennella is a 37 year old right-handed white male with a history of Behcet's disease.  The patient was admitted to the hospital on 25 December 2017 with what appeared to be a cardiac arrest.  The patient was exercising in the swimming pool at the [facility].  He began to feel poorly, he developed some left-sided chest pain that went up into the shoulder and then up the neck into the left side of the head.  The patient got out of the pool, and he got down on his knee and then apparently lost consciousness, he fell backwards striking the back of his head.  Fortunately, the lifeguard on duty was an EMT, she started CPR.  The patient was noted to have asystole for greater than a minute and a half.  The patient did not have observable seizure activity.  The patient was intubated and brought to the hospital.  While intubated in the ICU, the patient was noted to have 3 brief seizure events, the patient however probably was not receiving Lamictal which cannot be given IV.  EEG evaluation in the hospital was normal.  The patient has recovered well, he is being followed by cardiology.  A coronary artery CT scan evaluation was relatively unremarkable.  The patient will be having a loop recorder placed in the near future to exclude a cardiac rhythm abnormality.  The patient claims that with exercise he begins to feel poorly, he will have shortness of breath and occasional episodes of chest pain.  He returns for an evaluation.  Past Medical History:  Diagnosis Date  . Behcet's disease ([place])   . Bipolar disorder ([place])   . Cardiac arrest ([employer]) 12/25/2017   one round of CPR   . Hypertension   . Migraine   . Seizures ([place])    last experienced a seizure x 3 years ago  . Vitamin D deficiency     Past Surgical History:  Procedure Laterality Date  . APPENDECTOMY    . FRACTURE SURGERY Right    Arm  . maxilofacial    .  NISSEN FUNDOPLICATION      Family History  Problem Relation Age of Onset  . Lung cancer Paternal Grandfather   . Other Father        BPPV    Social history:  reports that he has never smoked. He has never used smokeless tobacco. He reports that he does not drink alcohol or use drugs.   No Known Allergies  Medications:  Prior to Admission medications   Medication Sig Start Date End Date Taking? Authorizing Provider  acetaminophen (TYLENOL) 325 MG tablet Take 975 mg by mouth every 4 (four) hours as needed for moderate pain.   Yes [provider]  Apremilast 30 MG TABS Take 30 mg by mouth 2 (two) times daily.  11/07/17  Yes [provider]  clindamycin (CLEOCIN) 150 MG capsule Take 2 capsules (300 mg total) by mouth 3 (three) times daily for 7 days. 01/12/18 01/19/18 Yes Gareth Morgan, MD  clonazePAM (KLONOPIN) 0.5 MG tablet Take 0.5 mg by mouth 3 (three) times daily as needed for anxiety.   Yes [provider]  FLUoxetine (PROZAC) 20 MG/5ML solution Take 80 mg by mouth daily.  11/27/16  Yes [provider]  lamoTRIgine 200 MG TBDP Dissolve 200 mg in the mouth two times a day- morning and bedtime Patient taking differently:  Take 200 mg by mouth 2 (two) times daily.  10/29/17  Yes Kathrynn Ducking, MD  levETIRAcetam (KEPPRA) 100 MG/ML solution Take 10 mLs (1,000 mg total) by mouth 2 (two) times daily. 12/28/17 01/27/18 Yes Arrien, Jimmy Picket, MD  mycophenolate (CELLCEPT) 500 MG tablet Take 1,000 mg by mouth 2 (two) times daily.  03/20/17  Yes [provider]  QUEtiapine (SEROQUEL) 200 MG tablet Take 200 mg by mouth at bedtime.   Yes [provider]    ROS:  Out of a complete 14 system review of symptoms, the patient complains only of the following symptoms, and all other reviewed systems are negative.  Fatigue Shortness of breath Swollen abdomen Sleep apnea on CPAP Seizures Depression, anxiety Skin wounds  Blood pressure  130/84, pulse (!) 101, height 6\' 1"  (1.854 m), weight 237 lb (107.5 kg).  Physical Exam  General: The patient is alert and cooperative at the time of the examination.  Skin: No significant peripheral edema is noted.   Neurologic Exam  Mental status: The patient is alert and oriented x 3 at the time of the examination. The patient has apparent normal recent and remote memory, with an apparently normal attention span and concentration ability.   Cranial nerves: Facial symmetry is present. Speech is normal, no aphasia or dysarthria is noted. Extraocular movements are full. Visual fields are full.  Motor: The patient has good strength in all 4 extremities.  Sensory examination: Soft touch sensation is symmetric on the face, arms, and legs.  Coordination: The patient has good finger-nose-finger and heel-to-shin bilaterally.  Gait and station: The patient has a normal gait. Tandem gait is normal. Romberg is negative. No drift is seen.  Reflexes: Deep tendon reflexes are symmetric.   Assessment/Plan:  1.  History of seizures  2.  Behcet's disease  3.  Recent cardiac arrest  It appears that the event leading to the hospitalization probably was not a seizure.  The seizures that occurred in the hospital may have been because that Lamictal could not be administered IV.  The patient will have blood work done to check Keppra and Lamictal levels.  The patient will follow-up for his next scheduled visit in February 2020.  He is currently followed through cardiology.  Jill Alexanders MD 01/16/2018 5:04 PM  [facility] [address] [place], [place] [address]  Phone [phone] Fax [phone]

## 2018-01-17 ENCOUNTER — Other Ambulatory Visit (INDEPENDENT_AMBULATORY_CARE_PROVIDER_SITE_OTHER): Payer: Self-pay

## 2018-01-17 DIAGNOSIS — Z0289 Encounter for other administrative examinations: Secondary | ICD-10-CM

## 2018-01-17 NOTE — Addendum Note (Signed)
Addended by: Inis Sizer D on: 01/17/2018 01:31 PM   Modules accepted: Orders

## 2018-01-20 LAB — LAMOTRIGINE LEVEL: Lamotrigine Lvl: 4 ug/mL (ref 2.0–20.0)

## 2018-01-20 LAB — LEVETIRACETAM LEVEL: Levetiracetam Lvl: 38.3 ug/mL (ref 10.0–40.0)

## 2018-01-22 ENCOUNTER — Ambulatory Visit (INDEPENDENT_AMBULATORY_CARE_PROVIDER_SITE_OTHER): Payer: Medicare Other

## 2018-01-22 DIAGNOSIS — R55 Syncope and collapse: Secondary | ICD-10-CM

## 2018-02-13 NOTE — Progress Notes (Signed)
Cardiology Office Note   Date:  02/13/2018   ID:  [name], DOB 09/30/1980, MRN [id number]  PCP:  Leighton Ruff, MD  Cardiologist: Dr. Wynonia Lawman ([place] until his return) No chief complaint on file.    History of Present Illness: [name] [name] is a 37 y.o. male who presents for ongoing assessment and management of syncopal episode and recent cardiac arrest requiring hospitalization. He had normal cardiac work up. He has hx of seizure disorder. He is followed by neurology for this. On last office visit a cardiac monitor was placed to evaluate for arrhythmias causing this episode.   Monitor results per Dr. Recardo Evangelist Normal tracings on monitor. During the symptoms reported, the rhythm was mild sinus tachycardia (the normal adaptive rhythm that occurs during exercise, illness, pain, dehydration, etc).  Other history include Behcet's disease, bipolar disorder, and vitamin D deficiency.   He continues to feel badly when he exercises Near syncope, feelings of profound fatigue. He denies any chest pain or severe dyspnea. He has sleep apnea and is compliant with his CPAP. He is on multiple medications for bipolar.   Past Medical History:  Diagnosis Date  . Behcet's disease ([place])   . Bipolar disorder ([place])   . Cardiac arrest ([place]) 12/25/2017   one round of CPR   . Hypertension   . Migraine   . Seizures ([place])    last experienced a seizure x 3 years ago  . Vitamin D deficiency     Past Surgical History:  Procedure Laterality Date  . APPENDECTOMY    . FRACTURE SURGERY Right    Arm  . maxilofacial    . NISSEN FUNDOPLICATION       Current Outpatient Medications  Medication Sig Dispense Refill  . acetaminophen (TYLENOL) 325 MG tablet Take 975 mg by mouth every 4 (four) hours as needed for moderate pain.    Marland Kitchen Apremilast 30 MG TABS Take 30 mg by mouth 2 (two) times daily.     . clonazePAM (KLONOPIN) 0.5 MG tablet Take 0.5 mg by mouth 3 (three) times daily as needed for anxiety.      Marland Kitchen FLUoxetine (PROZAC) 20 MG/5ML solution Take 80 mg by mouth daily.     Marland Kitchen lamoTRIgine 200 MG TBDP Dissolve 200 mg in the mouth two times a day- morning and bedtime (Patient taking differently: Take 200 mg by mouth 2 (two) times daily. ) 180 tablet 3  . levETIRAcetam (KEPPRA) 100 MG/ML solution Take 10 mLs (1,000 mg total) by mouth 2 (two) times daily. 473 mL 0  . mycophenolate (CELLCEPT) 500 MG tablet Take 1,000 mg by mouth 2 (two) times daily.   2  . QUEtiapine (SEROQUEL) 200 MG tablet Take 200 mg by mouth at bedtime.     No current facility-administered medications for this visit.     Allergies:   Patient has no known allergies.    Social History:  The patient  reports that he has never smoked. He has never used smokeless tobacco. He reports that he does not drink alcohol or use drugs.   Family History:  The patient's family history includes Lung cancer in his paternal grandfather; Other in his father.    ROS: All other systems are reviewed and negative. Unless otherwise mentioned in H&P    PHYSICAL EXAM: VS:  There were no vitals taken for this visit. , BMI There is no height or weight on file to calculate BMI. GEN: Well nourished, well developed, in no acute distress HEENT: normal  Neck: no JVD, carotid bruits, or masses Cardiac: RRR; no murmurs, rubs, or gallops,no edema  Respiratory:  Clear to auscultation bilaterally, normal work of breathing GI: soft, nontender, nondistended, + BS MS: no deformity or atrophy Skin: warm and dry, no rash Neuro:  Strength and sensation are intact Psych: euthymic mood, full affect  EKG: Not completed this office visit.   Recent Labs: 12/25/2017: ALT 32 12/28/2017: Magnesium 2.1 01/12/2018: BUN 15; Creatinine, Ser 1.04; Hemoglobin 14.8; Platelets 204; Potassium 3.9; Sodium 145    Lipid Panel    Component Value Date/Time   TRIG 405 (H) 12/27/2017 0518      Wt Readings from Last 3 Encounters:  01/16/18 237 lb (107.5 kg)  01/14/18 235  lb 9.6 oz (106.9 kg)  12/27/17 242 lb 11.6 oz (110.1 kg)      Other studies Reviewed: Echocardiogram 01/25/18 Left ventricle: The cavity size was normal. Wall thickness was   normal. Systolic function was vigorous. The estimated ejection   fraction was in the range of 65% to 70%. Wall motion was normal;   there were no regional wall motion abnormalities. Doppler   parameters are consistent with abnormal left ventricular   relaxation (grade 1 diastolic dysfunction). The E/e&' ratio is   between 8-15, suggesting indeterminate LV filling pressure. - Mitral valve: Mildly thickened leaflets . There was trivial   regurgitation. - Left atrium: The atrium was normal in size. - Inferior vena cava: The vessel was normal in size. The   respirophasic diameter changes were in the normal range (>= 50%),   consistent with normal central venous pressure.  Impressions:  - LVEF 65-70%, normal wall thickness, normal regional wall motion,   grade 1 DD, indeterminate LV filling pressure, trivial MR, normal   LA size, normal IVC.  ASSESSMENT AND PLAN:  1. Syncope and collapse: Cardiac monitor revealed normal degree of HR fluctuations to include exercise and rest. He had no episodes of dizziness or near syncope during the time he wore the monitor. He states that when he exercises he really feels badly, sick, significant fatigue.  He is afraid to exert himself for fear of another episode. He is requesting a stress test to evaluate him during exercise to make sure he can return to exercise at a gym. This will be ordered POET, to give him reassurance and to evaluate for any arrhythmias that may pop up while he walks on the treadmill.   2  Hypertension: BP is controlled currently. No additions needed to control his BP or heart rate.   3.Bipolar: Multiple psychotropic medications. Defer to psychiatry.   4. Behcet's diease: Continue to see neurologist.    Current medicines are reviewed at length with the  patient today.    Labs/ tests ordered today include: POET.  Phill Myron. West Pugh, ANP, AACC   02/13/2018 4:36 PM    [place] [address] Office [phone] Fax [phone]

## 2018-02-14 ENCOUNTER — Encounter: Payer: Self-pay | Admitting: Adult Health

## 2018-02-14 ENCOUNTER — Ambulatory Visit (INDEPENDENT_AMBULATORY_CARE_PROVIDER_SITE_OTHER): Payer: Medicare Other | Admitting: Adult Health

## 2018-02-14 VITALS — BP 110/76 | HR 89 | Ht 73.0 in | Wt 240.0 lb

## 2018-02-14 DIAGNOSIS — R55 Syncope and collapse: Secondary | ICD-10-CM | POA: Diagnosis not present

## 2018-02-14 DIAGNOSIS — R42 Dizziness and giddiness: Secondary | ICD-10-CM | POA: Diagnosis not present

## 2018-02-14 DIAGNOSIS — F458 Other somatoform disorders: Secondary | ICD-10-CM

## 2018-02-14 NOTE — Progress Notes (Signed)
OK, will keep an eye out for the stress test

## 2018-02-14 NOTE — Patient Instructions (Signed)
Special Instructions: Your physician has requested that you have an exercise tolerance test, this is a screening tool to track your fitness level. This test evaluates the your exercise capacity by measuring cardiovascular response to exercise, the stress response is induced by exercise (exercise-treadmill).  Graded exercise test is also known as maximal exercise test or stress EKG test  . Please also follow instruction sheet given.  Follow-Up: You will need a follow up appointment in [place].  You may see  DR Phineas Semen, DNP, ANP -or- one of the following Advanced Practice Providers on your designated Care Team:    . Almyra Deforest, PA-C . Fabian Sharp, PA-C  Medication Instructions:  NO CHANGES- Your physician recommends that you continue on your current medications as directed. Please refer to the Current Medication list given to you today.  If you need a refill on your cardiac medications before your next appointment, please call your pharmacy.  Labwork: If you have labs (blood work) drawn today and your tests are completely normal, you will receive your results ONLY by: . MyChart Message (if you have MyChart) -OR- . A paper copy in the mail  At [facility], you and your health needs are our priority.  As part of our continuing mission to provide you with exceptional heart care, we have created designated Provider Care Teams.  These Care Teams include your primary Cardiologist (physician) and Advanced Practice Providers (APPs -  Physician Assistants and Nurse Practitioners) who all work together to provide you with the care you need, when you need it.  Thank you for choosing [facility] at [facility]!!

## 2018-02-15 ENCOUNTER — Other Ambulatory Visit: Payer: Self-pay | Admitting: Neurology

## 2018-02-19 ENCOUNTER — Telehealth (HOSPITAL_COMMUNITY): Payer: Self-pay

## 2018-02-19 NOTE — Telephone Encounter (Signed)
Encounter complete. 

## 2018-02-21 ENCOUNTER — Encounter: Payer: Self-pay | Admitting: Cardiovascular Disease

## 2018-02-21 ENCOUNTER — Ambulatory Visit (HOSPITAL_COMMUNITY)
Admission: RE | Admit: 2018-02-21 | Payer: Medicare Other | Source: Ambulatory Visit | Attending: Adult Health | Admitting: Adult Health

## 2018-02-21 ENCOUNTER — Other Ambulatory Visit: Payer: Self-pay | Admitting: *Deleted

## 2018-02-21 ENCOUNTER — Encounter (HOSPITAL_COMMUNITY): Payer: Self-pay

## 2018-02-21 DIAGNOSIS — R55 Syncope and collapse: Secondary | ICD-10-CM

## 2018-02-21 DIAGNOSIS — R9431 Abnormal electrocardiogram [ECG] [EKG]: Secondary | ICD-10-CM

## 2018-02-22 ENCOUNTER — Ambulatory Visit (HOSPITAL_COMMUNITY)
Admission: RE | Admit: 2018-02-22 | Discharge: 2018-02-22 | Disposition: A | Discharge status: Home / Self Care | Payer: Medicare Other | Source: Ambulatory Visit | Attending: Cardiology | Admitting: Cardiology

## 2018-02-22 ENCOUNTER — Encounter (HOSPITAL_COMMUNITY): Payer: Self-pay | Admitting: *Deleted

## 2018-02-22 DIAGNOSIS — R9431 Abnormal electrocardiogram [ECG] [EKG]: Secondary | ICD-10-CM | POA: Insufficient documentation

## 2018-02-22 DIAGNOSIS — R55 Syncope and collapse: Secondary | ICD-10-CM | POA: Diagnosis not present

## 2018-02-22 LAB — MYOCARDIAL PERFUSION IMAGING
LVDIAVOL: 86 mL (ref 62–150)
LVSYSVOL: 31 mL
NUC STRESS TID: 1.04
Peak HR: 134 {beats}/min
Rest HR: 109 {beats}/min
SDS: 2
SRS: 1
SSS: 3

## 2018-02-22 MED ORDER — REGADENOSON 0.4 MG/5ML IV SOLN
0.4000 mg | Freq: Once | INTRAVENOUS | Status: AC
  Administered 2018-02-22: 0.4 mg via INTRAVENOUS

## 2018-02-22 MED ORDER — TECHNETIUM TC 99M TETROFOSMIN IV KIT
10.3000 | PACK | Freq: Once | INTRAVENOUS | Status: AC | PRN
  Administered 2018-02-22: 10.3 via INTRAVENOUS
  Filled 2018-02-22: qty 11

## 2018-02-22 MED ORDER — TECHNETIUM TC 99M TETROFOSMIN IV KIT
30.2000 | PACK | Freq: Once | INTRAVENOUS | Status: AC | PRN
  Administered 2018-02-22: 30.2 via INTRAVENOUS
  Filled 2018-02-22: qty 31

## 2018-02-22 NOTE — Progress Notes (Signed)
Pt developed CP post imaging. Symptoms resolved. MM-40 BP-144/86 Dr Claiborne Billings reviewed Myoview study. Ok d/c pt home.

## 2018-02-23 NOTE — Progress Notes (Signed)
Cardiology Office Note   Date:  02/25/2018   ID:  [name] [name], DOB 1981-01-21, MRN [id number]  PCP:  Leighton Ruff, MD  Cardiologist: Dr. Wynonia Lawman (Dr. Sallyanne Kuster until his return) Chief Complaint  Patient presents with  . Follow-up     History of Present Illness: [name] [name] is a 37 y.o. male who presents for ongoing assessment and management of syncopal episode and recent cardiac arrest requiring hospitalization.  He had normal cardiac work-up.  With history of seizure disorder he is followed by neurology for this.  Cardiac monitor had been placed on prior office visit to evaluate for arrhythmias causing episodes.  This revealed normal tracings on the monitor, he had mild sinus tachycardia normal during exercise.  The patient admitted he was working out when his heart rate went up.  He states that he feels really badly when he does as if his energy leaves him.  Echocardiogram on 12/26/2017 revealed normal LV systolic function, grade 1 diastolic dysfunction, no valvular abnormalities concerning regurgitation or stenosis.  The patient was insistent that he felt very badly when working out and therefore a stress test was ordered to evaluate his response to exercise.  Nuclear medicine stress test on 02/22/2018 was normal, low risk study, no evidence of ischemia.   He states that he feels well and is without new complaints.   Past Medical History:  Diagnosis Date  . Behcet's disease ([place])   . Bipolar disorder ([place])   . Cardiac arrest ([place]) 12/25/2017   one round of CPR   . Hypertension   . Migraine   . Seizures ([place])    last experienced a seizure x 3 years ago  . Vitamin D deficiency     Past Surgical History:  Procedure Laterality Date  . APPENDECTOMY    . FRACTURE SURGERY Right    Arm  . maxilofacial    . NISSEN FUNDOPLICATION       Current Outpatient Medications  Medication Sig Dispense Refill  . Apremilast (OTEZLA) 30 MG TABS Take 1 tablet by mouth 2 (two) times daily.     . clonazePAM (KLONOPIN) 0.5 MG tablet Take 0.5 mg by mouth 3 (three) times daily as needed for anxiety.    Marland Kitchen FLUoxetine (PROZAC) 20 MG/5ML solution Take 80 mg by mouth daily.     Marland Kitchen lamoTRIgine 200 MG TBDP Dissolve 200 mg in the mouth two times a day- morning and bedtime (Patient taking differently: Take 200 mg by mouth 2 (two) times daily. ) 180 tablet 3  . mycophenolate (CELLCEPT) 500 MG tablet Take 1,000 mg by mouth 2 (two) times daily.   2  . QUEtiapine (SEROQUEL) 200 MG tablet Take 200 mg by mouth at bedtime.    . levETIRAcetam (KEPPRA) 100 MG/ML solution Take 10 mLs (1,000 mg total) by mouth 2 (two) times daily. 473 mL 0   No current facility-administered medications for this visit.     Allergies:   Patient has no known allergies.    Social History:  The patient  reports that he has never smoked. He has never used smokeless tobacco. He reports that he does not drink alcohol or use drugs.   Family History:  The patient's family history includes Lung cancer in his paternal grandfather; Other in his father.    ROS: All other systems are reviewed and negative. Unless otherwise mentioned in H&P    PHYSICAL EXAM: VS:  BP 123/78   Pulse 78   Ht 6\' 1"  (1.854 m)  Wt 247 lb (112 kg)   BMI 32.59 kg/m  , BMI Body mass index is 32.59 kg/m. GEN: Well nourished, well developed, in no acute distress Cardiac: RRR; no murmurs, rubs, or gallops,no edema  Respiratory:  Clear to auscultation bilaterally, normal work of breathing Psych: euthymic mood, full affect   EKG: Not completed this office visit.   Recent Labs: 12/25/2017: ALT 32 12/28/2017: Magnesium 2.1 01/12/2018: BUN 15; Creatinine, Ser 1.04; Hemoglobin 14.8; Platelets 204; Potassium 3.9; Sodium 145    Lipid Panel    Component Value Date/Time   TRIG 405 (H) 12/27/2017 0518      Wt Readings from Last 3 Encounters:  02/25/18 247 lb (112 kg)  02/22/18 240 lb (108.9 kg)  02/14/18 240 lb (108.9 kg)     Other studies  Review Left ventricle: The cavity size was normal. Wall thickness was   normal. Systolic function was vigorous. The estimated ejection   fraction was in the range of 65% to 70%. Wall motion was normal;   there were no regional wall motion abnormalities. Doppler   parameters are consistent with abnormal left ventricular   relaxation (grade 1 diastolic dysfunction). The E/e&' ratio is   between 8-15, suggesting indeterminate LV filling pressure. - Mitral valve: Mildly thickened leaflets . There was trivial   regurgitation. - Left atrium: The atrium was normal in size. - Inferior vena cava: The vessel was normal in size. The   respirophasic diameter changes were in the normal range (>= 50%),   consistent with normal central venous pressure.  Impressions:  - LVEF 65-70%, normal wall thickness, normal regional wall motion,   grade 1 DD, indeterminate LV filling pressure, trivial MR, normal   LA size, normal IVC.  ASSESSMENT AND PLAN:  1. Atypical chest pain: NM Study and echo were normal. No further planned testing at this time. He will be seen prn.   2 Tachycardia: Normal response to exercise. No additional medications.   3. Behcet's Disease: Continue to see PCP for ongoing management.   4. Syncope and Collapse: No further episodes. Not cardiac related.  Follow neurology.    Current medicines are reviewed at length with the patient today.    Labs/ tests ordered today include: None  Phill Myron. West Pugh, ANP, [facility]   02/25/2018 8:28 AM    [place] [address] [place] Office [phone] Fax [phone]

## 2018-02-25 ENCOUNTER — Encounter: Payer: Self-pay | Admitting: Adult Health

## 2018-02-25 ENCOUNTER — Ambulatory Visit (INDEPENDENT_AMBULATORY_CARE_PROVIDER_SITE_OTHER): Payer: Medicare Other | Admitting: Adult Health

## 2018-02-25 VITALS — BP 123/78 | HR 78 | Ht 73.0 in | Wt 247.0 lb

## 2018-02-25 DIAGNOSIS — R0789 Other chest pain: Secondary | ICD-10-CM

## 2018-02-25 DIAGNOSIS — R000 Tachycardia, unspecified: Secondary | ICD-10-CM

## 2018-02-25 DIAGNOSIS — R55 Syncope and collapse: Secondary | ICD-10-CM

## 2018-02-25 NOTE — Patient Instructions (Signed)
Follow-Up: You will need a follow up appointment  AS NEEDED.   You may see  DR Wynonia Lawman ([place] until his return), Jory Sims, DNP, [place] -or- one of the Advanced Practice Providers on your designated Care Team:    Medication Instructions:  NO CHANGES- Your physician recommends that you continue on your current medications as directed. Please refer to the Current Medication list given to you today.  If you need a refill on your cardiac medications before your next appointment, please call your pharmacy.  Labwork: If you have labs (blood work) drawn today and your tests are completely normal, you will receive your results ONLY by: . MyChart Message (if you have MyChart) -OR- . A paper copy in the mail  At [facility], you and your health needs are our priority.  As part of our continuing mission to provide you with exceptional heart care, we have created designated Provider Care Teams.  These Care Teams include your primary Cardiologist (physician) and Advanced Practice Providers (APPs -  Physician Assistants and Nurse Practitioners) who all work together to provide you with the care you need, when you need it.  Thank you for choosing [facility] at [facility]!!

## 2018-02-25 NOTE — Progress Notes (Signed)
Thanks, MCr 

## 2018-02-27 ENCOUNTER — Encounter (HOSPITAL_COMMUNITY): Payer: Medicare Other

## 2018-05-28 ENCOUNTER — Ambulatory Visit (INDEPENDENT_AMBULATORY_CARE_PROVIDER_SITE_OTHER): Payer: Medicare Other | Admitting: Neurology

## 2018-05-28 ENCOUNTER — Encounter: Payer: Self-pay | Admitting: Neurology

## 2018-05-28 VITALS — BP 129/85 | HR 89 | Ht 73.0 in | Wt 236.0 lb

## 2018-05-28 DIAGNOSIS — Z5181 Encounter for therapeutic drug level monitoring: Secondary | ICD-10-CM | POA: Diagnosis not present

## 2018-05-28 DIAGNOSIS — G40909 Epilepsy, unspecified, not intractable, without status epilepticus: Secondary | ICD-10-CM | POA: Diagnosis not present

## 2018-05-28 DIAGNOSIS — M352 Behcet's disease: Secondary | ICD-10-CM | POA: Diagnosis not present

## 2018-05-28 MED ORDER — LAMOTRIGINE 200 MG PO TBDP: 200.0000 mg | ORAL_TABLET | Freq: Two times a day (BID) | ORAL | Status: DC

## 2018-05-28 NOTE — Progress Notes (Signed)
Reason for visit: Seizures, Behcet's disease  [name] [name] is an 38 y.o. male  History of present illness:  [name] [name] is a 38 year old right-handed white male with a history of Behcet's disease.  The patient has had intermittent breakouts of skin lesions associated with this disease, he may go on prednisone for this intermittently.  The patient has not had any seizures since last seen, he is tolerating the medications well, he is on Keppra and Lamictal.  The patient has not had any further syncopal episodes or episodes of cardiac arrest.  He is now followed through the [facility] cardiology for his heart issues.  He has not required a defibrillator.  The patient returns to this office for an evaluation.  Past Medical History:  Diagnosis Date  . Behcet's disease ([place])   . Bipolar disorder ([place])   . Cardiac arrest ([place]) 12/25/2017   one round of CPR   . Hypertension   . Migraine   . Seizures ([place])    last experienced a seizure x 3 years ago  . Vitamin D deficiency     Past Surgical History:  Procedure Laterality Date  . APPENDECTOMY    . FRACTURE SURGERY Right    Arm  . maxilofacial    . NISSEN FUNDOPLICATION      Family History  Problem Relation Age of Onset  . Lung cancer Paternal Grandfather   . Other Father        BPPV    Social history:  reports that he has never smoked. He has never used smokeless tobacco. He reports that he does not drink alcohol or use drugs.   No Known Allergies  Medications:  Prior to Admission medications   Medication Sig Start Date End Date Taking? Authorizing Provider  Apremilast (OTEZLA) 30 MG TABS Take 1 tablet by mouth 2 (two) times daily.   Yes [provider]  Cholecalciferol (VITAMIN D3 PO) Take 8,000 Units by mouth.   Yes [provider]  clonazePAM (KLONOPIN) 0.5 MG tablet Take 0.5 mg by mouth 3 (three) times daily as needed for anxiety.   Yes [provider]  FLUoxetine (PROZAC) 20 MG/5ML  solution Take 80 mg by mouth daily.  11/27/16  Yes [provider]  lamoTRIgine 200 MG TBDP Dissolve 200 mg in the mouth two times a day- morning and bedtime Patient taking differently: Take 200 mg by mouth 2 (two) times daily.  10/29/17  Yes Kathrynn Ducking, MD  mycophenolate (CELLCEPT) 500 MG tablet Take 1,000 mg by mouth 2 (two) times daily.  03/20/17  Yes [provider]  QUEtiapine (SEROQUEL) 200 MG tablet Take 200 mg by mouth at bedtime.   Yes [provider]  levETIRAcetam (KEPPRA) 100 MG/ML solution Take 10 mLs (1,000 mg total) by mouth 2 (two) times daily. 12/28/17 01/27/18  Arrien, Jimmy Picket, MD    ROS:  Out of a complete 14 system review of symptoms, the patient complains only of the following symptoms, and all other reviewed systems are negative.  Swollen abdomen, abdominal pain Joint pain, joint swelling Skin wounds, rash Depression  Blood pressure 129/85, pulse 89, height 6\' 1"  (1.854 m), weight 236 lb (107 kg).  Physical Exam  General: The patient is alert and cooperative at the time of the examination.  Skin: No significant peripheral edema is noted.   Neurologic Exam  Mental status: The patient is alert and oriented x 3 at the time of the examination. The patient  has apparent normal recent and remote memory, with an apparently normal attention span and concentration ability.   Cranial nerves: Facial symmetry is present. Speech is normal, no aphasia or dysarthria is noted. Extraocular movements are full. Visual fields are full.  Motor: The patient has good strength in all 4 extremities.  Sensory examination: Soft touch sensation is symmetric on the face, arms, and legs.  Coordination: The patient has good finger-nose-finger and heel-to-shin bilaterally.  Gait and station: The patient has a normal gait. Tandem gait is normal. Romberg is negative. No drift is seen.  Reflexes: Deep tendon reflexes are  symmetric.   Assessment/Plan:  1.  Behcet's disease  2.  Seizures  The patient is doing well currently, he has not had any recent seizure events.  The patient will be sent for blood work today, he will follow-up in 6 months.  He will call me if any concerns arise.   Greater than 50% of the visit was spent in counseling and coordination of care.  Face-to-face time with the patient was 20 minutes.   Jill Alexanders MD 05/28/2018 8:23 AM  [facility] [address] [place], [place] [address]  Phone [phone] Fax [phone]

## 2018-05-30 LAB — CBC WITH DIFFERENTIAL/PLATELET
BASOS: 1 %
Basophils Absolute: 0.1 10*3/uL (ref 0.0–0.2)
EOS (ABSOLUTE): 0.2 10*3/uL (ref 0.0–0.4)
EOS: 3 %
HEMATOCRIT: 44.6 % (ref 37.5–51.0)
HEMOGLOBIN: 14.8 g/dL (ref 13.0–17.7)
Immature Grans (Abs): 0.1 10*3/uL (ref 0.0–0.1)
Immature Granulocytes: 1 %
Lymphocytes Absolute: 1.6 10*3/uL (ref 0.7–3.1)
Lymphs: 20 %
MCH: 30.6 pg (ref 26.6–33.0)
MCHC: 33.2 g/dL (ref 31.5–35.7)
MCV: 92 fL (ref 79–97)
Monocytes Absolute: 0.7 10*3/uL (ref 0.1–0.9)
Monocytes: 9 %
NEUTROS ABS: 5.1 10*3/uL (ref 1.4–7.0)
Neutrophils: 66 %
Platelets: 218 10*3/uL (ref 150–450)
RBC: 4.84 x10E6/uL (ref 4.14–5.80)
RDW: 12.4 % (ref 11.6–15.4)
WBC: 7.7 10*3/uL (ref 3.4–10.8)

## 2018-05-30 LAB — COMPREHENSIVE METABOLIC PANEL
ALBUMIN: 4.6 g/dL (ref 4.0–5.0)
ALK PHOS: 106 IU/L (ref 39–117)
ALT: 22 IU/L (ref 0–44)
AST: 13 IU/L (ref 0–40)
Albumin/Globulin Ratio: 2 (ref 1.2–2.2)
BUN/Creatinine Ratio: 13 (ref 9–20)
BUN: 14 mg/dL (ref 6–20)
Bilirubin Total: <0.2 mg/dL (ref 0.0–1.2)
CALCIUM: 9.6 mg/dL (ref 8.7–10.2)
CO2: 24 mmol/L (ref 20–29)
CREATININE: 1.04 mg/dL (ref 0.76–1.27)
Chloride: 102 mmol/L (ref 96–106)
GFR, EST AFRICAN AMERICAN: 104 mL/min/{1.73_m2} (ref >59)
GFR, EST NON AFRICAN AMERICAN: 90 mL/min/{1.73_m2} (ref >59)
GLUCOSE: 68 mg/dL (ref 65–99)
Globulin, Total: 2.3 g/dL (ref 1.5–4.5)
Potassium: 4.3 mmol/L (ref 3.5–5.2)
SODIUM: 141 mmol/L (ref 134–144)
Total Protein: 6.9 g/dL (ref 6.0–8.5)

## 2018-05-30 LAB — LEVETIRACETAM LEVEL: Levetiracetam Lvl: 42.5 ug/mL — ABNORMAL HIGH (ref 10.0–40.0)

## 2018-05-30 LAB — LAMOTRIGINE LEVEL: Lamotrigine Lvl: 2.8 ug/mL (ref 2.0–20.0)

## 2018-06-03 ENCOUNTER — Other Ambulatory Visit: Payer: Self-pay | Admitting: Neurology

## 2018-06-10 ENCOUNTER — Other Ambulatory Visit: Payer: Self-pay

## 2018-06-10 ENCOUNTER — Emergency Department (HOSPITAL_COMMUNITY): Payer: Medicare Other

## 2018-06-10 ENCOUNTER — Emergency Department (HOSPITAL_COMMUNITY)
Admission: EM | Admit: 2018-06-10 | Discharge: 2018-06-10 | Disposition: A | Discharge status: Home / Self Care | Payer: Medicare Other | Attending: Emergency Medicine | Admitting: Emergency Medicine

## 2018-06-10 ENCOUNTER — Encounter (HOSPITAL_COMMUNITY): Payer: Self-pay

## 2018-06-10 DIAGNOSIS — R69 Illness, unspecified: Secondary | ICD-10-CM

## 2018-06-10 DIAGNOSIS — Z79899 Other long term (current) drug therapy: Secondary | ICD-10-CM | POA: Diagnosis not present

## 2018-06-10 DIAGNOSIS — I10 Essential (primary) hypertension: Secondary | ICD-10-CM | POA: Diagnosis not present

## 2018-06-10 DIAGNOSIS — R059 Cough, unspecified: Secondary | ICD-10-CM

## 2018-06-10 DIAGNOSIS — R05 Cough: Secondary | ICD-10-CM

## 2018-06-10 DIAGNOSIS — J111 Influenza due to unidentified influenza virus with other respiratory manifestations: Secondary | ICD-10-CM | POA: Insufficient documentation

## 2018-06-10 LAB — BASIC METABOLIC PANEL
Anion gap: 8 (ref 5–15)
BUN: 14 mg/dL (ref 6–20)
CALCIUM: 9.6 mg/dL (ref 8.9–10.3)
CO2: 25 mmol/L (ref 22–32)
Chloride: 106 mmol/L (ref 98–111)
Creatinine, Ser: 0.99 mg/dL (ref 0.61–1.24)
GFR calc Af Amer: >60 mL/min (ref >60)
Glucose, Bld: 91 mg/dL (ref 70–99)
Potassium: 3.7 mmol/L (ref 3.5–5.1)
Sodium: 139 mmol/L (ref 135–145)

## 2018-06-10 LAB — CBC WITH DIFFERENTIAL/PLATELET
Abs Immature Granulocytes: 0.05 10*3/uL (ref 0.00–0.07)
BASOS ABS: 0.1 10*3/uL (ref 0.0–0.1)
Basophils Relative: 1 %
EOS PCT: 5 %
Eosinophils Absolute: 0.4 10*3/uL (ref 0.0–0.5)
HEMATOCRIT: 48.1 % (ref 39.0–52.0)
Hemoglobin: 15 g/dL (ref 13.0–17.0)
Immature Granulocytes: 1 %
LYMPHS ABS: 1.5 10*3/uL (ref 0.7–4.0)
Lymphocytes Relative: 17 %
MCH: 30.4 pg (ref 26.0–34.0)
MCHC: 31.2 g/dL (ref 30.0–36.0)
MCV: 97.6 fL (ref 80.0–100.0)
MONOS PCT: 7 %
Monocytes Absolute: 0.7 10*3/uL (ref 0.1–1.0)
Neutro Abs: 6.3 10*3/uL (ref 1.7–7.7)
Neutrophils Relative %: 69 %
Platelets: 208 10*3/uL (ref 150–400)
RBC: 4.93 MIL/uL (ref 4.22–5.81)
RDW: 12.2 % (ref 11.5–15.5)
WBC: 8.9 10*3/uL (ref 4.0–10.5)
nRBC: 0 % (ref 0.0–0.2)

## 2018-06-10 MED ORDER — BENZONATATE 100 MG PO CAPS: 100.0000 mg | ORAL_CAPSULE | Freq: Three times a day (TID) | ORAL | 0 refills | Status: DC | PRN

## 2018-06-10 MED ORDER — AZITHROMYCIN 250 MG PO TABS: 250.0000 mg | ORAL_TABLET | Freq: Every day | ORAL | 0 refills | Status: DC

## 2018-06-10 MED ORDER — OXYCODONE HCL 5 MG PO TABS: 5.0000 mg | ORAL_TABLET | ORAL | 0 refills | Status: DC | PRN

## 2018-06-10 MED ORDER — SODIUM CHLORIDE 0.9 % IV BOLUS
1000.0000 mL | Freq: Once | INTRAVENOUS | Status: AC
  Administered 2018-06-10: 1000 mL via INTRAVENOUS

## 2018-06-10 NOTE — ED Provider Notes (Signed)
[facility] DEPT Provider Note   CSN: [id number] Arrival date & time: 06/10/18  1328    History   Chief Complaint Chief Complaint  Patient presents with  . Cough  . Fever  . Generalized Body Aches    HPI [name] [name] is a 38 y.o. male.    [name] [name] is a 38 y.o. male  with a complex past medical history listed below including behcet's disease, previous cardiac arrest and seizure disorder who presents to the Emergency Department complaining of generalized body aches, fatigue, cough productive of greenish sputum for the last 3 days.  He does not feel any better despite over-the-counter cough and cold medications.  He denies any pain in his chest or trouble breathing.  No abdominal pain, nausea or vomiting.  He did get a flu shot, but states that since he is on CellCept, he was told that he was prone to getting infections.  The history is provided by the patient and medical records. No language interpreter was used.    Past Medical History:  Diagnosis Date  . Behcet's disease ([place])   . Bipolar disorder ([place])   . Cardiac arrest ([place]) 12/25/2017   one round of CPR   . Hypertension   . Migraine   . Seizures ([place])    last experienced a seizure x 3 years ago  . Vitamin D deficiency     Patient Active Problem List   Diagnosis Date Noted  . Cardiac arrest ([place]) 12/25/2017  . Intubation of airway performed without difficulty   . Lactic acidosis   . Acute respiratory failure with hypoxia ([place])   . Seizures ([place])   . Post-ictal coma ([place]) 08/03/2016  . Acute respiratory failure with hypoxemia ([place])   . Seizure ([place])   . Altered mental status 07/07/2016  . Peripheral vertigo 07/17/2015  . Vertigo 07/17/2015  . Intractable vomiting 07/17/2015  . Diplopia   . Cellulitis and abscess of trunk-right chest wall 05/08/2013  . Dehydration 02/15/2013  . Behcet's disease ([place]) 02/15/2013  . Recurrent vomiting 02/15/2013  . Encounter for therapeutic  drug monitoring 06/14/2012  . History of oral aphthous ulcers 10/19/2010  . BIPOLAR DISORDER UNSPECIFIED 06/18/2008  . Essential hypertension 06/18/2008  . SYNCOPE AND COLLAPSE 06/18/2008  . Epilepsy ([place]) 06/18/2008    Past Surgical History:  Procedure Laterality Date  . APPENDECTOMY    . FRACTURE SURGERY Right    Arm  . maxilofacial    . NISSEN FUNDOPLICATION          Home Medications    Prior to Admission medications   Medication Sig Start Date End Date Taking? Authorizing Provider  Apremilast (OTEZLA) 30 MG TABS Take 1 tablet by mouth 2 (two) times daily.    [provider]  Cholecalciferol (VITAMIN D3 PO) Take 8,000 Units by mouth.    [provider]  clonazePAM (KLONOPIN) 0.5 MG tablet Take 0.5 mg by mouth 3 (three) times daily as needed for anxiety.    [provider]  FLUoxetine (PROZAC) 20 MG/5ML solution Take 80 mg by mouth daily.  11/27/16   [provider]  lamoTRIgine 200 MG TBDP Take 1 tablet (200 mg total) by mouth 2 (two) times daily. 05/28/18   Kathrynn Ducking, MD  levETIRAcetam (KEPPRA) 100 MG/ML solution TAKE 7 ML(700 MG) BY MOUTH TWICE DAILY 06/03/18   Kathrynn Ducking, MD  mycophenolate (CELLCEPT) 500 MG tablet Take 1,000 mg by mouth 2 (two) times daily.  03/20/17  [provider]  QUEtiapine (SEROQUEL) 200 MG tablet Take 200 mg by mouth at bedtime.    [provider]    Family History Family History  Problem Relation Age of Onset  . Lung cancer Paternal Grandfather   . Other Father        BPPV    Social History Social History   Tobacco Use  . Smoking status: Never Smoker  . Smokeless tobacco: Never Used  Substance Use Topics  . Alcohol use: No  . Drug use: No     Allergies   Patient has no known allergies.   Review of Systems Review of Systems  Constitutional: Positive for chills and fever.  HENT: Positive for congestion. Negative for sore throat.   Respiratory: Positive for  cough.   Musculoskeletal: Positive for myalgias. Negative for arthralgias and neck pain.  All other systems reviewed and are negative.    Physical Exam Updated Vital Signs BP (!) 131/95   Pulse 95   Temp 98.3 F (36.8 C) (Oral)   Resp (!) 25   Ht 6' (1.829 m)   Wt 104.3 kg   SpO2 96%   BMI 31.19 kg/m   Physical Exam Vitals signs and nursing note reviewed.  Constitutional:      General: He is not in acute distress.    Appearance: He is well-developed.  HENT:     Head: Normocephalic and atraumatic.  Neck:     Musculoskeletal: Neck supple.  Cardiovascular:     Heart sounds: Normal heart sounds. No murmur.     Comments: Mildly tachycardic, but regular. Pulmonary:     Effort: Pulmonary effort is normal. No respiratory distress.     Breath sounds: Normal breath sounds.     Comments: Lungs clear to auscultation bilaterally. Abdominal:     General: There is no distension.     Palpations: Abdomen is soft.     Tenderness: There is no abdominal tenderness.  Skin:    General: Skin is warm and dry.  Neurological:     Mental Status: He is alert and oriented to person, place, and time.      ED Treatments / Results  Labs (all labs ordered are listed, but only abnormal results are displayed) Labs Reviewed  CBC WITH DIFFERENTIAL/PLATELET  BASIC METABOLIC PANEL    EKG EKG Interpretation  Date/Time:  Monday June 10 2018 15:28:28 EDT Ventricular Rate:  86 PR Interval:    QRS Duration: 95 QT Interval:  365 QTC Calculation: 437 R Axis:   42 Text Interpretation:  Sinus rhythm Low voltage, precordial leads Borderline T abnormalities, inferior leads Confirmed by Dene Gentry [phone]) on 06/10/2018 3:31:24 PM   Radiology Dg Chest 2 View  Result Date: 06/10/2018 CLINICAL DATA:  Productive cough.  Fever. EXAM: CHEST - 2 VIEW COMPARISON:  December 27, 2017 FINDINGS: The heart size and mediastinal contours are within normal limits. Both lungs are clear. The visualized skeletal  structures are unremarkable. IMPRESSION: No active cardiopulmonary disease. Electronically Signed   By: Dorise Bullion III M.D   On: 06/10/2018 16:03    Procedures Procedures (including critical care time)  Medications Ordered in ED Medications  sodium chloride 0.9 % bolus 1,000 mL (1,000 mLs Intravenous New Bag/Given (Non-Interop) 06/10/18 1524)     Initial Impression / Assessment and Plan / ED Course  I have reviewed the triage vital signs and the nursing notes.  Pertinent labs & imaging results that were available during my care of the patient were reviewed  by me and considered in my medical decision making (see chart for details).       [name] [name] is a 38 y.o. male who presents to ED for influenza-like illness consisting of fatigue, generalized body aches, fever, chills, productive cough and congestion for the last 3 days.  Out of Tamiflu window.  Initially a little tachycardic, but afebrile and hemodynamically stable.  Does report fevers at home.  Given a liter of fluid which did improve tachycardia.  Given his history of Behcet's disease and him being on CellCept, labs and chest x-ray were performed which were reassuring.  No pneumonia.  EKG without acute ischemic changes or arrhythmia.  Given his immune compromised state, will cover with azithromycin.  Strongly encouraged him to follow-up with his primary care doctor for reevaluation.  Reasons to return to the emergency department were discussed at length and all questions were answered.  Patient discussed with Dr. Francia Greaves who agrees with treatment plan.    Final diagnoses:  Cough  Influenza-like illness    ED Discharge Orders    None       Martia Dalby, Ozella Almond, PA-C 06/10/18 1637    Valarie Merino, MD 06/12/18 [phone]

## 2018-06-10 NOTE — Discharge Instructions (Signed)
It was my pleasure taking care of you today!   Please take all of your antibiotics until finished!   Tessalon as needed for cough.  Alternate between Tylenol and Ibuprofen as needed for fever control / pain.  I have given you just a few pain pills to take only as needed for severe pain.   Follow up with your doctor for recheck. Call today or tomorrow to schedule a follow up appointment.   Return to ER for difficulty breathing, new or worsening symptoms, any additional concerns.

## 2018-06-10 NOTE — ED Triage Notes (Signed)
Patient c/o a productive cough with green sputum, fever, and generalized body aches 3 days.

## 2018-09-10 ENCOUNTER — Telehealth: Payer: Self-pay | Admitting: Cardiovascular Disease

## 2018-09-10 NOTE — Telephone Encounter (Signed)
New Message    Juliann Pulse from Dr. Leighton Ruff office called to let us know she faxed over EKG and it contained non specific changes from the previous EKG.  She also wants to know if patient needs to see Dr. Loletha Grayer for a follow up.   The patient will be having a couple procedures done from two other doctors offices and she just wanted to give [place] a heads up since the office never requested any surgical clearances.  Procedure 1 Colonoscopy 09/11/18 with Dr. Eduard Roux [phone]  Procedure 2 Xyphoid Process Surgery 09/16/18 with Dr. Juleen Starr [phone]   Gar Gibbon the specific offices should be calling in providing the necessary information about surgery to get a surgical clearance she stated she just wanted to give [place] a heads up about it.

## 2018-09-11 ENCOUNTER — Encounter: Payer: Self-pay | Admitting: Cardiovascular Disease

## 2018-09-11 NOTE — Telephone Encounter (Signed)
Please let me know when the ECG is scanned in, I cannot see it at this time. We do not do PFTs inour office.  At this point I do not see a reason to repeat his echo (performed last September), unless the ECG shows major changes.

## 2018-09-11 NOTE — Telephone Encounter (Signed)
Spoke with pt. He state he was told by his pcp that he needed to follow up with Dr. Loletha Grayer to have an ECHO and PFT. Pt report his pcp also stated that his recent EKG showed changes from previous one.  Pt also report he is scheduled to have a colonoscopy and surgery to have his xyphoid process removed and will need surgical clearance. Will route message to MD.

## 2018-09-11 NOTE — Telephone Encounter (Signed)
Reviewed the ECG. It is unchanged from past tracings. I do not think we need to repeat his echo, based on the information provided. As far as the PFTs, that is outside my field.  I can send letters to the surgical providers stating he is low risk from CV standpoint for the planned procedures, without need for additional CV workup:  Procedure 1 Colonoscopy 09/11/18 with Dr. Eduard Roux [phone]  Procedure 2 Xyphoid Process Surgery 09/16/18 with Dr. Juleen Starr [phone]

## 2018-09-11 NOTE — Telephone Encounter (Signed)
Follow up     Pt is calling and says his PC says he is needing to be scheduled for tests in our office    Please call back

## 2018-09-11 NOTE — Telephone Encounter (Signed)
The patient has been made aware of Dr. Victorino December response and that the cardiac clearances have been sent (they have been sent via epic) He verbalized his understanding.

## 2018-09-20 ENCOUNTER — Emergency Department (HOSPITAL_COMMUNITY)
Admission: EM | Admit: 2018-09-20 | Discharge: 2018-09-20 | Disposition: A | Discharge status: Home / Self Care | Payer: Medicare Other | Attending: Emergency Medicine | Admitting: Emergency Medicine

## 2018-09-20 ENCOUNTER — Other Ambulatory Visit: Payer: Self-pay

## 2018-09-20 ENCOUNTER — Encounter (HOSPITAL_COMMUNITY): Payer: Self-pay | Admitting: Emergency Medicine

## 2018-09-20 DIAGNOSIS — I10 Essential (primary) hypertension: Secondary | ICD-10-CM | POA: Diagnosis not present

## 2018-09-20 DIAGNOSIS — G40909 Epilepsy, unspecified, not intractable, without status epilepticus: Secondary | ICD-10-CM | POA: Insufficient documentation

## 2018-09-20 DIAGNOSIS — Z79899 Other long term (current) drug therapy: Secondary | ICD-10-CM | POA: Diagnosis not present

## 2018-09-20 DIAGNOSIS — L02811 Cutaneous abscess of head [any part, except face]: Secondary | ICD-10-CM | POA: Diagnosis not present

## 2018-09-20 DIAGNOSIS — L0291 Cutaneous abscess, unspecified: Secondary | ICD-10-CM

## 2018-09-20 MED ORDER — OXYCODONE HCL 5 MG PO TABS: 5.0000 mg | ORAL_TABLET | ORAL | 0 refills | Status: DC | PRN

## 2018-09-20 MED ORDER — CEPHALEXIN 500 MG PO CAPS: 500.0000 mg | ORAL_CAPSULE | Freq: Four times a day (QID) | ORAL | 0 refills | Status: DC

## 2018-09-20 MED ORDER — LIDOCAINE-EPINEPHRINE (PF) 2 %-1:200000 IJ SOLN
10.0000 mL | Freq: Once | INTRAMUSCULAR | Status: AC
  Administered 2018-09-20: 10 mL
  Filled 2018-09-20: qty 10

## 2018-09-20 NOTE — ED Triage Notes (Signed)
C/o abscess like area on head for 3 days that is draining green pus.

## 2018-09-20 NOTE — ED Provider Notes (Signed)
[facility] DEPT Provider Note   CSN: [id number] Arrival date & time: 09/20/18  1441     History   Chief Complaint Chief Complaint  Patient presents with   Abscess    on head     HPI [name] [name] is a 38 y.o. male.  He has a history of Bechets disease and he says it causes him to get skin abscesses frequently.  He is complaining of an abscess on the back of his scalp that is been going on for over a week and for the last few days has been draining green pus.  It is growing in size and the pain is becoming more intense.  He denies fever.  He said he has had MRSA in the past.     The history is provided by the patient.  Abscess Location:  Head/neck Head/neck abscess location:  Scalp Size:  3 Abscess quality: draining, painful and redness   Red streaking: no   Progression:  Worsening Pain details:    Quality:  Throbbing   Severity:  Moderate   Timing:  Constant   Progression:  Worsening Chronicity:  Recurrent Relieved by:  Nothing Worsened by:  Draining/squeezing Ineffective treatments:  None tried Associated symptoms: headaches   Associated symptoms: no fever   Risk factors: hx of MRSA and prior abscess     Past Medical History:  Diagnosis Date   Behcet's disease ([place])    Bipolar disorder ([place])    Cardiac arrest ([place]) 12/25/2017   one round of CPR    Hypertension    Migraine    Seizures ([place])    last experienced a seizure x 3 years ago   Vitamin D deficiency     Patient Active Problem List   Diagnosis Date Noted   Cardiac arrest ([place]) 12/25/2017   Intubation of airway performed without difficulty    Lactic acidosis    Acute respiratory failure with hypoxia ([facility])    Seizures ([place])    Post-ictal coma ([place]) 08/03/2016   Acute respiratory failure with hypoxemia ([facility])    Seizure ([place])    Altered mental status 07/07/2016   Peripheral vertigo 07/17/2015   Vertigo 07/17/2015   Intractable vomiting 07/17/2015     Diplopia    Cellulitis and abscess of trunk-right chest wall 05/08/2013   Dehydration 02/15/2013   Behcet's disease ([place]) 02/15/2013   Recurrent vomiting 02/15/2013   Encounter for therapeutic drug monitoring 06/14/2012   History of oral aphthous ulcers 10/19/2010   BIPOLAR DISORDER UNSPECIFIED 06/18/2008   Essential hypertension 06/18/2008   SYNCOPE AND COLLAPSE 06/18/2008   Epilepsy ([place]) 06/18/2008    Past Surgical History:  Procedure Laterality Date   APPENDECTOMY     FRACTURE SURGERY Right    Arm   maxilofacial     NISSEN FUNDOPLICATION          Home Medications    Prior to Admission medications   Medication Sig Start Date End Date Taking? Authorizing Provider  Apremilast (OTEZLA) 30 MG TABS Take 1 tablet by mouth 2 (two) times daily.    [provider]  azithromycin (ZITHROMAX) 250 MG tablet Take 1 tablet (250 mg total) by mouth daily. Take first 2 tablets together, then 1 every day until finished. Patient not taking: Reported on 09/20/2018 06/10/18   Ward, Ozella Almond, PA-C  benzonatate (TESSALON) 100 MG capsule Take 1 capsule (100 mg total) by mouth 3 (three) times daily as needed for cough. Patient not taking: Reported on 09/20/2018  06/10/18   Ward, Ozella Almond, PA-C  Cholecalciferol (VITAMIN D3 PO) Take 8,000 Units by mouth.    [provider]  clonazePAM (KLONOPIN) 0.5 MG tablet Take 0.5 mg by mouth 3 (three) times daily as needed for anxiety.    [provider]  FLUoxetine (PROZAC) 20 MG/5ML solution Take 80 mg by mouth daily.  11/27/16   [provider]  lamoTRIgine 200 MG TBDP Take 1 tablet (200 mg total) by mouth 2 (two) times daily. 05/28/18   Kathrynn Ducking, MD  levETIRAcetam (KEPPRA) 100 MG/ML solution TAKE 7 ML(700 MG) BY MOUTH TWICE DAILY Patient taking differently: Take 700 mg by mouth 2 (two) times daily.  06/03/18   Kathrynn Ducking, MD  mycophenolate (CELLCEPT) 500 MG tablet Take 1,000 mg by mouth 2  (two) times daily.  03/20/17   [provider]  oxyCODONE (ROXICODONE) 5 MG immediate release tablet Take 1 tablet (5 mg total) by mouth every 4 (four) hours as needed for severe pain. Patient not taking: Reported on 09/20/2018 06/10/18   Ward, Ozella Almond, PA-C  QUEtiapine (SEROQUEL) 200 MG tablet Take 200 mg by mouth at bedtime.    [provider]    Family History Family History  Problem Relation Age of Onset   Lung cancer Paternal Grandfather    Other Father        BPPV    Social History Social History   Tobacco Use   Smoking status: Never Smoker   Smokeless tobacco: Never Used  Substance Use Topics   Alcohol use: No   Drug use: No     Allergies   Patient has no known allergies.   Review of Systems Review of Systems  Constitutional: Negative for fever.  Skin: Positive for wound.  Neurological: Positive for headaches.     Physical Exam Updated Vital Signs BP (!) 140/102 (BP Location: Right Arm)    Pulse 80    Temp 98.2 F (36.8 C) (Oral)    Resp 16    SpO2 100%   Physical Exam Vitals signs and nursing note reviewed.  Constitutional:      Appearance: He is well-developed.  HENT:     Head: Normocephalic and atraumatic.     Comments: He is approximately 3 x 3 cm abscess posterior scalp at the occiput more to the right.  It is tender and will drain a little bit of pus with palpation. Eyes:     Conjunctiva/sclera: Conjunctivae normal.  Neck:     Musculoskeletal: Neck supple.  Cardiovascular:     Rate and Rhythm: Normal rate and regular rhythm.  Pulmonary:     Effort: Pulmonary effort is normal.  Skin:    General: Skin is warm and dry.     Comments: See head  Neurological:     General: No focal deficit present.     Mental Status: He is alert.     GCS: GCS eye subscore is 4. GCS verbal subscore is 5. GCS motor subscore is 6.      ED Treatments / Results  Labs (all labs ordered are listed, but only abnormal results are  displayed) Labs Reviewed - No data to display  EKG    Radiology No results found.  Procedures .Marland KitchenIncision and Drainage  Date/Time: 09/20/2018 4:00 PM Performed by: Hayden Rasmussen, MD Authorized by: Hayden Rasmussen, MD   Consent:    Consent obtained:  Verbal   Consent given by:  Patient   Risks discussed:  Bleeding,  incomplete drainage, pain and infection   Alternatives discussed:  No treatment, delayed treatment and referral Location:    Type:  Abscess   Size:  3   Location:  Head   Head location:  Scalp Pre-procedure details:    Skin preparation:  Betadine Anesthesia (see MAR for exact dosages):    Anesthesia method:  Local infiltration   Local anesthetic:  Lidocaine 2% WITH epi Procedure type:    Complexity:  Simple Procedure details:    Incision types:  Single straight   Scalpel blade:  15   Wound management:  Probed and deloculated   Drainage:  Purulent   Drainage amount:  Moderate   Packing materials:  1/2 in gauze Post-procedure details:    Patient tolerance of procedure:  Tolerated well, no immediate complications   (including critical care time)  Medications Ordered in ED Medications  lidocaine-EPINEPHrine (XYLOCAINE W/EPI) 2 %-1:200000 (PF) injection 10 mL (has no administration in time range)     Initial Impression / Assessment and Plan / ED Course  I have reviewed the triage vital signs and the nursing notes.  Pertinent labs & imaging results that were available during my care of the patient were reviewed by me and considered in my medical decision making (see chart for details).        Final Clinical Impressions(s) / ED Diagnoses   Final diagnoses:  Abscess    ED Discharge Orders         Ordered    oxyCODONE (ROXICODONE) 5 MG immediate release tablet  Every 4 hours PRN     09/20/18 1645    cephALEXin (KEFLEX) 500 MG capsule  4 times daily     09/20/18 1646           Hayden Rasmussen, MD 09/21/18 [phone]

## 2018-09-20 NOTE — Discharge Instructions (Signed)
You were seen in the emergency department for an abscess in the back of your head.  The area was incised and drained.  We are putting you on some antibiotics and some pain medicine.  Please remove the packing in 2 days.  Follow-up with your doctor or return if any worsening symptoms.

## 2018-10-27 ENCOUNTER — Other Ambulatory Visit: Payer: Self-pay | Admitting: Neurology

## 2018-11-07 ENCOUNTER — Emergency Department (HOSPITAL_COMMUNITY): Payer: Medicare Other

## 2018-11-07 ENCOUNTER — Emergency Department (HOSPITAL_COMMUNITY)
Admission: EM | Admit: 2018-11-07 | Discharge: 2018-11-08 | Disposition: A | Discharge status: Home / Self Care | Payer: Medicare Other | Attending: Emergency Medicine | Admitting: Emergency Medicine

## 2018-11-07 ENCOUNTER — Other Ambulatory Visit: Payer: Self-pay

## 2018-11-07 ENCOUNTER — Encounter (HOSPITAL_COMMUNITY): Payer: Self-pay

## 2018-11-07 DIAGNOSIS — I252 Old myocardial infarction: Secondary | ICD-10-CM | POA: Insufficient documentation

## 2018-11-07 DIAGNOSIS — R200 Anesthesia of skin: Secondary | ICD-10-CM | POA: Diagnosis present

## 2018-11-07 DIAGNOSIS — I10 Essential (primary) hypertension: Secondary | ICD-10-CM | POA: Insufficient documentation

## 2018-11-07 DIAGNOSIS — R079 Chest pain, unspecified: Secondary | ICD-10-CM

## 2018-11-07 DIAGNOSIS — R202 Paresthesia of skin: Secondary | ICD-10-CM

## 2018-11-07 LAB — BASIC METABOLIC PANEL
Anion gap: 13 (ref 5–15)
BUN: 16 mg/dL (ref 6–20)
CO2: 20 mmol/L — ABNORMAL LOW (ref 22–32)
Calcium: 9.3 mg/dL (ref 8.9–10.3)
Chloride: 106 mmol/L (ref 98–111)
Creatinine, Ser: 1.04 mg/dL (ref 0.61–1.24)
GFR calc Af Amer: >60 mL/min (ref >60)
GFR calc non Af Amer: >60 mL/min (ref >60)
Glucose, Bld: 113 mg/dL — ABNORMAL HIGH (ref 70–99)
Potassium: 3.6 mmol/L (ref 3.5–5.1)
Sodium: 139 mmol/L (ref 135–145)

## 2018-11-07 LAB — CBC
HCT: 47.2 % (ref 39.0–52.0)
Hemoglobin: 15.6 g/dL (ref 13.0–17.0)
MCH: 31 pg (ref 26.0–34.0)
MCHC: 33.1 g/dL (ref 30.0–36.0)
MCV: 93.7 fL (ref 80.0–100.0)
Platelets: 191 10*3/uL (ref 150–400)
RBC: 5.04 MIL/uL (ref 4.22–5.81)
RDW: 12.5 % (ref 11.5–15.5)
WBC: 9.9 10*3/uL (ref 4.0–10.5)
nRBC: 0 % (ref 0.0–0.2)

## 2018-11-07 LAB — TROPONIN I (HIGH SENSITIVITY): Troponin I (High Sensitivity): 3 ng/L (ref <18)

## 2018-11-07 MED ORDER — KETOROLAC TROMETHAMINE 30 MG/ML IJ SOLN
15.0000 mg | Freq: Once | INTRAMUSCULAR | Status: AC
  Administered 2018-11-08: 15 mg via INTRAVENOUS
  Filled 2018-11-07: qty 1

## 2018-11-07 MED ORDER — SODIUM CHLORIDE 0.9 % IV BOLUS
1000.0000 mL | Freq: Once | INTRAVENOUS | Status: AC
  Administered 2018-11-08: 1000 mL via INTRAVENOUS

## 2018-11-07 MED ORDER — SODIUM CHLORIDE 0.9% FLUSH: 3.0000 mL | Freq: Once | INTRAVENOUS | Status: DC

## 2018-11-07 NOTE — ED Provider Notes (Signed)
Emergency Department Provider Note   I have reviewed the triage vital signs and the nursing notes.   HISTORY  Chief Complaint Chest Pain and Numbness   HPI [name] [name] is a 38 y.o. male who presents the emergency department today with chest pain.  Patient has a history of some type of cardiac arrest versus postictal state back in September.  He had extensive work-up done which was negative but today he was walking to the store about an hour after eating and he had acute onset of sharp left-sided chest pain that radiated to the left side of his neck.  States it did not feel exactly like heartburn he had in the past.  Had no associated nausea, vomiting, diaphoresis, shortness of breath or syncope.  Patient stated about 5 minutes of the pain he started have some tingling in both of his hands.  He states that he overall just feels unwell at this point.  No other complaints.  No lower extremity swelling, rashes, cough or fever recently.    No other associated or modifying symptoms.    Past Medical History:  Diagnosis Date  . Behcet's disease ([place])   . Bipolar disorder ([place])   . Cardiac arrest ([place]) 12/25/2017   one round of CPR   . Hypertension   . Migraine   . Seizures ([place])    last experienced a seizure x 3 years ago  . Vitamin D deficiency     Patient Active Problem List   Diagnosis Date Noted  . Cardiac arrest ([place]) 12/25/2017  . Intubation of airway performed without difficulty   . Lactic acidosis   . Acute respiratory failure with hypoxia ([place])   . Seizures ([place])   . Post-ictal coma ([place]) 08/03/2016  . Acute respiratory failure with hypoxemia ([place])   . Seizure ([place])   . Altered mental status 07/07/2016  . Peripheral vertigo 07/17/2015  . Vertigo 07/17/2015  . Intractable vomiting 07/17/2015  . Diplopia   . Cellulitis and abscess of trunk-right chest wall 05/08/2013  . Dehydration 02/15/2013  . Behcet's disease ([place]) 02/15/2013  . Recurrent vomiting 02/15/2013   . Encounter for therapeutic drug monitoring 06/14/2012  . History of oral aphthous ulcers 10/19/2010  . BIPOLAR DISORDER UNSPECIFIED 06/18/2008  . Essential hypertension 06/18/2008  . SYNCOPE AND COLLAPSE 06/18/2008  . Epilepsy ([place]) 06/18/2008    Past Surgical History:  Procedure Laterality Date  . APPENDECTOMY    . FRACTURE SURGERY Right    Arm  . maxilofacial    . NISSEN FUNDOPLICATION      Current Outpatient Rx  . Order #: [id number] Class: Historical Med  . Order #: [id number] Class: Historical Med  . Order #: [id number] Class: Historical Med  . Order #: [id number] Class: Normal  . Order #: [id number] Class: Normal  . Order #: [id number] Class: Historical Med  . Order #: [id number] Class: Historical Med    Allergies Patient has no known allergies.  Family History  Problem Relation Age of Onset  . Lung cancer Paternal Grandfather   . Other Father        BPPV    Social History Social History   Tobacco Use  . Smoking status: Never Smoker  . Smokeless tobacco: Never Used  Substance Use Topics  . Alcohol use: No  . Drug use: No    Review of Systems  All other systems negative except as documented in the HPI. All pertinent positives and negatives as reviewed in the HPI. ____________________________________________   PHYSICAL EXAM:  VITAL SIGNS:  ED Triage Vitals  Enc Vitals Group     BP 11/07/18 2133 (!) 149/96     Pulse Rate 11/07/18 2133 (!) 123     Resp 11/07/18 2133 16     Temp 11/07/18 2133 98.6 F (37 C)     Temp Source 11/07/18 2133 Oral     SpO2 11/07/18 2133 95 %    Constitutional: Alert and oriented. Well appearing and in no acute distress. Eyes: Conjunctivae are normal. PERRL. EOMI. Head: Atraumatic. Nose: No congestion/rhinnorhea. Mouth/Throat: Mucous membranes are moist.  Oropharynx non-erythematous. Neck: No stridor.  No meningeal signs.   Cardiovascular: Normal rate, regular rhythm. Good peripheral circulation. Grossly normal heart sounds.    Respiratory: Normal respiratory effort.  No retractions. Lungs CTAB. Gastrointestinal: Soft and nontender. No distention.  Musculoskeletal: No lower extremity tenderness nor edema. No gross deformities of extremities. Neurologic:  Normal speech and language. No gross focal neurologic deficits are appreciated.  Skin:  Skin is warm, dry and intact. No rash noted. Well healing scar over xiphoid   ____________________________________________   LABS (all labs ordered are listed, but only abnormal results are displayed)  Labs Reviewed  BASIC METABOLIC PANEL - Abnormal; Notable for the following components:      Result Value   CO2 20 (*)    Glucose, Bld 113 (*)    All other components within normal limits  CBC  D-DIMER, QUANTITATIVE (NOT AT [facility])  LEVETIRACETAM LEVEL  LAMOTRIGINE LEVEL  TROPONIN I (HIGH SENSITIVITY)  TROPONIN I (HIGH SENSITIVITY)   ____________________________________________  EKG   EKG Interpretation  Date/Time:  Thursday November 07 2018 21:27:57 EDT Ventricular Rate:  124 PR Interval:    QRS Duration: 90 QT Interval:  292 QTC Calculation: 420 R Axis:   61 Text Interpretation:  Sinus tachycardia Probable inferior infarct, age indeterminate Lateral leads are also involved seems similar to previous in march 2020 Confirmed by Merrily Pew [phone]) on 11/07/2018 10:59:23 PM      ____________________________________________  RADIOLOGY  Dg Chest 2 View  Result Date: 11/07/2018 CLINICAL DATA:  Chest pain. EXAM: CHEST - 2 VIEW COMPARISON:  06/10/2018 FINDINGS: Cardiomediastinal silhouette is normal. Mediastinal contours appear intact. There is no evidence of focal airspace consolidation, pleural effusion or pneumothorax. Osseous structures are without acute abnormality. Soft tissues are grossly normal. IMPRESSION: No active cardiopulmonary disease. Electronically Signed   By: Fidela Salisbury M.D.   On: 11/07/2018 21:58     ____________________________________________   PROCEDURES  Procedure(s) performed:   Procedures   ____________________________________________   INITIAL IMPRESSION / ASSESSMENT AND PLAN / ED COURSE  Patient overall appears very well.  His EKG looks exactly the way it did previously.  His first troponin is negative.  He was a bit tachycardic when he got here and tachypneic so we will add on d-dimer to ensure it is not a PE.  He is also on a couple anti-epileptic so we will check those levels.  We will get a second troponin.  His chest x-ray looks clear his lungs sound clear I doubt pneumonia as a cause for his pleuritic chest pain.  Also no recent illnesses to suggest that it could be pericarditis.  Will give him some anti-inflammatory and reevaluate after labs return.  Reevaluation patient's symptoms have resolved.  He now feels it is similar to baseline except for being tired because of the time.  His labs are unremarkable.  D-dimer negative making PE unlikely.  Unlikely ACS.  Still pending his levels but no need  to keep him here for that as well as will take extended period of time.  Stable for discharge at this time.  Pertinent labs & imaging results that were available during my care of the patient were reviewed by me and considered in my medical decision making (see chart for details).  A medical screening exam was performed and I feel the patient has had an appropriate workup for their chief complaint at this time and likelihood of emergent condition existing is low. They have been counseled on decision, discharge, follow up and which symptoms necessitate immediate return to the emergency department. They or their family verbally stated understanding and agreement with plan and discharged in stable condition.   ____________________________________________  FINAL CLINICAL IMPRESSION(S) / ED DIAGNOSES  Final diagnoses:  Paresthesia  Chest pain, unspecified type     MEDICATIONS  GIVEN DURING THIS VISIT:  Medications  sodium chloride flush (NS) 0.9 % injection 3 mL (0 mLs Intravenous Hold 11/07/18 2239)  ketorolac (TORADOL) 30 MG/ML injection 15 mg (15 mg Intravenous Given 11/08/18 0013)  sodium chloride 0.9 % bolus 1,000 mL (1,000 mLs Intravenous New Bag/Given 11/08/18 0013)     NEW OUTPATIENT MEDICATIONS STARTED DURING THIS VISIT:  Current Discharge Medication List      Note:  This note was prepared with assistance of Dragon voice recognition software. Occasional wrong-word or sound-a-like substitutions may have occurred due to the inherent limitations of voice recognition software.   Ephriam Turman, Corene Cornea, MD 11/08/18 [phone]

## 2018-11-07 NOTE — ED Triage Notes (Signed)
Pt reports chest pain that started about 45 minutes that radiates down left arm. Pt also reports right hand numbness. Pt states "my heart stopped a few months back".

## 2018-11-08 ENCOUNTER — Telehealth: Payer: Self-pay | Admitting: Neurology

## 2018-11-08 DIAGNOSIS — R202 Paresthesia of skin: Secondary | ICD-10-CM | POA: Diagnosis not present

## 2018-11-08 LAB — D-DIMER, QUANTITATIVE: D-Dimer, Quant: 0.43 ug/mL-FEU (ref 0.00–0.50)

## 2018-11-08 LAB — TROPONIN I (HIGH SENSITIVITY): Troponin I (High Sensitivity): 3 ng/L (ref <18)

## 2018-11-08 NOTE — Telephone Encounter (Signed)
Pt has called to inform that he went to the hospital last for chest pain.  Pt states as a result of the chest pain pt states he has numbness and tingling in his arms, hands and right pinky. Pt states re: the numbness the hospital advised him to f/u with his neurologist.  Pt is asking for a call to discuss

## 2018-11-10 LAB — LEVETIRACETAM LEVEL: Levetiracetam Lvl: 22.2 ug/mL (ref 10.0–40.0)

## 2018-11-11 LAB — LAMOTRIGINE LEVEL: Lamotrigine Lvl: 12.1 ug/mL (ref 2.0–20.0)

## 2018-11-11 NOTE — Telephone Encounter (Signed)
I reached out to the pt. He reports his numbness/tingling sx are the same and wanted to know if his 11/27/2018 appt could be moved up? I advised Dr. Jannifer Franklin will be out of town 8/13-8/21/2020 but if an appt opens on 8/11/ or 8/ 12 I would let him know. Pt was agreeable.

## 2018-11-11 NOTE — Telephone Encounter (Signed)
I contacted the pt and advised we had an opening available for 11/12/18 at 3 pm and pt accepted. Pt advised to arrived at 230 tomorrow and wear a mask.

## 2018-11-12 ENCOUNTER — Ambulatory Visit (INDEPENDENT_AMBULATORY_CARE_PROVIDER_SITE_OTHER): Payer: Medicare Other | Admitting: Neurology

## 2018-11-12 ENCOUNTER — Encounter: Payer: Self-pay | Admitting: Neurology

## 2018-11-12 ENCOUNTER — Other Ambulatory Visit: Payer: Self-pay

## 2018-11-12 VITALS — BP 143/95 | HR 123 | Temp 98.4°F | Ht 72.0 in | Wt 254.0 lb

## 2018-11-12 DIAGNOSIS — M352 Behcet's disease: Secondary | ICD-10-CM | POA: Diagnosis not present

## 2018-11-12 DIAGNOSIS — R202 Paresthesia of skin: Secondary | ICD-10-CM | POA: Diagnosis not present

## 2018-11-12 DIAGNOSIS — G40909 Epilepsy, unspecified, not intractable, without status epilepticus: Secondary | ICD-10-CM | POA: Diagnosis not present

## 2018-11-12 MED ORDER — ALPRAZOLAM 0.5 MG PO TABS: ORAL_TABLET | ORAL | 0 refills | Status: DC

## 2018-11-12 NOTE — Progress Notes (Signed)
Reason for visit: Behcet's disease, seizures  [name] [name] is an 38 y.o. male  History of present illness:  Mr. Yeatts is a 38 year old right-handed white male with a history of Behcet's disease associated with seizures.  The patient went to the emergency room on 07 November 2018 with sudden onset of left-sided chest pain with pain radiating up into the left jaw associated with diaphoresis.  At the same time, he had sudden onset of numbness that mainly included the ulnar aspect of the right hand.  The chest pain has gone away but the right hand numbness has persisted.  He went to the ER, EKG evaluation apparently showed some new changes from 4 to 6 weeks ago.  The patient will be set up to see cardiology next week.  The patient had recent surgery for resection of the xiphoid process that had become inflamed with the Behcet's disease.  The patient has had increased fatigue since 6 August, he has difficulty using the right hand because of the numbness.  He denies any pain in the neck or down the arm.  He has not had any recent seizures.  He is not operating a motor vehicle as he is afraid to do this because of the history of seizures and cardiac arrest previously.  The patient returns to this office for an evaluation.  Past Medical History:  Diagnosis Date  . Behcet's disease ([place])   . Bipolar disorder ([place])   . Cardiac arrest ([place]) 12/25/2017   one round of CPR   . Hypertension   . Migraine   . Seizures ([place])    last experienced a seizure x 3 years ago  . Vitamin D deficiency     Past Surgical History:  Procedure Laterality Date  . APPENDECTOMY    . FRACTURE SURGERY Right    Arm  . maxilofacial    . NISSEN FUNDOPLICATION      Family History  Problem Relation Age of Onset  . Lung cancer Paternal Grandfather   . Other Father        BPPV    Social history:  reports that he has never smoked. He has never used smokeless tobacco. He reports that he does not drink alcohol or use drugs.   No Known Allergies  Medications:  Prior to Admission medications   Medication Sig Start Date End Date Taking? Authorizing Provider  Apremilast (OTEZLA) 30 MG TABS Take 1 tablet by mouth 2 (two) times daily.   Yes [provider]  clonazePAM (KLONOPIN) 0.5 MG tablet Take 0.5 mg by mouth 3 (three) times daily as needed for anxiety.   Yes [provider]  FLUoxetine (PROZAC) 20 MG/5ML solution Take 80 mg by mouth daily.  11/27/16  Yes [provider]  lamoTRIgine 200 MG TBDP DISSOLVE 1 TABLET BY MOUTH TWICE DAILY(MORNING AND BEDTIME) Patient taking differently: Take 200 mg by mouth 2 (two) times daily.  10/28/18  Yes Kathrynn Ducking, MD  levETIRAcetam (KEPPRA) 100 MG/ML solution TAKE 7 ML(700 MG) BY MOUTH TWICE DAILY Patient taking differently: Take 700 mg by mouth 2 (two) times daily.  06/03/18  Yes Kathrynn Ducking, MD  mycophenolate (CELLCEPT) 500 MG tablet Take 1,000 mg by mouth 2 (two) times daily.  03/20/17  Yes [provider]  QUEtiapine (SEROQUEL) 200 MG tablet Take 200 mg by mouth at bedtime.   Yes [provider]    ROS:  Out of a complete 14 system review of symptoms, the patient  complains only of the following symptoms, and all other reviewed systems are negative.  Right hand numbness Fatigue  Blood pressure (!) 143/95, pulse (!) 123, temperature 98.4 F (36.9 C), temperature source Temporal, height 6' (1.829 m), weight 254 lb (115.2 kg).  Physical Exam  General: The patient is alert and cooperative at the time of the examination.  The patient is moderately to markedly obese.  Skin: No significant peripheral edema is noted.   Neurologic Exam  Mental status: The patient is alert and oriented x 3 at the time of the examination. The patient has apparent normal recent and remote memory, with an apparently normal attention span and concentration ability.   Cranial nerves: Facial symmetry is present. Speech is normal, no aphasia  or dysarthria is noted. Extraocular movements are full. Visual fields are full.  Motor: The patient has good strength in all 4 extremities.  The distal flexors of the hands on both sides are of normal strength, intrinsic muscles of the hands are of normal strength bilaterally.  Sensory examination: Soft touch sensation is symmetric on the face, arms, and legs, with exception of some numbness of the ulnar aspect of the right hand and right fifth finger, no numbness involving the ring finger on the right at all.  Coordination: The patient has good finger-nose-finger and heel-to-shin bilaterally.  Gait and station: The patient has a normal gait. Tandem gait is normal. Romberg is negative. No drift is seen.  Reflexes: Deep tendon reflexes are symmetric.   Assessment/Plan:  1.  New onset right hand numbness  2.  Behcet's disease  3.  History of seizures, no recent recurrence  The patient had sudden onset of chest pain, left jaw pain and diaphoresis with onset of right hand numbness at the same time.  The right hand numbness has been persistent.  I suppose that a small stroke event does need to be considered.  The patient will be set up for MRI of the brain.  The examination is not fully consistent with an ulnar nerve issue.  He will follow-up otherwise in 6 months.  He will continue his Keppra and Lamictal, he had recent blood levels done on 07 November 2018, both medications were in the therapeutic range.  Jill Alexanders MD 11/12/2018 2:55 PM  [facility] [address] [place], [place] [address]  Phone [phone] Fax [phone]

## 2018-11-13 ENCOUNTER — Telehealth: Payer: Self-pay | Admitting: Neurology

## 2018-11-13 NOTE — Telephone Encounter (Signed)
Medicare/uhc auth: [employer] via uhc website order sent to [facility]. They will reach out to the patient to schedule.  

## 2018-11-19 ENCOUNTER — Telehealth: Payer: Self-pay

## 2018-11-19 NOTE — Telephone Encounter (Signed)
Late entry~ on 11/14/2018 letter for retirement plan has been submitted. Letter was faxed to [phone] and received confirmation.

## 2018-11-20 NOTE — Progress Notes (Deleted)
{Choose 1 Note Type (Telehealth Visit or Telephone Visit):[phone]}   Date:  11/26/2018   ID:  [name] [name], DOB 1981-03-09, MRN [id number]  {Patient Location:[phone]::"Home"} {Provider Location:[phone]::"Home"}  PCP:  Leighton Ruff, MD  Cardiologist:  No primary care provider on file. *** Electrophysiologist:  None   Evaluation Performed:  {Choose Visit Type:[phone]::"Follow-Up Visit"}  Chief Complaint:  ***  History of Present Illness:     The patient {does/does not:200015} have symptoms concerning for COVID-19 infection (fever, chills, cough, or new shortness of breath).    Past Medical History:  Diagnosis Date  . Behcet's disease ([place])   . Bipolar disorder ([place])   . Cardiac arrest ([place]) 12/25/2017   one round of CPR   . Hypertension   . Migraine   . Seizures ([place])    last experienced a seizure x 3 years ago  . Vitamin D deficiency    Past Surgical History:  Procedure Laterality Date  . APPENDECTOMY    . FRACTURE SURGERY Right    Arm  . maxilofacial    . NISSEN FUNDOPLICATION       No outpatient medications have been marked as taking for the 11/26/18 encounter (Appointment) with Abigail Butts., PA-C.     Allergies:   Patient has no known allergies.   Social History   Tobacco Use  . Smoking status: Never Smoker  . Smokeless tobacco: Never Used  Substance Use Topics  . Alcohol use: No  . Drug use: No     Family Hx: The patient's family history includes Lung cancer in his paternal grandfather; Other in his father.  ROS:   Please see the history of present illness.     All other systems reviewed and are negative.   Prior CV studies:   The following studies were reviewed today:  Echocardiogram 12/2017: - Left ventricle: The cavity size was normal. Wall thickness was   normal. Systolic function was vigorous. The estimated ejection   fraction was in the range of 65% to 70%. Wall motion was normal;   there were no regional wall  motion abnormalities. Doppler   parameters are consistent with abnormal left ventricular   relaxation (grade 1 diastolic dysfunction). The E/e&' ratio is   between 8-15, suggesting indeterminate LV filling pressure. - Mitral valve: Mildly thickened leaflets . There was trivial   regurgitation. - Left atrium: The atrium was normal in size. - Inferior vena cava: The vessel was normal in size. The   respirophasic diameter changes were in the normal range (>= 50%),   consistent with normal central venous pressure.  Impressions:  - LVEF 65-70%, normal wall thickness, normal regional wall motion,   grade 1 DD, indeterminate LV filling pressure, trivial MR, normal   LA size, normal IVC.  Cardiac event monitor 01/2018:  No rhythm abnormalities are seen during the recording.  The rhythm is normal sinus with periods of sinus tachycardia during daytime hours, normal circadian variation.  A few patient triggered recordings are submitted, all of them associated with mild sinus tachycardia.   Normal 14-day event monitor.  Symptoms are associated with mild sinus tachycardia.  NST 02/2018:  Nuclear stress EF: 64%.  The left ventricular ejection fraction is normal (55-65%).  Stress EKG showed no change from baseline EKG showing downsloping ST segment depression in the inferolateral leads  The study is normal.  This is a low risk study.  Labs/Other Tests and Data Reviewed:    EKG:  An ECG dated 11/07/2018 was personally reviewed  today and demonstrated:  sinus tachycardia with rate 124 bpm, non-specific ST-T wave abnormalities, no STE/D, no significant change from previous  Recent Labs: 12/28/2017: Magnesium 2.1 05/28/2018: ALT 22 11/07/2018: BUN 16; Creatinine, Ser 1.04; Hemoglobin 15.6; Platelets 191; Potassium 3.6; Sodium 139   Recent Lipid Panel Lab Results  Component Value Date/Time   TRIG 405 (H) 12/27/2017 05:18 AM    Wt Readings from Last 3 Encounters:  11/12/18 254 lb (115.2  kg)  06/10/18 230 lb (104.3 kg)  05/28/18 236 lb (107 kg)     Objective:    Vital Signs:  There were no vitals taken for this visit.   {HeartCare Virtual Exam (Optional):[phone]::"VITAL SIGNS:  reviewed"}  ASSESSMENT & PLAN:    1. ***  COVID-19 Education: The signs and symptoms of COVID-19 were discussed with the patient and how to seek care for testing (follow up with PCP or arrange E-visit).  ***The importance of social distancing was discussed today.  Time:   Today, I have spent *** minutes with the patient with telehealth technology discussing the above problems.     Medication Adjustments/Labs and Tests Ordered: Current medicines are reviewed at length with the patient today.  Concerns regarding medicines are outlined above.   Tests Ordered: No orders of the defined types were placed in this encounter.   Medication Changes: No orders of the defined types were placed in this encounter.   Follow Up:  {F/U Format:[phone]} {follow up:15908}  Signed, Abigail Butts, PA-C  11/26/2018 8:05 AM    [facility]

## 2018-11-26 ENCOUNTER — Encounter: Payer: Self-pay | Admitting: Medical

## 2018-11-26 ENCOUNTER — Other Ambulatory Visit (HOSPITAL_COMMUNITY)
Admission: RE | Admit: 2018-11-26 | Discharge: 2018-11-26 | Disposition: A | Discharge status: Home / Self Care | Payer: Medicare Other | Source: Ambulatory Visit | Attending: Cardiovascular Disease | Admitting: Cardiovascular Disease

## 2018-11-26 ENCOUNTER — Ambulatory Visit (INDEPENDENT_AMBULATORY_CARE_PROVIDER_SITE_OTHER): Payer: Medicare Other | Admitting: Medical

## 2018-11-26 ENCOUNTER — Other Ambulatory Visit: Payer: Self-pay

## 2018-11-26 VITALS — BP 110/82 | HR 105 | Ht 72.0 in | Wt 250.2 lb

## 2018-11-26 DIAGNOSIS — Z20828 Contact with and (suspected) exposure to other viral communicable diseases: Secondary | ICD-10-CM | POA: Diagnosis not present

## 2018-11-26 DIAGNOSIS — Z01810 Encounter for preprocedural cardiovascular examination: Secondary | ICD-10-CM | POA: Diagnosis not present

## 2018-11-26 DIAGNOSIS — Z01812 Encounter for preprocedural laboratory examination: Secondary | ICD-10-CM | POA: Diagnosis not present

## 2018-11-26 DIAGNOSIS — I10 Essential (primary) hypertension: Secondary | ICD-10-CM | POA: Diagnosis not present

## 2018-11-26 DIAGNOSIS — R079 Chest pain, unspecified: Secondary | ICD-10-CM

## 2018-11-26 DIAGNOSIS — R0789 Other chest pain: Secondary | ICD-10-CM

## 2018-11-26 DIAGNOSIS — E782 Mixed hyperlipidemia: Secondary | ICD-10-CM

## 2018-11-26 DIAGNOSIS — M352 Behcet's disease: Secondary | ICD-10-CM

## 2018-11-26 LAB — CBC
Hematocrit: 44.7 % (ref 37.5–51.0)
Hemoglobin: 15.1 g/dL (ref 13.0–17.7)
MCH: 30.3 pg (ref 26.6–33.0)
MCHC: 33.8 g/dL (ref 31.5–35.7)
MCV: 90 fL (ref 79–97)
Platelets: 211 10*3/uL (ref 150–450)
RBC: 4.98 x10E6/uL (ref 4.14–5.80)
RDW: 12 % (ref 11.6–15.4)
WBC: 7 10*3/uL (ref 3.4–10.8)

## 2018-11-26 LAB — BASIC METABOLIC PANEL
BUN/Creatinine Ratio: 12 (ref 9–20)
BUN: 13 mg/dL (ref 6–24)
CO2: 22 mmol/L (ref 20–29)
Calcium: 9.7 mg/dL (ref 8.7–10.2)
Chloride: 100 mmol/L (ref 96–106)
Creatinine, Ser: 1.06 mg/dL (ref 0.76–1.27)
GFR calc Af Amer: 101 mL/min/{1.73_m2} (ref >59)
GFR calc non Af Amer: 87 mL/min/{1.73_m2} (ref >59)
Glucose: 103 mg/dL — ABNORMAL HIGH (ref 65–99)
Potassium: 4.3 mmol/L (ref 3.5–5.2)
Sodium: 140 mmol/L (ref 134–144)

## 2018-11-26 LAB — SARS CORONAVIRUS 2 (TAT 6-24 HRS): SARS Coronavirus 2: NEGATIVE

## 2018-11-26 MED ORDER — ATORVASTATIN CALCIUM 80 MG PO TABS: 80.0000 mg | ORAL_TABLET | Freq: Every day | ORAL | 3 refills | Status: DC

## 2018-11-26 MED ORDER — METOPROLOL TARTRATE 25 MG PO TABS: 12.5000 mg | ORAL_TABLET | Freq: Two times a day (BID) | ORAL | 3 refills | Status: DC

## 2018-11-26 NOTE — Patient Instructions (Addendum)
Medication Instructions:   START ASPIRIN 81 mg daily-Can buy over the counter  START METOPROLOL TARTRATE 12.5 mg 2 times a day  START ATORVASTATIN 80 mg daily  If you need a refill on your cardiac medications before your next appointment, please call your pharmacy.   Lab work: You will need to have labs drawn today:  BMET  CBC If you have labs (blood work) drawn today and your tests are completely normal, you will receive your results only by: Marland Kitchen MyChart Message (if you have MyChart) OR . A paper copy in the mail If you have any lab test that is abnormal or we need to change your treatment, we will call you to review the results.  Testing/Procedures: Your physician has requested that you have a cardiac catheterization. Cardiac catheterization is used to diagnose and/or treat various heart conditions. Doctors may recommend this procedure for a number of different reasons. The most common reason is to evaluate chest pain. Chest pain can be a symptom of coronary artery disease (CAD), and cardiac catheterization can show whether plaque is narrowing or blocking your heart's arteries. This procedure is also used to evaluate the valves, as well as measure the blood flow and oxygen levels in different parts of your heart. For further information please visit [facility]iest[facility][email or web address]. Please follow instruction sheet, as given.   Follow-Up: At [facility], you and your health needs are our priority.  As part of our continuing mission to provide you with exceptional heart care, we have created designated Provider Care Teams.  These Care Teams include your primary Cardiologist (physician) and Advanced Practice Providers (APPs -  Physician Assistants and Nurse Practitioners) who all work together to provide you with the care you need, when you need it. . Your physician recommends that you schedule a follow-up appointment in: 2 weeks with [name], PA-C  Any Other Special Instructions Will Be Listed  Below (If Applicable).  Wear compression stockings      [place] [address] Dept: [phone] Loc: [phone]  [name] [name]  11/26/2018  You are scheduled for a Cardiac Catheterization on Friday, August 28 with Dr. Kathlyn Sacramento.  1. Please arrive at the [facility] (Main Entrance A) at [facility]: [address] [place], [place] [address] at 8:30 AM (This time is two hours before your procedure to ensure your preparation). Free valet parking service is available.   Special note: Every effort is made to have your procedure done on time. Please understand that emergencies sometimes delay scheduled procedures.  2. Diet: Do not eat solid foods after midnight.  The patient may have clear liquids until 5am upon the day of the procedure.  3. Labs: You will need to have blood drawn on Tuesday, August 25 at [facility]  Open: 8am - 5pm (Lunch 12:30 - 1:30)   Phone: [phone]. You do not need to be fasting.  4. Medication instructions in preparation for your procedure:   Contrast Allergy: No   On the morning of your procedure, take your Aspirin and any morning medicines NOT listed above.  You may use sips of water.  5. Plan for one night stay--bring personal belongings. 6. Bring a current list of your medications and current insurance cards. 7. You MUST have a responsible person to drive you home. 8. Someone MUST be with you the first 24 hours after you arrive home  or your discharge will be delayed. 9. Please wear clothes that are easy to get on and off and wear slip-on shoes.  Thank you for allowing [place] to care for you!   --  Invasive Cardiovascular services

## 2018-11-26 NOTE — H&P (View-Only) (Signed)
Cardiology Office Note   Date:  11/26/2018   ID:  [name] [name], [name] [name] 10-11-1980, MRN [id number]  PCP:  Leighton Ruff, MD  Cardiologist:  Sanda Klein, MD EP: None  Chief Complaint  Patient presents with   Follow-up    chest pain      History of Present Illness: [name] [name] is a 38 y.o. male who is a former patient of Dr. Thurman Coyer. He has a PMH of ?cardiac arrest vs postictal state 12/2017 with negative cardiac work-up, HTN, seizure disorder, Behcet's disease, GERD, bipolar disorder, and migraines, who presents for follow-up of chest pain.   Patient was recently seen in the ED 11/07/2018 with complaints of chest pain. Chest pain occurred with walking about 1 hour after eating and was described as sharp pain. EKG was non-ischemic, Ddimer negative, and High sensitivity Trop was 3 x2. He was discharged from the ED and recommended to follow-up with Cardiology outpatient.   His last echocardiogram 12/2017 showed EF 65-70%, G1DD, no RWMA, and no significant valvular abnormalities. His last ischemic evaluation was a NST 02/2018 which showed no ischemia. He had a cardiac event monitor 01/2018 to further evaluate possible cardiac arrest 12/2017 which did not show any arrhythmias, only mild sinus tachycardia. He follows with CT Surgery at [facility] and is s/p xiphoid resection 09/2018 due to chronic xiphoid pain.   Patient reports exertional chest pain for the past month. He reports onset after 5-10 minutes of walking with left sided chest pressure associated with SOB and diaphoresis. He has also been experiencing some lightheadedness with position changes. No syncopal episodes. No complaints of orthopnea, LE edema, PND, or palpitations. He is quite anxious about his history of cardiac arrest and reports he is scared to exercise. He would like a test to evaluate for decreased blood flow to his heart. No significant family history of CAD though he notes his mother had  Takotsubo at age 74 and his father has HLD which has been difficult to manage despite being on multiple medications.      Past Medical History:  Diagnosis Date   Behcet's disease ([place])    Bipolar disorder ([place])    Cardiac arrest ([place]) 12/25/2017   one round of CPR    Hypertension    Migraine    Seizures ([place])    last experienced a seizure x 3 years ago   Vitamin D deficiency     Past Surgical History:  Procedure Laterality Date   APPENDECTOMY     FRACTURE SURGERY Right    Arm   maxilofacial     NISSEN FUNDOPLICATION       Current Outpatient Medications  Medication Sig Dispense Refill   ALPRAZolam (XANAX) 0.5 MG tablet Take 2 tablets approximately 45 minutes prior to the MRI study, take a third tablet if needed. 3 tablet 0   Apremilast (OTEZLA) 30 MG TABS Take 1 tablet by mouth 2 (two) times daily.     clonazePAM (KLONOPIN) 0.5 MG tablet Take 0.5 mg by mouth 3 (three) times daily as needed for anxiety.     FLUoxetine (PROZAC) 20 MG/5ML solution Take 80 mg by mouth daily.      lamoTRIgine 200 MG TBDP DISSOLVE 1 TABLET BY MOUTH TWICE DAILY(MORNING AND BEDTIME) (Patient taking differently: Take 200 mg by mouth 2 (two) times daily. ) 180 tablet 2   levETIRAcetam (KEPPRA) 100 MG/ML solution TAKE 7 ML(700 MG) BY MOUTH TWICE DAILY (Patient taking differently: Take 700 mg  by mouth 2 (two) times daily. ) 1260 mL 2   mycophenolate (CELLCEPT) 500 MG tablet Take 1,000 mg by mouth 2 (two) times daily.   2   QUEtiapine (SEROQUEL) 200 MG tablet Take 200 mg by mouth at bedtime.     Vitamin D, Ergocalciferol, (DRISDOL) 1.25 MG (50000 UT) CAPS capsule TK 1 C PO 1 TIME A WK     atorvastatin (LIPITOR) 80 MG tablet Take 1 tablet (80 mg total) by mouth daily. 90 tablet 3   metoprolol tartrate (LOPRESSOR) 25 MG tablet Take 0.5 tablets (12.5 mg total) by mouth 2 (two) times daily. 30 tablet 3   No current facility-administered medications for this visit.     Allergies:    Patient has no known allergies.    Social History:  The patient  reports that he has never smoked. He has never used smokeless tobacco. He reports that he does not drink alcohol or use drugs.   Family History:  The patient's family history includes Lung cancer in his paternal grandfather; Other in his father.    ROS:  Please see the history of present illness.   Otherwise, review of systems are positive for none.   All other systems are reviewed and negative.    PHYSICAL EXAM: VS:  BP 110/82    Pulse (!) 105    Ht 6' (1.829 m)    Wt 250 lb 3.2 oz (113.5 kg)    BMI 33.93 kg/m  , BMI Body mass index is 33.93 kg/m. GEN: Well nourished, well developed, in no acute distress HEENT: sclera anicteric Neck: no JVD, carotid bruits, or masses Cardiac: RRR; no murmurs, rubs, or gallops, no edema  Respiratory:  clear to auscultation bilaterally, normal work of breathing GI: soft, obese, nontender, nondistended, + BS MS: no deformity or atrophy Skin: warm and dry, behcet lesion on LLE and LUE.  Neuro:  Strength and sensation are intact Psych: euthymic mood, full affect   EKG:  EKG is ordered today. The ekg ordered today demonstrates sinus tachycardia with rate 105 bpm, non-specific T wave abnormalities, no STE/D, QTc446   Recent Labs: 12/28/2017: Magnesium 2.1 05/28/2018: ALT 22 11/07/2018: BUN 16; Creatinine, Ser 1.04; Hemoglobin 15.6; Platelets 191; Potassium 3.6; Sodium 139    Lipid Panel    Component Value Date/Time   TRIG 405 (H) 12/27/2017 0518      Wt Readings from Last 3 Encounters:  11/26/18 250 lb 3.2 oz (113.5 kg)  11/12/18 254 lb (115.2 kg)  06/10/18 230 lb (104.3 kg)      Other studies Reviewed: Additional studies/ records that were reviewed today include:   Echocardiogram 12/2017: - Left ventricle: The cavity size was normal. Wall thickness was normal. Systolic function was vigorous. The estimated ejection fraction was in the range of 65% to 70%. Wall motion  was normal; there were no regional wall motion abnormalities. Doppler parameters are consistent with abnormal left ventricular relaxation (grade 1 diastolic dysfunction). The E/e&' ratio is between 8-15, suggesting indeterminate LV filling pressure. - Mitral valve: Mildly thickened leaflets . There was trivial regurgitation. - Left atrium: The atrium was normal in size. - Inferior vena cava: The vessel was normal in size. The respirophasic diameter changes were in the normal range (>= 50%), consistent with normal central venous pressure.  Impressions:  - LVEF 65-70%, normal wall thickness, normal regional wall motion, grade 1 DD, indeterminate LV filling pressure, trivial MR, normal LA size, normal IVC.  Cardiac event monitor 01/2018:  No rhythm  abnormalities are seen during the recording.  The rhythm is normal sinus with periods of sinus tachycardia during daytime hours, normal circadian variation.  A few patient triggered recordings are submitted, all of them associated with mild sinus tachycardia.  Normal 14-day event monitor. Symptoms are associated with mild sinus tachycardia.  NST 02/2018:  Nuclear stress EF: 64%.  The left ventricular ejection fraction is normal (55-65%).  Stress EKG showed no change from baseline EKG showing downsloping ST segment depression in the inferolateral leads  The study is normal.  This is a low risk study.    ASSESSMENT AND PLAN:  1. Chest pain: patient presented to ED 11/07/2018 with atypical chest pain. Today he reports classic anginal symptoms of chest pressure with exertion associated with SOB and diaphoresis. Stress test 02/2018 without ischemia. Echo 12/2017 with EF 65-70%, no RWMA, G1DD, and no significant valvular abnormalities. CT chest 10/2018 with mild coronary artery calcifications noted. Risk factors for CAD include HTN, HLD, hypertriglyceridemia, and obesity. Given exertional symptoms and recent negative  stress test, favor a definitive evaluation as our next best step. - Plan for [place] with Dr. Fletcher Anon on Friday 8/28 to further evaluate chest pain.  - Will start aspirin 81mg  daily given mention of CAD on prior CT scans - Will start atorvastatin 80mg  daily given HLD - Will start metoprolol tartrate 12.5mg  BID for antianginal effects, tachycardia, and HTN.  2. HTN: BP elevated at recent ER visit. Not on any antihypertensive medications. BP improved to 110/82 today. Orthostatics negative with highest BP 125/90. - Will start low dose metoprolol 12.5mg  BID given chest pain and tachycardia.   3. HLD: recent lipid panel 11/11/2018 with Tcholesterol 268, LDL 163, HDL 44, and triglycerides 306. Suspect this is familial HLD as he reports his father is on 3 medications to control his BP.  - Will start atorvastatin 80mg  daily for risk factor modification  4. Behcet's disease: does not appear to accelerate atherosclerosis to the extent of other autoimmune diseases (lupus, RA)  Plan discussed with Dr. Debara Pickett (DOD), who is in agreement.     Current medicines are reviewed at length with the patient today.  The patient does not have concerns regarding medicines.  The following changes have been made:  Will plan for LHC this week to evaluate chest pain. Start aspirin 81mg  daily, atorvastatin 80mg  daily, and metoprolol tartrate 12.5mg  BID  Labs/ tests ordered today include:   Orders Placed This Encounter  Procedures   CBC   Basic metabolic panel   EKG [id number]     Disposition:   FU with me in 2 weeks  Signed, Abigail Butts, PA-C  11/26/2018 10:04 AM

## 2018-11-26 NOTE — Progress Notes (Signed)
Cardiology Office Note   Date:  11/26/2018   ID:  [name] [name] 11/30/1980, MRN [id number]  PCP:  [name] Ruff, MD  Cardiologist:  [name] Klein, MD EP: None  Chief Complaint  Patient presents with   Follow-up    chest pain      History of Present Illness: [name] [name] is a 39 y.o. male who is a former patient of Dr. Thurman [name]. He has a PMH of ?cardiac arrest vs postictal state 12/2017 with negative cardiac work-up, HTN, seizure disorder, Behcet's disease, GERD, bipolar disorder, and migraines, who presents for follow-up of chest pain.   Patient was recently seen in the ED 11/07/2018 with complaints of chest pain. Chest pain occurred with walking about 1 hour after eating and was described as sharp pain. EKG was non-ischemic, Ddimer negative, and High sensitivity Trop was 3 x2. He was discharged from the ED and recommended to follow-up with Cardiology outpatient.   His last echocardiogram 12/2017 showed EF 65-70%, G1DD, no RWMA, and no significant valvular abnormalities. His last ischemic evaluation was a NST 02/2018 which showed no ischemia. He had a cardiac event monitor 01/2018 to further evaluate possible cardiac arrest 12/2017 which did not show any arrhythmias, only mild sinus tachycardia. He follows with CT Surgery at [facility] and is s/p xiphoid resection 09/2018 due to chronic xiphoid pain.   Patient reports exertional chest pain for the past month. He reports onset after 5-10 minutes of walking with left sided chest pressure associated with SOB and diaphoresis. He has also been experiencing some lightheadedness with position changes. No syncopal episodes. No complaints of orthopnea, LE edema, PND, or palpitations. He is quite anxious about his history of cardiac arrest and reports he is scared to exercise. He would like a test to evaluate for decreased blood flow to his heart. No significant family history of CAD though he notes his mother had  Takotsubo at age 95 and his father has HLD which has been difficult to manage despite being on multiple medications.      Past Medical History:  Diagnosis Date   Behcet's disease ([place])    Bipolar disorder ([place])    Cardiac arrest ([place]) 12/25/2017   one round of CPR    Hypertension    Migraine    Seizures ([place])    last experienced a seizure x 3 years ago   Vitamin D deficiency     Past Surgical History:  Procedure Laterality Date   APPENDECTOMY     FRACTURE SURGERY Right    Arm   maxilofacial     NISSEN FUNDOPLICATION       Current Outpatient Medications  Medication Sig Dispense Refill   ALPRAZolam (XANAX) 0.5 MG tablet Take 2 tablets approximately 45 minutes prior to the MRI study, take a third tablet if needed. 3 tablet 0   Apremilast (OTEZLA) 30 MG TABS Take 1 tablet by mouth 2 (two) times daily.     clonazePAM (KLONOPIN) 0.5 MG tablet Take 0.5 mg by mouth 3 (three) times daily as needed for anxiety.     FLUoxetine (PROZAC) 20 MG/5ML solution Take 80 mg by mouth daily.      lamoTRIgine 200 MG TBDP DISSOLVE 1 TABLET BY MOUTH TWICE DAILY(MORNING AND BEDTIME) (Patient taking differently: Take 200 mg by mouth 2 (two) times daily. ) 180 tablet 2   levETIRAcetam (KEPPRA) 100 MG/ML solution TAKE 7 ML(700 MG) BY MOUTH TWICE DAILY (Patient taking differently: Take 700 mg  by mouth 2 (two) times daily. ) 1260 mL 2   mycophenolate (CELLCEPT) 500 MG tablet Take 1,000 mg by mouth 2 (two) times daily.   2   QUEtiapine (SEROQUEL) 200 MG tablet Take 200 mg by mouth at bedtime.     Vitamin D, Ergocalciferol, (DRISDOL) 1.25 MG (50000 UT) CAPS capsule TK 1 C PO 1 TIME A WK     atorvastatin (LIPITOR) 80 MG tablet Take 1 tablet (80 mg total) by mouth daily. 90 tablet 3   metoprolol tartrate (LOPRESSOR) 25 MG tablet Take 0.5 tablets (12.5 mg total) by mouth 2 (two) times daily. 30 tablet 3   No current facility-administered medications for this visit.     Allergies:    Patient has no known allergies.    Social History:  The patient  reports that he has never smoked. He has never used smokeless tobacco. He reports that he does not drink alcohol or use drugs.   Family History:  The patient's family history includes Lung cancer in his paternal grandfather; Other in his father.    ROS:  Please see the history of present illness.   Otherwise, review of systems are positive for none.   All other systems are reviewed and negative.    PHYSICAL EXAM: VS:  BP 110/82    Pulse (!) 105    Ht 6' (1.829 m)    Wt 250 lb 3.2 oz (113.5 kg)    BMI 33.93 kg/m  , BMI Body mass index is 33.93 kg/m. GEN: Well nourished, well developed, in no acute distress HEENT: sclera anicteric Neck: no JVD, carotid bruits, or masses Cardiac: RRR; no murmurs, rubs, or gallops, no edema  Respiratory:  clear to auscultation bilaterally, normal work of breathing GI: soft, obese, nontender, nondistended, + BS MS: no deformity or atrophy Skin: warm and dry, behcet lesion on LLE and LUE.  Neuro:  Strength and sensation are intact Psych: euthymic mood, full affect   EKG:  EKG is ordered today. The ekg ordered today demonstrates sinus tachycardia with rate 105 bpm, non-specific T wave abnormalities, no STE/D, QTc446   Recent Labs: 12/28/2017: Magnesium 2.1 05/28/2018: ALT 22 11/07/2018: BUN 16; Creatinine, Ser 1.04; Hemoglobin 15.6; Platelets 191; Potassium 3.6; Sodium 139    Lipid Panel    Component Value Date/Time   TRIG 405 (H) 12/27/2017 0518      Wt Readings from Last 3 Encounters:  11/26/18 250 lb 3.2 oz (113.5 kg)  11/12/18 254 lb (115.2 kg)  06/10/18 230 lb (104.3 kg)      Other studies Reviewed: Additional studies/ records that were reviewed today include:   Echocardiogram 12/2017: - Left ventricle: The cavity size was normal. Wall thickness was normal. Systolic function was vigorous. The estimated ejection fraction was in the range of 65% to 70%. Wall motion  was normal; there were no regional wall motion abnormalities. Doppler parameters are consistent with abnormal left ventricular relaxation (grade 1 diastolic dysfunction). The E/e&' ratio is between 8-15, suggesting indeterminate LV filling pressure. - Mitral valve: Mildly thickened leaflets . There was trivial regurgitation. - Left atrium: The atrium was normal in size. - Inferior vena cava: The vessel was normal in size. The respirophasic diameter changes were in the normal range (>= 50%), consistent with normal central venous pressure.  Impressions:  - LVEF 65-70%, normal wall thickness, normal regional wall motion, grade 1 DD, indeterminate LV filling pressure, trivial MR, normal LA size, normal IVC.  Cardiac event monitor 01/2018:  No rhythm  abnormalities are seen during the recording.  The rhythm is normal sinus with periods of sinus tachycardia during daytime hours, normal circadian variation.  A few patient triggered recordings are submitted, all of them associated with mild sinus tachycardia.  Normal 14-day event monitor. Symptoms are associated with mild sinus tachycardia.  NST 02/2018:  Nuclear stress EF: 64%.  The left ventricular ejection fraction is normal (55-65%).  Stress EKG showed no change from baseline EKG showing downsloping ST segment depression in the inferolateral leads  The study is normal.  This is a low risk study.    ASSESSMENT AND PLAN:  1. Chest pain: patient presented to ED 11/07/2018 with atypical chest pain. Today he reports classic anginal symptoms of chest pressure with exertion associated with SOB and diaphoresis. Stress test 02/2018 without ischemia. Echo 12/2017 with EF 65-70%, no RWMA, G1DD, and no significant valvular abnormalities. CT chest 10/2018 with mild coronary artery calcifications noted. Risk factors for CAD include HTN, HLD, hypertriglyceridemia, and obesity. Given exertional symptoms and recent negative  stress test, favor a definitive evaluation as our next best step. - Plan for [place] with Dr. Fletcher Anon on Friday 8/28 to further evaluate chest pain.  - Will start aspirin 81mg  daily given mention of CAD on prior CT scans - Will start atorvastatin 80mg  daily given HLD - Will start metoprolol tartrate 12.5mg  BID for antianginal effects, tachycardia, and HTN.  2. HTN: BP elevated at recent ER visit. Not on any antihypertensive medications. BP improved to 110/82 today. Orthostatics negative with highest BP 125/90. - Will start low dose metoprolol 12.5mg  BID given chest pain and tachycardia.   3. HLD: recent lipid panel 11/11/2018 with Tcholesterol 268, LDL 163, HDL 44, and triglycerides 306. Suspect this is familial HLD as he reports his father is on 3 medications to control his BP.  - Will start atorvastatin 80mg  daily for risk factor modification  4. Behcet's disease: does not appear to accelerate atherosclerosis to the extent of other autoimmune diseases (lupus, RA)  Plan discussed with Dr. Debara Pickett (DOD), who is in agreement.     Current medicines are reviewed at length with the patient today.  The patient does not have concerns regarding medicines.  The following changes have been made:  Will plan for [facility] this week to evaluate chest pain. Start aspirin 81mg  daily, atorvastatin 80mg  daily, and metoprolol tartrate 12.5mg  BID  Labs/ tests ordered today include:   Orders Placed This Encounter  Procedures   CBC   Basic metabolic panel   EKG [id number]     Disposition:   FU with me in 2 weeks  Signed, Abigail Butts, PA-C  11/26/2018 10:04 AM

## 2018-11-27 ENCOUNTER — Ambulatory Visit: Payer: Medicare Other | Admitting: Neurology

## 2018-11-28 ENCOUNTER — Telehealth: Payer: Self-pay | Admitting: *Deleted

## 2018-11-28 NOTE — Telephone Encounter (Signed)
Pt contacted pre-catheterization scheduled at [facility] for: Friday November 29, 2018 10:30 AM Verified arrival time and place: [facility]) at: 8:30 AM   No solid food after midnight prior to cath, clear liquids until 5 AM day of procedure. Contrast allergy:  no   AM meds can be  taken pre-cath with sip of water including: ASA 81 mg   Confirmed patient has responsible person to drive home post procedure and observe 24 hours after arriving home: yes  Currently, due to Covid-19 pandemic, only one support person will be allowed with patient. Must be the same support person for that patient's entire stay, will be screened and required to wear a mask. They will be asked to wait in the waiting room for the duration of the patient's stay.  Patients are required to wear a mask when they enter the hospital.       COVID-19 Pre-Screening Questions:  . In the past 7 to 10 days have you had a cough,  shortness of breath, headache, congestion, fever (100 or greater) body aches, chills, sore throat, or sudden loss of taste or sense of smell? no . Have you been around anyone with known Covid 19? no . Have you been around anyone who is awaiting Covid 19 test results in the past 7 to 10 days? no . Have you been around anyone who has been exposed to Covid 19, or has mentioned symptoms of Covid 19 within the past 7 to 10 days? no  I reviewed procedure/mask/visitor, Covid-19 screening questions with patient, he verbalized understanding, thanked me for call.

## 2018-11-29 ENCOUNTER — Encounter (HOSPITAL_COMMUNITY)
Admission: RE | Disposition: A | Discharge status: Home / Self Care | Payer: Medicare Other | Source: Home / Self Care | Attending: Cardiovascular Disease

## 2018-11-29 ENCOUNTER — Ambulatory Visit (HOSPITAL_COMMUNITY)
Admission: RE | Admit: 2018-11-29 | Discharge: 2018-11-29 | Disposition: A | Discharge status: Home / Self Care | Payer: Medicare Other | Attending: Cardiovascular Disease | Admitting: Cardiovascular Disease

## 2018-11-29 ENCOUNTER — Other Ambulatory Visit: Payer: Self-pay

## 2018-11-29 DIAGNOSIS — R079 Chest pain, unspecified: Secondary | ICD-10-CM | POA: Diagnosis present

## 2018-11-29 DIAGNOSIS — G43909 Migraine, unspecified, not intractable, without status migrainosus: Secondary | ICD-10-CM | POA: Diagnosis not present

## 2018-11-29 DIAGNOSIS — E785 Hyperlipidemia, unspecified: Secondary | ICD-10-CM | POA: Insufficient documentation

## 2018-11-29 DIAGNOSIS — F319 Bipolar disorder, unspecified: Secondary | ICD-10-CM | POA: Diagnosis not present

## 2018-11-29 DIAGNOSIS — M352 Behcet's disease: Secondary | ICD-10-CM | POA: Insufficient documentation

## 2018-11-29 DIAGNOSIS — E559 Vitamin D deficiency, unspecified: Secondary | ICD-10-CM | POA: Insufficient documentation

## 2018-11-29 DIAGNOSIS — I10 Essential (primary) hypertension: Secondary | ICD-10-CM | POA: Diagnosis not present

## 2018-11-29 DIAGNOSIS — K219 Gastro-esophageal reflux disease without esophagitis: Secondary | ICD-10-CM | POA: Insufficient documentation

## 2018-11-29 DIAGNOSIS — Z8674 Personal history of sudden cardiac arrest: Secondary | ICD-10-CM | POA: Diagnosis not present

## 2018-11-29 DIAGNOSIS — Z79899 Other long term (current) drug therapy: Secondary | ICD-10-CM | POA: Diagnosis not present

## 2018-11-29 DIAGNOSIS — G40909 Epilepsy, unspecified, not intractable, without status epilepticus: Secondary | ICD-10-CM | POA: Diagnosis not present

## 2018-11-29 HISTORY — PX: LEFT HEART CATH AND CORONARY ANGIOGRAPHY: CATH118249

## 2018-11-29 SURGERY — LEFT HEART CATH AND CORONARY ANGIOGRAPHY
Anesthesia: LOCAL

## 2018-11-29 MED ORDER — FENTANYL CITRATE (PF) 100 MCG/2ML IJ SOLN
INTRAMUSCULAR | Status: AC
  Filled 2018-11-29: qty 2

## 2018-11-29 MED ORDER — SODIUM CHLORIDE 0.9% FLUSH: 3.0000 mL | Freq: Two times a day (BID) | INTRAVENOUS | Status: DC

## 2018-11-29 MED ORDER — LIDOCAINE HCL (PF) 1 % IJ SOLN
INTRAMUSCULAR | Status: DC | PRN
  Administered 2018-11-29: 2 mL

## 2018-11-29 MED ORDER — FENTANYL CITRATE (PF) 100 MCG/2ML IJ SOLN
INTRAMUSCULAR | Status: DC | PRN
  Administered 2018-11-29: 25 ug via INTRAVENOUS

## 2018-11-29 MED ORDER — IOHEXOL 350 MG/ML SOLN
INTRAVENOUS | Status: DC | PRN
  Administered 2018-11-29: 75 mL via INTRA_ARTERIAL

## 2018-11-29 MED ORDER — VERAPAMIL HCL 2.5 MG/ML IV SOLN
INTRAVENOUS | Status: DC | PRN
  Administered 2018-11-29: 13:00:00 10 mL via INTRA_ARTERIAL

## 2018-11-29 MED ORDER — MIDAZOLAM HCL 2 MG/2ML IJ SOLN
INTRAMUSCULAR | Status: AC
  Filled 2018-11-29: qty 2

## 2018-11-29 MED ORDER — SODIUM CHLORIDE 0.9% FLUSH: 3.0000 mL | INTRAVENOUS | Status: DC | PRN

## 2018-11-29 MED ORDER — SODIUM CHLORIDE 0.9 % IV SOLN: 250.0000 mL | INTRAVENOUS | Status: DC | PRN

## 2018-11-29 MED ORDER — ASPIRIN 81 MG PO CHEW: 81.0000 mg | CHEWABLE_TABLET | ORAL | Status: DC

## 2018-11-29 MED ORDER — LIDOCAINE HCL (PF) 1 % IJ SOLN
INTRAMUSCULAR | Status: AC
  Filled 2018-11-29: qty 30

## 2018-11-29 MED ORDER — SODIUM CHLORIDE 0.9 % WEIGHT BASED INFUSION: 1.0000 mL/kg/h | INTRAVENOUS | Status: DC

## 2018-11-29 MED ORDER — HEPARIN (PORCINE) IN NACL 1000-0.9 UT/500ML-% IV SOLN
INTRAVENOUS | Status: DC | PRN
  Administered 2018-11-29 (×2): 500 mL

## 2018-11-29 MED ORDER — VERAPAMIL HCL 2.5 MG/ML IV SOLN
INTRAVENOUS | Status: AC
  Filled 2018-11-29: qty 2

## 2018-11-29 MED ORDER — HEPARIN SODIUM (PORCINE) 1000 UNIT/ML IJ SOLN
INTRAMUSCULAR | Status: DC | PRN
  Administered 2018-11-29: 6000 [IU] via INTRAVENOUS

## 2018-11-29 MED ORDER — HEPARIN (PORCINE) IN NACL 1000-0.9 UT/500ML-% IV SOLN
INTRAVENOUS | Status: AC
  Filled 2018-11-29: qty 1000

## 2018-11-29 MED ORDER — SODIUM CHLORIDE 0.9 % WEIGHT BASED INFUSION
3.0000 mL/kg/h | INTRAVENOUS | Status: DC
  Administered 2018-11-29: 09:00:00 3 mL/kg/h via INTRAVENOUS

## 2018-11-29 MED ORDER — MIDAZOLAM HCL 2 MG/2ML IJ SOLN
INTRAMUSCULAR | Status: DC | PRN
  Administered 2018-11-29: 2 mg via INTRAVENOUS

## 2018-11-29 SURGICAL SUPPLY — 10 items
CATH 5FR JL3.5 JR4 ANG PIG MP (CATHETERS) ×1 IMPLANT
DEVICE RAD COMP TR BAND LRG (VASCULAR PRODUCTS) ×1 IMPLANT
GLIDESHEATH SLEND SS 6F .021 (SHEATH) ×1 IMPLANT
GUIDEWIRE INQWIRE 1.5J.035X260 (WIRE) IMPLANT
INQWIRE 1.5J .035X260CM (WIRE) ×2
KIT HEART LEFT (KITS) ×2 IMPLANT
PACK CARDIAC CATHETERIZATION (CUSTOM PROCEDURE TRAY) ×2 IMPLANT
SYR MEDRAD MARK 7 150ML (SYRINGE) ×2 IMPLANT
TRANSDUCER W/STOPCOCK (MISCELLANEOUS) ×2 IMPLANT
TUBING CIL FLEX 10 FLL-RA (TUBING) ×2 IMPLANT

## 2018-11-29 NOTE — Interval H&P Note (Signed)
History and Physical Interval Note:  11/29/2018 1:03 PM  [name] [name]  has presented today for surgery, with the diagnosis of chest pain.  The various methods of treatment have been discussed with the patient and family. After consideration of risks, benefits and other options for treatment, the patient has consented to  Procedure(s): LEFT HEART CATH AND CORONARY ANGIOGRAPHY (N/A) as a surgical intervention.  The patient's history has been reviewed, patient examined, no change in status, stable for surgery.  I have reviewed the patient's chart and labs.  Questions were answered to the patient's satisfaction.   Cath Lab Visit (complete for each Cath Lab visit)  Clinical Evaluation Leading to the Procedure:   ACS: No.  Non-ACS:    Anginal Classification: CCS III  Anti-ischemic medical therapy: Minimal Therapy (1 class of medications)  Non-Invasive Test Results: No non-invasive testing performed  Prior CABG: No previous CABG        [name] [name] [facility] 11/29/2018 1:03 PM

## 2018-11-29 NOTE — Progress Notes (Signed)
Thank you :)

## 2018-11-29 NOTE — Discharge Instructions (Signed)
Radial Site Care ° °This sheet gives you information about how to care for yourself after your procedure. Your health care provider may also give you more specific instructions. If you have problems or questions, contact your health care provider. °What can I expect after the procedure? °After the procedure, it is common to have: °· Bruising and tenderness at the catheter insertion area. °Follow these instructions at home: °Medicines °· Take over-the-counter and prescription medicines only as told by your health care provider. °Insertion site care °· Follow instructions from your health care provider about how to take care of your insertion site. Make sure you: °? Wash your hands with soap and water before you change your bandage (dressing). If soap and water are not available, use hand sanitizer. °? Change your dressing as told by your health care provider. °? Leave stitches (sutures), skin glue, or adhesive strips in place. These skin closures may need to stay in place for 2 weeks or longer. If adhesive strip edges start to loosen and curl up, you may trim the loose edges. Do not remove adhesive strips completely unless your health care provider tells you to do that. °· Check your insertion site every day for signs of infection. Check for: °? Redness, swelling, or pain. °? Fluid or blood. °? Pus or a bad smell. °? Warmth. °· Do not take baths, swim, or use a hot tub until your health care provider approves. °· You may shower 24-48 hours after the procedure, or as directed by your health care provider. °? Remove the dressing and gently wash the site with plain soap and water. °? Pat the area dry with a clean towel. °? Do not rub the site. That could cause bleeding. °· Do not apply powder or lotion to the site. °Activity ° °· For 24 hours after the procedure, or as directed by your health care provider: °? Do not flex or bend the affected arm. °? Do not push or pull heavy objects with the affected arm. °? Do not  drive yourself home from the hospital or clinic. You may drive 24 hours after the procedure unless your health care provider tells you not to. °? Do not operate machinery or power tools. °· Do not lift anything that is heavier than 10 lb (4.5 kg), or the limit that you are told, until your health care provider says that it is safe. °· Ask your health care provider when it is okay to: °? Return to work or school. °? Resume usual physical activities or sports. °? Resume sexual activity. °General instructions °· If the catheter site starts to bleed, raise your arm and put firm pressure on the site. If the bleeding does not stop, get help right away. This is a medical emergency. °· If you went home on the same day as your procedure, a responsible adult should be with you for the first 24 hours after you arrive home. °· Keep all follow-up visits as told by your health care provider. This is important. °Contact a health care provider if: °· You have a fever. °· You have redness, swelling, or yellow drainage around your insertion site. °Get help right away if: °· You have unusual pain at the radial site. °· The catheter insertion area swells very fast. °· The insertion area is bleeding, and the bleeding does not stop when you hold steady pressure on the area. °· Your arm or hand becomes pale, cool, tingly, or numb. °These symptoms may represent a serious problem   that is an emergency. Do not wait to see if the symptoms will go away. Get medical help right away. Call your local emergency services (911 in the [place] Do not drive yourself to the hospital. °Summary °· After the procedure, it is common to have bruising and tenderness at the site. °· Follow instructions from your health care provider about how to take care of your radial site wound. Check the wound every day for signs of infection. °· Do not lift anything that is heavier than 10 lb (4.5 kg), or the limit that you are told, until your health care provider says  that it is safe. °This information is not intended to replace advice given to you by your health care provider. Make sure you discuss any questions you have with your health care provider. °Document Released: 04/22/2010 Document Revised: 04/25/2017 Document Reviewed: 04/25/2017 °Elsevier Patient Education © 2020 [employer] Inc[employer] ° °

## 2018-12-02 ENCOUNTER — Encounter (HOSPITAL_COMMUNITY): Payer: Self-pay | Admitting: Cardiology

## 2018-12-09 NOTE — Progress Notes (Signed)
Cardiology Office Note   Date:  12/11/2018   ID:  BRETON ROMINE, DOB March 03, 1981, MRN [id number]  PCP:  Leighton Ruff, MD  Cardiologist:  Sanda Klein, MD EP: None  Chief Complaint  Patient presents with  . Follow-up    chest pain      History of Present Illness: [name] [name] is a 38 y.o. male who is a former patient of Dr. Thurman Coyer. He has a PMH of ?cardiac arrest vs postictal state 12/2017 with negative cardiac work-up, HTN, seizure disorder, Behcet's disease, GERD, bipolar disorder, and migraines, who presents for follow-up of chest pain.   He was last evaluated by me 11/26/2018, at which time he had complaints of exertional chest pain. Given recent negative stress test, he was recommended to undergo a LHC for definitive evaluation. LHC performed 11/29/2018 showed normal coronary anatomy, normal LV function, and normal LVEDP.   He returns today with improvement in exertional chest pain. He is still experiencing DOE which I suspect is related to deconditioning. He is still experiencing intermittent positional lightheadedness and reports falling into his bed frame the other night shortly after getting out of bed, resulting in a bruise on his left buttock. He denies LOC. We discussed his catheterization results and he continues to be concerned about his EKG despite reassurance that his coronary arteries were normal. No complaints of orthopnea, PND, or LE edema.    Past Medical History:  Diagnosis Date  . Behcet's disease ([place])   . Bipolar disorder ([place])   . Cardiac arrest ([place]) 12/25/2017   one round of CPR   . Hypertension   . Migraine   . Seizures ([place])    last experienced a seizure x 3 years ago  . Vitamin D deficiency     Past Surgical History:  Procedure Laterality Date  . APPENDECTOMY    . FRACTURE SURGERY Right    Arm  . LEFT HEART CATH AND CORONARY ANGIOGRAPHY N/A 11/29/2018   Procedure: LEFT HEART CATH AND CORONARY ANGIOGRAPHY;  Surgeon: Martinique, Peter M, MD;   Location: [facility];  Service: Cardiovascular;  Laterality: N/A;  . maxilofacial    . NISSEN FUNDOPLICATION       Current Outpatient Medications  Medication Sig Dispense Refill  . ALPRAZolam (XANAX) 0.5 MG tablet Take 2 tablets approximately 45 minutes prior to the MRI study, take a third tablet if needed. 3 tablet 0  . Apremilast (OTEZLA) 30 MG TABS Take 30 mg by mouth 2 (two) times daily.     Marland Kitchen atorvastatin (LIPITOR) 40 MG tablet Take 1 tablet (40 mg total) by mouth daily. 90 tablet 3  . clonazePAM (KLONOPIN) 0.5 MG tablet Take 0.25 mg by mouth 3 (three) times daily.     Marland Kitchen FLUoxetine (PROZAC) 20 MG/5ML solution Take 80 mg by mouth daily.     Marland Kitchen lamoTRIgine (LAMICTAL ODT) 200 MG TBDP Take 200 mg by mouth 2 (two) times daily.    Marland Kitchen levETIRAcetam (KEPPRA) 100 MG/ML solution TAKE 7 ML(700 MG) BY MOUTH TWICE DAILY (Patient taking differently: Take 700 mg by mouth 2 (two) times daily. ) 1260 mL 2  . metoprolol tartrate (LOPRESSOR) 25 MG tablet Take 0.5 tablets (12.5 mg total) by mouth 2 (two) times daily. 30 tablet 3  . mycophenolate (CELLCEPT) 500 MG tablet Take 1,000 mg by mouth 2 (two) times daily.   2  . QUEtiapine (SEROQUEL) 200 MG tablet Take 200 mg by mouth at bedtime.    . Vitamin  D, Ergocalciferol, (DRISDOL) 1.25 MG (50000 UT) CAPS capsule Take 50,000 Units by mouth every Thursday.      No current facility-administered medications for this visit.     Allergies:   Patient has no known allergies.    Social History:  The patient  reports that he has never smoked. He has never used smokeless tobacco. He reports that he does not drink alcohol or use drugs.   Family History:  The patient's family history includes Lung cancer in his paternal grandfather; Other in his father.    ROS:  Please see the history of present illness.   Otherwise, review of systems are positive for none.   All other systems are reviewed and negative.    PHYSICAL EXAM: VS:  BP 133/90   Pulse 96   Ht  6' (1.829 m)   Wt 249 lb 9.6 oz (113.2 kg)   BMI 33.85 kg/m  , BMI Body mass index is 33.85 kg/m. GEN: Well nourished, well developed, in no acute distress HEENT: sclera anicteric Neck: no JVD, carotid bruits, or masses Cardiac: RRR; no murmurs, rubs, or gallops, no edema  Respiratory:  clear to auscultation bilaterally, normal work of breathing GI: soft, nontender, nondistended, + BS MS: no deformity or atrophy Skin: warm and dry, no rash; + bruise to left buttock. Neuro:  Strength and sensation are intact Psych: euthymic mood, full affect   EKG:  EKG is not ordered today.   Recent Labs: 12/28/2017: Magnesium 2.1 05/28/2018: ALT 22 11/26/2018: BUN 13; Creatinine, Ser 1.06; Hemoglobin 15.1; Platelets 211; Potassium 4.3; Sodium 140    Lipid Panel    Component Value Date/Time   TRIG 405 (H) 12/27/2017 0518      Wt Readings from Last 3 Encounters:  12/11/18 249 lb 9.6 oz (113.2 kg)  11/29/18 247 lb (112 kg)  11/26/18 250 lb 3.2 oz (113.5 kg)      Other studies Reviewed: Additional studies/ records that were reviewed today include:   Left heart catheterization 11/29/2018:  The left ventricular systolic function is normal.  LV end diastolic pressure is normal.  The left ventricular ejection fraction is 55-65% by visual estimate.   1. Normal coronary anatomy 2. Normal LV function 3. Normal LVEDP  Plan: medical Rx.     ASSESSMENT AND PLAN:  1. Chest pain: normal coronary anatomy on [facility] 11/29/2018. Possible chest pain is 2/2 deconditioning and/or anxiety related. No indication for aspirin at this time so will discontinue to minimize bleeding risk.  - Continue atorvastatin for risk factor modification   2. Lightheadedness: occasional lightheadedness with position changes. Orthostatics today were again negative (laying: 123/85, sitting 129/85, standing 66min 117/87, and standing 69min 119/84; appropriate HR response). - Encouraged compression stocking use and  positional change habits of waiting 1-2 minute prior to ambulation.   3. HTN: BP 133/90 today - Continue metoprolol 12.5g BID  4. HLD: LDL 163 on labs 11/11/2018. Started on atorvastatin 80mg  daily 11/26/2018 - Will deescalate atorvastatin to 40mg  daily given normal coronary anatomy - Plan to recheck FLP and LFTs in 6 weeks.   5. DOE: no complaints of SOB at rest. LHC with normal coronaries. He appears euvolemic on exam today. Suspect this is related to deconditioning and obesity.  - Encouraged continued exercise with goal to reach 30 min/day.     Current medicines are reviewed at length with the patient today.  The patient does not have concerns regarding medicines.  The following changes have been made:  Will decrease  atorvastatin to 40mg  daily  Labs/ tests ordered today include:   Orders Placed This Encounter  Procedures  . Lipid panel  . Hepatic function panel     Disposition:   FU with Dr. Sallyanne Kuster in 6 months  Signed, Abigail Butts, PA-C  12/11/2018 12:02 PM

## 2018-12-11 ENCOUNTER — Encounter: Payer: Self-pay | Admitting: Medical

## 2018-12-11 ENCOUNTER — Ambulatory Visit (INDEPENDENT_AMBULATORY_CARE_PROVIDER_SITE_OTHER): Payer: Medicare Other | Admitting: Medical

## 2018-12-11 ENCOUNTER — Other Ambulatory Visit: Payer: Self-pay

## 2018-12-11 VITALS — BP 133/90 | HR 96 | Ht 72.0 in | Wt 249.6 lb

## 2018-12-11 DIAGNOSIS — R0789 Other chest pain: Secondary | ICD-10-CM

## 2018-12-11 DIAGNOSIS — I10 Essential (primary) hypertension: Secondary | ICD-10-CM | POA: Diagnosis not present

## 2018-12-11 DIAGNOSIS — R42 Dizziness and giddiness: Secondary | ICD-10-CM

## 2018-12-11 DIAGNOSIS — Z79899 Other long term (current) drug therapy: Secondary | ICD-10-CM

## 2018-12-11 DIAGNOSIS — E782 Mixed hyperlipidemia: Secondary | ICD-10-CM

## 2018-12-11 DIAGNOSIS — R079 Chest pain, unspecified: Secondary | ICD-10-CM

## 2018-12-11 MED ORDER — ATORVASTATIN CALCIUM 40 MG PO TABS: 40.0000 mg | ORAL_TABLET | Freq: Every day | ORAL | 3 refills | Status: DC

## 2018-12-11 NOTE — Progress Notes (Signed)
[name] [name]

## 2018-12-11 NOTE — Patient Instructions (Signed)
Medication Instructions:  DECREASE ATORVASTATIN 40MG  DAILY If you need a refill on your cardiac medications before your next appointment, please call your pharmacy.  Labwork: FASTING LIPID AND LFT IN 6 WEEKS (01/22/2019) HERE IN OUR OFFICE AT [facility]   You will need to fast. DO NOT EAT OR DRINK PAST MIDNIGHT.      Take the provided lab slips with you to the lab for your blood draw.   When you have your labs (blood work) drawn today and your tests are completely normal, you will receive your results only by MyChart Message (if you have MyChart) -OR-  A paper copy in the mail.  If you have any lab test that is abnormal or we need to change your treatment, we will call you to review these results.   Follow-Up: You will need a follow up appointment in 12 months.  Please call our office 2 months in advance to schedule this appointment.  You may see Sanda Klein, MD [name] PA-C or one of the following Advanced Practice Providers on your designated Care Team: [place], [place] . Fabian Sharp, PA-C     At [facility], you and your health needs are our priority.  As part of our continuing mission to provide you with exceptional heart care, we have created designated Provider Care Teams.  These Care Teams include your primary Cardiologist (physician) and Advanced Practice Providers (APPs -  Physician Assistants and Nurse Practitioners) who all work together to provide you with the care you need, when you need it.  Thank you for choosing [facility] at [facility]!!

## 2018-12-14 ENCOUNTER — Ambulatory Visit
Admission: RE | Admit: 2018-12-14 | Discharge: 2018-12-14 | Disposition: A | Discharge status: Home / Self Care | Payer: Medicare Other | Source: Ambulatory Visit | Attending: Neurology | Admitting: Neurology

## 2018-12-14 ENCOUNTER — Other Ambulatory Visit: Payer: Self-pay

## 2018-12-14 DIAGNOSIS — R202 Paresthesia of skin: Secondary | ICD-10-CM

## 2019-01-06 ENCOUNTER — Telehealth: Payer: Self-pay | Admitting: Neurology

## 2019-01-06 DIAGNOSIS — M352 Behcet's disease: Secondary | ICD-10-CM

## 2019-01-06 NOTE — Telephone Encounter (Signed)
I called the patient.  He is unable to complete MRI even with alprazolam.  The fact that he has to wear a mask significantly worsens his anxiety, we will need to do the MRI under general anesthesia.  I will reorder for this to be done at [facility]

## 2019-01-06 NOTE — Telephone Encounter (Signed)
Pt left voicemail , stating he would like a call back concerning his MRI results  CB# [phone]

## 2019-01-06 NOTE — Telephone Encounter (Signed)
I reached out to the pt. He reports he has attempted to complete his MRI twice but he is unable to due so because he has to wear a mask. Pt states having to wear a mask increases his anxiety levels and and he cannot relax enough to complete (even after taking medications to help).  I mentioned to the pt we may could do a sedated MRI and pt wanted to know MD's thoughts on pursing this?

## 2019-01-07 ENCOUNTER — Telehealth: Payer: Self-pay | Admitting: Neurology

## 2019-01-07 ENCOUNTER — Other Ambulatory Visit: Payer: Medicare Other

## 2019-01-07 NOTE — Telephone Encounter (Signed)
Medicare/[employer] auth: [employer] via [facility] website patient is scheduled at [facility] for 02/11/19.

## 2019-01-10 ENCOUNTER — Emergency Department (HOSPITAL_COMMUNITY): Payer: Medicare Other

## 2019-01-10 ENCOUNTER — Encounter (HOSPITAL_COMMUNITY): Payer: Self-pay

## 2019-01-10 ENCOUNTER — Emergency Department (HOSPITAL_COMMUNITY)
Admission: EM | Admit: 2019-01-10 | Discharge: 2019-01-10 | Disposition: A | Discharge status: Home / Self Care | Payer: Medicare Other | Attending: Emergency Medicine | Admitting: Emergency Medicine

## 2019-01-10 ENCOUNTER — Other Ambulatory Visit: Payer: Self-pay

## 2019-01-10 DIAGNOSIS — R319 Hematuria, unspecified: Secondary | ICD-10-CM | POA: Diagnosis not present

## 2019-01-10 DIAGNOSIS — I10 Essential (primary) hypertension: Secondary | ICD-10-CM | POA: Diagnosis not present

## 2019-01-10 DIAGNOSIS — N390 Urinary tract infection, site not specified: Secondary | ICD-10-CM | POA: Insufficient documentation

## 2019-01-10 DIAGNOSIS — Z79899 Other long term (current) drug therapy: Secondary | ICD-10-CM | POA: Insufficient documentation

## 2019-01-10 DIAGNOSIS — R109 Unspecified abdominal pain: Secondary | ICD-10-CM

## 2019-01-10 LAB — BASIC METABOLIC PANEL
Anion gap: 10 (ref 5–15)
BUN: 17 mg/dL (ref 6–20)
CO2: 25 mmol/L (ref 22–32)
Calcium: 9.4 mg/dL (ref 8.9–10.3)
Chloride: 105 mmol/L (ref 98–111)
Creatinine, Ser: 1.21 mg/dL (ref 0.61–1.24)
GFR calc Af Amer: >60 mL/min (ref >60)
GFR calc non Af Amer: >60 mL/min (ref >60)
Glucose, Bld: 99 mg/dL (ref 70–99)
Potassium: 3.7 mmol/L (ref 3.5–5.1)
Sodium: 140 mmol/L (ref 135–145)

## 2019-01-10 LAB — URINALYSIS, ROUTINE W REFLEX MICROSCOPIC
Bilirubin Urine: NEGATIVE
Glucose, UA: NEGATIVE mg/dL
Ketones, ur: NEGATIVE mg/dL
Nitrite: NEGATIVE
Protein, ur: NEGATIVE mg/dL
RBC / HPF: >50 RBC/hpf — ABNORMAL HIGH (ref 0–5)
Specific Gravity, Urine: 1.01 (ref 1.005–1.030)
pH: 7 (ref 5.0–8.0)

## 2019-01-10 LAB — CBC WITH DIFFERENTIAL/PLATELET
Abs Immature Granulocytes: 0.04 10*3/uL (ref 0.00–0.07)
Basophils Absolute: 0 10*3/uL (ref 0.0–0.1)
Basophils Relative: 0 %
Eosinophils Absolute: 0.2 10*3/uL (ref 0.0–0.5)
Eosinophils Relative: 3 %
HCT: 45.5 % (ref 39.0–52.0)
Hemoglobin: 14.8 g/dL (ref 13.0–17.0)
Immature Granulocytes: 1 %
Lymphocytes Relative: 18 %
Lymphs Abs: 1.3 10*3/uL (ref 0.7–4.0)
MCH: 30.3 pg (ref 26.0–34.0)
MCHC: 32.5 g/dL (ref 30.0–36.0)
MCV: 93 fL (ref 80.0–100.0)
Monocytes Absolute: 0.7 10*3/uL (ref 0.1–1.0)
Monocytes Relative: 10 %
Neutro Abs: 5.1 10*3/uL (ref 1.7–7.7)
Neutrophils Relative %: 68 %
Platelets: 194 10*3/uL (ref 150–400)
RBC: 4.89 MIL/uL (ref 4.22–5.81)
RDW: 12.3 % (ref 11.5–15.5)
WBC: 7.4 10*3/uL (ref 4.0–10.5)
nRBC: 0 % (ref 0.0–0.2)

## 2019-01-10 MED ORDER — KETOROLAC TROMETHAMINE 30 MG/ML IJ SOLN
30.0000 mg | Freq: Once | INTRAMUSCULAR | Status: AC
  Administered 2019-01-10: 17:00:00 30 mg via INTRAVENOUS
  Filled 2019-01-10: qty 1

## 2019-01-10 MED ORDER — CEPHALEXIN 500 MG PO CAPS: 500.0000 mg | ORAL_CAPSULE | Freq: Three times a day (TID) | ORAL | 0 refills | Status: DC

## 2019-01-10 MED ORDER — HYDROCODONE-ACETAMINOPHEN 5-325 MG PO TABS: 1.0000 | ORAL_TABLET | Freq: Four times a day (QID) | ORAL | 0 refills | Status: DC | PRN

## 2019-01-10 NOTE — Discharge Instructions (Addendum)
Begin taking Keflex as prescribed.  Hydrocodone as prescribed as needed for pain.  Follow-up with your primary doctor if your symptoms or not improving in the next few days, and return to the ER if symptoms significantly worsen or change.

## 2019-01-10 NOTE — ED Triage Notes (Signed)
Patient reports that he has been having mid lower back pain that radiates into the left leg x 4 days. Patient states the pain is worse when he sits or lays.  Patient also reported that he had blood in the toilet when he urinated today.

## 2019-01-10 NOTE — ED Provider Notes (Signed)
[facility] DEPT Provider Note   CSN: [id number] Arrival date & time: 01/10/19  1608     History   Chief Complaint Chief Complaint  Patient presents with  . Back Pain  . Hematuria    HPI [name] [name] is a 38 y.o. male.     Patient is a 38 year old male with past medical history of Behcet's disease, bipolar, hypertension.  He presents today with complaints of pain in his left flank and low back.  This is been ongoing for the past several days.  It became worse this morning and patient describes having an episode where he urinated blood.  He denies any fevers or chills.  He denies a history of kidney stones, but has had urinary tract infections in the past.  He denies any dysuria.  He denies any weakness, numbness, or incontinence.  The history is provided by the patient.  Back Pain Location:  Lumbar spine Quality:  Stabbing Radiates to:  L posterior upper leg Pain severity:  Moderate Onset quality:  Sudden Timing:  Constant Progression:  Worsening Chronicity:  New Relieved by:  Nothing Worsened by:  Palpation Ineffective treatments:  None tried Associated symptoms: no bladder incontinence, no bowel incontinence, no fever, no tingling and no weakness     Past Medical History:  Diagnosis Date  . Behcet's disease ([place])   . Bipolar disorder ([place])   . Cardiac arrest ([place]) 12/25/2017   one round of CPR   . Hypertension   . Migraine   . Seizures ([place])    last experienced a seizure x 3 years ago  . Vitamin D deficiency     Patient Active Problem List   Diagnosis Date Noted  . Chest pain with moderate risk for cardiac etiology 11/29/2018  . Cardiac arrest ([place]) 12/25/2017  . Intubation of airway performed without difficulty   . Lactic acidosis   . Acute respiratory failure with hypoxia ([place])   . Seizures ([place])   . Post-ictal coma ([place]) 08/03/2016  . Acute respiratory failure with hypoxemia ([place])   . Seizure ([place])   . Altered mental  status 07/07/2016  . Peripheral vertigo 07/17/2015  . Vertigo 07/17/2015  . Intractable vomiting 07/17/2015  . Diplopia   . Cellulitis and abscess of trunk-right chest wall 05/08/2013  . Dehydration 02/15/2013  . Behcet's disease ([place]) 02/15/2013  . Recurrent vomiting 02/15/2013  . Encounter for therapeutic drug monitoring 06/14/2012  . History of oral aphthous ulcers 10/19/2010  . BIPOLAR DISORDER UNSPECIFIED 06/18/2008  . Essential hypertension 06/18/2008  . SYNCOPE AND COLLAPSE 06/18/2008  . Epilepsy ([place]) 06/18/2008    Past Surgical History:  Procedure Laterality Date  . APPENDECTOMY    . FRACTURE SURGERY Right    Arm  . LEFT HEART CATH AND CORONARY ANGIOGRAPHY N/A 11/29/2018   Procedure: LEFT HEART CATH AND CORONARY ANGIOGRAPHY;  Surgeon: Martinique, Peter M, MD;  Location: [facility];  Service: Cardiovascular;  Laterality: N/A;  . maxilofacial    . NISSEN FUNDOPLICATION          Home Medications    Prior to Admission medications   Medication Sig Start Date End Date Taking? Authorizing Provider  ALPRAZolam Duanne Moron) 0.5 MG tablet Take 2 tablets approximately 45 minutes prior to the MRI study, take a third tablet if needed. 11/12/18   Kathrynn Ducking, MD  Apremilast (OTEZLA) 30 MG TABS Take 30 mg by mouth 2 (two) times daily.     [provider]  atorvastatin (  LIPITOR) 40 MG tablet Take 1 tablet (40 mg total) by mouth daily. 12/11/18 03/11/19  Kroeger, Lorelee Cover., PA-C  clonazePAM (KLONOPIN) 0.5 MG tablet Take 0.25 mg by mouth 3 (three) times daily.     [provider]  FLUoxetine (PROZAC) 20 MG/5ML solution Take 80 mg by mouth daily.  11/27/16   [provider]  lamoTRIgine (LAMICTAL ODT) 200 MG TBDP Take 200 mg by mouth 2 (two) times daily.    [provider]  levETIRAcetam (KEPPRA) 100 MG/ML solution TAKE 7 ML(700 MG) BY MOUTH TWICE DAILY Patient taking differently: Take 700 mg by mouth 2 (two) times daily.  06/03/18   Kathrynn Ducking,  MD  metoprolol tartrate (LOPRESSOR) 25 MG tablet Take 0.5 tablets (12.5 mg total) by mouth 2 (two) times daily. 11/26/18 12/26/18  Kroeger, Lorelee Cover., PA-C  mycophenolate (CELLCEPT) 500 MG tablet Take 1,000 mg by mouth 2 (two) times daily.  03/20/17   [provider]  QUEtiapine (SEROQUEL) 200 MG tablet Take 200 mg by mouth at bedtime.    [provider]  Vitamin D, Ergocalciferol, (DRISDOL) 1.25 MG (50000 UT) CAPS capsule Take 50,000 Units by mouth every Thursday.  11/12/18   [provider]    Family History Family History  Problem Relation Age of Onset  . Lung cancer Paternal Grandfather   . Other Father        BPPV    Social History Social History   Tobacco Use  . Smoking status: Never Smoker  . Smokeless tobacco: Never Used  Substance Use Topics  . Alcohol use: No  . Drug use: No     Allergies   Patient has no known allergies.   Review of Systems Review of Systems  Constitutional: Negative for fever.  Gastrointestinal: Negative for bowel incontinence.  Genitourinary: Negative for bladder incontinence.  Musculoskeletal: Positive for back pain.  Neurological: Negative for tingling and weakness.  All other systems reviewed and are negative.    Physical Exam Updated Vital Signs BP 140/85 (BP Location: Left Arm)   Pulse (!) 107   Temp 98.1 F (36.7 C) (Oral)   Resp 16   Ht 6' (1.829 m)   Wt 108.9 kg   SpO2 99%   BMI 32.55 kg/m   Physical Exam Vitals signs and nursing note reviewed.  Constitutional:      General: He is not in acute distress.    Appearance: He is well-developed. He is not diaphoretic.  HENT:     Head: Normocephalic and atraumatic.  Neck:     Musculoskeletal: Normal range of motion and neck supple.  Cardiovascular:     Rate and Rhythm: Normal rate and regular rhythm.     Heart sounds: No murmur. No friction rub.  Pulmonary:     Effort: Pulmonary effort is normal. No respiratory distress.     Breath sounds:  Normal breath sounds. No wheezing or rales.  Abdominal:     General: Bowel sounds are normal. There is no distension.     Palpations: Abdomen is soft.     Tenderness: There is no abdominal tenderness.  Musculoskeletal: Normal range of motion.        General: Tenderness present.     Comments: There is tenderness to palpation in the soft tissues of the left lumbar region.  Skin:    General: Skin is warm and dry.  Neurological:     Mental Status: He is alert and oriented to person, place, and time.  Coordination: Coordination normal.     Comments: DTRs are 1+ and symmetrical in the patellar and Achilles tendons bilaterally.  Strength is 5 out of 5 in both lower extremities and he is able to ambulate on his heels and toes without difficulty.      ED Treatments / Results  Labs (all labs ordered are listed, but only abnormal results are displayed) Labs Reviewed  URINALYSIS, ROUTINE W REFLEX MICROSCOPIC  BASIC METABOLIC PANEL  CBC WITH DIFFERENTIAL/PLATELET    EKG None  Radiology No results found.  Procedures Procedures (including critical care time)  Medications Ordered in ED Medications  ketorolac (TORADOL) 30 MG/ML injection 30 mg (has no administration in time range)     Initial Impression / Assessment and Plan / ED Course  I have reviewed the triage vital signs and the nursing notes.  Pertinent labs & imaging results that were available during my care of the patient were reviewed by me and considered in my medical decision making (see chart for details).  Patient with radicular left flank pain for the past several days.  He noticed blood in his urine earlier today.  Presentation concerning for a renal calculus, however none identified on CT scan.  UA suggestive of UTI.  Will treat with keflex, pain meds, and follow up with PCP as needed if not improving.  Final Clinical Impressions(s) / ED Diagnoses   Final diagnoses:  None    ED Discharge Orders    None        Veryl Speak, MD 01/10/19 1843

## 2019-02-06 ENCOUNTER — Other Ambulatory Visit: Payer: Self-pay | Admitting: Neurology

## 2019-02-08 ENCOUNTER — Ambulatory Visit (HOSPITAL_COMMUNITY)
Admission: RE | Admit: 2019-02-08 | Discharge: 2019-02-08 | Disposition: A | Discharge status: Home / Self Care | Payer: Medicare Other | Source: Ambulatory Visit | Attending: Neurology | Admitting: Neurology

## 2019-02-08 DIAGNOSIS — Z01812 Encounter for preprocedural laboratory examination: Secondary | ICD-10-CM | POA: Insufficient documentation

## 2019-02-08 DIAGNOSIS — Z20828 Contact with and (suspected) exposure to other viral communicable diseases: Secondary | ICD-10-CM | POA: Insufficient documentation

## 2019-02-08 LAB — SARS CORONAVIRUS 2 (TAT 6-24 HRS): SARS Coronavirus 2: NEGATIVE

## 2019-02-10 ENCOUNTER — Encounter (HOSPITAL_COMMUNITY): Payer: Self-pay | Admitting: *Deleted

## 2019-02-10 ENCOUNTER — Other Ambulatory Visit: Payer: Self-pay

## 2019-02-10 NOTE — Progress Notes (Signed)
[name] [name] denies chest pain or shortness of breath. [name] [name] states that MRI was ordered in June by Dr Jannifer Franklin and patient tried x 2 to have MRI without sediation ,but could not tolerate it.    [name] [name] does not have a H/P wityhin the last 30 days, I called Dr Jannifer Franklin' office and informed.  I spoke with Sheria Lang, who tried to get the patient a tele visit for Tues am, it was unsuccessful. MRI will be cancelled and reschedule after patient is seen in the office.

## 2019-02-11 ENCOUNTER — Telehealth: Payer: Self-pay | Admitting: Neurology

## 2019-02-11 ENCOUNTER — Ambulatory Visit (HOSPITAL_COMMUNITY): Admission: RE | Admit: 2019-02-11 | Payer: Medicare Other | Source: Ambulatory Visit

## 2019-02-11 ENCOUNTER — Telehealth: Payer: Self-pay | Admitting: Family Medicine

## 2019-02-11 ENCOUNTER — Other Ambulatory Visit (HOSPITAL_COMMUNITY): Payer: Medicare Other

## 2019-02-11 ENCOUNTER — Ambulatory Visit (HOSPITAL_COMMUNITY)
Admission: RE | Admit: 2019-02-11 | Discharge: 2019-02-11 | Disposition: A | Discharge status: Home / Self Care | Payer: Medicare Other | Attending: Neurology | Admitting: Neurology

## 2019-02-11 ENCOUNTER — Telehealth: Payer: Self-pay

## 2019-02-11 HISTORY — DX: Depression, unspecified: F32.A

## 2019-02-11 SURGERY — MRI WITH ANESTHESIA
Anesthesia: General

## 2019-02-11 NOTE — Telephone Encounter (Signed)
Pam,RN Orthoptist)  from [facility] called in and is requesting a call back from Dr Jannifer Franklin to discuss getting pt in for his History and Physical so that he can proceed with his MRI  CB# [phone] ( direct number)

## 2019-02-11 NOTE — Telephone Encounter (Signed)
The patient apparently requires MRI of the brain under general anesthesia, he will need a history and physical prior to the MRI study done.  We will try to get him worked in for a quick evaluation.  They need an evaluation within 30 days of the study.

## 2019-02-11 NOTE — Progress Notes (Signed)
Received call from admitting office.  Pt was checked in for procedure this am, even though procedure was cancelled yesterday. Pt checked back in with admitting office at 11 am after not being called back to preop. Pt stated he was told he needed to do a virtual visit before his procedure today, but he was not told the procedure was cancelled.  Pt states he did his virtual visit. Informed patient procedure was cancelled and to contact order MDs office for reschedule.

## 2019-02-11 NOTE — Telephone Encounter (Signed)
Patient was a no show for their virtual visit today.  

## 2019-02-11 NOTE — Telephone Encounter (Signed)
Pt was scheduled to see Amy Lomax this am for a virtual appt but did not show for appt.  Will hold for a open slot.

## 2019-02-11 NOTE — Telephone Encounter (Signed)
Pt has been scheduled for 02/12/2019 at 12 pm check in time of 11:30 am.

## 2019-02-12 ENCOUNTER — Ambulatory Visit (INDEPENDENT_AMBULATORY_CARE_PROVIDER_SITE_OTHER): Payer: Medicare Other | Admitting: Neurology

## 2019-02-12 ENCOUNTER — Encounter: Payer: Self-pay | Admitting: Neurology

## 2019-02-12 ENCOUNTER — Encounter (HOSPITAL_COMMUNITY): Payer: Self-pay | Admitting: *Deleted

## 2019-02-12 ENCOUNTER — Other Ambulatory Visit: Payer: Self-pay

## 2019-02-12 VITALS — BP 136/96 | HR 99 | Temp 98.8°F | Ht 72.0 in | Wt 239.0 lb

## 2019-02-12 DIAGNOSIS — R202 Paresthesia of skin: Secondary | ICD-10-CM

## 2019-02-12 NOTE — Anesthesia Preprocedure Evaluation (Addendum)
Anesthesia Evaluation  Patient identified by MRN, date of birth, ID band Patient awake    Reviewed: Allergy & Precautions, H&P , NPO status , Patient's Chart, lab work & pertinent test results, reviewed documented beta blocker date and time   Airway Mallampati: III  TM Distance: >3 FB Neck ROM: Full    Dental no notable dental hx. (+) Teeth Intact, Dental Advisory Given   Pulmonary sleep apnea and Continuous Positive Airway Pressure Ventilation ,    Pulmonary exam normal breath sounds clear to auscultation       Cardiovascular Exercise Tolerance: Good hypertension, Pt. on medications and Pt. on home beta blockers  Rhythm:Regular Rate:Normal     Neuro/Psych  Headaches, Seizures -, Well Controlled,  Depression Bipolar Disorder    GI/Hepatic negative GI ROS, Neg liver ROS,   Endo/Other  negative endocrine ROS  Renal/GU negative Renal ROS  negative genitourinary   Musculoskeletal   Abdominal   Peds  Hematology negative hematology ROS (+)   Anesthesia Other Findings   Reproductive/Obstetrics negative OB ROS                           Anesthesia Physical Anesthesia Plan  ASA: III  Anesthesia Plan: General   Post-op Pain Management:    Induction: Intravenous  PONV Risk Score and Plan: 3 and Ondansetron, Dexamethasone and Midazolam  Airway Management Planned: Oral ETT  Additional Equipment:   Intra-op Plan:   Post-operative Plan: Extubation in OR  Informed Consent: I have reviewed the patients History and Physical, chart, labs and discussed the procedure including the risks, benefits and alternatives for the proposed anesthesia with the patient or authorized representative who has indicated his/her understanding and acceptance.     Dental advisory given  Plan Discussed with: CRNA  Anesthesia Plan Comments: (PAT note written 02/12/2019 by Myra Gianotti, PA-C. )        Anesthesia Quick Evaluation

## 2019-02-12 NOTE — Progress Notes (Signed)
Anesthesia Chart Review: [name] [name]   Case: [id number] Date/Time: 02/13/19 1101   Procedure: MRI WITH ANESTHESIA OF BRAIN WITH AND WITHOUT CONTRAST (N/A )   Anesthesia type: General   Pre-op diagnosis: BEHCET'S DISEASE   Location: [facility] OR RADIOLOGY ROOM / [place] OR   Surgeon: Radiologist, Medication, MD      DISCUSSION: Patient is a 38 year old male scheduled for the above procedure. H&P done 02/12/19 by neurologist Kathrynn Ducking, MD.  History includes never smoker, Behcet's disease, seizures, HTN, OSA (CPAP), Nissen fundoplication, suspected cardiac arrest 12/25/17 (collapse after swimming, 1 round CPR by lifeguard, CBG 48, intubated in ED, no aortic dissection of PE by imaging, cardiac work-up negative, history of seizures), xiphoid pain (s/p xiphoid resection 09/25/18).  Had [facility] coronaries by [facility] 11/29/18.   02/08/19 COVID-19 test negative. Anesthesia team to evaluate on arrival   VS:  BP Readings from Last 3 Encounters:  02/12/19 (!) 136/96  01/10/19 (!) 137/99  12/11/18 133/90   Pulse Readings from Last 3 Encounters:  02/12/19 99  01/10/19 91  12/11/18 96    PROVIDERS: Leighton Ruff, MD is PCP  Sanda Klein, MD is cardiologist. Last visit 11/29/18.  Excell Seltzer, MD is CT surgeon ([place]) Ang, Simona Huh, MD is rheumatologist ([place]) Eduard Roux MD is GI ([place])   LABS: Labs as of 10/9/290 showed: Lab Results  Component Value Date   WBC 7.4 01/10/2019   HGB 14.8 01/10/2019   HCT 45.5 01/10/2019   PLT 194 01/10/2019   GLUCOSE 99 01/10/2019   ALT 22 05/28/2018   AST 13 05/28/2018   NA 140 01/10/2019   K 3.7 01/10/2019   CL 105 01/10/2019   CREATININE 1.21 01/10/2019   BUN 17 01/10/2019   CO2 25 01/10/2019     IMAGES: CXR 11/07/18: FINDINGS: - Cardiomediastinal silhouette is normal. Mediastinal contours appear intact. - There is no evidence of focal airspace consolidation, pleural effusion or  pneumothorax. - Osseous structures are without acute abnormality. Soft tissues are grossly normal. IMPRESSION: No active cardiopulmonary disease.   EKG: 11/25/18: ST at 105 bpm.  Cannot rule out inferior infarct, age undetermined.  T wave abnormality, consider lateral ischemia.    CV: Cardiac cath 11/29/18:  The left ventricular systolic function is normal.  LV end diastolic pressure is normal.  The left ventricular ejection fraction is 55-65% by visual estimate. 1. Normal coronary anatomy 2. Normal LV function 3. Normal LVEDP Plan: medical Rx.    Nuclear stress test 02/22/18:  Nuclear stress EF: 64%.  The left ventricular ejection fraction is normal (55-65%).  Stress EKG showed no change from baseline EKG showing downsloping ST segment depression in the inferolateral leads  The study is normal.  This is a low risk study.   14 day Cardiac Event Monitor 01/22/18:  No rhythm abnormalities are seen during the recording.  The rhythm is normal sinus with periods of sinus tachycardia during daytime hours, normal circadian variation.  A few patient triggered recordings are submitted, all of them associated with mild sinus tachycardia. Normal 14-day event monitor.  Symptoms are associated with mild sinus tachycardia.   Echo 12/26/17: Study Conclusions - Left ventricle: The cavity size was normal. Wall thickness was   normal. Systolic function was vigorous. The estimated ejection   fraction was in the range of 65% to 70%. Wall motion was normal;   there were no regional wall motion abnormalities. Doppler   parameters are consistent with abnormal left  ventricular   relaxation (grade 1 diastolic dysfunction). The E/e&' ratio is   between 8-15, suggesting indeterminate LV filling pressure. - Mitral valve: Mildly thickened leaflets . There was trivial   regurgitation. - Left atrium: The atrium was normal in size. - Inferior vena cava: The vessel was normal in size. The    respirophasic diameter changes were in the normal range (>= 50%),   consistent with normal central venous pressure. Impressions: - LVEF 65-70%, normal wall thickness, normal regional wall motion,   grade 1 DD, indeterminate LV filling pressure, trivial MR, normal   LA size, normal IVC.   CT coronary 10/16/17: IMPRESSION: 1.  Mixed plaque with mild stenosis in the proximal LAD. 2. Small area of calcification proximal LAD but will not register on software for calcium scoring.   Past Medical History:  Diagnosis Date  . Behcet's disease ([place])   . Bipolar disorder ([place])   . Cardiac arrest ([place]) 12/25/2017   one round of CPR   . Depression   . Hypertension   . Migraine   . Seizures ([place])    last one 2017  . Sleep apnea    CPAP at night  . Vitamin D deficiency     Past Surgical History:  Procedure Laterality Date  . APPENDECTOMY    . FRACTURE SURGERY Right    Arm  . LEFT HEART CATH AND CORONARY ANGIOGRAPHY N/A 11/29/2018   Procedure: LEFT HEART CATH AND CORONARY ANGIOGRAPHY;  Surgeon: Martinique, Peter M, MD;  Location: [place] [facility];  Service: Cardiovascular;  Laterality: N/A;  . maxilofacial    . NISSEN FUNDOPLICATION      MEDICATIONS: No current facility-administered medications for this encounter.    Marland Kitchen Apremilast (OTEZLA) 30 MG TABS  . atorvastatin (LIPITOR) 40 MG tablet  . Cholecalciferol (VITAMIN D3) 125 MCG (5000 UT) TABS  . clonazePAM (KLONOPIN) 0.5 MG tablet  . FLUoxetine (PROZAC) 20 MG/5ML solution  . lamoTRIgine (LAMICTAL ODT) 200 MG TBDP  . levETIRAcetam (KEPPRA) 100 MG/ML solution  . metoprolol tartrate (LOPRESSOR) 25 MG tablet  . mycophenolate (CELLCEPT) 500 MG tablet  . QUEtiapine (SEROQUEL) 200 MG tablet  . tiZANidine (ZANAFLEX) 2 MG tablet     Myra Gianotti, PA-C Surgical Short Stay/Anesthesiology [facility] Phone [phone] [facility] Phone [phone] 02/12/2019 3:24 PM

## 2019-02-12 NOTE — Progress Notes (Signed)
Reason for visit: Behcet's disease  [name] [name] is an 38 y.o. male  History of present illness  [name] [name] is a 38 year old right-handed white male with a history of Behcet's disease.  He has a history of seizures, he has not had any seizures since last seen.  He complains of right hand numbness that has been present since his cardiac arrest.  This is mainly in the thumb and index finger of the right hand.  He has difficulty buttoning buttons because of this.  He has been set up for a repeat MRI of the brain, he has not been able to do the study because of significant claustrophobia.  He will have the study done under general anesthesia and comes in today for a physical examination prior to the test.  Past Medical History:  Diagnosis Date  . Behcet's disease ([place])   . Bipolar disorder ([place])   . Cardiac arrest ([place]) 12/25/2017   one round of CPR   . Depression   . Hypertension   . Migraine   . Seizures ([place])    last one 2017  . Vitamin D deficiency     Past Surgical History:  Procedure Laterality Date  . APPENDECTOMY    . FRACTURE SURGERY Right    Arm  . LEFT HEART CATH AND CORONARY ANGIOGRAPHY N/A 11/29/2018   Procedure: LEFT HEART CATH AND CORONARY ANGIOGRAPHY;  Surgeon: Martinique, Peter M, MD;  Location: [place] CV LAB;  Service: Cardiovascular;  Laterality: N/A;  . maxilofacial    . NISSEN FUNDOPLICATION      Family History  Problem Relation Age of Onset  . Lung cancer Paternal Grandfather   . Other Father        BPPV    Social history:  reports that he has never smoked. He has never used smokeless tobacco. He reports that he does not drink alcohol or use drugs.   No Known Allergies  Medications:  Prior to Admission medications   Medication Sig Start Date End Date Taking? Authorizing Provider  Apremilast (OTEZLA) 30 MG TABS Take 30 mg by mouth 2 (two) times daily.    Yes [provider]  atorvastatin (LIPITOR) 40 MG tablet Take 1 tablet (40 mg total) by  mouth daily. Patient taking differently: Take 80 mg by mouth daily.  12/11/18 03/11/19 Yes Kroeger, Lorelee Cover., PA-C  Cholecalciferol (VITAMIN D3) 125 MCG (5000 UT) TABS Take 5,000 Units by mouth daily.   Yes [provider]  clonazePAM (KLONOPIN) 0.5 MG tablet Take 0.25 mg by mouth 3 (three) times daily.    Yes [provider]  FLUoxetine (PROZAC) 20 MG/5ML solution Take 80 mg by mouth daily.  11/27/16  Yes [provider]  lamoTRIgine (LAMICTAL ODT) 200 MG TBDP Take 200 mg by mouth 2 (two) times daily.   Yes [provider]  levETIRAcetam (KEPPRA) 100 MG/ML solution TAKE 7 ML(700 MG) BY MOUTH TWICE DAILY 02/06/19  Yes Lomax, Amy, NP  mycophenolate (CELLCEPT) 500 MG tablet Take 1,000 mg by mouth 2 (two) times daily.  03/20/17  Yes [provider]  QUEtiapine (SEROQUEL) 200 MG tablet Take 200 mg by mouth at bedtime.   Yes [provider]  tiZANidine (ZANAFLEX) 2 MG tablet Take 2 mg by mouth 3 (three) times daily as needed. 01/24/19  Yes [provider]  metoprolol tartrate (LOPRESSOR) 25 MG tablet Take 0.5 tablets (12.5 mg total) by mouth 2 (two) times daily. 11/26/18 02/05/19  Abigail Butts.,  PA-C    ROS:  Out of a complete 14 system review of symptoms, the patient complains only of the following symptoms, and all other reviewed systems are negative.  Numbness Memory problems Skin lesions  Blood pressure (!) 136/96, pulse 99, temperature 98.8 F (37.1 C), temperature source Temporal, height 6' (1.829 m), weight 239 lb (108.4 kg).  Physical Exam  General: The patient is alert and cooperative at the time of the examination.  The patient is moderately obese.  Respiratory: Clear lung fields noted.  Cardiovascular: Regular rate and rhythm, no murmurs or rubs are noted.  Neck: Neck is supple, no carotid bruits are noted.  Skin: No significant peripheral edema is noted.   Neurologic Exam  Mental status: The patient is alert  and oriented x 3 at the time of the examination. The patient has apparent normal recent and remote memory, with an apparently normal attention span and concentration ability.   Cranial nerves: Facial symmetry is present. Speech is normal, no aphasia or dysarthria is noted. Extraocular movements are full. Visual fields are full.  Motor: The patient has good strength in all 4 extremities.  Sensory examination: Soft touch sensation is symmetric on the face, arms, and legs.  Coordination: The patient has good finger-nose-finger and heel-to-shin bilaterally.  Gait and station: The patient has a normal gait. Tandem gait is normal. Romberg is negative. No drift is seen.  Reflexes: Deep tendon reflexes are symmetric.   Assessment/Plan:  1.  Behcet's disease  2.  History of seizures  3.  Right hand numbness  The patient be set up for nerve conduction studies of both arms and EMG on the right arm to exclude significant right carpal tunnel syndrome.  The patient will be undergoing MRI of the brain tomorrow.  He will follow-up for his next revisit in February 2021.  Jill Alexanders MD 02/12/2019 12:10 PM  [facility] [address] [place], [place] [address]  Phone [phone] Fax [phone]

## 2019-02-12 NOTE — Progress Notes (Signed)
H&P 02/12/19 Patient verbalized understanding of instructions covid test on 02/08/19 negative result, patient has been quarantined since his test  anesthesia to review chart since patient had cardiac arrest 12/2017 Patient sees Dr. Derry Lory

## 2019-02-13 ENCOUNTER — Ambulatory Visit (HOSPITAL_COMMUNITY): Payer: Medicare Other | Admitting: Certified Registered"

## 2019-02-13 ENCOUNTER — Encounter (HOSPITAL_COMMUNITY): Payer: Self-pay

## 2019-02-13 ENCOUNTER — Other Ambulatory Visit: Payer: Self-pay

## 2019-02-13 ENCOUNTER — Encounter (HOSPITAL_COMMUNITY): Admission: RE | Disposition: A | Discharge status: Home / Self Care | Payer: Self-pay | Source: Home / Self Care

## 2019-02-13 ENCOUNTER — Ambulatory Visit (HOSPITAL_COMMUNITY)
Admission: RE | Admit: 2019-02-13 | Discharge: 2019-02-13 | Disposition: A | Discharge status: Home / Self Care | Payer: Medicare Other | Attending: Neurology | Admitting: Neurology

## 2019-02-13 ENCOUNTER — Ambulatory Visit (HOSPITAL_COMMUNITY)
Admission: RE | Admit: 2019-02-13 | Discharge: 2019-02-13 | Disposition: A | Discharge status: Home / Self Care | Payer: Medicare Other | Source: Ambulatory Visit | Attending: Neurology | Admitting: Neurology

## 2019-02-13 ENCOUNTER — Telehealth: Payer: Self-pay | Admitting: Neurology

## 2019-02-13 DIAGNOSIS — M352 Behcet's disease: Secondary | ICD-10-CM

## 2019-02-13 DIAGNOSIS — Z79899 Other long term (current) drug therapy: Secondary | ICD-10-CM | POA: Insufficient documentation

## 2019-02-13 DIAGNOSIS — Z8674 Personal history of sudden cardiac arrest: Secondary | ICD-10-CM | POA: Diagnosis not present

## 2019-02-13 DIAGNOSIS — G4733 Obstructive sleep apnea (adult) (pediatric): Secondary | ICD-10-CM | POA: Diagnosis not present

## 2019-02-13 DIAGNOSIS — R569 Unspecified convulsions: Secondary | ICD-10-CM | POA: Diagnosis not present

## 2019-02-13 DIAGNOSIS — F319 Bipolar disorder, unspecified: Secondary | ICD-10-CM | POA: Insufficient documentation

## 2019-02-13 DIAGNOSIS — F40240 Claustrophobia: Secondary | ICD-10-CM | POA: Diagnosis not present

## 2019-02-13 DIAGNOSIS — I10 Essential (primary) hypertension: Secondary | ICD-10-CM | POA: Diagnosis not present

## 2019-02-13 DIAGNOSIS — R200 Anesthesia of skin: Secondary | ICD-10-CM | POA: Diagnosis not present

## 2019-02-13 HISTORY — PX: RADIOLOGY WITH ANESTHESIA: SHX6223

## 2019-02-13 HISTORY — DX: Sleep apnea, unspecified: G47.30

## 2019-02-13 LAB — BASIC METABOLIC PANEL
Anion gap: 12 (ref 5–15)
BUN: 13 mg/dL (ref 6–20)
CO2: 22 mmol/L (ref 22–32)
Calcium: 8.8 mg/dL — ABNORMAL LOW (ref 8.9–10.3)
Chloride: 103 mmol/L (ref 98–111)
Creatinine, Ser: 1.17 mg/dL (ref 0.61–1.24)
GFR calc Af Amer: >60 mL/min (ref >60)
GFR calc non Af Amer: >60 mL/min (ref >60)
Glucose, Bld: 112 mg/dL — ABNORMAL HIGH (ref 70–99)
Potassium: 3.5 mmol/L (ref 3.5–5.1)
Sodium: 137 mmol/L (ref 135–145)

## 2019-02-13 SURGERY — MRI WITH ANESTHESIA
Anesthesia: General

## 2019-02-13 MED ORDER — PHENYLEPHRINE HCL-NACL 10-0.9 MG/250ML-% IV SOLN
INTRAVENOUS | Status: DC | PRN
  Administered 2019-02-13: 40 ug/min via INTRAVENOUS

## 2019-02-13 MED ORDER — METOPROLOL TARTRATE 12.5 MG HALF TABLET
12.5000 mg | ORAL_TABLET | Freq: Once | ORAL | Status: AC
  Administered 2019-02-13: 12.5 mg via ORAL
  Filled 2019-02-13: qty 1

## 2019-02-13 MED ORDER — ONDANSETRON HCL 4 MG/2ML IJ SOLN
INTRAMUSCULAR | Status: DC | PRN
  Administered 2019-02-13: 4 mg via INTRAVENOUS

## 2019-02-13 MED ORDER — GADOBUTROL 1 MMOL/ML IV SOLN
10.0000 mL | Freq: Once | INTRAVENOUS | Status: AC | PRN
  Administered 2019-02-13: 10 mL via INTRAVENOUS

## 2019-02-13 MED ORDER — SUGAMMADEX SODIUM 200 MG/2ML IV SOLN
INTRAVENOUS | Status: DC | PRN
  Administered 2019-02-13: 500 mg via INTRAVENOUS

## 2019-02-13 MED ORDER — METOPROLOL TARTRATE 12.5 MG HALF TABLET
ORAL_TABLET | ORAL | Status: AC
  Filled 2019-02-13: qty 1

## 2019-02-13 MED ORDER — SUCCINYLCHOLINE 20MG/ML (10ML) SYRINGE FOR MEDFUSION PUMP - OPTIME: INTRAMUSCULAR | Status: DC | PRN

## 2019-02-13 MED ORDER — LIDOCAINE 2% (20 MG/ML) 5 ML SYRINGE
INTRAMUSCULAR | Status: DC | PRN
  Administered 2019-02-13: 60 mg via INTRAVENOUS

## 2019-02-13 MED ORDER — DEXAMETHASONE SODIUM PHOSPHATE 10 MG/ML IJ SOLN
INTRAMUSCULAR | Status: DC | PRN
  Administered 2019-02-13: 10 mg via INTRAVENOUS

## 2019-02-13 MED ORDER — ROCURONIUM 10MG/ML (10ML) SYRINGE FOR MEDFUSION PUMP - OPTIME
INTRAVENOUS | Status: DC | PRN
  Administered 2019-02-13: 50 mg via INTRAVENOUS

## 2019-02-13 MED ORDER — LACTATED RINGERS IV SOLN
INTRAVENOUS | Status: DC
  Administered 2019-02-13: 09:00:00 via INTRAVENOUS

## 2019-02-13 MED ORDER — PROPOFOL 10 MG/ML IV BOLUS
INTRAVENOUS | Status: DC | PRN
  Administered 2019-02-13: 150 mg via INTRAVENOUS

## 2019-02-13 MED ORDER — FENTANYL CITRATE (PF) 250 MCG/5ML IJ SOLN
INTRAMUSCULAR | Status: DC | PRN
  Administered 2019-02-13: 50 ug via INTRAVENOUS

## 2019-02-13 NOTE — Anesthesia Postprocedure Evaluation (Signed)
Anesthesia Post Note  Patient: [name] [name]  Procedure(s) Performed: MRI WITH ANESTHESIA OF BRAIN WITH AND WITHOUT CONTRAST (N/A )     Patient location during evaluation: PACU Anesthesia Type: General Level of consciousness: awake and alert Pain management: pain level controlled Vital Signs Assessment: post-procedure vital signs reviewed and stable Respiratory status: spontaneous breathing, nonlabored ventilation and respiratory function stable Cardiovascular status: blood pressure returned to baseline and stable Postop Assessment: no apparent nausea or vomiting Anesthetic complications: no    Last Vitals:  Vitals:   02/13/19 1135 02/13/19 1148  BP: 103/62 107/70  Pulse: 69 72  Resp: (!) 23   Temp: (!) 36.1 C   SpO2: 100%     Last Pain:  Vitals:   02/13/19 1148  TempSrc:   PainSc: 0-No pain                 Duru Reiger,W. EDMOND

## 2019-02-13 NOTE — Anesthesia Procedure Notes (Signed)
Procedure Name: Intubation Date/Time: 02/13/2019 10:28 AM Performed by: Griffin Dakin, CRNA Pre-anesthesia Checklist: Patient identified, Emergency Drugs available, Suction available and Patient being monitored Patient Re-evaluated:Patient Re-evaluated prior to induction Oxygen Delivery Method: Circle system utilized Preoxygenation: Pre-oxygenation with 100% oxygen Induction Type: IV induction Laryngoscope Size: Glidescope and 3 Grade View: Grade I Tube type: Oral Tube size: 7.5 mm Number of attempts: 1 Airway Equipment and Method: Stylet and Video-laryngoscopy Placement Confirmation: ETT inserted through vocal cords under direct vision,  positive ETCO2 and breath sounds checked- equal and bilateral Secured at: 24 cm Tube secured with: Tape Dental Injury: Teeth and Oropharynx as per pre-operative assessment

## 2019-02-13 NOTE — Progress Notes (Signed)
Dr. Ola Spurr made aware that patient's COVID test was 5 days ago.  Patient states he has been quarantined since then with the exception of going to the doctor yesterday to obtain an H&P for the MRI.  Dr. Ola Spurr states the COVID test from 02/08/2019 will be sufficient and no need to retest.

## 2019-02-13 NOTE — Telephone Encounter (Signed)
  I called the patient.  MRI of the brain was normal, he will come back for EMG and nerve conduction study to evaluate the right hand numbness.  MRI brain 02/13/19:  IMPRESSION: Normal MRI of the brain.

## 2019-02-13 NOTE — Transfer of Care (Signed)
Immediate Anesthesia Transfer of Care Note  Patient: [name] [name]  Procedure(s) Performed: MRI WITH ANESTHESIA OF BRAIN WITH AND WITHOUT CONTRAST (N/A )  Patient Location: PACU  Anesthesia Type:General  Level of Consciousness: drowsy  Airway & Oxygen Therapy: Patient Spontanous Breathing and Patient connected to face mask oxygen  Post-op Assessment: Report given to RN and Post -op Vital signs reviewed and stable  Post vital signs: Reviewed and stable  Last Vitals:  Vitals Value Taken Time  BP 103/62 02/13/19 1135  Temp 36.1 C 02/13/19 1135  Pulse 66 02/13/19 1137  Resp 20 02/13/19 1137  SpO2 96 % 02/13/19 1137  Vitals shown include unvalidated device data.  Last Pain:  Vitals:   02/13/19 1135  TempSrc:   PainSc: Asleep         Complications: No apparent anesthesia complications

## 2019-02-14 ENCOUNTER — Encounter (HOSPITAL_COMMUNITY): Payer: Self-pay | Admitting: Radiology

## 2019-03-04 NOTE — H&P (Signed)
Reason for visit: Behcet's disease  [name] [name] is an 38 y.o. male  History of present illness  [name] [name] is a 38 year old right-handed white male with a history of Behcet's disease.  He has a history of seizures, he has not had any seizures since last seen.  He complains of right hand numbness that has been present since his cardiac arrest.  This is mainly in the thumb and index finger of the right hand.  He has difficulty buttoning buttons because of this.  He has been set up for a repeat MRI of the brain, he has not been able to do the study because of significant claustrophobia.  He will have the study done under general anesthesia and comes in today for a physical examination prior to the test.      Past Medical History:  Diagnosis Date  . Behcet's disease ([place])   . Bipolar disorder ([place])   . Cardiac arrest ([place]) 12/25/2017   one round of CPR   . Depression   . Hypertension   . Migraine   . Seizures ([place])    last one 2017  . Vitamin D deficiency          Past Surgical History:  Procedure Laterality Date  . APPENDECTOMY    . FRACTURE SURGERY Right    Arm  . LEFT HEART CATH AND CORONARY ANGIOGRAPHY N/A 11/29/2018   Procedure: LEFT HEART CATH AND CORONARY ANGIOGRAPHY;  Surgeon: Martinique, Peter M, MD;  Location: [place] [facility];  Service: Cardiovascular;  Laterality: N/A;  . maxilofacial    . NISSEN FUNDOPLICATION           Family History  Problem Relation Age of Onset  . Lung cancer Paternal Grandfather   . Other Father        BPPV    Social history:  reports that he has never smoked. He has never used smokeless tobacco. He reports that he does not drink alcohol or use drugs.   No Known Allergies  Medications:         Prior to Admission medications   Medication Sig Start Date End Date Taking? Authorizing Provider  Apremilast (OTEZLA) 30 MG TABS Take 30 mg by mouth 2 (two) times daily.    Yes [provider]  atorvastatin  (LIPITOR) 40 MG tablet Take 1 tablet (40 mg total) by mouth daily. Patient taking differently: Take 80 mg by mouth daily.  12/11/18 03/11/19 Yes Kroeger, Lorelee Cover., PA-C  Cholecalciferol (VITAMIN D3) 125 MCG (5000 UT) TABS Take 5,000 Units by mouth daily.   Yes [provider]  clonazePAM (KLONOPIN) 0.5 MG tablet Take 0.25 mg by mouth 3 (three) times daily.    Yes [provider]  FLUoxetine (PROZAC) 20 MG/5ML solution Take 80 mg by mouth daily.  11/27/16  Yes [provider]  lamoTRIgine (LAMICTAL ODT) 200 MG TBDP Take 200 mg by mouth 2 (two) times daily.   Yes [provider]  levETIRAcetam (KEPPRA) 100 MG/ML solution TAKE 7 ML(700 MG) BY MOUTH TWICE DAILY 02/06/19  Yes Lomax, Amy, NP  mycophenolate (CELLCEPT) 500 MG tablet Take 1,000 mg by mouth 2 (two) times daily.  03/20/17  Yes [provider]  QUEtiapine (SEROQUEL) 200 MG tablet Take 200 mg by mouth at bedtime.   Yes [provider]  tiZANidine (ZANAFLEX) 2 MG tablet Take 2 mg by mouth 3 (three) times daily as needed. 01/24/19  Yes [provider]  metoprolol tartrate (LOPRESSOR) 25 MG  tablet Take 0.5 tablets (12.5 mg total) by mouth 2 (two) times daily. 11/26/18 02/05/19  Kroeger, Lorelee Cover., PA-C    ROS:  Out of a complete 14 system review of symptoms, the patient complains only of the following symptoms, and all other reviewed systems are negative.  Numbness Memory problems Skin lesions  Blood pressure (!) 136/96, pulse 99, temperature 98.8 F (37.1 C), temperature source Temporal, height 6' (1.829 m), weight 239 lb (108.4 kg).  Physical Exam  General: The patient is alert and cooperative at the time of the examination.  The patient is moderately obese.  Respiratory: Clear lung fields noted.  Cardiovascular: Regular rate and rhythm, no murmurs or rubs are noted.  Neck: Neck is supple, no carotid bruits are noted.  Skin: No significant peripheral  edema is noted.   Neurologic Exam  Mental status: The patient is alert and oriented x 3 at the time of the examination. The patient has apparent normal recent and remote memory, with an apparently normal attention span and concentration ability.   Cranial nerves: Facial symmetry is present. Speech is normal, no aphasia or dysarthria is noted. Extraocular movements are full. Visual fields are full.  Motor: The patient has good strength in all 4 extremities.  Sensory examination: Soft touch sensation is symmetric on the face, arms, and legs.  Coordination: The patient has good finger-nose-finger and heel-to-shin bilaterally.  Gait and station: The patient has a normal gait. Tandem gait is normal. Romberg is negative. No drift is seen.  Reflexes: Deep tendon reflexes are symmetric.   Assessment/Plan:  1.  Behcet's disease  2.  History of seizures  3.  Right hand numbness  The patient be set up for nerve conduction studies of both arms and EMG on the right arm to exclude significant right carpal tunnel syndrome.  The patient will be undergoing MRI of the brain tomorrow.  He will follow-up for his next revisit in February 2021.  Jill Alexanders MD 02/12/2019 12:10 PM  [facility] [address] [place], [place] [address]  Phone [phone] Fax [phone]

## 2019-03-30 ENCOUNTER — Other Ambulatory Visit: Payer: Self-pay | Admitting: Medical

## 2019-04-02 ENCOUNTER — Telehealth: Payer: Self-pay

## 2019-04-02 NOTE — Telephone Encounter (Signed)
Canceled pts appt. Provider tested positive for Covid.   Informed of the cancellation.  Please r/s appt.

## 2019-04-09 ENCOUNTER — Encounter: Payer: Medicare Other | Admitting: Neurology

## 2019-04-15 ENCOUNTER — Ambulatory Visit (INDEPENDENT_AMBULATORY_CARE_PROVIDER_SITE_OTHER): Payer: Medicare Other | Admitting: Neurology

## 2019-04-15 ENCOUNTER — Other Ambulatory Visit: Payer: Self-pay

## 2019-04-15 ENCOUNTER — Encounter: Payer: Self-pay | Admitting: Neurology

## 2019-04-15 DIAGNOSIS — R202 Paresthesia of skin: Secondary | ICD-10-CM | POA: Diagnosis not present

## 2019-04-15 DIAGNOSIS — M352 Behcet's disease: Secondary | ICD-10-CM

## 2019-04-15 NOTE — Progress Notes (Signed)
Please refer to EMG and nerve conduction procedure note.  

## 2019-04-15 NOTE — Procedures (Signed)
     HISTORY:  [name] [name] is a 39 year old patient with a history of Behcet's disease.  The patient had a cardiac arrest within the last 8 months and has had some numbness involving the right hand since that time.  He is being evaluated for possible neuropathy or a cervical radiculopathy.  NERVE CONDUCTION STUDIES:  Nerve conduction studies were performed on both upper extremities.  The distal motor latencies motor amplitudes for the median and ulnar nerves were within normal limits bilaterally with normal nerve conduction velocities seen for these nerves.  The sensory latencies for the median and ulnar nerves were normal bilaterally.  The F-wave latencies for the ulnar nerves were within normal limits bilaterally.  EMG STUDIES:  EMG study was performed on the right upper extremity:  The first dorsal interosseous muscle reveals 2 to 4 K units with full recruitment. No fibrillations or positive waves were noted. The abductor pollicis brevis muscle reveals 2 to 4 K units with full recruitment. No fibrillations or positive waves were noted. The extensor indicis proprius muscle reveals 1 to 3 K units with full recruitment. No fibrillations or positive waves were noted. The pronator teres muscle reveals 2 to 3 K units with full recruitment. No fibrillations or positive waves were noted. The biceps muscle reveals 1 to 2 K units with full recruitment. No fibrillations or positive waves were noted. The triceps muscle reveals 2 to 4 K units with full recruitment. No fibrillations or positive waves were noted. The anterior deltoid muscle reveals 2 to 3 K units with full recruitment. No fibrillations or positive waves were noted. The cervical paraspinal muscles were tested at 2 levels. No abnormalities of insertional activity were seen at either level tested. There was good relaxation.    IMPRESSION:  Nerve conduction studies done on both upper extremities were within normal limits.  No evidence of a  carpal tunnel syndrome seen on the right.  EMG evaluation of the right upper extremity was unremarkable without evidence of an overlying cervical radiculopathy.  Jill Alexanders MD 04/15/2019 2:55 PM  [facility] [address] [place], [place] [address]  Phone [phone] Fax [phone]

## 2019-04-15 NOTE — Progress Notes (Signed)
The patient comes in for EMG nerve conduction study evaluation, since his cardiac arrest he has had numbness of the right hand and difficulty with holding things and gripping things.  MRI of the brain was unremarkable.  Nerve conduction studies did not show evidence of carpal tunnel syndrome on the right or any evidence of a cervical radiculopathy by EMG.  The etiology of the numbness is not clear.  He is not having any pain with the symptoms.

## 2019-04-30 ENCOUNTER — Other Ambulatory Visit: Payer: Self-pay | Admitting: *Deleted

## 2019-04-30 MED ORDER — LEVETIRACETAM 100 MG/ML PO SOLN: ORAL | 2 refills | Status: DC

## 2019-05-06 ENCOUNTER — Telehealth: Payer: Self-pay | Admitting: Physician Assistant

## 2019-05-06 ENCOUNTER — Ambulatory Visit (INDEPENDENT_AMBULATORY_CARE_PROVIDER_SITE_OTHER): Payer: Medicare Other | Admitting: Neurology

## 2019-05-06 ENCOUNTER — Encounter: Payer: Self-pay | Admitting: Neurology

## 2019-05-06 ENCOUNTER — Other Ambulatory Visit: Payer: Self-pay

## 2019-05-06 VITALS — BP 118/86 | HR 96 | Temp 97.7°F | Ht 72.0 in | Wt 241.0 lb

## 2019-05-06 DIAGNOSIS — Z5181 Encounter for therapeutic drug level monitoring: Secondary | ICD-10-CM

## 2019-05-06 DIAGNOSIS — G40909 Epilepsy, unspecified, not intractable, without status epilepticus: Secondary | ICD-10-CM

## 2019-05-06 DIAGNOSIS — R202 Paresthesia of skin: Secondary | ICD-10-CM

## 2019-05-06 DIAGNOSIS — M352 Behcet's disease: Secondary | ICD-10-CM

## 2019-05-06 DIAGNOSIS — R200 Anesthesia of skin: Secondary | ICD-10-CM | POA: Diagnosis not present

## 2019-05-06 NOTE — Progress Notes (Signed)
Reason for visit: Behcet's disease, seizures  [name] [name] is an 39 y.o. male  History of present illness:  [name] [name] is a 39 year old right-handed white male with a history of Behcet's disease, he has ongoing active oral and skin ulcers that are painful.  He has some chronic numbness of the right hand following a cardiac arrest, he feels that he is somewhat clumsy with the hand and drops things frequently, he has difficulty using the hand.  EMG and nerve conduction study evaluation did not show evidence of carpal tunnel syndrome or neuropathy or radiculopathy causing the numbness.  MRI of the brain done recently was normal.  The patient has not had any recent seizures, he remains on Lamictal and Keppra.  He is tolerating the medications well.  He is on CellCept for the Behcet's disease.  He returns for an evaluation.  The patient did have a recent fall, he started feeling hot and sweaty while sitting on a couch, he stood up to go to the kitchen and then fell over when he got woozy.  He did not blackout, he did not have a seizure.  He bruised his left arm, he did not hit his head.  Past Medical History:  Diagnosis Date  . Behcet's disease ([place])   . Bipolar disorder ([place])   . Cardiac arrest ([place]) 12/25/2017   one round of CPR   . Depression   . Hypertension   . Migraine   . Seizures ([place])    last one 2017  . Sleep apnea    CPAP at night  . Vitamin D deficiency     Past Surgical History:  Procedure Laterality Date  . APPENDECTOMY    . FRACTURE SURGERY Right    Arm  . LEFT HEART CATH AND CORONARY ANGIOGRAPHY N/A 11/29/2018   Procedure: LEFT HEART CATH AND CORONARY ANGIOGRAPHY;  Surgeon: Martinique, Peter M, MD;  Location: [facility];  Service: Cardiovascular;  Laterality: N/A;  . maxilofacial    . NISSEN FUNDOPLICATION    . RADIOLOGY WITH ANESTHESIA N/A 02/13/2019   Procedure: MRI WITH ANESTHESIA OF BRAIN WITH AND WITHOUT CONTRAST;  Surgeon: Radiologist, Medication, MD;   Location: [facility];  Service: Radiology;  Laterality: N/A;    Family History  Problem Relation Age of Onset  . Lung cancer Paternal Grandfather   . Other Father        BPPV    Social history:  reports that he has never smoked. He has never used smokeless tobacco. He reports that he does not drink alcohol or use drugs.   No Known Allergies  Medications:  Prior to Admission medications   Medication Sig Start Date End Date Taking? Authorizing Provider  Apremilast (OTEZLA) 30 MG TABS Take 30 mg by mouth 2 (two) times daily.    Yes [provider]  Cholecalciferol (VITAMIN D3) 125 MCG (5000 UT) TABS Take 5,000 Units by mouth daily.   Yes [provider]  clonazePAM (KLONOPIN) 0.5 MG tablet Take 0.25 mg by mouth 3 (three) times daily.    Yes [provider]  FLUoxetine (PROZAC) 20 MG/5ML solution Take 80 mg by mouth daily.  11/27/16  Yes [provider]  lamoTRIgine (LAMICTAL ODT) 200 MG TBDP Take 200 mg by mouth 2 (two) times daily.   Yes [provider]  levETIRAcetam (KEPPRA) 100 MG/ML solution TAKE 7 ML(700 MG) BY MOUTH TWICE DAILY 04/30/19  Yes [name] Ducking, MD  metoprolol tartrate (LOPRESSOR) 25 MG  tablet Take 1 tablet (25 mg total) by mouth 2 (two) times daily. Take 1/2 Tablet (12.5mg ) by mouth twice daily 03/31/19  Yes [name] [name]., PA-C  mycophenolate (CELLCEPT) 500 MG tablet Take 1,000 mg by mouth 2 (two) times daily.  03/20/17  Yes [provider]  QUEtiapine (SEROQUEL) 200 MG tablet Take 200 mg by mouth at bedtime.   Yes [provider]  tiZANidine (ZANAFLEX) 2 MG tablet Take 2 mg by mouth 3 (three) times daily as needed. 01/24/19  Yes [provider]  atorvastatin (LIPITOR) 40 MG tablet Take 1 tablet (40 mg total) by mouth daily. Patient taking differently: Take 80 mg by mouth daily.  12/11/18 03/11/19  Kroeger, Lorelee Cover., PA-C    ROS:  Out of a complete 14 system review of symptoms, the patient  complains only of the following symptoms, and all other reviewed systems are negative.  Hand numbness, right Skin lesions   Blood pressure 118/86, pulse 96, temperature 97.7 F (36.5 C), height 6' (1.829 [name]), weight 241 lb (109.3 kg).  Physical Exam  General: The patient is alert and cooperative at the time of the examination.  The patient is moderately obese.  Skin: No significant peripheral edema is noted.   Neurologic Exam  Mental status: The patient is alert and oriented x 3 at the time of the examination. The patient has apparent normal recent and remote memory, with an apparently normal attention span and concentration ability.   Cranial nerves: Facial symmetry is present. Speech is normal, no aphasia or dysarthria is noted. Extraocular movements are full. Visual fields are full.  Motor: The patient has good strength in all 4 extremities.  Sensory examination: Soft touch sensation is symmetric on the face, arms, and legs.  Coordination: The patient has good finger-nose-finger and heel-to-shin bilaterally.  Gait and station: The patient has a normal gait. Tandem gait is normal. Romberg is negative. No drift is seen.  Reflexes: Deep tendon reflexes are symmetric.   MRI brain 02/13/19:  IMPRESSION: Normal MRI of the brain.  * MRI scan images were reviewed online. I agree with the written report.    Assessment/Plan:  1.  Right hand numbness, clumsiness  2.  Behcet's disease  3.  History of seizures  The patient will be sent for occupational therapy due to reports of clumsiness using the right hand.  The patient has done well with his seizures, he will continue Keppra and Lamictal, blood work will be done today.  He claims that he does not operate a motor vehicle as he is afraid to drive.  He will follow-up here in 6 months.  [name] Alexanders MD 05/06/2019 9:03 AM  [facility] [address] [place], [place] [address]  Phone  [phone] Fax [phone]

## 2019-05-06 NOTE — Telephone Encounter (Signed)
STAT if patient feels like he/she is going to faint   1) Are you dizzy now? no  2) Do you feel faint or have you passed out? No  3) Do you have any other symptoms? very sweaty yesterday   4) Have you checked your HR and BP (record if available)? No   Patient has an appointment with [name] [name] tomorrow 2/3 at 8:15. He states yesterday he felt very dizzy and sweating and fell. He says he did not pass out but was concerned it may be his BP. He states he did not have a way to check his HR or BP.

## 2019-05-06 NOTE — Telephone Encounter (Signed)
lmtcb

## 2019-05-07 ENCOUNTER — Ambulatory Visit (INDEPENDENT_AMBULATORY_CARE_PROVIDER_SITE_OTHER): Payer: Medicare Other | Admitting: Physician Assistant

## 2019-05-07 ENCOUNTER — Other Ambulatory Visit: Payer: Self-pay

## 2019-05-07 ENCOUNTER — Encounter: Payer: Self-pay | Admitting: Physician Assistant

## 2019-05-07 VITALS — BP 113/78 | HR 71 | Ht 72.0 in | Wt 242.0 lb

## 2019-05-07 DIAGNOSIS — I10 Essential (primary) hypertension: Secondary | ICD-10-CM

## 2019-05-07 DIAGNOSIS — R55 Syncope and collapse: Secondary | ICD-10-CM

## 2019-05-07 NOTE — Patient Instructions (Signed)
Medication Instructions:  Your physician recommends that you continue on your current medications as directed. Please refer to the Current Medication list given to you today.  *If you need a refill on your cardiac medications before your next appointment, please call your pharmacy*   Follow-Up: At [facility], you and your health needs are our priority.  As part of our continuing mission to provide you with exceptional heart care, we have created designated Provider Care Teams.  These Care Teams include your primary Cardiologist (physician) and Advanced Practice Providers (APPs -  Physician Assistants and Nurse Practitioners) who all work together to provide you with the care you need, when you need it.  Your next appointment:   3 month(s)  The format for your next appointment:   In Person  Provider:   Mihai Croitoru, MD  

## 2019-05-07 NOTE — Progress Notes (Signed)
Cardiology Office Note:    Date:  05/09/2019   ID:  [name] [name], DOB 10/05/1980, MRN [id number]  PCP:  Leighton Ruff, MD  Cardiologist:  Sanda Klein, MD  Electrophysiologist:  None   Referring MD: Leighton Ruff, MD   Chief Complaint  Patient presents with  . Dizziness    seen for Dr. Sallyanne Kuster    History of Present Illness:    [name] [name] is a 39 y.o. male with a hx of questionable cardiac arrest versus postictal state in September 2019 with negative cardiac work-up, hypertension, seizure disorder, Behcet's disease, GERD, bipolar disorder and migraines.  Echocardiogram obtained on 12/26/2017 showed EF 65 to 70%, grade 1 DD, no significant valve issue.  14-day heart monitor obtained in October 2019 showed no significant rhythm abnormality, there were a few triggered event however all of which were associated with mild sinus tachycardia.  Myoview obtained in November 2019 was low risk with EF 64%, normal significant perfusion abnormality.  Due to exertional chest pain, patient underwent cardiac catheterization on 11/21/2018 which showed normal coronary anatomy, normal LV function and a normal LVEDP.  There exertional dyspnea was thought to be related to deconditioning.  During the last office visit in September 2020, he did complain of positional lightheadedness.  Orthostatic vital signs were obtained which were negative.  He was encouraged to use compression stocking and slowly getting up in the morning.  MRI of the brain obtained on 02/13/2019 was normal.  [name] [name] presented today for evaluation of presyncope.  This episode occurred on Sunday.  He was sitting down at the time when he suddenly had an episode of flushing sensation and profuse diaphoresis.  He felt it may have been his blood pressure dropping.  When he tried to get up, he fell onto the floor.  On physical exam, he had some bruising on the medial side of his arm.  Otherwise he denies any chest pain, worsening shortness of  breath, lower extremity edema, or abdominal pain.  Symptom lasted for few minutes before going away and he did not lose consciousness.  I suspect his symptom is a vasovagal episode.  He says he did experience something like this in the past however it has been many months ago.  Given normal cardiac work-up in the past, I did not recommend further cardiac work-up at this time.  He will continue to observe the symptom and keep himself hydrated.  I left him on the beta-blocker therapy for the time being as there is still question what exactly happened during the cardiac arrest in 2019.    Past Medical History:  Diagnosis Date  . Behcet's disease ([place])   . Bipolar disorder ([place])   . Cardiac arrest ([place]) 12/25/2017   one round of CPR   . Depression   . Hypertension   . Migraine   . Seizures ([place])    last one 2017  . Sleep apnea    CPAP at night  . Vitamin D deficiency     Past Surgical History:  Procedure Laterality Date  . APPENDECTOMY    . FRACTURE SURGERY Right    Arm  . LEFT HEART CATH AND CORONARY ANGIOGRAPHY N/A 11/29/2018   Procedure: LEFT HEART CATH AND CORONARY ANGIOGRAPHY;  Surgeon: Martinique, Peter M, MD;  Location: [facility];  Service: Cardiovascular;  Laterality: N/A;  . maxilofacial    . NISSEN FUNDOPLICATION    . RADIOLOGY WITH ANESTHESIA N/A 02/13/2019   Procedure: MRI WITH ANESTHESIA OF  BRAIN WITH AND WITHOUT CONTRAST;  Surgeon: Radiologist, Medication, MD;  Location: [facility];  Service: Radiology;  Laterality: N/A;    Current Medications: Current Meds  Medication Sig  . Apremilast (OTEZLA) 30 MG TABS Take 30 mg by mouth 2 (two) times daily.   . Cholecalciferol (VITAMIN D3) 125 MCG (5000 UT) TABS Take 5,000 Units by mouth daily.  . clonazePAM (KLONOPIN) 0.5 MG tablet Take 0.25 mg by mouth 3 (three) times daily.   Marland Kitchen FLUoxetine (PROZAC) 20 MG/5ML solution Take 80 mg by mouth daily.   Marland Kitchen lamoTRIgine (LAMICTAL ODT) 200 MG TBDP Take 200 mg by mouth 2 (two) times daily.   Marland Kitchen levETIRAcetam (KEPPRA) 100 MG/ML solution TAKE 7 ML(700 MG) BY MOUTH TWICE DAILY  . metoprolol tartrate (LOPRESSOR) 25 MG tablet Take 1 tablet (25 mg total) by mouth 2 (two) times daily. Take 1/2 Tablet (12.5mg ) by mouth twice daily  . mycophenolate (CELLCEPT) 500 MG tablet Take 1,000 mg by mouth 2 (two) times daily.   . QUEtiapine (SEROQUEL) 200 MG tablet Take 200 mg by mouth at bedtime.     Allergies:   Patient has no known allergies.   Social History   Socioeconomic History  . Marital status: Single    Spouse name: Not on file  . Number of children: 0  . Years of education: 2  . Highest education level: Not on file  Occupational History    Employer: DISABLED  Tobacco Use  . Smoking status: Never Smoker  . Smokeless tobacco: Never Used  Substance and Sexual Activity  . Alcohol use: No  . Drug use: No  . Sexual activity: Not on file  Other Topics Concern  . Not on file  Social History Narrative  . Not on file   Social Determinants of Health   Financial Resource Strain:   . Difficulty of Paying Living Expenses: Not on file  Food Insecurity:   . Worried About Charity fundraiser in the Last Year: Not on file  . Ran Out of Food in the Last Year: Not on file  Transportation Needs:   . Lack of Transportation (Medical): Not on file  . Lack of Transportation (Non-Medical): Not on file  Physical Activity:   . Days of Exercise per Week: Not on file  . Minutes of Exercise per Session: Not on file  Stress:   . Feeling of Stress : Not on file  Social Connections:   . Frequency of Communication with Friends and Family: Not on file  . Frequency of Social Gatherings with Friends and Family: Not on file  . Attends Religious Services: Not on file  . Active Member of Clubs or Organizations: Not on file  . Attends Archivist Meetings: Not on file  . Marital Status: Not on file     Family History: The patient's family history includes Lung cancer in his paternal  grandfather; Other in his father.  ROS:   Please see the history of present illness.     All other systems reviewed and are negative.  EKGs/Labs/Other Studies Reviewed:    The following studies were reviewed today:  Cath 11/29/2018  The left ventricular systolic function is normal.  LV end diastolic pressure is normal.  The left ventricular ejection fraction is 55-65% by visual estimate.   1. Normal coronary anatomy 2. Normal LV function 3. Normal LVEDP  Plan: medical Rx.   EKG:  EKG is ordered today.  The ekg ordered today demonstrates normal sinus rhythm without  significant ST-T wave changes.  Recent Labs: 05/06/2019: ALT 25; BUN 13; Creatinine, Ser 1.08; Hemoglobin 14.5; Platelets 181; Potassium 3.4; Sodium 143  Recent Lipid Panel    Component Value Date/Time   TRIG 405 (H) 12/27/2017 0518    Physical Exam:    VS:  BP 113/78   Pulse 71   Ht 6' (1.829 m)   Wt 242 lb (109.8 kg)   SpO2 100%   BMI 32.82 kg/m     Wt Readings from Last 3 Encounters:  05/07/19 242 lb (109.8 kg)  05/06/19 241 lb (109.3 kg)  02/13/19 238 lb 15.7 oz (108.4 kg)     GEN:  Well nourished, well developed in no acute distress HEENT: Normal NECK: No JVD; No carotid bruits LYMPHATICS: No lymphadenopathy CARDIAC: RRR, no murmurs, rubs, gallops RESPIRATORY:  Clear to auscultation without rales, wheezing or rhonchi  ABDOMEN: Soft, non-tender, non-distended MUSCULOSKELETAL:  No edema; No deformity  SKIN: Warm and dry NEUROLOGIC:  Alert and oriented x 3 PSYCHIATRIC:  Normal affect   ASSESSMENT:    1. Pre-syncope   2. Essential hypertension    PLAN:    In order of problems listed above:  1. Presyncope: Symptoms consistent with vasovagal syncope.  Patient has had extensive work-up in the past including heart monitor, cardiac catheterization and echocardiogram which were all normal.  He had a questionable cardiac arrest versus post ictal state in 2019.  At this time I do not recommend  further work-up.  Continue to monitor symptoms.  2. HTN: Blood pressure stable on current therapy.   Medication Adjustments/Labs and Tests Ordered: Current medicines are reviewed at length with the patient today.  Concerns regarding medicines are outlined above.  Orders Placed This Encounter  Procedures  . EKG 12-Lead   No orders of the defined types were placed in this encounter.   Patient Instructions  Medication Instructions:  Your physician recommends that you continue on your current medications as directed. Please refer to the Current Medication list given to you today.  *If you need a refill on your cardiac medications before your next appointment, please call your pharmacy*   Follow-Up: At [facility], you and your health needs are our priority.  As part of our continuing mission to provide you with exceptional heart care, we have created designated Provider Care Teams.  These Care Teams include your primary Cardiologist (physician) and Advanced Practice Providers (APPs -  Physician Assistants and Nurse Practitioners) who all work together to provide you with the care you need, when you need it.  Your next appointment:   3 month(s)  The format for your next appointment:   In Person  Provider:   Sanda Klein, MD       Signed, Almyra Deforest, [place]  05/09/2019 11:24 PM    [place]

## 2019-05-08 LAB — COMPREHENSIVE METABOLIC PANEL
ALT: 25 IU/L (ref 0–44)
AST: 19 IU/L (ref 0–40)
Albumin/Globulin Ratio: 2 (ref 1.2–2.2)
Albumin: 4.5 g/dL (ref 4.0–5.0)
Alkaline Phosphatase: 110 IU/L (ref 39–117)
BUN/Creatinine Ratio: 12 (ref 9–20)
BUN: 13 mg/dL (ref 6–24)
Bilirubin Total: 0.5 mg/dL (ref 0.0–1.2)
CO2: 22 mmol/L (ref 20–29)
Calcium: 9.1 mg/dL (ref 8.7–10.2)
Chloride: 103 mmol/L (ref 96–106)
Creatinine, Ser: 1.08 mg/dL (ref 0.76–1.27)
GFR calc Af Amer: 99 mL/min/{1.73_m2} (ref >59)
GFR calc non Af Amer: 85 mL/min/{1.73_m2} (ref >59)
Globulin, Total: 2.2 g/dL (ref 1.5–4.5)
Glucose: 122 mg/dL — ABNORMAL HIGH (ref 65–99)
Potassium: 3.4 mmol/L — ABNORMAL LOW (ref 3.5–5.2)
Sodium: 143 mmol/L (ref 134–144)
Total Protein: 6.7 g/dL (ref 6.0–8.5)

## 2019-05-08 LAB — CBC WITH DIFFERENTIAL/PLATELET
Basophils Absolute: 0 10*3/uL (ref 0.0–0.2)
Basos: 1 %
EOS (ABSOLUTE): 0.2 10*3/uL (ref 0.0–0.4)
Eos: 4 %
Hematocrit: 44.5 % (ref 37.5–51.0)
Hemoglobin: 14.5 g/dL (ref 13.0–17.7)
Immature Grans (Abs): 0 10*3/uL (ref 0.0–0.1)
Immature Granulocytes: 0 %
Lymphocytes Absolute: 1.2 10*3/uL (ref 0.7–3.1)
Lymphs: 21 %
MCH: 30.1 pg (ref 26.6–33.0)
MCHC: 32.6 g/dL (ref 31.5–35.7)
MCV: 93 fL (ref 79–97)
Monocytes Absolute: 0.5 10*3/uL (ref 0.1–0.9)
Monocytes: 9 %
Neutrophils Absolute: 3.7 10*3/uL (ref 1.4–7.0)
Neutrophils: 65 %
Platelets: 181 10*3/uL (ref 150–450)
RBC: 4.81 x10E6/uL (ref 4.14–5.80)
RDW: 12.2 % (ref 11.6–15.4)
WBC: 5.6 10*3/uL (ref 3.4–10.8)

## 2019-05-08 LAB — LEVETIRACETAM LEVEL: Levetiracetam Lvl: 46.6 ug/mL — ABNORMAL HIGH (ref 10.0–40.0)

## 2019-05-08 LAB — LAMOTRIGINE LEVEL: Lamotrigine Lvl: 4.1 ug/mL (ref 2.0–20.0)

## 2019-05-09 ENCOUNTER — Encounter: Payer: Self-pay | Admitting: Physician Assistant

## 2019-05-19 ENCOUNTER — Ambulatory Visit: Payer: Medicare Other | Admitting: Neurology

## 2019-06-06 ENCOUNTER — Other Ambulatory Visit: Payer: Self-pay

## 2019-06-06 ENCOUNTER — Emergency Department (HOSPITAL_COMMUNITY): Payer: Medicare Other

## 2019-06-06 ENCOUNTER — Emergency Department (HOSPITAL_COMMUNITY)
Admission: EM | Admit: 2019-06-06 | Discharge: 2019-06-06 | Disposition: A | Discharge status: Home / Self Care | Payer: Medicare Other | Attending: Emergency Medicine | Admitting: Emergency Medicine

## 2019-06-06 ENCOUNTER — Encounter (HOSPITAL_COMMUNITY): Payer: Self-pay

## 2019-06-06 DIAGNOSIS — Z8674 Personal history of sudden cardiac arrest: Secondary | ICD-10-CM | POA: Insufficient documentation

## 2019-06-06 DIAGNOSIS — I10 Essential (primary) hypertension: Secondary | ICD-10-CM | POA: Insufficient documentation

## 2019-06-06 DIAGNOSIS — Z79899 Other long term (current) drug therapy: Secondary | ICD-10-CM | POA: Diagnosis not present

## 2019-06-06 DIAGNOSIS — R1033 Periumbilical pain: Secondary | ICD-10-CM | POA: Diagnosis not present

## 2019-06-06 DIAGNOSIS — M352 Behcet's disease: Secondary | ICD-10-CM | POA: Insufficient documentation

## 2019-06-06 LAB — URINALYSIS, ROUTINE W REFLEX MICROSCOPIC
Bilirubin Urine: NEGATIVE
Glucose, UA: NEGATIVE mg/dL
Hgb urine dipstick: NEGATIVE
Ketones, ur: NEGATIVE mg/dL
Leukocytes,Ua: NEGATIVE
Nitrite: NEGATIVE
Protein, ur: NEGATIVE mg/dL
Specific Gravity, Urine: 1.017 (ref 1.005–1.030)
pH: 6 (ref 5.0–8.0)

## 2019-06-06 LAB — CBC
HCT: 45.4 % (ref 39.0–52.0)
Hemoglobin: 15.1 g/dL (ref 13.0–17.0)
MCH: 31.1 pg (ref 26.0–34.0)
MCHC: 33.3 g/dL (ref 30.0–36.0)
MCV: 93.6 fL (ref 80.0–100.0)
Platelets: 208 10*3/uL (ref 150–400)
RBC: 4.85 MIL/uL (ref 4.22–5.81)
RDW: 12 % (ref 11.5–15.5)
WBC: 8.4 10*3/uL (ref 4.0–10.5)
nRBC: 0 % (ref 0.0–0.2)

## 2019-06-06 LAB — COMPREHENSIVE METABOLIC PANEL
ALT: 28 U/L (ref 0–44)
AST: 22 U/L (ref 15–41)
Albumin: 4.5 g/dL (ref 3.5–5.0)
Alkaline Phosphatase: 98 U/L (ref 38–126)
Anion gap: 10 (ref 5–15)
BUN: 15 mg/dL (ref 6–20)
CO2: 25 mmol/L (ref 22–32)
Calcium: 9.4 mg/dL (ref 8.9–10.3)
Chloride: 103 mmol/L (ref 98–111)
Creatinine, Ser: 1.09 mg/dL (ref 0.61–1.24)
GFR calc Af Amer: >60 mL/min (ref >60)
GFR calc non Af Amer: >60 mL/min (ref >60)
Glucose, Bld: 96 mg/dL (ref 70–99)
Potassium: 3.8 mmol/L (ref 3.5–5.1)
Sodium: 138 mmol/L (ref 135–145)
Total Bilirubin: 0.4 mg/dL (ref 0.3–1.2)
Total Protein: 7.8 g/dL (ref 6.5–8.1)

## 2019-06-06 LAB — LIPASE, BLOOD: Lipase: 23 U/L (ref 11–51)

## 2019-06-06 MED ORDER — ONDANSETRON HCL 4 MG/2ML IJ SOLN
4.0000 mg | Freq: Once | INTRAMUSCULAR | Status: AC
  Administered 2019-06-06: 4 mg via INTRAVENOUS
  Filled 2019-06-06: qty 2

## 2019-06-06 MED ORDER — POLYETHYLENE GLYCOL 3350 17 G PO PACK: 17.0000 g | PACK | Freq: Every day | ORAL | 0 refills | Status: DC

## 2019-06-06 MED ORDER — HYDROMORPHONE HCL 1 MG/ML IJ SOLN
0.5000 mg | Freq: Once | INTRAMUSCULAR | Status: AC
  Administered 2019-06-06: 0.5 mg via INTRAVENOUS
  Filled 2019-06-06: qty 1

## 2019-06-06 MED ORDER — SODIUM CHLORIDE 0.9 % IV BOLUS
1000.0000 mL | Freq: Once | INTRAVENOUS | Status: AC
  Administered 2019-06-06: 1000 mL via INTRAVENOUS

## 2019-06-06 MED ORDER — CELECOXIB 100 MG PO CAPS: 100.0000 mg | ORAL_CAPSULE | Freq: Two times a day (BID) | ORAL | 0 refills | Status: DC

## 2019-06-06 MED ORDER — SODIUM CHLORIDE 0.9% FLUSH
3.0000 mL | Freq: Once | INTRAVENOUS | Status: AC
  Administered 2019-06-06: 3 mL via INTRAVENOUS

## 2019-06-06 MED ORDER — IOHEXOL 300 MG/ML  SOLN
100.0000 mL | Freq: Once | INTRAMUSCULAR | Status: AC | PRN
  Administered 2019-06-06: 100 mL via INTRAVENOUS

## 2019-06-06 NOTE — ED Notes (Signed)
Pt transported to the CT scan at this time

## 2019-06-06 NOTE — ED Triage Notes (Signed)
Patient c/o abdominal pain, bloating, and constipation. Patient reports that he has not had a BM in 7 days. Patient states he was referred by his PCP. Patient  Denies any N/v.

## 2019-06-06 NOTE — ED Provider Notes (Signed)
[facility] DEPT Provider Note   CSN: [id number] Arrival date & time: 06/06/19  1806     History Chief Complaint  Patient presents with  . Abdominal Pain  . Bloated  . Constipation    [name] [name] is a 39 y.o. male with a past medical history of Behcet's syndrome, previous cardiac arrest, bipolar disorder, depression, history of seizures, vitamin D deficiency, status post Nissen fundoplication, removal or xiphoid process.  Patient complains of severe abdominal pain located around his umbilicus and ventral abdomen which is worse when he standing or tries to sit up, better at rest but constant, moderate to severe.  He has not been able to make a bowel movement for the past 7 days.  He denies rectal urgency, nausea, vomiting.  He has been passing gas.  HPI     Past Medical History:  Diagnosis Date  . Behcet's disease ([place])   . Bipolar disorder ([place])   . Cardiac arrest ([place]) 12/25/2017   one round of CPR   . Depression   . Hypertension   . Migraine   . Seizures ([place])    last one 2017  . Sleep apnea    CPAP at night  . Vitamin D deficiency     Patient Active Problem List   Diagnosis Date Noted  . Chest pain with moderate risk for cardiac etiology 11/29/2018  . Cardiac arrest ([place]) 12/25/2017  . Intubation of airway performed without difficulty   . Lactic acidosis   . Acute respiratory failure with hypoxia ([place])   . Seizures ([place])   . Post-ictal coma ([place]) 08/03/2016  . Acute respiratory failure with hypoxemia ([place])   . Seizure ([place])   . Altered mental status 07/07/2016  . Peripheral vertigo 07/17/2015  . Vertigo 07/17/2015  . Intractable vomiting 07/17/2015  . Diplopia   . Cellulitis and abscess of trunk-right chest wall 05/08/2013  . Dehydration 02/15/2013  . Behcet's disease ([place]) 02/15/2013  . Recurrent vomiting 02/15/2013  . Encounter for therapeutic drug monitoring 06/14/2012  . History of oral aphthous ulcers 10/19/2010  .  BIPOLAR DISORDER UNSPECIFIED 06/18/2008  . Essential hypertension 06/18/2008  . SYNCOPE AND COLLAPSE 06/18/2008  . Epilepsy ([place]) 06/18/2008    Past Surgical History:  Procedure Laterality Date  . APPENDECTOMY    . FRACTURE SURGERY Right    Arm  . LEFT HEART CATH AND CORONARY ANGIOGRAPHY N/A 11/29/2018   Procedure: LEFT HEART CATH AND CORONARY ANGIOGRAPHY;  Surgeon: Martinique, Peter M, MD;  Location: [facility];  Service: Cardiovascular;  Laterality: N/A;  . maxilofacial    . NISSEN FUNDOPLICATION    . RADIOLOGY WITH ANESTHESIA N/A 02/13/2019   Procedure: MRI WITH ANESTHESIA OF BRAIN WITH AND WITHOUT CONTRAST;  Surgeon: Radiologist, Medication, MD;  Location: [facility];  Service: Radiology;  Laterality: N/A;       Family History  Problem Relation Age of Onset  . Lung cancer Paternal Grandfather   . Other Father        BPPV    Social History   Tobacco Use  . Smoking status: Never Smoker  . Smokeless tobacco: Never Used  Substance Use Topics  . Alcohol use: No  . Drug use: No    Home Medications Prior to Admission medications   Medication Sig Start Date End Date Taking? Authorizing Provider  Apremilast (OTEZLA) 30 MG TABS Take 30 mg by mouth 2 (two) times daily.    Yes [provider]  atorvastatin (LIPITOR) 40  MG tablet Take 1 tablet (40 mg total) by mouth daily. Patient taking differently: Take 80 mg by mouth daily.  12/11/18 06/06/19 Yes Kroeger, Lorelee Cover., PA-C  clonazePAM (KLONOPIN) 0.5 MG tablet Take 0.25 mg by mouth 3 (three) times daily.    Yes [provider]  FLUoxetine (PROZAC) 20 MG/5ML solution Take 80 mg by mouth daily.  11/27/16  Yes [provider]  lamoTRIgine (LAMICTAL ODT) 200 MG TBDP Take 200 mg by mouth 2 (two) times daily.   Yes [provider]  levETIRAcetam (KEPPRA) 100 MG/ML solution TAKE 7 ML(700 MG) BY MOUTH TWICE DAILY Patient taking differently: Take 700 mg by mouth 2 (two) times daily.  04/30/19  Yes Kathrynn Ducking, MD  metoprolol tartrate (LOPRESSOR) 25 MG tablet Take 1 tablet (25 mg total) by mouth 2 (two) times daily. Take 1/2 Tablet (12.5mg ) by mouth twice daily Patient taking differently: Take 12.5 mg by mouth 2 (two) times daily.  03/31/19  Yes Kroeger, Daleen Snook M., PA-C  mycophenolate (CELLCEPT) 500 MG tablet Take 1,000 mg by mouth 2 (two) times daily.  03/20/17  Yes [provider]  QUEtiapine (SEROQUEL) 200 MG tablet Take 200 mg by mouth at bedtime.   Yes [provider]  celecoxib (CELEBREX) 100 MG capsule Take 1 capsule (100 mg total) by mouth 2 (two) times daily. 06/06/19   Violet Seabury, PA-C  polyethylene glycol (MIRALAX / GLYCOLAX) 17 g packet Take 17 g by mouth daily. 06/06/19   Margarita Mail, PA-C    Allergies    Patient has no known allergies.  Review of Systems   Review of Systems Ten systems reviewed and are negative for acute change, except as noted in the HPI.  Physical Exam Updated Vital Signs BP (!) 140/93   Pulse 90   Temp 98 F (36.7 C)   Resp 16   Ht 6\' 1"  (1.854 m)   Wt 106.6 kg   SpO2 98%   BMI 31.00 kg/m   Physical Exam Vitals and nursing note reviewed.  Constitutional:      General: He is not in acute distress.    Appearance: He is well-developed. He is not diaphoretic.  HENT:     Head: Normocephalic and atraumatic.  Eyes:     General: No scleral icterus.    Conjunctiva/sclera: Conjunctivae normal.  Cardiovascular:     Rate and Rhythm: Normal rate and regular rhythm.     Heart sounds: Normal heart sounds.  Pulmonary:     Effort: Pulmonary effort is normal. No respiratory distress.     Breath sounds: Normal breath sounds.  Abdominal:     Palpations: Abdomen is soft.     Tenderness: There is abdominal tenderness.     Hernia: A hernia is present. Hernia is present in the umbilical area and ventral area.  Musculoskeletal:     Cervical back: Normal range of motion and neck supple.  Skin:    General: Skin is warm and dry.    Neurological:     Mental Status: He is alert.  Psychiatric:        Behavior: Behavior normal.     ED Results / Procedures / Treatments   Labs (all labs ordered are listed, but only abnormal results are displayed) Labs Reviewed  LIPASE, BLOOD  COMPREHENSIVE METABOLIC PANEL  CBC  URINALYSIS, ROUTINE W REFLEX MICROSCOPIC    EKG None  Radiology CT ABDOMEN PELVIS W CONTRAST  Result Date: 06/06/2019 CLINICAL DATA:  Abdominal pain with bloating and  constipation. EXAM: CT ABDOMEN AND PELVIS WITH CONTRAST TECHNIQUE: Multidetector CT imaging of the abdomen and pelvis was performed using the standard protocol following bolus administration of intravenous contrast. CONTRAST:  157mL OMNIPAQUE IOHEXOL 300 MG/ML  SOLN COMPARISON:  January 10, 2019 FINDINGS: Lower chest: No acute abnormality. Hepatobiliary: No focal liver abnormality is seen. No gallstones, gallbladder wall thickening, or biliary dilatation. Pancreas: Unremarkable. No pancreatic ductal dilatation or surrounding inflammatory changes. Spleen: Normal in size without focal abnormality. Adrenals/Urinary Tract: Adrenal glands are unremarkable. Kidneys are normal, without renal calculi, focal lesion, or hydronephrosis. Bladder is unremarkable. Stomach/Bowel: Stomach is within normal limits. The appendix is not identified. No evidence of bowel wall thickening, distention, or inflammatory changes. Vascular/Lymphatic: No significant vascular findings are present. No enlarged abdominal or pelvic lymph nodes. Reproductive: Prostate is unremarkable. Other: No abdominal wall hernia or abnormality. No abdominopelvic ascites. Musculoskeletal: No acute or significant osseous findings. IMPRESSION: No acute intra-abdominal findings. Electronically Signed   By: Virgina Norfolk M.D.   On: 06/06/2019 22:53    Procedures Procedures (including critical care time)  Medications Ordered in ED Medications  sodium chloride flush (NS) 0.9 % injection 3 mL (3 mLs  Intravenous Given 06/06/19 2005)  sodium chloride 0.9 % bolus 1,000 mL (0 mLs Intravenous Stopped 06/06/19 2156)  HYDROmorphone (DILAUDID) injection 0.5 mg (0.5 mg Intravenous Given 06/06/19 2005)  ondansetron (ZOFRAN) injection 4 mg (4 mg Intravenous Given 06/06/19 2005)  iohexol (OMNIPAQUE) 300 MG/ML solution 100 mL (100 mLs Intravenous Contrast Given 06/06/19 2236)    ED Course  I have reviewed the triage vital signs and the nursing notes.  Pertinent labs & imaging results that were available during my care of the patient were reviewed by me and considered in my medical decision making (see chart for details).    MDM Rules/Calculators/A&P                      39 year old male here with abdominal pain, complaint of no bowel movements for about 1 week.  He is immunocompromise on mycophenolate and Otezla.  On physical examination he has obvious rectus diastases.  There is a small supra umbilical incision where he is most tender suggestive of hernia.  Is worse whenever he Valsalvas or tries to sit up.  Differential diagnosis includes small bowel obstruction, gastritis, enteritis or colitis less likely as patient is not having vomiting or diarrhea, incarcerated hernia, inflammatory involvement secondary to his Behcet's syndrome of the mucosal surfaces of his intestines. I reviewed the patient's labs UA, lipase, CBC and CMP without any abnormalities.  I personally reviewed the patient's abdominal CT scan which shows no acute abnormalities on my interpretation.  I still however feel that the patient may have incisional hernia which is nonobstructed and potentially transient in the abdomen.  His pain is improved after medications and fluids.  He has had no active vomiting.  Is afebrile and hemodynamically stable and I feel that he is safe for discharge at this time with outpatient surgical follow-up.  Patient agrees with plan, and understands return precautions and appears appropriate for discharge. Final  Clinical Impression(s) / ED Diagnoses Final diagnoses:  Periumbilical abdominal pain    Rx / DC Orders ED Discharge Orders         Ordered    polyethylene glycol (MIRALAX / GLYCOLAX) 17 g packet  Daily     06/06/19 2322    celecoxib (CELEBREX) 100 MG capsule  2 times daily     06/06/19  Apple[place], Kamiah, PA-C 06/07/19 [place], MD 06/09/19 [id number]

## 2019-06-06 NOTE — Discharge Instructions (Signed)

## 2019-06-19 ENCOUNTER — Other Ambulatory Visit: Payer: Self-pay

## 2019-06-19 ENCOUNTER — Ambulatory Visit: Payer: Medicare Other | Attending: Neurology | Admitting: Occupational Therapy

## 2019-06-19 ENCOUNTER — Encounter: Payer: Self-pay | Admitting: Occupational Therapy

## 2019-06-19 DIAGNOSIS — R208 Other disturbances of skin sensation: Secondary | ICD-10-CM

## 2019-06-19 DIAGNOSIS — R209 Unspecified disturbances of skin sensation: Secondary | ICD-10-CM | POA: Diagnosis not present

## 2019-06-19 DIAGNOSIS — M6281 Muscle weakness (generalized): Secondary | ICD-10-CM | POA: Diagnosis present

## 2019-06-19 DIAGNOSIS — R278 Other lack of coordination: Secondary | ICD-10-CM | POA: Insufficient documentation

## 2019-06-19 NOTE — Patient Instructions (Signed)
11. Grip Strengthening (Resistive Putty)   Squeeze putty using thumb and all fingers. Repeat 15 times. Do 1-2 sessions per day.   Extension (Assistive Putty)   Roll putty back and forth, being sure to use all fingertips. Repeat 3 times. Do 1-2 sessions per day.  Then pinch as below.   Palmar Pinch Strengthening (Resistive Putty)   Pinch putty between thumb and each fingertip in turn after rolling out    Finger and Thumb Extension (Resistive Putty)         IP Fisting (Resistive Putty)   Keeping knuckles straight, bend fingertips to squeeze putty. Repeat 10 times. Do 1-2 sessions per day.  MP Flexion (Resistive Putty)

## 2019-06-19 NOTE — Therapy (Signed)
[facility] [address] [place], [place], [address] Phone: [phone]   Fax:  [phone]  Occupational Therapy Evaluation  Patient Details  Name: [name] [name] MRN: [id number] Date of Birth: [birth year] Referring Provider (OT): Dr Jannifer Franklin   Encounter Date: 06/19/2019  OT End of Session - 06/19/19 1908    Visit Number  1    Number of Visits  5    Date for OT Re-Evaluation  08/18/19    Authorization Type  MCR    Progress Note Due on Visit  10    OT Start Time  1315    OT Stop Time  1400    OT Time Calculation (min)  45 min    Activity Tolerance  Patient tolerated treatment well    Behavior During Therapy  [facility] for tasks assessed/performed       Past Medical History:  Diagnosis Date  . Behcet's disease ([place])   . Bipolar disorder ([place])   . Cardiac arrest ([place]) 12/25/2017   one round of CPR   . Depression   . Hypertension   . Migraine   . Seizures ([place])    last one 2017  . Sleep apnea    CPAP at night  . Vitamin D deficiency     Past Surgical History:  Procedure Laterality Date  . APPENDECTOMY    . FRACTURE SURGERY Right    Arm  . LEFT HEART CATH AND CORONARY ANGIOGRAPHY N/A 11/29/2018   Procedure: LEFT HEART CATH AND CORONARY ANGIOGRAPHY;  Surgeon: Martinique, Peter M, MD;  Location: [facility];  Service: Cardiovascular;  Laterality: N/A;  . maxilofacial    . NISSEN FUNDOPLICATION    . RADIOLOGY WITH ANESTHESIA N/A 02/13/2019   Procedure: MRI WITH ANESTHESIA OF BRAIN WITH AND WITHOUT CONTRAST;  Surgeon: Radiologist, Medication, MD;  Location: [facility];  Service: Radiology;  Laterality: N/A;    There were no vitals filed for this visit.  Subjective Assessment - 06/19/19 1321    Subjective   Try to get some feeling back in my hand.  I want to use my hand better    Currently in Pain?  No/denies    Pain Score  0-No pain        [facility] OT Assessment - 06/19/19 0001      Assessment   Medical Diagnosis   Right hand numbness    Referring Provider (OT)  Dr Jannifer Franklin    Onset Date/Surgical Date  05/06/19    Hand Dominance  Right    Prior Therapy  NA      Precautions   Precautions  --   SEIZURES     Restrictions   Weight Bearing Restrictions  No      ADL   Eating/Feeding  Minimal assistance    Grooming  Modified independent    Upper Body Bathing  Independent    Lower Body Bathing  Independent    Upper Body Dressing  Minimal assistance    Lower Body Dressing  Minimal assistance    Toilet Transfer  Independent    Toileting - Surveyor, minerals      Written Expression   Dominant Hand  Right    Handwriting  75% legible      Posture/Postural Control   Posture/Postural Control  No significant limitations      Sensation   Light Touch  Impaired by gross assessment    Stereognosis  Appears Intact  Hot/Cold  Impaired by gross assessment    Proprioception  Appears Intact      Coordination   Gross Motor Movements are Fluid and Coordinated  No    Fine Motor Movements are Fluid and Coordinated  No    Finger Nose Finger Test  Dysmetric movement - undershooting    9 Hole Peg Test  Right;Left    Right 9 Hole Peg Test  30.59    Left 9 Hole Peg Test  18.90      Perception   Perception  Within Functional Limits      Praxis   Praxis  Impaired      ROM / Strength   AROM / PROM / Strength  AROM;Strength      AROM   Overall AROM   Within functional limits for tasks performed    Overall AROM Comments  BUE      Strength   Overall Strength  Within functional limits for tasks performed    Overall Strength Comments  BUE      Hand Function   Right Hand Gross Grasp  Impaired    Right Hand Grip (lbs)  30    Right Hand Lateral Pinch  22 lbs    Right Hand 3 Point Pinch  22 lbs    Left Hand Gross Grasp  Functional    Left Hand Grip (lbs)  80    Left Hand Lateral Pinch  22 lbs    Left 3 point pinch  20 lbs                       OT Education - 06/19/19 1907    Education Details  Reviewed eval results.  Issues yellow putty and exercises    Person(s) Educated  Patient    Methods  Explanation;Demonstration;Tactile cues;Verbal cues;Handout    Comprehension  Verbalized understanding;Returned demonstration          OT Long Term Goals - 06/19/19 1913      OT LONG TERM GOAL #1   Title  Patient will complete a hand strengthening program    Time  4    Period  Weeks    Status  New    Target Date  07/19/19      OT LONG TERM GOAL #2   Title  Patient will complete a home exercise program to improve hand coordination    Time  4    Period  Weeks    Status  New      OT LONG TERM GOAL #3   Title  Patient will carry an open 1/2 full  can of soda across 15 foot floor space using right UE    Time  4    Period  Weeks    Status  New      OT LONG TERM GOAL #4   Title  Patient will obtain cooking pot from cupboard, fill with water,  and carry to stove top using BUE    Time  4    Period  Weeks    Status  New      OT LONG TERM GOAL #5   Title  Patient will report improved ability to open/close fasteners on clothing - buttons, zippers, laces    Time  4    Period  Weeks    Status  New      Long Term Additional Goals   Additional Long Term Goals  Yes      OT LONG TERM  GOAL #6   Title  Patient will demonstrate 8 lb improvement in grip strength    Baseline  30    Time  4    Period  Weeks    Status  New      OT LONG TERM GOAL #7   Title  Patient will demonstrate 5 sec decrease in time on 9 hole peg test to demo improved fine motor skills as needed for dressing, manipulation    Time  4    Period  Weeks    Status  New            Plan - 06/19/19 1909    Clinical Impression Statement  Patient is a 39 year old male referred for dominant right hand numbness s/p cardiac event recently.  Patient presents with marked sensory loss in ulnar nerve distribution although EMG results  were without findings.  Patient with significant decreased strengtha dn coordiantion in right UE, and patient with some learned non-use of RUE.  Patient will benefit form skilled OT intervetion to improve strngth and functional use in  RUE.    OT Occupational Profile and History  Detailed Assessment- Review of Records and additional review of physical, cognitive, psychosocial history related to current functional performance    Occupational performance deficits (Please refer to evaluation for details):  ADL's;IADL's    Body Structure / Function / Physical Skills  ADL;Coordination;GMC;UE functional use;Decreased knowledge of precautions;Sensation;IADL;Dexterity;FMC;Strength    Rehab Potential  Good    Clinical Decision Making  Limited treatment options, no task modification necessary    Comorbidities Affecting Occupational Performance:  May have comorbidities impacting occupational performance   Behcet's disease, Bipolar, nonintractable epilepsy   Modification or Assistance to Complete Evaluation   Min-Moderate modification of tasks or assist with assess necessary to complete eval    OT Frequency  1x / week    OT Duration  4 weeks    OT Treatment/Interventions  Self-care/ADL training;Therapeutic exercise;Patient/family education;Therapeutic activities;Manual Therapy;DME and/or AE instruction    Plan  Coord program, functional strengthening, in hand manip    OT Home Exercise Plan  putty 3/18    Consulted and Agree with Plan of Care  Patient       Patient will benefit from skilled therapeutic intervention in order to improve the following deficits and impairments:   Body Structure / Function / Physical Skills: ADL, Coordination, GMC, UE functional use, Decreased knowledge of precautions, Sensation, IADL, Dexterity, FMC, Strength       Visit Diagnosis: Other disturbances of skin sensation - Plan: Ot plan of care cert/re-cert  Muscle weakness (generalized) - Plan: Ot plan of care  cert/re-cert  Other lack of coordination - Plan: Ot plan of care cert/re-cert    Problem List Patient Active Problem List   Diagnosis Date Noted  . Chest pain with moderate risk for cardiac etiology 11/29/2018  . Cardiac arrest ([place]) 12/25/2017  . Intubation of airway performed without difficulty   . Lactic acidosis   . Acute respiratory failure with hypoxia ([place])   . Seizures ([place])   . Post-ictal coma ([place]) 08/03/2016  . Acute respiratory failure with hypoxemia ([place])   . Seizure ([place])   . Altered mental status 07/07/2016  . Peripheral vertigo 07/17/2015  . Vertigo 07/17/2015  . Intractable vomiting 07/17/2015  . Diplopia   . Cellulitis and abscess of trunk-right chest wall 05/08/2013  . Dehydration 02/15/2013  . Behcet's disease ([place]) 02/15/2013  . Recurrent vomiting 02/15/2013  . Encounter for therapeutic drug  monitoring 06/14/2012  . History of oral aphthous ulcers 10/19/2010  . BIPOLAR DISORDER UNSPECIFIED 06/18/2008  . Essential hypertension 06/18/2008  . SYNCOPE AND COLLAPSE 06/18/2008  . Epilepsy ([place]) 06/18/2008    Mariah Milling, OTR/L 06/19/2019, 7:21 PM  [facility] [address] [place], [place], [address] Phone: [phone]   Fax:  [phone]  Name: [name] [name] MRN: [id number] Date of Birth: [birth year]

## 2019-06-26 ENCOUNTER — Other Ambulatory Visit: Payer: Self-pay

## 2019-06-26 ENCOUNTER — Ambulatory Visit: Payer: Medicare Other | Admitting: Occupational Therapy

## 2019-06-26 ENCOUNTER — Encounter: Payer: Self-pay | Admitting: Occupational Therapy

## 2019-06-26 DIAGNOSIS — M6281 Muscle weakness (generalized): Secondary | ICD-10-CM

## 2019-06-26 DIAGNOSIS — R208 Other disturbances of skin sensation: Secondary | ICD-10-CM

## 2019-06-26 DIAGNOSIS — R278 Other lack of coordination: Secondary | ICD-10-CM

## 2019-06-26 DIAGNOSIS — R209 Unspecified disturbances of skin sensation: Secondary | ICD-10-CM | POA: Diagnosis not present

## 2019-06-26 NOTE — Patient Instructions (Signed)
  Coordination Activities  Perform the following activities for 5-10 minutes 1-2 times per day with right hand(s).   Rotate ball in fingertips (clockwise and counter-clockwise).  Toss ball in air and catch with the same hand.  Flip cards 1 at a time as fast as you can.  Rotate card in hand (clockwise and counter-clockwise).  Shuffle cards.  Pick up coins and place in container or coin bank.  Pick up coins and stack.  Pick up coins one at a time until you get 5-10 in your hand, then move coins from palm to fingertips to stack one at a time.  Practice writing and/or typing.

## 2019-06-26 NOTE — Therapy (Signed)
[facility] [address] [place], [place], [address] Phone: [phone]   Fax:  [phone]  Occupational Therapy Treatment  Patient Details  Name: FLABIO VRADENBURG MRN: [id number] Date of Birth: 1980/08/25 Referring Provider (OT): Dr Jannifer Franklin   Encounter Date: 06/26/2019  OT End of Session - 06/26/19 1558    Visit Number  2    Number of Visits  5    Date for OT Re-Evaluation  08/18/19    Authorization Type  MCR    OT Start Time  1315    OT Stop Time  1400    OT Time Calculation (min)  45 min    Activity Tolerance  Patient tolerated treatment well    Behavior During Therapy  [facility] for tasks assessed/performed       Past Medical History:  Diagnosis Date  . Behcet's disease ([place])   . Bipolar disorder ([place])   . Cardiac arrest ([place]) 12/25/2017   one round of CPR   . Depression   . Hypertension   . Migraine   . Seizures ([place])    last one 2017  . Sleep apnea    CPAP at night  . Vitamin D deficiency     Past Surgical History:  Procedure Laterality Date  . APPENDECTOMY    . FRACTURE SURGERY Right    Arm  . LEFT HEART CATH AND CORONARY ANGIOGRAPHY N/A 11/29/2018   Procedure: LEFT HEART CATH AND CORONARY ANGIOGRAPHY;  Surgeon: Martinique, Peter M, MD;  Location: [facility];  Service: Cardiovascular;  Laterality: N/A;  . maxilofacial    . NISSEN FUNDOPLICATION    . RADIOLOGY WITH ANESTHESIA N/A 02/13/2019   Procedure: MRI WITH ANESTHESIA OF BRAIN WITH AND WITHOUT CONTRAST;  Surgeon: Radiologist, Medication, MD;  Location: [facility];  Service: Radiology;  Laterality: N/A;    There were no vitals filed for this visit.  Subjective Assessment - 06/26/19 1318    Subjective   Patient indicates that putty exercises are going really well - using at least twice/day    Currently in Pain?  No/denies    Pain Score  0-No pain                   OT Treatments/Exercises (OP) - 06/26/19 0001      ADLs   ADL  Comments  Reviewed goals for this plan of car- patient agreeabke and states he read on my chart.        Neurological Re-education Exercises   Other Exercises 1  Patient with improved grip strength today - 35lb right hand.  Patient with efficient use of thumb, index and long finger.  Needs cueing and facilitation to incorporate ring, and pinky into in hand manipulation tasks.  DId best with overt cueing and  increased attention to hand.  Patient with tendency to move very rapidly, and then with even less control.  With slower rate of speed, and direct visual attention, patient able to utilize 4th and 5th digits to assist with manipulating small objects,  Did some blocking exercises for range and strengthening at PIP/MCP 4/5 - patient has 4-/5 strength here.               OT Education - 06/26/19 1557    Education Details  Upgraded to red putty, added coordiantion exercises, cued for overt visual attention and concentration for activation of 4th/5th digits    Person(s) Educated  Patient    Methods  Explanation;Demonstration;Tactile cues;Verbal cues;Handout  Comprehension  Verbalized understanding;Returned demonstration;Need further instruction          OT Long Term Goals - 06/26/19 1559      OT LONG TERM GOAL #1   Title  Patient will complete a hand strengthening program    Status  Achieved      OT LONG TERM GOAL #2   Title  Patient will complete a home exercise program to improve hand coordination    Status  On-going      OT LONG TERM GOAL #3   Title  Patient will carry an open 1/2 full  can of soda across 15 foot floor space using right UE    Status  On-going      OT LONG TERM GOAL #4   Title  Patient will obtain cooking pot from cupboard, fill with water,  and carry to stove top using BUE    Status  On-going      OT LONG TERM GOAL #5   Status  On-going      OT LONG TERM GOAL #6   Title  Patient will demonstrate 8 lb improvement in grip strength    Status  On-going             Plan - 06/26/19 1558    Clinical Impression Statement  Patient is working hard to improve hand strength.  Patient has some learned disuse of RUE, which should improve with continued focus.    OT Frequency  1x / week    OT Duration  4 weeks    OT Treatment/Interventions  Self-care/ADL training;Therapeutic exercise;Patient/family education;Therapeutic activities;Manual Therapy;DME and/or AE instruction    Plan  Strength, coordination, in hand manipulation -    OT Home Exercise Plan  putty 3/18, coordination 3/25    Consulted and Agree with Plan of Care  Patient       Patient will benefit from skilled therapeutic intervention in order to improve the following deficits and impairments:           Visit Diagnosis: Other disturbances of skin sensation  Muscle weakness (generalized)  Other lack of coordination    Problem List Patient Active Problem List   Diagnosis Date Noted  . Chest pain with moderate risk for cardiac etiology 11/29/2018  . Cardiac arrest ([place]) 12/25/2017  . Intubation of airway performed without difficulty   . Lactic acidosis   . Acute respiratory failure with hypoxia ([place])   . Seizures ([place])   . Post-ictal coma ([place]) 08/03/2016  . Acute respiratory failure with hypoxemia ([place])   . Seizure ([place])   . Altered mental status 07/07/2016  . Peripheral vertigo 07/17/2015  . Vertigo 07/17/2015  . Intractable vomiting 07/17/2015  . Diplopia   . Cellulitis and abscess of trunk-right chest wall 05/08/2013  . Dehydration 02/15/2013  . Behcet's disease ([place]) 02/15/2013  . Recurrent vomiting 02/15/2013  . Encounter for therapeutic drug monitoring 06/14/2012  . History of oral aphthous ulcers 10/19/2010  . BIPOLAR DISORDER UNSPECIFIED 06/18/2008  . Essential hypertension 06/18/2008  . SYNCOPE AND COLLAPSE 06/18/2008  . Epilepsy ([place]) 06/18/2008    Mariah Milling, OTR/L 06/26/2019, 4:01 PM  [facility] [address] [place], [place], [address] Phone: [phone]   Fax:  [phone]  Name: JAXX TLATELPA MRN: [id number] Date of Birth: [birth year]

## 2019-06-30 ENCOUNTER — Ambulatory Visit: Payer: Medicare Other | Admitting: Occupational Therapy

## 2019-07-09 ENCOUNTER — Encounter: Payer: Self-pay | Admitting: Occupational Therapy

## 2019-07-09 ENCOUNTER — Ambulatory Visit: Payer: Medicare Other | Attending: Neurology | Admitting: Occupational Therapy

## 2019-07-09 ENCOUNTER — Other Ambulatory Visit: Payer: Self-pay

## 2019-07-09 DIAGNOSIS — R208 Other disturbances of skin sensation: Secondary | ICD-10-CM

## 2019-07-09 DIAGNOSIS — R209 Unspecified disturbances of skin sensation: Secondary | ICD-10-CM | POA: Insufficient documentation

## 2019-07-09 DIAGNOSIS — M6281 Muscle weakness (generalized): Secondary | ICD-10-CM | POA: Insufficient documentation

## 2019-07-09 DIAGNOSIS — R278 Other lack of coordination: Secondary | ICD-10-CM | POA: Insufficient documentation

## 2019-07-09 NOTE — Therapy (Signed)
[facility] [address] [place], [place], [address] Phone: [phone]   Fax:  [phone]  Occupational Therapy Treatment  Patient Details  Name: [name] [name] MRN: [id number] Date of Birth: 1981-02-16 Referring Provider (OT): Dr Jannifer Franklin   Encounter Date: 07/09/2019  OT End of Session - 07/09/19 1439    Visit Number  3    Number of Visits  5    Date for OT Re-Evaluation  08/18/19    Authorization Type  MCR    Progress Note Due on Visit  10    OT Start Time  1402    OT Stop Time  1433    OT Time Calculation (min)  31 min    Activity Tolerance  Patient tolerated treatment well    Behavior During Therapy  [facility] for tasks assessed/performed       Past Medical History:  Diagnosis Date  . Behcet's disease ([place])   . Bipolar disorder ([place])   . Cardiac arrest ([place]) 12/25/2017   one round of CPR   . Depression   . Hypertension   . Migraine   . Seizures ([place])    last one 2017  . Sleep apnea    CPAP at night  . Vitamin D deficiency     Past Surgical History:  Procedure Laterality Date  . APPENDECTOMY    . FRACTURE SURGERY Right    Arm  . LEFT HEART CATH AND CORONARY ANGIOGRAPHY N/A 11/29/2018   Procedure: LEFT HEART CATH AND CORONARY ANGIOGRAPHY;  Surgeon: Martinique, Peter M, MD;  Location: [facility];  Service: Cardiovascular;  Laterality: N/A;  . maxilofacial    . NISSEN FUNDOPLICATION    . RADIOLOGY WITH ANESTHESIA N/A 02/13/2019   Procedure: MRI WITH ANESTHESIA OF BRAIN WITH AND WITHOUT CONTRAST;  Surgeon: Radiologist, Medication, MD;  Location: [facility];  Service: Radiology;  Laterality: N/A;    There were no vitals filed for this visit.  Subjective Assessment - 07/09/19 1404    Subjective   I feel that the range of motion is better in my hand.    Currently in Pain?  No/denies    Pain Score  0-No pain         [facility] OT Assessment - 07/09/19 0001      Coordination   Right 9 Hole Peg Test  24,19/  20.44               OT Treatments/Exercises (OP) - 07/09/19 0001      ADLs   Eating  Worked on carrying plastic, styrofoam, and glass containers of waterin right hand without difficulty.  Patient able to recognize that he automatically uses left UE - Where he has functional ability in right hand.  Discussed that at this point he needs to change his mindset to force his right hand use as more automatic.      Cooking  Reviewed remaining goals.  Patient able to carry a pot filled with water predominantly with right hand.      ADL Comments  All OT goals met - patient agreeable to early OT discharge.             OT Education - 07/09/19 1438    Education Details  Reviewed remaining goals, and importance of continuing to use right hand functionally    Person(s) Educated  Patient    Methods  Explanation    Comprehension  Verbalized understanding  OT Long Term Goals - 07/09/19 1405      OT LONG TERM GOAL #1   Title  Patient will complete a hand strengthening program    Status  Achieved      OT LONG TERM GOAL #2   Title  Patient will complete a home exercise program to improve hand coordination    Status  Achieved      OT LONG TERM GOAL #3   Status  Achieved      OT LONG TERM GOAL #4   Title  Patient will obtain cooking pot from cupboard, fill with water,  and carry to stove top using BUE    Status  Achieved      OT LONG TERM GOAL #5   Title  Patient will report improved ability to open/close fasteners on clothing - buttons, zippers, laces    Status  Achieved      OT LONG TERM GOAL #6   Title  Patient will demonstrate 8 lb improvement in grip strength    Status  Achieved      OT LONG TERM GOAL #7   Title  Patient will demonstrate 5 sec decrease in time on 9 hole peg test to demo improved fine motor skills as needed for dressing, manipulation    Status  Achieved            Plan - 07/09/19 1439    Clinical Impression Statement  Patient has met  all therapy goals and is dilligent about his home exercise program.  Patient agreeable to OT discharge.    OT Frequency  1x / week    OT Duration  4 weeks    OT Treatment/Interventions  Self-care/ADL training;Therapeutic exercise;Patient/family education;Therapeutic activities;Manual Therapy;DME and/or AE instruction    Plan  d/c       Patient will benefit from skilled therapeutic intervention in order to improve the following deficits and impairments:           Visit Diagnosis: Other disturbances of skin sensation  Muscle weakness (generalized)  Other lack of coordination    Problem List Patient Active Problem List   Diagnosis Date Noted  . Chest pain with moderate risk for cardiac etiology 11/29/2018  . Cardiac arrest ([place]) 12/25/2017  . Intubation of airway performed without difficulty   . Lactic acidosis   . Acute respiratory failure with hypoxia ([place])   . Seizures ([place])   . Post-ictal coma ([place]) 08/03/2016  . Acute respiratory failure with hypoxemia ([place])   . Seizure ([place])   . Altered mental status 07/07/2016  . Peripheral vertigo 07/17/2015  . Vertigo 07/17/2015  . Intractable vomiting 07/17/2015  . Diplopia   . Cellulitis and abscess of trunk-right chest wall 05/08/2013  . Dehydration 02/15/2013  . Behcet's disease ([place]) 02/15/2013  . Recurrent vomiting 02/15/2013  . Encounter for therapeutic drug monitoring 06/14/2012  . History of oral aphthous ulcers 10/19/2010  . BIPOLAR DISORDER UNSPECIFIED 06/18/2008  . Essential hypertension 06/18/2008  . SYNCOPE AND COLLAPSE 06/18/2008  . Epilepsy ([place]) 06/18/2008  OCCUPATIONAL THERAPY DISCHARGE SUMMARY  Visits from Start of Care: 3  Current functional level related to goals / functional outcomes: Improved coordination and strength leading to improved use of RUE   Remaining deficits: RUE Numbness   Education / Equipment: HEP - strengthening and coordination Plan: Patient agrees to discharge.  Patient goals  were partially met. Patient is being discharged due to meeting the stated rehab goals.  ?????      Marlowe Sax  M, OTR/L 07/09/2019, 2:40 PM  [facility] [address] [place], [place], [address] Phone: [phone]   Fax:  [phone]  Name: [name] [name] MRN: [id number] Date of Birth: 1980-06-19

## 2019-07-16 ENCOUNTER — Encounter: Payer: Medicare Other | Admitting: Occupational Therapy

## 2019-07-22 ENCOUNTER — Other Ambulatory Visit: Payer: Self-pay | Admitting: Neurology

## 2019-07-22 ENCOUNTER — Ambulatory Visit (INDEPENDENT_AMBULATORY_CARE_PROVIDER_SITE_OTHER): Payer: Medicare Other | Admitting: Cardiovascular Disease

## 2019-07-22 ENCOUNTER — Other Ambulatory Visit: Payer: Self-pay

## 2019-07-22 ENCOUNTER — Encounter: Payer: Self-pay | Admitting: Cardiovascular Disease

## 2019-07-22 VITALS — BP 129/88 | HR 91 | Temp 98.3°F | Resp 11 | Ht 73.0 in | Wt 235.8 lb

## 2019-07-22 DIAGNOSIS — R55 Syncope and collapse: Secondary | ICD-10-CM | POA: Diagnosis not present

## 2019-07-22 NOTE — Patient Instructions (Signed)
Medication Instructions: No changes  * If you need a refill on your cardiac medications before your next appointment, please call your pharmacy.   Labwork: None ordered If you have labs (blood work) drawn today and your tests are completely normal, you will receive your results only by: [facility] (if you have MyChart) OR A paper copy in the mail If you have any lab test that is abnormal or we need to change your treatment, we will call you to review the results.  Testing/Procedures: Your physician has recommended that you have a loop recorder implant. Please follow the instructions below, located under the special instructions section.  Follow-Up: Your physician recommends that you schedule a wound check appointment 10-14 days, after your procedure on 08/13/19, with the device clinic.  Follow-Up: At [facility], you and your health needs are our priority.  As part of our continuing mission to provide you with exceptional heart care, we have created designated Provider Care Teams.  These Care Teams include your primary Cardiologist (physician) and Advanced Practice Providers (APPs -  Physician Assistants and Nurse Practitioners) who all work together to provide you with the care you need, when you need it.  Your next appointment:   12 month(s)  The format for your next appointment:   In Person  Provider:   {Providers/Teams        :[id number]  Special Instructions:   [facility] [place], [place]  [place], [place] [address]  Phone: [phone] Fax: [phone]   You are scheduled for a Loop Recorder Implant on 08/13/19  at 10:30 am with Dr. Sallyanne Kuster. This will take place at [facility], [address].    Please arrive at your appointment 15-20 minutes early.    You do not need to be fasting.    The procedure is performed with local anesthesia. You will not receive sedatives nor will an IV be placed.    Wash your chest  and neck with the surgical soap the evening before and the morning of your procedure. Please following the washing instructions provided.    As with all surgical implants, there is a small risk of infection. If an infection occurs, the device will be removed. To help reduce the risk, please use the surgical scrub provided. Additional antiseptic precautions will be taken at the time of the procedure.    Please bring your insurance cards.   *Please note that scheduled loop recorder implants may need to be rescheduled if Dr. Sallyanne Kuster has a procedure urgently added on at the hospital   Preparing for the Procedure  Before the procedure, you can play an important role. Because skin is not sterile, your skin needs to be as free of germs as possible. You can reduce the number of germs on your skin by washing with CHG (chlorhexidine gluconate) Soap before the procedure. CHG is an antiseptic cleaner which kills germs and bonds with the skin to continue killing germs even after washing.  Please do not use if you have an allergy to CHG or antibacterial soaps. If your skin becomes reddened/irritated, STOP using the CHG.  DO NOT SHAVE (including legs and underarms) for at least 48 hours prior to first CHG shower. It is OK to shave your face.  Please follow these instructions carefully: 1. Shower the night before the procedure and the morning of with CHG Soap. 2. If you chose to wash your hair, wash your hair first as usual with your  normal shampoo/conditioner. 3. After you shampoo/condition, rinse you hair and body thoroughly to remove shampoo/conditioner. 4. Use CHG as you would any other liquid soap. You can apply CHG directly to the skin and wash gently with a loofah or a clean washcloth. 5. Apply the CHG Soap to your body ONLY FROM THE NECK DOWN. Do not use on open wounds or open sores. Avoid contact with your eyes, ears, mouth, and genitals (private parts).  6. Wash thoroughly, paying special  attention to the area where your surgery will be performed. 7. Thoroughly rinse your body with warm water from the neck down. 8. DO NOT shower/wash with your normal soap after using and rinsing off the CHG Soap. 9. Pat yourself dry with a clean towel. 10. Wear clean pajamas to bed. 11. Place clean sheets on your bed the night of your first shower and do not sleep with pets..  Day of Surgery: Shower with the CHG Soap following the instructions listed above. DO NOT apply deodorants or lotions.

## 2019-07-22 NOTE — Progress Notes (Signed)
Cardiology Office Note:    Date:  07/22/2019   ID:  [name] [name], DOB 07/05/1980, MRN [id number]  PCP:  Leighton Ruff, MD  Cardiologist:  Sanda Klein, MD  Electrophysiologist:  None   Referring MD: Leighton Ruff, MD   Chief Complaint  Patient presents with  . Near Syncope    History of Present Illness:    [name] [name] is a 39 y.o. male with a hx of Behcet sd.,  Bipolar disorder, seizures, migraine headaches, obstructive sleep apnea CPAP, with a reported cardiac arrest event in September 2019.  Since then he continues to have episodes of unexplained dizziness/near syncope.  He relates the episodes of near syncope to physical activity.  If he walks too fast or exerts himself (for example pushing a lawnmower) too much he becomes flushed, diaphoretic and lightheaded.  If he does not quickly sit down he feels he will pass out.  He believes that these episodes more commonly occur when he is having an outbreak of Behcet's syndrome and is "inflamed all over".  He does not have nausea or vomiting or focal neurological events or palpitations during the episodes of near syncope.  In 2019 he had an episode of reported cardiac arrest at a swimming pool, reportedly as he was climbing out of the pool.  A lifeguard apparently performed just 1 or 2 chest compressions, but due to altered mental status he was intubated.  No arrhythmia was detected.  ECG was normal and subsequent cardiac work-up has been negative (normal echo and oncological nuclear stress test, culminating in coronary angiography in August 2020 which shows normal left ventricular systolic function and normal coronary anatomy).  A previous event monitor showed only episodes of sinus tachycardia.  No bradycardia and no ventricular arrhythmia.  Past Medical History:  Diagnosis Date  . Behcet's disease ([place])   . Bipolar disorder ([place])   . Cardiac arrest ([place]) 12/25/2017   one round of CPR   . Depression   . Hypertension   .  Migraine   . Seizures ([place])    last one 2017  . Sleep apnea    CPAP at night  . Vitamin D deficiency     Past Surgical History:  Procedure Laterality Date  . APPENDECTOMY    . FRACTURE SURGERY Right    Arm  . LEFT HEART CATH AND CORONARY ANGIOGRAPHY N/A 11/29/2018   Procedure: LEFT HEART CATH AND CORONARY ANGIOGRAPHY;  Surgeon: Martinique, Peter M, MD;  Location: [facility];  Service: Cardiovascular;  Laterality: N/A;  . maxilofacial    . NISSEN FUNDOPLICATION    . RADIOLOGY WITH ANESTHESIA N/A 02/13/2019   Procedure: MRI WITH ANESTHESIA OF BRAIN WITH AND WITHOUT CONTRAST;  Surgeon: Radiologist, Medication, MD;  Location: [facility];  Service: Radiology;  Laterality: N/A;    Current Medications: Current Meds  Medication Sig  . Apremilast (OTEZLA) 30 MG TABS Take 30 mg by mouth 2 (two) times daily.   Marland Kitchen atorvastatin (LIPITOR) 40 MG tablet Take 1 tablet (40 mg total) by mouth daily. (Patient taking differently: Take 80 mg by mouth daily. )  . clonazePAM (KLONOPIN) 0.5 MG tablet Take 0.25 mg by mouth 3 (three) times daily.   Marland Kitchen FLUoxetine (PROZAC) 20 MG/5ML solution Take 80 mg by mouth daily.   Marland Kitchen lamoTRIgine 200 MG TBDP DISSOLVE 1 TABLET BY MOUTH TWICE DAILY(MORNING AND BEDTIME)  . levETIRAcetam (KEPPRA) 100 MG/ML solution TAKE 7 ML(700 MG) BY MOUTH TWICE DAILY  . metoprolol tartrate (LOPRESSOR) 25  MG tablet Take 1 tablet (25 mg total) by mouth 2 (two) times daily. Take 1/2 Tablet (12.5mg ) by mouth twice daily (Patient taking differently: Take 12.5 mg by mouth 2 (two) times daily. )  . mycophenolate (CELLCEPT) 500 MG tablet Take 1,000 mg by mouth 2 (two) times daily.   . QUEtiapine (SEROQUEL) 200 MG tablet Take 200 mg by mouth at bedtime.  . [DISCONTINUED] polyethylene glycol (MIRALAX / GLYCOLAX) 17 g packet Take 17 g by mouth daily.     Allergies:   Patient has no known allergies.   Social History   Socioeconomic History  . Marital status: Single    Spouse name: Not on file  .  Number of children: 0  . Years of education: 32  . Highest education level: Not on file  Occupational History    Employer: DISABLED  Tobacco Use  . Smoking status: Never Smoker  . Smokeless tobacco: Never Used  Substance and Sexual Activity  . Alcohol use: No  . Drug use: No  . Sexual activity: Not on file  Other Topics Concern  . Not on file  Social History Narrative  . Not on file   Social Determinants of Health   Financial Resource Strain:   . Difficulty of Paying Living Expenses:   Food Insecurity:   . Worried About Charity fundraiser in the Last Year:   . Arboriculturist in the Last Year:   Transportation Needs:   . Film/video editor (Medical):   Marland Kitchen Lack of Transportation (Non-Medical):   Physical Activity:   . Days of Exercise per Week:   . Minutes of Exercise per Session:   Stress:   . Feeling of Stress :   Social Connections:   . Frequency of Communication with Friends and Family:   . Frequency of Social Gatherings with Friends and Family:   . Attends Religious Services:   . Active Member of Clubs or Organizations:   . Attends Archivist Meetings:   Marland Kitchen Marital Status:      Family History: The patient's family history includes Lung cancer in his paternal grandfather; Other in his father.  ROS:   Please see the history of present illness.     All other systems reviewed and are negative.  EKGs/Labs/Other Studies Reviewed:    The following studies were reviewed today: Coronary angiograms August 2020, nuclear stress test November 2019, echocardiogram September 2019, mobile telemetry October 2019.  EKG:  EKG 05/07/2019 is reviewed and demonstrates sinus rhythm, normal tracing  Recent Labs: 06/06/2019: ALT 28; BUN 15; Creatinine, Ser 1.09; Hemoglobin 15.1; Platelets 208; Potassium 3.8; Sodium 138  Recent Lipid Panel    Component Value Date/Time   TRIG 405 (H) 12/27/2017 0518    Physical Exam:    VS:  BP 129/88   Pulse 91   Temp 98.3 F  (36.8 C)   Resp 11   Ht 6\' 1"  (1.854 m)   Wt 235 lb 12.8 oz (107 kg)   SpO2 100%   BMI 31.11 kg/m     Wt Readings from Last 3 Encounters:  07/22/19 235 lb 12.8 oz (107 kg)  06/06/19 235 lb (106.6 kg)  05/07/19 242 lb (109.8 kg)     GEN: Mildly obese Well nourished, well developed in no acute distress HEENT: Normal NECK: No JVD; No carotid bruits LYMPHATICS: No lymphadenopathy CARDIAC: RRR, no murmurs, rubs, gallops RESPIRATORY:  Clear to auscultation without rales, wheezing or rhonchi  ABDOMEN: Soft, non-tender, non-distended  MUSCULOSKELETAL:  No edema; No deformity  SKIN: Warm and dry NEUROLOGIC:  Alert and oriented x 3 PSYCHIATRIC:  Normal affect   ASSESSMENT:    1. Syncope and collapse    PLAN:    In order of problems listed above:  1. Syncope/near-syncope: The events most closely resemble neurally mediated syncope, but the association with physical exertion is puzzling.  No other clear trigger is identified.  No structural abnormalities on extensive cardiac evaluation including coronary angiography.  Cannot entirely exclude arrhythmia.  I have recommended an implantable loop recorder. This procedure has been fully reviewed with the patient and written informed consent has been obtained.  In the meantime recommended a relatively liberal intake of sodium in his diet, aggressive hydration, avoiding natural diuretic such as tea coffee and alcohol, regular mild-moderate physical exercise.  Most importantly, he should immediately pay he needs to prodromal symptoms and lie down to avoid syncope.    Medication Adjustments/Labs and Tests Ordered: Current medicines are reviewed at length with the patient today.  Concerns regarding medicines are outlined above.  No orders of the defined types were placed in this encounter.  No orders of the defined types were placed in this encounter.   Patient Instructions  Medication Instructions: No changes  * If you need a refill on  your cardiac medications before your next appointment, please call your pharmacy.   Labwork: None ordered If you have labs (blood work) drawn today and your tests are completely normal, you will receive your results only by: [place] (if you have MyChart) OR A paper copy in the mail If you have any lab test that is abnormal or we need to change your treatment, we will call you to review the results.  Testing/Procedures: Your physician has recommended that you have a loop recorder implant. Please follow the instructions below, located under the special instructions section.  Follow-Up: Your physician recommends that you schedule a wound check appointment 10-14 days, after your procedure on 08/13/19, with the device clinic.  Follow-Up: At [facility], you and your health needs are our priority.  As part of our continuing mission to provide you with exceptional heart care, we have created designated Provider Care Teams.  These Care Teams include your primary Cardiologist (physician) and Advanced Practice Providers (APPs -  Physician Assistants and Nurse Practitioners) who all work together to provide you with the care you need, when you need it.  Your next appointment:   12 month(s)  The format for your next appointment:   In Person  Provider:   {Providers/Teams        :[id number]  Special Instructions:   [facility], [address]  [place], [place] [address]  Phone: [phone] Fax: [phone]   You are scheduled for a Loop Recorder Implant on 08/13/19  at 10:30 am with Dr. Sallyanne Kuster. This will take place at [place], [address].    Please arrive at your appointment 15-20 minutes early.    You do not need to be fasting.    The procedure is performed with local anesthesia. You will not receive sedatives nor will an IV be placed.    Wash your chest and neck with the surgical soap the evening before and the morning  of your procedure. Please following the washing instructions provided.    As with all surgical implants, there is a small risk of infection. If an infection occurs, the device will be removed. To  help reduce the risk, please use the surgical scrub provided. Additional antiseptic precautions will be taken at the time of the procedure.    Please bring your insurance cards.   *Please note that scheduled loop recorder implants may need to be rescheduled if Dr. Sallyanne Kuster has a procedure urgently added on at the hospital   Preparing for the Procedure  Before the procedure, you can play an important role. Because skin is not sterile, your skin needs to be as free of germs as possible. You can reduce the number of germs on your skin by washing with CHG (chlorhexidine gluconate) Soap before the procedure. CHG is an antiseptic cleaner which kills germs and bonds with the skin to continue killing germs even after washing.  Please do not use if you have an allergy to CHG or antibacterial soaps. If your skin becomes reddened/irritated, STOP using the CHG.  DO NOT SHAVE (including legs and underarms) for at least 48 hours prior to first CHG shower. It is OK to shave your face.  Please follow these instructions carefully: 1. Shower the night before the procedure and the morning of with CHG Soap. 2. If you chose to wash your hair, wash your hair first as usual with your normal shampoo/conditioner. 3. After you shampoo/condition, rinse you hair and body thoroughly to remove shampoo/conditioner. 4. Use CHG as you would any other liquid soap. You can apply CHG directly to the skin and wash gently with a loofah or a clean washcloth. 5. Apply the CHG Soap to your body ONLY FROM THE NECK DOWN. Do not use on open wounds or open sores. Avoid contact with your eyes, ears, mouth, and genitals (private parts).  6. Wash thoroughly, paying special attention to the area where your surgery will be performed. 7. Thoroughly  rinse your body with warm water from the neck down. 8. DO NOT shower/wash with your normal soap after using and rinsing off the CHG Soap. 9. Pat yourself dry with a clean towel. 10. Wear clean pajamas to bed. 11. Place clean sheets on your bed the night of your first shower and do not sleep with pets..  Day of Surgery: Shower with the CHG Soap following the instructions listed above. DO NOT apply deodorants or lotions.      Signed, Sanda Klein, MD  07/22/2019 12:38 PM    [place]

## 2019-08-13 ENCOUNTER — Ambulatory Visit (INDEPENDENT_AMBULATORY_CARE_PROVIDER_SITE_OTHER): Payer: Medicare Other | Admitting: Cardiovascular Disease

## 2019-08-13 ENCOUNTER — Other Ambulatory Visit: Payer: Self-pay

## 2019-08-13 DIAGNOSIS — R55 Syncope and collapse: Secondary | ICD-10-CM

## 2019-08-13 MED ORDER — LIDOCAINE-EPINEPHRINE 1 %-1:100000 IJ SOLN: 15.0000 mL | Freq: Once | INTRAMUSCULAR | Status: AC

## 2019-08-13 NOTE — Patient Instructions (Addendum)
Discharge Instructions for  Loop Recorder Implant    Follow up: You will need a wound check appointment in 10-14 days. We call you to set this up and the 3 month follow up with Dr. Sallyanne Kuster.   If you have any questions or concerns, please call the office at [phone].  ACTIVITY No restrictions. DO wear your seatbelt, even if it crosses over the site.   WOUND CARE  Keep the wound area clean and dry.  Remove the dressing the day after (usually 24 hours after the procedure).  DO NOT SUBMERGE UNDER WATER UNTIL FULLY HEALED (no tub baths, hot tubs, swimming pools, etc.).   You  may shower or take a sponge bath after the dressing is removed. DO NOT SOAK the area and do not allow the shower to directly spray on the site.  If you have tape/steri-strips on your wound, these will fall off; do not pull them off prematurely.    No bandage is needed on the site.  DO  NOT apply any creams, oils, or ointments to the wound area.  If you notice any drainage or discharge from the wound, any swelling, excessive redness or bruising at the site, or if you develop a fever > 101? F, call the office at once at [phone].

## 2019-08-13 NOTE — Progress Notes (Signed)
LOOP RECORDER IMPLANT   Procedure report  Procedure performed:  Loop recorder implantation   Reason for procedure:  Recurrent syncope/near-syncope  Procedure performed by:  Sanda Klein, MD  Complications:  None  Estimated blood loss:  <5 mL  Medications administered during procedure:  Lidocaine 1% with 1/10,000 epinephrine 10 mL locally Device details:  Medtronic Reveal Linq model number [id number], serial number [id number] Procedure details:  After the risks and benefits of the procedure were discussed the patient provided informed consent. The patient was prepped and draped in usual sterile fashion. Local anesthesia was administered to an area 2 cm to the left of the sternum in the 4th intercostal space. A cutaneous incision was made using the incision tool. The introducer was then used to create a subcutaneous tunnel and carefully deploy the device. Local pressure was held to ensure hemostasis.  The incision was closed with SteriStrips and a sterile dressing was applied.   Sanda Klein, MD, [facility] [phone] office [phone] pager 08/13/2019 10:00 AM

## 2019-08-14 ENCOUNTER — Telehealth: Payer: Self-pay

## 2019-08-14 NOTE — Telephone Encounter (Signed)
The pt and [employer] connected called because the pt states Dr. Sallyanne Kuster gave him a symptom activator to mark his symptoms. He called [employer] to see if his symptoms are being marked. They states the symptom activator is marked grey for him and if we want him to use the activator than we have to mark it on our end. I told them I will let the nurse know and have her give him a call back. They can not let him know if the symptom function is working unless we give access to it.

## 2019-08-14 NOTE — Telephone Encounter (Signed)
Patient has the Medtronic aopp for monitoring, therefore the gray symptom activator does not work for him.  He will need to mark symptoms within the app on his phone.  Attempted to call patient to explain this to him.  No answer.  No DPR on file.  Left VM with DC phone number and hours to call back.

## 2019-08-15 NOTE — Telephone Encounter (Signed)
I let the pt know that the symptom activator does not work because he has the app. I told him he will have to mark his symptoms within the app on his phone. The pt verbalized understanding.

## 2019-08-26 ENCOUNTER — Ambulatory Visit (INDEPENDENT_AMBULATORY_CARE_PROVIDER_SITE_OTHER): Payer: Medicare Other | Admitting: Emergency Medicine

## 2019-08-26 ENCOUNTER — Other Ambulatory Visit: Payer: Self-pay

## 2019-08-26 DIAGNOSIS — R55 Syncope and collapse: Secondary | ICD-10-CM

## 2019-08-26 LAB — CUP PACEART INCLINIC DEVICE CHECK
Date Time Interrogation Session: 20210525101933
Implantable Pulse Generator Implant Date: 20210512

## 2019-08-26 NOTE — Progress Notes (Signed)
ILR wound check in clinic. Steri strips removed. Wound well healed. R- waves 0.42 mV. Mobile app transmitting nightly. No episodes. Questions answered.

## 2019-09-15 ENCOUNTER — Ambulatory Visit (INDEPENDENT_AMBULATORY_CARE_PROVIDER_SITE_OTHER): Payer: Medicare Other | Admitting: *Deleted

## 2019-09-15 DIAGNOSIS — R55 Syncope and collapse: Secondary | ICD-10-CM

## 2019-09-15 LAB — CUP PACEART REMOTE DEVICE CHECK
Date Time Interrogation Session: 20210613114429
Implantable Pulse Generator Implant Date: 20210512

## 2019-09-16 NOTE — Progress Notes (Signed)
Carelink Summary Report / Loop Recorder 

## 2019-09-22 ENCOUNTER — Other Ambulatory Visit: Payer: Self-pay | Admitting: Urology

## 2019-10-07 NOTE — Patient Instructions (Addendum)
DUE TO COVID-19 ONLY ONE VISITOR IS ALLOWED TO COME WITH YOU AND STAY IN THE WAITING ROOM ONLY DURING PRE OP AND PROCEDURE DAY OF SURGERY. THE 1 VISITOR MAY VISIT WITH YOU AFTER SURGERY IN YOUR PRIVATE ROOM DURING VISITING HOURS ONLY!  YOU NEED TO HAVE A COVID 19 TEST ON: 10/11/19 @ 9:30 am , THIS TEST MUST BE DONE BEFORE SURGERY, COME  [place], [address] , [id number].  ([place]) ONCE YOUR COVID TEST IS COMPLETED, PLEASE BEGIN THE QUARANTINE INSTRUCTIONS AS OUTLINED IN YOUR HANDOUT.                Wannetta Sender   Your procedure is scheduled on: 10/15/19   Report to [facility] Main  Entrance   Report to admitting at: 9:00 AM     Call this number if you have problems the morning of surgery [phone]    Remember: Do not eat solid food :After Midnight. Clear liquids from midnight until: 8:00 am    CLEAR LIQUID DIET   Foods Allowed                                                                     Foods Excluded  Coffee and tea, regular and decaf                             liquids that you cannot  Plain Jell-O any favor except red or purple                                           see through such as: Fruit ices (not with fruit pulp)                                     milk, soups, orange juice  Iced Popsicles                                    All solid food Carbonated beverages, regular and diet                                    Cranberry, grape and apple juices Sports drinks like Gatorade Lightly seasoned clear broth or consume(fat free) Sugar, honey syrup  Sample Menu Breakfast                                Lunch                                     Supper Cranberry juice                    Beef broth  Chicken broth Jell-O                                     Grape juice                           Apple juice Coffee or tea                        Jell-O                                      Popsicle                                                 Coffee or tea                        Coffee or tea  _____________________________________________________________________   BRUSH YOUR TEETH MORNING OF SURGERY AND RINSE YOUR MOUTH OUT, NO CHEWING GUM CANDY OR MINTS.     Take these medicines the morning of surgery with A SIP OF WATER: Apremilast(otezla), fluoxetine(prozac),lamotrigine,levetiracetam(kepra),metoprolol,mycophenolate(cellcept)                                 You may not have any metal on your body including hair pins and              piercings  Do not wear jewelry, lotions, powders or perfumes, deodorant             Men may shave face and neck.   Do not bring valuables to the hospital. [place].  Contacts, dentures or bridgework may not be worn into surgery.  Leave suitcase in the car. After surgery it may be brought to your room.     Patients discharged the day of surgery will not be allowed to drive home. IF YOU ARE HAVING SURGERY AND GOING HOME THE SAME DAY, YOU MUST HAVE AN ADULT TO DRIVE YOU HOME AND BE WITH YOU FOR 24 HOURS. YOU MAY GO HOME BY TAXI OR UBER OR ORTHERWISE, BUT AN ADULT MUST ACCOMPANY YOU HOME AND STAY WITH YOU FOR 24 HOURS.  Name and phone number of your driver:  Special Instructions: N/A              Please read over the following fact sheets you were given: _____________________________________________________________________  [facility] - Preparing for Surgery Before surgery, you can play an important role.  Because skin is not sterile, your skin needs to be as free of germs as possible.  You can reduce the number of germs on your skin by washing with CHG (chlorahexidine gluconate) soap before surgery.  CHG is an antiseptic cleaner which kills germs and bonds with the skin to continue killing germs even after washing. Please DO NOT use if you have an allergy to CHG or antibacterial soaps.  If your skin becomes  reddened/irritated stop using the CHG and inform your nurse when  you arrive at Short Stay. Do not shave (including legs and underarms) for at least 48 hours prior to the first CHG shower.  You may shave your face/neck. Please follow these instructions carefully:  1.  Shower with CHG Soap the night before surgery and the  morning of Surgery.  2.  If you choose to wash your hair, wash your hair first as usual with your  normal  shampoo.  3.  After you shampoo, rinse your hair and body thoroughly to remove the  shampoo.                           4.  Use CHG as you would any other liquid soap.  You can apply chg directly  to the skin and wash                       Gently with a scrungie or clean washcloth.  5.  Apply the CHG Soap to your body ONLY FROM THE NECK DOWN.   Do not use on face/ open                           Wound or open sores. Avoid contact with eyes, ears mouth and genitals (private parts).                       Wash face,  Genitals (private parts) with your normal soap.             6.  Wash thoroughly, paying special attention to the area where your surgery  will be performed.  7.  Thoroughly rinse your body with warm water from the neck down.  8.  DO NOT shower/wash with your normal soap after using and rinsing off  the CHG Soap.                9.  Pat yourself dry with a clean towel.            10.  Wear clean pajamas.            11.  Place clean sheets on your bed the night of your first shower and do not  sleep with pets. Day of Surgery : Do not apply any lotions/deodorants the morning of surgery.  Please wear clean clothes to the hospital/surgery center.  FAILURE TO FOLLOW THESE INSTRUCTIONS MAY RESULT IN THE CANCELLATION OF YOUR SURGERY PATIENT SIGNATURE_________________________________  NURSE SIGNATURE__________________________________  ________________________________________________________________________

## 2019-10-07 NOTE — Progress Notes (Signed)
Device order request on 10/07/19.

## 2019-10-08 ENCOUNTER — Encounter: Payer: Self-pay | Admitting: Cardiovascular Disease

## 2019-10-08 ENCOUNTER — Encounter (HOSPITAL_COMMUNITY): Payer: Self-pay

## 2019-10-08 ENCOUNTER — Other Ambulatory Visit: Payer: Self-pay

## 2019-10-08 ENCOUNTER — Encounter (HOSPITAL_COMMUNITY)
Admission: RE | Admit: 2019-10-08 | Discharge: 2019-10-08 | Disposition: A | Discharge status: Home / Self Care | Payer: Medicare Other | Source: Ambulatory Visit | Attending: Urology | Admitting: Urology

## 2019-10-08 DIAGNOSIS — Z01812 Encounter for preprocedural laboratory examination: Secondary | ICD-10-CM | POA: Diagnosis not present

## 2019-10-08 HISTORY — DX: Anxiety disorder, unspecified: F41.9

## 2019-10-08 LAB — CBC
HCT: 44.5 % (ref 39.0–52.0)
Hemoglobin: 14.6 g/dL (ref 13.0–17.0)
MCH: 30.9 pg (ref 26.0–34.0)
MCHC: 32.8 g/dL (ref 30.0–36.0)
MCV: 94.3 fL (ref 80.0–100.0)
Platelets: 211 10*3/uL (ref 150–400)
RBC: 4.72 MIL/uL (ref 4.22–5.81)
RDW: 12.2 % (ref 11.5–15.5)
WBC: 7 10*3/uL (ref 4.0–10.5)
nRBC: 0 % (ref 0.0–0.2)

## 2019-10-08 LAB — BASIC METABOLIC PANEL
Anion gap: 10 (ref 5–15)
BUN: 18 mg/dL (ref 6–20)
CO2: 23 mmol/L (ref 22–32)
Calcium: 9 mg/dL (ref 8.9–10.3)
Chloride: 104 mmol/L (ref 98–111)
Creatinine, Ser: 1.02 mg/dL (ref 0.61–1.24)
GFR calc Af Amer: >60 mL/min (ref >60)
GFR calc non Af Amer: >60 mL/min (ref >60)
Glucose, Bld: 101 mg/dL — ABNORMAL HIGH (ref 70–99)
Potassium: 3.7 mmol/L (ref 3.5–5.1)
Sodium: 137 mmol/L (ref 135–145)

## 2019-10-08 NOTE — Progress Notes (Signed)
PERIOPERATIVE PRESCRIPTION FOR IMPLANTED CARDIAC DEVICE PROGRAMMING  Patient Information: Name:  [name] [name]  DOB:  1980/11/25  MRN:  [id number]    Joline Maxcy, RN  P Burnis Medin Device Phone Number: [phone]  Planned Procedure: Cystoscopy  Surgeon: Dr. Link Snuffer  Date of Procedure: 10/15/19  Cautery will be used.  Position during surgery:    Please send documentation back to:  [facility] (Fax # [phone])   Device Information:  Clinic EP Physician:  Dani Gobble Croitoru   Device Type:LINQ II loop recoder Manufacturer and Phone #:  Medtronic: [id number] Pacemaker Dependent?:  No. Date of Last Device Check:   Normal Device Function?:  Yes.    Electrophysiologist's Recommendations:   Have magnet available.  Provide continuous ECG monitoring when magnet is used or reprogramming is to be performed.   Procedure should not interfere with device function.  No device programming or magnet placement needed.  Per Device Clinic [address], Drake Leach, RN  4:24 PM 10/08/2019

## 2019-10-08 NOTE — Progress Notes (Signed)
COVID Vaccine Completed:yes Date COVID Vaccine completed:4/21 COVID vaccine manufacturer: Pfizer    [employer]   PCP - Dr. Leighton Ruff. Cardiologist - Mihai Croitoru. LOV: 08/13/19  Chest x-ray - 11/07/18 EKG - 05/07/19 Stress Test -  ECHO -  Cardiac C2019ath -   Sleep Study - yes  CPAP - yes  Fasting Blood Sugar -  Checks Blood Sugar _____ times a day  Blood Thinner Instructions: Aspirin Instructions: Last Dose:  Anesthesia review: Hx: HTN,Cardiac arrest,seizures(last on 2017),OSA(CPAP).  Patient denies shortness of breath, fever, cough and chest pain at PAT appointment   Patient verbalized understanding of instructions that were given to them at the PAT appointment. Patient was also instructed that they will need to review over the PAT instructions again at home before surgery.

## 2019-10-10 NOTE — Progress Notes (Signed)
Anesthesia Chart Review   Case: [id number] Date/Time: 10/15/19 1045   Procedure: CYSTOSCOPY WITH URETHRAL DILATATION WITH BALLOON (N/A )   Anesthesia type: General   Pre-op diagnosis: URETHRAL STRICTURE   Location: [facility] ROOM 08 / [facility]   Surgeons: Lucas Mallow, MD      DISCUSSION:39 y.o. never smoker with h/o seizures, HTN, sleep apnea w/CPAP, cardiac arrest 12/2017, Behcet's disease, urethral stricture scheduled for above procedure 10/15/2019 with Dr. Link Snuffer.   In 2019 he had an episode of reported cardiac arrest at a swimming pool, reportedly as he was climbing out of the pool.  A lifeguard apparently performed just 1 or 2 chest compressions, but due to altered mental status he was intubated.  No arrhythmia was detected.  ECG was normal and subsequent cardiac work-up has been negative (normal echo and oncological nuclear stress test, culminating in coronary angiography in August 2020 which shows normal left ventricular systolic function and normal coronary anatomy).  A previous event monitor showed only episodes of sinus tachycardia.  No bradycardia and no ventricular arrhythmia.  Pt last seen by cardiology 07/22/2019.  Per OV note pt has continued to have episodes of unexplained dizziness/near syncope since cardiac arrest 12/2017.  Loop recorder implanted with normal rhythm, no clinically significant events.    Pt last seen by neurologist 05/06/2019.  Seizures well controlled on Lamictal and Keppra.  6 month follow up recommended.   Anticipate pt can proceed with planned procedure barring acute status change.   VS: BP 133/79    Pulse 86    Temp 36.5 C (Oral)    Resp 16    Ht 6' (1.829 m)    Wt 109.3 kg    SpO2 98%    BMI 32.69 kg/m   PROVIDERS: Leighton Ruff, MD is PCP   Croitoru, Dani Gobble, MD is Cardiologist   Margette Fast, MD is Neurologist  LABS: Labs reviewed: Acceptable for surgery. (all labs ordered are listed, but only abnormal results are displayed)  Labs Reviewed   BASIC METABOLIC PANEL - Abnormal; Notable for the following components:      Result Value   Glucose, Bld 101 (*)    All other components within normal limits  CBC     IMAGES:   EKG: 05/07/2019 Rate 81 bpm  NSR  CV: Cardiac Cath 11/29/2018  The left ventricular systolic function is normal.  LV end diastolic pressure is normal.  The left ventricular ejection fraction is 55-65% by visual estimate.   1. Normal coronary anatomy 2. Normal LV function 3. Normal LVEDP  Plan: medical Rx.   Myocardial Perfusion 02/22/2018  Nuclear stress EF: 64%.  The left ventricular ejection fraction is normal (55-65%).  Stress EKG showed no change from baseline EKG showing downsloping ST segment depression in the inferolateral leads  The study is normal.  This is a low risk study.  Echo 12/26/2017 Study Conclusions   - Left ventricle: The cavity size was normal. Wall thickness was  normal. Systolic function was vigorous. The estimated ejection  fraction was in the range of 65% to 70%. Wall motion was normal;  there were no regional wall motion abnormalities. Doppler  parameters are consistent with abnormal left ventricular  relaxation (grade 1 diastolic dysfunction). The E/e&' ratio is  between 8-15, suggesting indeterminate LV filling pressure.  - Mitral valve: Mildly thickened leaflets . There was trivial  regurgitation.  - Left atrium: The atrium was normal in size.  - Inferior vena cava: The vessel was  normal in size. The  respirophasic diameter changes were in the normal range (>= 50%),  consistent with normal central venous pressure.   Impressions:   - LVEF 65-70%, normal wall thickness, normal regional wall motion,  grade 1 DD, indeterminate LV filling pressure, trivial MR, normal  LA size, normal IVC.  Past Medical History:  Diagnosis Date   Anxiety    Behcet's disease ([place])    Bipolar disorder ([place])    Cardiac arrest ([place]) 12/25/2017    one round of CPR    Depression    Hypertension    Migraine    Seizures ([place])    last one 2017   Sleep apnea    CPAP at night   Vitamin D deficiency     Past Surgical History:  Procedure Laterality Date   APPENDECTOMY     FRACTURE SURGERY Right    Arm   LEFT HEART CATH AND CORONARY ANGIOGRAPHY N/A 11/29/2018   Procedure: LEFT HEART CATH AND CORONARY ANGIOGRAPHY;  Surgeon: Martinique, Peter M, MD;  Location: [facility];  Service: Cardiovascular;  Laterality: N/A;   maxilofacial     NISSEN FUNDOPLICATION     RADIOLOGY WITH ANESTHESIA N/A 02/13/2019   Procedure: MRI WITH ANESTHESIA OF BRAIN WITH AND WITHOUT CONTRAST;  Surgeon: Radiologist, Medication, MD;  Location: [facility];  Service: Radiology;  Laterality: N/A;    MEDICATIONS:  Apremilast (OTEZLA) 30 MG TABS   atorvastatin (LIPITOR) 80 MG tablet   Cholecalciferol (DIALYVITE VITAMIN D 5000) 125 MCG (5000 UT) capsule   clonazePAM (KLONOPIN) 0.5 MG tablet   erythromycin ophthalmic ointment   FLUoxetine (PROZAC) 20 MG/5ML solution   lamoTRIgine 200 MG TBDP   levETIRAcetam (KEPPRA) 100 MG/ML solution   metoprolol tartrate (LOPRESSOR) 25 MG tablet   mycophenolate (CELLCEPT) 500 MG tablet   QUEtiapine (SEROQUEL) 200 MG tablet    lidocaine-EPINEPHrine (XYLOCAINE W/EPI) 1 %-1:100000 (with pres) injection 15 mL    Maia Plan [facility] Pre-Surgical Testing [phone] 10/10/19  10:55 AM

## 2019-10-11 ENCOUNTER — Other Ambulatory Visit (HOSPITAL_COMMUNITY)
Admission: RE | Admit: 2019-10-11 | Discharge: 2019-10-11 | Disposition: A | Discharge status: Home / Self Care | Payer: Medicare Other | Source: Ambulatory Visit | Attending: Urology | Admitting: Urology

## 2019-10-11 DIAGNOSIS — Z20822 Contact with and (suspected) exposure to covid-19: Secondary | ICD-10-CM | POA: Diagnosis not present

## 2019-10-11 DIAGNOSIS — Z01812 Encounter for preprocedural laboratory examination: Secondary | ICD-10-CM | POA: Diagnosis present

## 2019-10-11 LAB — SARS CORONAVIRUS 2 (TAT 6-24 HRS): SARS Coronavirus 2: NEGATIVE

## 2019-10-14 NOTE — Anesthesia Preprocedure Evaluation (Addendum)
Anesthesia Evaluation  Patient identified by MRN, date of birth, ID band Patient awake    Reviewed: Allergy & Precautions, NPO status , Patient's Chart, lab work & pertinent test results  Airway Mallampati: II  TM Distance: >3 FB Neck ROM: Full    Dental no notable dental hx. (+) Teeth Intact, Dental Advisory Given   Pulmonary sleep apnea and Continuous Positive Airway Pressure Ventilation ,    Pulmonary exam normal breath sounds clear to auscultation       Cardiovascular hypertension, Pt. on medications negative cardio ROS Normal cardiovascular exam Rhythm:Regular Rate:Normal     Neuro/Psych  Headaches, Seizures -,  Bipolar Disorder    GI/Hepatic negative GI ROS, Neg liver ROS,   Endo/Other  negative endocrine ROS  Renal/GU negative Renal ROS     Musculoskeletal negative musculoskeletal ROS (+)   Abdominal (+) + obese,   Peds  Hematology negative hematology ROS (+)   Anesthesia Other Findings   Reproductive/Obstetrics                            Anesthesia Physical Anesthesia Plan  ASA: II  Anesthesia Plan: General   Post-op Pain Management:    Induction: Intravenous  PONV Risk Score and Plan: 3 and Treatment may vary due to age or medical condition, Ondansetron, Midazolam and Dexamethasone  Airway Management Planned: LMA  Additional Equipment: None  Intra-op Plan:   Post-operative Plan:   Informed Consent: I have reviewed the patients History and Physical, chart, labs and discussed the procedure including the risks, benefits and alternatives for the proposed anesthesia with the patient or authorized representative who has indicated his/her understanding and acceptance.     Dental advisory given  Plan Discussed with:   Anesthesia Plan Comments:        Anesthesia Quick Evaluation

## 2019-10-15 ENCOUNTER — Encounter (HOSPITAL_COMMUNITY)
Admission: RE | Disposition: A | Discharge status: Home / Self Care | Payer: Self-pay | Source: Home / Self Care | Attending: Urology

## 2019-10-15 ENCOUNTER — Ambulatory Visit (HOSPITAL_COMMUNITY)
Admission: RE | Admit: 2019-10-15 | Discharge: 2019-10-15 | Disposition: A | Discharge status: Home / Self Care | Payer: Medicare Other | Attending: Urology | Admitting: Urology

## 2019-10-15 ENCOUNTER — Ambulatory Visit (HOSPITAL_COMMUNITY): Payer: Medicare Other | Admitting: Certified Registered"

## 2019-10-15 ENCOUNTER — Encounter (HOSPITAL_COMMUNITY): Payer: Self-pay | Admitting: Urology

## 2019-10-15 ENCOUNTER — Ambulatory Visit (HOSPITAL_COMMUNITY): Payer: Medicare Other | Admitting: Physician Assistant

## 2019-10-15 DIAGNOSIS — G4730 Sleep apnea, unspecified: Secondary | ICD-10-CM | POA: Insufficient documentation

## 2019-10-15 DIAGNOSIS — Z79899 Other long term (current) drug therapy: Secondary | ICD-10-CM | POA: Insufficient documentation

## 2019-10-15 DIAGNOSIS — F319 Bipolar disorder, unspecified: Secondary | ICD-10-CM | POA: Diagnosis not present

## 2019-10-15 DIAGNOSIS — Z8674 Personal history of sudden cardiac arrest: Secondary | ICD-10-CM | POA: Insufficient documentation

## 2019-10-15 DIAGNOSIS — N35816 Other urethral stricture, male, overlapping sites: Secondary | ICD-10-CM | POA: Diagnosis not present

## 2019-10-15 DIAGNOSIS — I10 Essential (primary) hypertension: Secondary | ICD-10-CM | POA: Insufficient documentation

## 2019-10-15 DIAGNOSIS — F419 Anxiety disorder, unspecified: Secondary | ICD-10-CM | POA: Diagnosis not present

## 2019-10-15 DIAGNOSIS — R569 Unspecified convulsions: Secondary | ICD-10-CM | POA: Insufficient documentation

## 2019-10-15 DIAGNOSIS — N35919 Unspecified urethral stricture, male, unspecified site: Secondary | ICD-10-CM | POA: Diagnosis present

## 2019-10-15 HISTORY — PX: CYSTOSCOPY WITH URETHRAL DILATATION: SHX5125

## 2019-10-15 SURGERY — CYSTOSCOPY, WITH URETHRAL DILATION
Anesthesia: General

## 2019-10-15 MED ORDER — SODIUM CHLORIDE 0.9 % IR SOLN
Status: DC | PRN
  Administered 2019-10-15: 3000 mL

## 2019-10-15 MED ORDER — HYDROCODONE-ACETAMINOPHEN 7.5-325 MG PO TABS: 1.0000 | ORAL_TABLET | Freq: Once | ORAL | Status: DC | PRN

## 2019-10-15 MED ORDER — LACTATED RINGERS IV SOLN: INTRAVENOUS | Status: DC

## 2019-10-15 MED ORDER — PROPOFOL 10 MG/ML IV BOLUS
INTRAVENOUS | Status: AC
  Filled 2019-10-15: qty 20

## 2019-10-15 MED ORDER — MIDAZOLAM HCL 2 MG/2ML IJ SOLN
INTRAMUSCULAR | Status: DC | PRN
  Administered 2019-10-15 (×2): 1 mg via INTRAVENOUS

## 2019-10-15 MED ORDER — PROPOFOL 10 MG/ML IV BOLUS
INTRAVENOUS | Status: DC | PRN
  Administered 2019-10-15: 200 mg via INTRAVENOUS

## 2019-10-15 MED ORDER — FENTANYL CITRATE (PF) 100 MCG/2ML IJ SOLN
INTRAMUSCULAR | Status: AC
  Filled 2019-10-15: qty 2

## 2019-10-15 MED ORDER — LIDOCAINE 2% (20 MG/ML) 5 ML SYRINGE
INTRAMUSCULAR | Status: DC | PRN
  Administered 2019-10-15: 100 mg via INTRAVENOUS

## 2019-10-15 MED ORDER — CEFAZOLIN SODIUM-DEXTROSE 2-4 GM/100ML-% IV SOLN
2.0000 g | INTRAVENOUS | Status: AC
  Administered 2019-10-15: 2 g via INTRAVENOUS
  Filled 2019-10-15: qty 100

## 2019-10-15 MED ORDER — HYDROCODONE-ACETAMINOPHEN 5-325 MG PO TABS: 1.0000 | ORAL_TABLET | ORAL | 0 refills | Status: DC | PRN

## 2019-10-15 MED ORDER — MEPERIDINE HCL 50 MG/ML IJ SOLN: 6.2500 mg | INTRAMUSCULAR | Status: DC | PRN

## 2019-10-15 MED ORDER — FENTANYL CITRATE (PF) 100 MCG/2ML IJ SOLN
INTRAMUSCULAR | Status: DC | PRN
  Administered 2019-10-15 (×2): 25 ug via INTRAVENOUS
  Administered 2019-10-15: 50 ug via INTRAVENOUS

## 2019-10-15 MED ORDER — ONDANSETRON HCL 4 MG/2ML IJ SOLN
INTRAMUSCULAR | Status: AC
  Filled 2019-10-15: qty 2

## 2019-10-15 MED ORDER — ONDANSETRON HCL 4 MG/2ML IJ SOLN
INTRAMUSCULAR | Status: DC | PRN
  Administered 2019-10-15: 4 mg via INTRAVENOUS

## 2019-10-15 MED ORDER — MIDAZOLAM HCL 2 MG/2ML IJ SOLN
INTRAMUSCULAR | Status: AC
  Filled 2019-10-15: qty 2

## 2019-10-15 MED ORDER — LIDOCAINE 2% (20 MG/ML) 5 ML SYRINGE
INTRAMUSCULAR | Status: AC
  Filled 2019-10-15: qty 5

## 2019-10-15 MED ORDER — ACETAMINOPHEN 10 MG/ML IV SOLN: 1000.0000 mg | Freq: Once | INTRAVENOUS | Status: DC | PRN

## 2019-10-15 MED ORDER — DEXAMETHASONE SODIUM PHOSPHATE 10 MG/ML IJ SOLN
INTRAMUSCULAR | Status: AC
  Filled 2019-10-15: qty 1

## 2019-10-15 MED ORDER — DEXAMETHASONE SODIUM PHOSPHATE 10 MG/ML IJ SOLN
INTRAMUSCULAR | Status: DC | PRN
  Administered 2019-10-15: 4 mg via INTRAVENOUS

## 2019-10-15 MED ORDER — PROMETHAZINE HCL 25 MG/ML IJ SOLN: 6.2500 mg | INTRAMUSCULAR | Status: DC | PRN

## 2019-10-15 MED ORDER — HYDROMORPHONE HCL 1 MG/ML IJ SOLN: 0.2500 mg | INTRAMUSCULAR | Status: DC | PRN

## 2019-10-15 MED ORDER — ORAL CARE MOUTH RINSE: 15.0000 mL | Freq: Once | OROMUCOSAL | Status: AC

## 2019-10-15 MED ORDER — CHLORHEXIDINE GLUCONATE 0.12 % MT SOLN
15.0000 mL | Freq: Once | OROMUCOSAL | Status: AC
  Administered 2019-10-15: 15 mL via OROMUCOSAL

## 2019-10-15 SURGICAL SUPPLY — 20 items
BAG DRN RND TRDRP ANRFLXCHMBR (UROLOGICAL SUPPLIES) ×1
BAG URINE DRAIN 2000ML AR STRL (UROLOGICAL SUPPLIES) ×3 IMPLANT
BALLN NEPHROSTOMY (BALLOONS) ×3
BALLOON NEPHROSTOMY (BALLOONS) IMPLANT
CATH FOLEY 2W COUNCIL 20FR 5CC (CATHETERS) ×2 IMPLANT
CATH INTERMIT  6FR 70CM (CATHETERS) IMPLANT
CATH ROBINSON RED A/P 14FR (CATHETERS) ×1 IMPLANT
CATH URET 5FR 28IN CONE TIP (BALLOONS)
CATH URET 5FR 70CM CONE TIP (BALLOONS) IMPLANT
CLOTH BEACON ORANGE TIMEOUT ST (SAFETY) ×3 IMPLANT
GLOVE BIO SURGEON STRL SZ7.5 (GLOVE) ×3 IMPLANT
GOWN STRL REUS W/TWL XL LVL3 (GOWN DISPOSABLE) ×3 IMPLANT
GUIDEWIRE ANG ZIPWIRE 038X150 (WIRE) IMPLANT
GUIDEWIRE STR DUAL SENSOR (WIRE) ×3 IMPLANT
KIT TURNOVER KIT A (KITS) ×2 IMPLANT
MANIFOLD NEPTUNE II (INSTRUMENTS) IMPLANT
NS IRRIG 1000ML POUR BTL (IV SOLUTION) ×2 IMPLANT
PACK CYSTO (CUSTOM PROCEDURE TRAY) ×3 IMPLANT
PENCIL SMOKE EVACUATOR (MISCELLANEOUS) IMPLANT
WATER STERILE IRR 3000ML UROMA (IV SOLUTION) ×3 IMPLANT

## 2019-10-15 NOTE — Anesthesia Postprocedure Evaluation (Signed)
Anesthesia Post Note  Patient: [name] [name]  Procedure(s) Performed: CYSTOSCOPY WITH URETHRAL DILATATION WITH BALLOON (N/A )     Patient location during evaluation: PACU Anesthesia Type: General Level of consciousness: awake and alert Pain management: pain level controlled Vital Signs Assessment: post-procedure vital signs reviewed and stable Respiratory status: spontaneous breathing, nonlabored ventilation, respiratory function stable and patient connected to nasal cannula oxygen Cardiovascular status: blood pressure returned to baseline and stable Postop Assessment: no apparent nausea or vomiting Anesthetic complications: no   No complications documented.  Last Vitals:  Vitals:   10/15/19 1300 10/15/19 1319  BP: 116/62 98/82  Pulse: 73 91  Resp: 14 16  Temp: 36.5 C 36.4 C  SpO2: 97% 96%    Last Pain:  Vitals:   10/15/19 1319  TempSrc:   PainSc: 2                  Barnet Glasgow

## 2019-10-15 NOTE — Transfer of Care (Signed)
Immediate Anesthesia Transfer of Care Note  Patient: [name] [name]  Procedure(s) Performed: CYSTOSCOPY WITH URETHRAL DILATATION WITH BALLOON (N/A )  Patient Location: PACU  Anesthesia Type:General  Level of Consciousness: drowsy  Airway & Oxygen Therapy: Patient Spontanous Breathing and Patient connected to face mask oxygen  Post-op Assessment: Report given to RN and Post -op Vital signs reviewed and stable  Post vital signs: Reviewed and stable  Last Vitals:  Vitals Value Taken Time  BP 119/84 10/15/19 1157  Temp    Pulse 83 10/15/19 1158  Resp 14 10/15/19 1158  SpO2 100 % 10/15/19 1158  Vitals shown include unvalidated device data.  Last Pain:  Vitals:   10/15/19 0926  TempSrc:   PainSc: 3          Complications: No complications documented.

## 2019-10-15 NOTE — Anesthesia Procedure Notes (Signed)
Procedure Name: LMA Insertion Date/Time: 10/15/2019 11:21 AM Performed by: Eben Burow, CRNA Pre-anesthesia Checklist: Patient identified, Emergency Drugs available, Suction available, Patient being monitored and Timeout performed Patient Re-evaluated:Patient Re-evaluated prior to induction Oxygen Delivery Method: Circle system utilized Preoxygenation: Pre-oxygenation with 100% oxygen Induction Type: IV induction Ventilation: Mask ventilation without difficulty LMA: LMA inserted and LMA with gastric port inserted LMA Size: 4.0 Number of attempts: 1 Tube secured with: Tape Dental Injury: Teeth and Oropharynx as per pre-operative assessment

## 2019-10-15 NOTE — Op Note (Signed)
Operative Note  Preoperative diagnosis:  1.  Urethral stricture  Postoperative diagnosis: 1.  Bulbar and penile urethral stricture  Procedure(s): 1.  Cystoscopy with balloon dilation of urethral stricture  Surgeon: Link Snuffer, MD  Assistants: None  Anesthesia: General  Complications: None immediate  EBL: Minimal  Specimens: 1.  None  Drains/Catheters: 1.  60 [place] council tip catheter  Intraoperative findings: After dilation, cystoscopy revealed multiple annular strictures along the penile and bulbar urethra that were short.  These were dilated up to 24 [place] with a balloon dilator.  Indication: 39 year old male with voiding complaints found to have multiple annular urethral strictures presents for the previously mentioned operation  Description of procedure:  The patient was identified and consent was obtained.  The patient was taken to the operating room and placed in the supine position.  The patient was placed under general anesthesia.  Perioperative antibiotics were administered.  The patient was placed in dorsal lithotomy.  Patient was prepped and draped in a standard sterile fashion and a timeout was performed.  A wire was advanced into the urethra and into the bladder.  I then advanced a urethral balloon dilator over the wire past the bulbar urethra and inflated the balloon dilator to a pressure of 18.  This dilated it to 77 [place].  I held this for about 2 minutes and then released the balloon and pulled the balloon just beyond the urethral meatus and again dilated up to a pressure of 18.  After 2 minutes, I released this and withdrew the balloon.  I advanced a 21 French rigid cystoscope alongside the wire and into the bladder.  There were no abnormal findings within the bladder.  See urethral findings above.  I withdrew the scope.  I passed a council tip catheter over the wire and placed 10 cc into the catheter balloon.  This include the operation.  Patient tolerated  the procedure well and was stable postoperatively.  Plan: Return in 1 week for catheter removal

## 2019-10-15 NOTE — H&P (Signed)
H&P  Chief Complaint: Urethral stricture  History of Present Illness: 39 year old male with a urethral stricture desires urethral dilation  Past Medical History:  Diagnosis Date  . Anxiety   . Behcet's disease ([place])   . Bipolar disorder ([place])   . Cardiac arrest ([place]) 12/25/2017   one round of CPR   . Depression   . Hypertension   . Migraine   . Seizures ([place])    last one 2017  . Sleep apnea    CPAP at night  . Vitamin D deficiency    Past Surgical History:  Procedure Laterality Date  . APPENDECTOMY    . FRACTURE SURGERY Right    Arm  . LEFT HEART CATH AND CORONARY ANGIOGRAPHY N/A 11/29/2018   Procedure: LEFT HEART CATH AND CORONARY ANGIOGRAPHY;  Surgeon: Martinique, Peter M, MD;  Location: [facility];  Service: Cardiovascular;  Laterality: N/A;  . maxilofacial    . NISSEN FUNDOPLICATION    . RADIOLOGY WITH ANESTHESIA N/A 02/13/2019   Procedure: MRI WITH ANESTHESIA OF BRAIN WITH AND WITHOUT CONTRAST;  Surgeon: Radiologist, Medication, MD;  Location: [facility];  Service: Radiology;  Laterality: N/A;    Home Medications:  Facility-Administered Medications Prior to Admission  Medication Dose Route Frequency Provider Last Rate Last Admin  . lidocaine-EPINEPHrine (XYLOCAINE W/EPI) 1 %-1:100000 (with pres) injection 15 mL  15 mL Infiltration Once Croitoru, Mihai, MD       Medications Prior to Admission  Medication Sig Dispense Refill Last Dose  . Apremilast (OTEZLA) 30 MG TABS Take 30 mg by mouth daily.    10/14/2019 at Unknown time  . atorvastatin (LIPITOR) 80 MG tablet Take 80 mg by mouth daily.   10/14/2019 at Unknown time  . Cholecalciferol (DIALYVITE VITAMIN D 5000) 125 MCG (5000 UT) capsule Take 5,000 Units by mouth daily.   10/14/2019 at Unknown time  . clonazePAM (KLONOPIN) 0.5 MG tablet Take 0.25 mg by mouth 3 (three) times daily.    10/14/2019 at Unknown time  . erythromycin ophthalmic ointment Place 1 application into the right eye in the morning and at bedtime.    10/14/2019 at Unknown time  . FLUoxetine (PROZAC) 20 MG/5ML solution Take 80 mg by mouth daily.    10/14/2019 at Unknown time  . lamoTRIgine 200 MG TBDP DISSOLVE 1 TABLET BY MOUTH TWICE DAILY(MORNING AND BEDTIME) (Patient taking differently: Take 200 mg by mouth in the morning and at bedtime. ) 180 tablet 3 10/15/2019 at Unknown time  . levETIRAcetam (KEPPRA) 100 MG/ML solution TAKE 7 ML(700 MG) BY MOUTH TWICE DAILY (Patient taking differently: Take 700 mg by mouth in the morning and at bedtime. ) 420 mL 3 10/15/2019 at Unknown time  . metoprolol tartrate (LOPRESSOR) 25 MG tablet Take 1 tablet (25 mg total) by mouth 2 (two) times daily. Take 1/2 Tablet (12.5mg ) by mouth twice daily (Patient taking differently: Take 12.5 mg by mouth 2 (two) times daily. ) 90 tablet 3 10/15/2019 at 0800  . mycophenolate (CELLCEPT) 500 MG tablet Take 500-1,000 mg by mouth See admin instructions. Take 1000 mg in the morning and 500 mg at night  2 10/14/2019 at Unknown time  . QUEtiapine (SEROQUEL) 200 MG tablet Take 200 mg by mouth at bedtime.   10/14/2019 at Unknown time   Allergies: No Known Allergies  Family History  Problem Relation Age of Onset  . Lung cancer Paternal Grandfather   . Other Father        BPPV   Social History:  reports that he has never smoked. He has never used smokeless tobacco. He reports that he does not drink alcohol and does not use drugs.  ROS: A complete review of systems was performed.  All systems are negative except for pertinent findings as noted. ROS   Physical Exam:  Vital signs in last 24 hours: Temp:  [97.6 F (36.4 C)] 97.6 F (36.4 C) (07/14 0914) Pulse Rate:  [101] 101 (07/14 0914) Resp:  [16] 16 (07/14 0914) BP: (139)/(84) 139/84 (07/14 0914) SpO2:  [100 %] 100 % (07/14 0914) Weight:  [106.6 kg] 106.6 kg (07/14 0914) General:  Alert and oriented, No acute distress HEENT: Normocephalic, atraumatic Neck: No JVD or lymphadenopathy Cardiovascular: Regular rate and  rhythm Lungs: Regular rate and effort Abdomen: Soft, nontender, nondistended, no abdominal masses Back: No CVA tenderness Extremities: No edema Neurologic: Grossly intact  Laboratory Data:  No results found for this or any previous visit (from the past 24 hour(s)). Recent Results (from the past 240 hour(s))  SARS CORONAVIRUS 2 (TAT 6-24 HRS) Nasopharyngeal Nasopharyngeal Swab     Status: None   Collection Time: 10/11/19 10:00 AM   Specimen: Nasopharyngeal Swab  Result Value Ref Range Status   SARS Coronavirus 2 NEGATIVE NEGATIVE Final    Comment: (NOTE) SARS-CoV-2 target nucleic acids are NOT DETECTED.  The SARS-CoV-2 RNA is generally detectable in upper and lower respiratory specimens during the acute phase of infection. Negative results do not preclude SARS-CoV-2 infection, do not rule out co-infections with other pathogens, and should not be used as the sole basis for treatment or other patient management decisions. Negative results must be combined with clinical observations, patient history, and epidemiological information. The expected result is Negative.  Fact Sheet for Patients: SugarRoll.be  Fact Sheet for Healthcare Providers: https://www.woods-mathews.com/  This test is not yet approved or cleared by the [place] FDA and  has been authorized for detection and/or diagnosis of SARS-CoV-2 by FDA under an Emergency Use Authorization (EUA). This EUA will remain  in effect (meaning this test can be used) for the duration of the COVID-19 declaration under Se ction 564(b)(1) of the Act, 21 U.S.C. section 360bbb-3(b)(1), unless the authorization is terminated or revoked sooner.  Performed at [facility], [place] [address], [place], [place] [address]    Creatinine: Recent Labs    10/08/19 1317  CREATININE 1.02    Impression/Assessment:  Urethral stricture  Plan:  Proceed with cystoscopy with urethral  dilation.  Marton Redwood, III 10/15/2019, 11:06 AM

## 2019-10-15 NOTE — Discharge Instructions (Addendum)
He will go home with Foley catheter.  This will be removed in about a week.  You may notice some blood in the urine.  This is okay as long as the catheter is draining well.

## 2019-10-16 ENCOUNTER — Encounter (HOSPITAL_COMMUNITY): Payer: Self-pay | Admitting: Urology

## 2019-10-20 ENCOUNTER — Ambulatory Visit (INDEPENDENT_AMBULATORY_CARE_PROVIDER_SITE_OTHER): Payer: Medicare Other | Admitting: *Deleted

## 2019-10-20 DIAGNOSIS — R55 Syncope and collapse: Secondary | ICD-10-CM | POA: Diagnosis not present

## 2019-10-20 LAB — CUP PACEART REMOTE DEVICE CHECK
Date Time Interrogation Session: 20210718230440
Implantable Pulse Generator Implant Date: 20210512

## 2019-10-22 NOTE — Progress Notes (Signed)
Carelink Summary Report / Loop Recorder 

## 2019-11-06 ENCOUNTER — Other Ambulatory Visit: Payer: Self-pay | Admitting: Medical

## 2019-11-07 NOTE — Telephone Encounter (Signed)
This is Dr. Croitoru's pt 

## 2019-11-09 ENCOUNTER — Other Ambulatory Visit: Payer: Self-pay | Admitting: Neurology

## 2019-11-17 ENCOUNTER — Ambulatory Visit (INDEPENDENT_AMBULATORY_CARE_PROVIDER_SITE_OTHER): Payer: Medicare Other | Admitting: Neurology

## 2019-11-17 ENCOUNTER — Encounter: Payer: Self-pay | Admitting: Neurology

## 2019-11-17 VITALS — BP 122/80 | HR 106 | Ht 72.0 in | Wt 237.0 lb

## 2019-11-17 DIAGNOSIS — M352 Behcet's disease: Secondary | ICD-10-CM

## 2019-11-17 DIAGNOSIS — G40909 Epilepsy, unspecified, not intractable, without status epilepticus: Secondary | ICD-10-CM

## 2019-11-17 NOTE — Progress Notes (Signed)
Reason for visit: Behcet's disease, seizures  [name] [name] is an 40 y.o. male  History of present illness:  [name] [name] is a 39 year old right-handed white male with a history of Behcet's disease and a history of seizures.  He has done quite well since last seen, he has not had any recurrent seizures, he remains on lamotrigine and Keppra.  He had blood work done when last seen with therapeutic levels of these medications.  The patient has not had any further seizures, he reports no decline in his cognitive functioning.  He has a loop recorder in place currently, he has a prior history of cardiac arrest.  He has sleep apnea, uses CPAP at night, he indicates that he sleeps well, he takes Seroquel at night.  He has developed a hyperpigmented scaly rash around the groin area over the last month, he has never had this type of rash before, he continues to have the Behcet's skin lesions that occur on the arms and legs, and in the groin area and on the tongue and gums.  He returns for further evaluation.  Past Medical History:  Diagnosis Date  . Anxiety   . Behcet's disease ([place])   . Bipolar disorder ([place])   . Cardiac arrest ([place]) 12/25/2017   one round of CPR   . Depression   . Hypertension   . Migraine   . Seizures ([place])    last one 2017  . Sleep apnea    CPAP at night  . Vitamin D deficiency     Past Surgical History:  Procedure Laterality Date  . APPENDECTOMY    . CYSTOSCOPY WITH URETHRAL DILATATION N/A 10/15/2019   Procedure: CYSTOSCOPY WITH URETHRAL DILATATION WITH BALLOON;  Surgeon: Lucas Mallow, MD;  [facility];  Service: Urology;  Laterality: N/A;  . FRACTURE SURGERY Right    Arm  . LEFT HEART CATH AND CORONARY ANGIOGRAPHY N/A 11/29/2018   Procedure: LEFT HEART CATH AND CORONARY ANGIOGRAPHY;  Surgeon: Martinique, Peter M, MD;  Location: [facility];  Service: Cardiovascular;  Laterality: N/A;  . maxilofacial    . NISSEN FUNDOPLICATION    . RADIOLOGY WITH ANESTHESIA  N/A 02/13/2019   Procedure: MRI WITH ANESTHESIA OF BRAIN WITH AND WITHOUT CONTRAST;  Surgeon: Radiologist, Medication, MD;  Location: [facility];  Service: Radiology;  Laterality: N/A;    Family History  Problem Relation Age of Onset  . Lung cancer Paternal Grandfather   . Other Father        BPPV    Social history:  reports that he has never smoked. He has never used smokeless tobacco. He reports that he does not drink alcohol and does not use drugs.   No Known Allergies  Medications:  Prior to Admission medications   Medication Sig Start Date End Date Taking? Authorizing Provider  Apremilast (OTEZLA) 30 MG TABS Take 30 mg by mouth daily.     [provider]  atorvastatin (LIPITOR) 80 MG tablet TAKE 1 TABLET(80 MG) BY MOUTH DAILY 11/07/19   Croitoru, Mihai, MD  Cholecalciferol (DIALYVITE VITAMIN D 5000) 125 MCG (5000 UT) capsule Take 5,000 Units by mouth daily.    [provider]  clonazePAM (KLONOPIN) 0.5 MG tablet Take 0.25 mg by mouth 3 (three) times daily.     [provider]  erythromycin ophthalmic ointment Place 1 application into the right eye in the morning and at bedtime. 09/19/19   [provider]  FLUoxetine (PROZAC) 20 MG/5ML  solution Take 80 mg by mouth daily.  11/27/16   [provider]  HYDROcodone-acetaminophen (NORCO) 5-325 MG tablet Take 1 tablet by mouth every 4 (four) hours as needed for moderate pain. 10/15/19   Lucas Mallow, MD  lamoTRIgine 200 MG TBDP DISSOLVE 1 TABLET BY MOUTH TWICE DAILY(MORNING AND BEDTIME) Patient taking differently: Take 200 mg by mouth in the morning and at bedtime.  07/22/19   Kathrynn Ducking, MD  levETIRAcetam (KEPPRA) 100 MG/ML solution Take 7 mLs (700 mg total) by mouth in the morning and at bedtime. 11/10/19   Kathrynn Ducking, MD  metoprolol tartrate (LOPRESSOR) 25 MG tablet Take 1 tablet (25 mg total) by mouth 2 (two) times daily. Take 1/2 Tablet (12.5mg ) by mouth twice daily Patient taking  differently: Take 12.5 mg by mouth 2 (two) times daily.  03/31/19   Kroeger, Lorelee Cover., PA-C  mycophenolate (CELLCEPT) 500 MG tablet Take 500-1,000 mg by mouth See admin instructions. Take 1000 mg in the morning and 500 mg at night 03/20/17   [provider]  QUEtiapine (SEROQUEL) 200 MG tablet Take 200 mg by mouth at bedtime.    [provider]    ROS:  Out of a complete 14 system review of symptoms, the patient complains only of the following symptoms, and all other reviewed systems are negative.  Skin rash Exercise intolerance  Blood pressure 122/80, pulse (!) 106, height 6' (1.829 m), weight 237 lb (107.5 kg), SpO2 98 %.  Physical Exam  General: The patient is alert and cooperative at the time of the examination.  The patient is moderately obese.  Skin: No significant peripheral edema is noted.   Neurologic Exam  Mental status: The patient is alert and oriented x 3 at the time of the examination. The patient has apparent normal recent and remote memory, with an apparently normal attention span and concentration ability.   Cranial nerves: Facial symmetry is present. Speech is normal, no aphasia or dysarthria is noted. Extraocular movements are full. Visual fields are full.  Motor: The patient has good strength in all 4 extremities.  Sensory examination: Soft touch sensation is symmetric on the face, arms, and legs.  Coordination: The patient has good finger-nose-finger and heel-to-shin bilaterally.  Gait and station: The patient has a normal gait. Tandem gait is normal. Romberg is negative. No drift is seen.  Reflexes: Deep tendon reflexes are symmetric.   Assessment/Plan:  1.  History of seizures  2.  Behcet's disease  3.  Skin rash  4.  Sleep apnea on CPAP  The patient will continue his current medications with the Keppra and lamotrigine.  He will follow-up in 6 months.  He has done well with seizure control recently.  Jill Alexanders  MD 11/17/2019 9:33 AM  [facility] [address] [place], [place] [address]  Phone [phone] Fax [phone]

## 2019-11-24 ENCOUNTER — Other Ambulatory Visit: Payer: Self-pay

## 2019-11-24 ENCOUNTER — Ambulatory Visit (INDEPENDENT_AMBULATORY_CARE_PROVIDER_SITE_OTHER): Payer: Medicare Other | Admitting: *Deleted

## 2019-11-24 ENCOUNTER — Encounter: Payer: Self-pay | Admitting: Cardiovascular Disease

## 2019-11-24 ENCOUNTER — Ambulatory Visit (INDEPENDENT_AMBULATORY_CARE_PROVIDER_SITE_OTHER): Payer: Medicare Other | Admitting: Cardiovascular Disease

## 2019-11-24 VITALS — BP 111/74 | HR 80 | Ht 72.0 in | Wt 236.0 lb

## 2019-11-24 DIAGNOSIS — E782 Mixed hyperlipidemia: Secondary | ICD-10-CM | POA: Diagnosis not present

## 2019-11-24 DIAGNOSIS — Z4509 Encounter for adjustment and management of other cardiac device: Secondary | ICD-10-CM

## 2019-11-24 DIAGNOSIS — R55 Syncope and collapse: Secondary | ICD-10-CM

## 2019-11-24 LAB — CUP PACEART REMOTE DEVICE CHECK
Date Time Interrogation Session: 20210820230102
Implantable Pulse Generator Implant Date: 20210512

## 2019-11-24 NOTE — Progress Notes (Signed)
Cardiology Office Note:    Date:  11/24/2019   ID:  BRAN [name], DOB 05-04-1980, MRN [id number]  PCP:  Leighton Ruff, MD  Cardiologist:  Sanda Klein, MD  Electrophysiologist:  None   Referring MD: Leighton Ruff, MD   No chief complaint on file.   History of Present Illness:    [name] [name] is a 39 y.o. male with a hx of Behcet sd.,  Bipolar disorder, seizures, migraine headaches, obstructive sleep apnea CPAP, with a reported cardiac arrest event in September 2019.  Since then he continues to have episodes of unexplained dizziness/near syncope.  He relates the episodes of near syncope to physical activity.  If he walks too fast or exerts himself (for example pushing a lawnmower) too much he becomes flushed, diaphoretic and lightheaded.  If he does not quickly sit down he feels he will pass out.  He believes that these episodes more commonly occur when he is having an outbreak of Behcet's syndrome and is "inflamed all over".  He does not have nausea or vomiting or focal neurological events or palpitations during the episodes of near syncope.  In 2019 he had an episode of reported cardiac arrest at a swimming pool, reportedly as he was climbing out of the pool.  A lifeguard apparently performed just 1 or 2 chest compressions, but due to altered mental status he was intubated.  No arrhythmia was detected.  ECG was normal and subsequent cardiac work-up has been negative (normal echo and oncological nuclear stress test, culminating in coronary angiography in August 2020 which shows normal left ventricular systolic function and normal coronary anatomy).  A previous event monitor showed only episodes of sinus tachycardia.  No bradycardia and no ventricular arrhythmia.  Past Medical History:  Diagnosis Date  . Anxiety   . Behcet's disease ([place])   . Bipolar disorder ([place])   . Cardiac arrest ([place]) 12/25/2017   one round of CPR   . Depression   . Hypertension   . Migraine   . Seizures  ([place])    last one 2017  . Sleep apnea    CPAP at night  . Vitamin D deficiency     Past Surgical History:  Procedure Laterality Date  . APPENDECTOMY    . CYSTOSCOPY WITH URETHRAL DILATATION N/A 10/15/2019   Procedure: CYSTOSCOPY WITH URETHRAL DILATATION WITH BALLOON;  Surgeon: Lucas Mallow, MD;  Location: [facility];  Service: Urology;  Laterality: N/A;  . FRACTURE SURGERY Right    Arm  . LEFT HEART CATH AND CORONARY ANGIOGRAPHY N/A 11/29/2018   Procedure: LEFT HEART CATH AND CORONARY ANGIOGRAPHY;  Surgeon: Martinique, Peter M, MD;  Location: [facility];  Service: Cardiovascular;  Laterality: N/A;  . maxilofacial    . NISSEN FUNDOPLICATION    . RADIOLOGY WITH ANESTHESIA N/A 02/13/2019   Procedure: MRI WITH ANESTHESIA OF BRAIN WITH AND WITHOUT CONTRAST;  Surgeon: Radiologist, Medication, MD;  Location: [facility];  Service: Radiology;  Laterality: N/A;    Current Medications: Current Meds  Medication Sig  . Apremilast (OTEZLA) 30 MG TABS Take 30 mg by mouth daily.   Marland Kitchen atorvastatin (LIPITOR) 80 MG tablet TAKE 1 TABLET(80 MG) BY MOUTH DAILY  . clonazePAM (KLONOPIN) 0.5 MG tablet Take 0.25 mg by mouth 3 (three) times daily.   Marland Kitchen FLUoxetine (PROZAC) 20 MG/5ML solution Take 80 mg by mouth daily.   Marland Kitchen lamoTRIgine 200 MG TBDP DISSOLVE 1 TABLET BY MOUTH TWICE DAILY(MORNING AND BEDTIME) (Patient taking differently: Take  200 mg by mouth in the morning and at bedtime. )  . levETIRAcetam (KEPPRA) 100 MG/ML solution Take 7 mLs (700 mg total) by mouth in the morning and at bedtime.  . metoprolol tartrate (LOPRESSOR) 25 MG tablet Take 1 tablet (25 mg total) by mouth 2 (two) times daily. Take 1/2 Tablet (12.5mg ) by mouth twice daily (Patient taking differently: Take 12.5 mg by mouth 2 (two) times daily. )  . mycophenolate (CELLCEPT) 500 MG tablet Take 500-1,000 mg by mouth See admin instructions. Take 1000 mg in the morning and 500 mg at night  . QUEtiapine (SEROQUEL) 200 MG tablet Take 200 mg by mouth  at bedtime.  Marland Kitchen VITAMIN D PO Take by mouth.   Current Facility-Administered Medications for the 11/24/19 encounter (Office Visit) with Sanda Klein, MD  Medication  . lidocaine-EPINEPHrine (XYLOCAINE W/EPI) 1 %-1:100000 (with pres) injection 15 mL     Allergies:   Patient has no known allergies.   Social History   Socioeconomic History  . Marital status: Single    Spouse name: Not on file  . Number of children: 0  . Years of education: 81  . Highest education level: Not on file  Occupational History    Employer: DISABLED  Tobacco Use  . Smoking status: Never Smoker  . Smokeless tobacco: Never Used  Vaping Use  . Vaping Use: Never used  Substance and Sexual Activity  . Alcohol use: No  . Drug use: No  . Sexual activity: Not on file  Other Topics Concern  . Not on file  Social History Narrative  . Not on file   Social Determinants of Health   Financial Resource Strain:   . Difficulty of Paying Living Expenses: Not on file  Food Insecurity:   . Worried About Charity fundraiser in the Last Year: Not on file  . Ran Out of Food in the Last Year: Not on file  Transportation Needs:   . Lack of Transportation (Medical): Not on file  . Lack of Transportation (Non-Medical): Not on file  Physical Activity:   . Days of Exercise per Week: Not on file  . Minutes of Exercise per Session: Not on file  Stress:   . Feeling of Stress : Not on file  Social Connections:   . Frequency of Communication with Friends and Family: Not on file  . Frequency of Social Gatherings with Friends and Family: Not on file  . Attends Religious Services: Not on file  . Active Member of Clubs or Organizations: Not on file  . Attends Archivist Meetings: Not on file  . Marital Status: Not on file     Family History: The patient's family history includes Lung cancer in his paternal grandfather; Other in his father.  ROS:   Please see the history of present illness.     All other  systems reviewed and are negative.  EKGs/Labs/Other Studies Reviewed:    The following studies were reviewed today: Coronary angiograms August 2020, nuclear stress test November 2019, echocardiogram September 2019, mobile telemetry October 2019.  EKG:  EKG 05/07/2019 is reviewed and demonstrates sinus rhythm, normal tracing  Recent Labs: 06/06/2019: ALT 28 10/08/2019: BUN 18; Creatinine, Ser 1.02; Hemoglobin 14.6; Platelets 211; Potassium 3.7; Sodium 137  Recent Lipid Panel    Component Value Date/Time   TRIG 405 (H) 12/27/2017 0518    Physical Exam:    VS:  BP 111/74   Pulse 80   Ht 6' (1.829 m)  Wt 236 lb (107 kg)   SpO2 98%   BMI 32.01 kg/m     Wt Readings from Last 3 Encounters:  11/24/19 236 lb (107 kg)  11/17/19 237 lb (107.5 kg)  10/15/19 235 lb (106.6 kg)     GEN: Mildly obese Well nourished, well developed in no acute distress HEENT: Normal NECK: No JVD; No carotid bruits LYMPHATICS: No lymphadenopathy CARDIAC: RRR, no murmurs, rubs, gallops RESPIRATORY:  Clear to auscultation without rales, wheezing or rhonchi  ABDOMEN: Soft, non-tender, non-distended MUSCULOSKELETAL:  No edema; No deformity  SKIN: Warm and dry NEUROLOGIC:  Alert and oriented x 3 PSYCHIATRIC:  Normal affect   ASSESSMENT:    1. Mixed hyperlipidemia   2. Syncope and collapse    PLAN:    In order of problems listed above:  1. Syncope/near-syncope: The events most closely resemble neurally mediated syncope, but the association with physical exertion is puzzling.  No other clear trigger is identified.  No structural abnormalities on extensive cardiac evaluation including coronary angiography.  Cannot entirely exclude arrhythmia.  I have recommended an implantable loop recorder. This procedure has been fully reviewed with the patient and written informed consent has been obtained.  In the meantime recommended a relatively liberal intake of sodium in his diet, aggressive hydration, avoiding  natural diuretic such as tea coffee and alcohol, regular mild-moderate physical exercise.  Most importantly, he should immediately pay he needs to prodromal symptoms and lie down to avoid syncope.    Medication Adjustments/Labs and Tests Ordered: Current medicines are reviewed at length with the patient today.  Concerns regarding medicines are outlined above.  Orders Placed This Encounter  Procedures  . CT CARDIAC SCORING  . EKG 12-Lead   No orders of the defined types were placed in this encounter.   Patient Instructions  Medication Instructions:  NO CHANGES *If you need a refill on your cardiac medications before your next appointment, please call your pharmacy*   Lab Work: None ordered If you have labs (blood work) drawn today and your tests are completely normal, you will receive your results only by: Marland Kitchen MyChart Message (if you have MyChart) OR . A paper copy in the mail If you have any lab test that is abnormal or we need to change your treatment, we will call you to review the results.   Testing/Procedures: Dr. Sallyanne Kuster has ordered a CT coronary calcium score. This test is done at [address]. This is $150 out of pocket.   Coronary CalciumScan A coronary calcium scan is an imaging test used to look for deposits of calcium and other fatty materials (plaques) in the inner lining of the blood vessels of the heart (coronary arteries). These deposits of calcium and plaques can partly clog and narrow the coronary arteries without producing any symptoms or warning signs. This puts a person at risk for a heart attack. This test can detect these deposits before symptoms develop. Tell a health care provider about:  Any allergies you have.  All medicines you are taking, including vitamins, herbs, eye drops, creams, and over-the-counter medicines.  Any problems you or family members have had with anesthetic medicines.  Any blood disorders you have.  Any  surgeries you have had.  Any medical conditions you have.  Whether you are pregnant or may be pregnant. What are the risks? Generally, this is a safe procedure. However, problems may occur, including:  Harm to a pregnant woman and her unborn baby. This test involves  the use of radiation. Radiation exposure can be dangerous to a pregnant woman and her unborn baby. If you are pregnant, you generally should not have this procedure done.  Slight increase in the risk of cancer. This is because of the radiation involved in the test. What happens before the procedure? No preparation is needed for this procedure. What happens during the procedure?  You will undress and remove any jewelry around your neck or chest.  You will put on a hospital gown.  Sticky electrodes will be placed on your chest. The electrodes will be connected to an electrocardiogram (ECG) machine to record a tracing of the electrical activity of your heart.  A CT scanner will take pictures of your heart. During this time, you will be asked to lie still and hold your breath for 2-3 seconds while a picture of your heart is being taken. The procedure may vary among health care providers and hospitals. What happens after the procedure?  You can get dressed.  You can return to your normal activities.  It is up to you to get the results of your test. Ask your health care provider, or the department that is doing the test, when your results will be ready. Summary  A coronary calcium scan is an imaging test used to look for deposits of calcium and other fatty materials (plaques) in the inner lining of the blood vessels of the heart (coronary arteries).  Generally, this is a safe procedure. Tell your health care provider if you are pregnant or may be pregnant.  No preparation is needed for this procedure.  A CT scanner will take pictures of your heart.  You can return to your normal activities after the scan is done. This  information is not intended to replace advice given to you by your health care provider. Make sure you discuss any questions you have with your health care provider. Document Released: 09/16/2007 Document Revised: 02/07/2016 Document Reviewed: 02/07/2016 Elsevier Interactive Patient Education  2017 [place]: At [facility], you and your health needs are our priority.  As part of our continuing mission to provide you with exceptional heart care, we have created designated Provider Care Teams.  These Care Teams include your primary Cardiologist (physician) and Advanced Practice Providers (APPs -  Physician Assistants and Nurse Practitioners) who all work together to provide you with the care you need, when you need it.  We recommend signing up for the patient portal called "MyChart".  Sign up information is provided on this After Visit Summary.  MyChart is used to connect with patients for Virtual Visits (Telemedicine).  Patients are able to view lab/test results, encounter notes, upcoming appointments, etc.  Non-urgent messages can be sent to your provider as well.   To learn more about what you can do with MyChart, go to [email or web address].    Your next appointment:   12 month(s)  The format for your next appointment:   In Person  Provider:   You may see Sanda Klein, MD or one of the following Advanced Practice Providers on your designated Care Team:    Almyra Deforest, PA-C  Fabian Sharp, [place] or   [name], PA-C       Signed, Sanda Klein, MD  11/24/2019 8:36 AM    [place]

## 2019-11-24 NOTE — Patient Instructions (Signed)
Medication Instructions:  NO CHANGES *If you need a refill on your cardiac medications before your next appointment, please call your pharmacy*   Lab Work: None ordered If you have labs (blood work) drawn today and your tests are completely normal, you will receive your results only by: Marland Kitchen MyChart Message (if you have MyChart) OR . A paper copy in the mail If you have any lab test that is abnormal or we need to change your treatment, we will call you to review the results.   Testing/Procedures: Dr. Sallyanne Kuster has ordered a CT coronary calcium score. This test is done at [address]. This is $150 out of pocket.   Coronary CalciumScan A coronary calcium scan is an imaging test used to look for deposits of calcium and other fatty materials (plaques) in the inner lining of the blood vessels of the heart (coronary arteries). These deposits of calcium and plaques can partly clog and narrow the coronary arteries without producing any symptoms or warning signs. This puts a person at risk for a heart attack. This test can detect these deposits before symptoms develop. Tell a health care provider about:  Any allergies you have.  All medicines you are taking, including vitamins, herbs, eye drops, creams, and over-the-counter medicines.  Any problems you or family members have had with anesthetic medicines.  Any blood disorders you have.  Any surgeries you have had.  Any medical conditions you have.  Whether you are pregnant or may be pregnant. What are the risks? Generally, this is a safe procedure. However, problems may occur, including:  Harm to a pregnant woman and her unborn baby. This test involves the use of radiation. Radiation exposure can be dangerous to a pregnant woman and her unborn baby. If you are pregnant, you generally should not have this procedure done.  Slight increase in the risk of cancer. This is because of the radiation involved in the test. What  happens before the procedure? No preparation is needed for this procedure. What happens during the procedure?  You will undress and remove any jewelry around your neck or chest.  You will put on a hospital gown.  Sticky electrodes will be placed on your chest. The electrodes will be connected to an electrocardiogram (ECG) machine to record a tracing of the electrical activity of your heart.  A CT scanner will take pictures of your heart. During this time, you will be asked to lie still and hold your breath for 2-3 seconds while a picture of your heart is being taken. The procedure may vary among health care providers and hospitals. What happens after the procedure?  You can get dressed.  You can return to your normal activities.  It is up to you to get the results of your test. Ask your health care provider, or the department that is doing the test, when your results will be ready. Summary  A coronary calcium scan is an imaging test used to look for deposits of calcium and other fatty materials (plaques) in the inner lining of the blood vessels of the heart (coronary arteries).  Generally, this is a safe procedure. Tell your health care provider if you are pregnant or may be pregnant.  No preparation is needed for this procedure.  A CT scanner will take pictures of your heart.  You can return to your normal activities after the scan is done. This information is not intended to replace advice given to you by your health care  provider. Make sure you discuss any questions you have with your health care provider. Document Released: 09/16/2007 Document Revised: 02/07/2016 Document Reviewed: 02/07/2016 Elsevier Interactive Patient Education  2017 [place]: At [facility], you and your health needs are our priority.  As part of our continuing mission to provide you with exceptional heart care, we have created designated Provider Care Teams.  These Care Teams  include your primary Cardiologist (physician) and Advanced Practice Providers (APPs -  Physician Assistants and Nurse Practitioners) who all work together to provide you with the care you need, when you need it.  We recommend signing up for the patient portal called "MyChart".  Sign up information is provided on this After Visit Summary.  MyChart is used to connect with patients for Virtual Visits (Telemedicine).  Patients are able to view lab/test results, encounter notes, upcoming appointments, etc.  Non-urgent messages can be sent to your provider as well.   To learn more about what you can do with MyChart, go to [email or web address].    Your next appointment:   12 month(s)  The format for your next appointment:   In Person  Provider:   You may see Sanda Klein, MD or one of the following Advanced Practice Providers on your designated Care Team:    Almyra Deforest, PA-C  Fabian Sharp, PA-C or   [name], [place]

## 2019-11-25 ENCOUNTER — Encounter: Payer: Self-pay | Admitting: Cardiovascular Disease

## 2019-11-25 NOTE — Progress Notes (Signed)
Cardiology Office Note:    Date:  11/25/2019   ID:  [name] [name], DOB 03/29/1981, MRN [id number]  PCP:  Leighton Ruff, MD  Cardiologist:  Sanda Klein, MD  Electrophysiologist:  None   Referring MD: Leighton Ruff, MD   Chief Complaint  Patient presents with  . Loss of Consciousness    History of Present Illness:    [name] [name] is a 39 y.o. male with a hx of Behcet sd.,  Bipolar disorder, seizures, migraine headaches, obstructive sleep apnea CPAP, with a reported cardiac arrest event in September 2019.  Since then he continues to have episodes of unexplained dizziness/near syncope.  He relates the episodes of near syncope to physical activity.  If he walks too fast or exerts himself (for example pushing a lawnmower) too much he becomes flushed, diaphoretic and lightheaded.  If he does not quickly sit down he feels he will pass out.  He believes that these episodes more commonly occur when he is having an outbreak of Behcet's syndrome and is "inflamed all over".  He does not have nausea or vomiting or focal neurological events or palpitations during the episodes of near syncope.  He had a loop recorder implanted on Aug 13, 2019 and he has not had any episodes of syncope since.  On one occasion, during physical exertion he felt rather weak and sat down.  He did not experience near syncope or syncope.  His device has not recorded any arrhythmia to date.  Additional problem includes mixed hyperlipidemia: Labs performed in June 2021 showed total cholesterol 248, HDL 42, LDL 167, triglycerides 208 (on atorvastatin 80 mg daily, reportedly compliant).  He is unable to exercise regularly because he believes this causes flares of his Behcet's.  If he tries to [facility], he will "pay for it" by developing oral ulcers, skin lesions and on one occasion even had a urethral ulcer that was quite difficult to deal with.  In 2019 he had an episode of reported cardiac arrest at a swimming pool,  reportedly as he was climbing out of the pool.  A lifeguard apparently performed just 1 or 2 chest compressions, but due to altered mental status he was intubated.  No arrhythmia was detected.  ECG was normal and subsequent cardiac work-up has been negative (normal echo and oncological nuclear stress test, culminating in coronary angiography in August 2020 which shows normal left ventricular systolic function and normal coronary anatomy).  A previous event monitor showed only episodes of sinus tachycardia.  No bradycardia and no ventricular arrhythmia.  Past Medical History:  Diagnosis Date  . Anxiety   . Behcet's disease ([place])   . Bipolar disorder ([place])   . Cardiac arrest ([place]) 12/25/2017   one round of CPR   . Depression   . Hypertension   . Migraine   . Seizures ([place])    last one 2017  . Sleep apnea    CPAP at night  . Vitamin D deficiency     Past Surgical History:  Procedure Laterality Date  . APPENDECTOMY    . CYSTOSCOPY WITH URETHRAL DILATATION N/A 10/15/2019   Procedure: CYSTOSCOPY WITH URETHRAL DILATATION WITH BALLOON;  Surgeon: Lucas Mallow, MD;  Location: [facility];  Service: Urology;  Laterality: N/A;  . FRACTURE SURGERY Right    Arm  . LEFT HEART CATH AND CORONARY ANGIOGRAPHY N/A 11/29/2018   Procedure: LEFT HEART CATH AND CORONARY ANGIOGRAPHY;  Surgeon: Martinique, Peter M, MD;  Location: [facility];  Service: Cardiovascular;  Laterality: N/A;  . maxilofacial    . NISSEN FUNDOPLICATION    . RADIOLOGY WITH ANESTHESIA N/A 02/13/2019   Procedure: MRI WITH ANESTHESIA OF BRAIN WITH AND WITHOUT CONTRAST;  Surgeon: Radiologist, Medication, MD;  Location: [facility];  Service: Radiology;  Laterality: N/A;    Current Medications: Current Meds  Medication Sig  . Apremilast (OTEZLA) 30 MG TABS Take 30 mg by mouth daily.   Marland Kitchen atorvastatin (LIPITOR) 80 MG tablet TAKE 1 TABLET(80 MG) BY MOUTH DAILY  . clonazePAM (KLONOPIN) 0.5 MG tablet Take 0.25 mg by mouth 3 (three) times  daily.   Marland Kitchen FLUoxetine (PROZAC) 20 MG/5ML solution Take 80 mg by mouth daily.   Marland Kitchen lamoTRIgine 200 MG TBDP DISSOLVE 1 TABLET BY MOUTH TWICE DAILY(MORNING AND BEDTIME) (Patient taking differently: Take 200 mg by mouth in the morning and at bedtime. )  . levETIRAcetam (KEPPRA) 100 MG/ML solution Take 7 mLs (700 mg total) by mouth in the morning and at bedtime.  . metoprolol tartrate (LOPRESSOR) 25 MG tablet Take 1 tablet (25 mg total) by mouth 2 (two) times daily. Take 1/2 Tablet (12.5mg ) by mouth twice daily (Patient taking differently: Take 12.5 mg by mouth 2 (two) times daily. )  . mycophenolate (CELLCEPT) 500 MG tablet Take 500-1,000 mg by mouth See admin instructions. Take 1000 mg in the morning and 500 mg at night  . QUEtiapine (SEROQUEL) 200 MG tablet Take 200 mg by mouth at bedtime.  Marland Kitchen VITAMIN D PO Take by mouth.   Current Facility-Administered Medications for the 11/24/19 encounter (Office Visit) with Sanda Klein, MD  Medication  . lidocaine-EPINEPHrine (XYLOCAINE W/EPI) 1 %-1:100000 (with pres) injection 15 mL     Allergies:   Patient has no known allergies.   Social History   Socioeconomic History  . Marital status: Single    Spouse name: Not on file  . Number of children: 0  . Years of education: 58  . Highest education level: Not on file  Occupational History    Employer: DISABLED  Tobacco Use  . Smoking status: Never Smoker  . Smokeless tobacco: Never Used  Vaping Use  . Vaping Use: Never used  Substance and Sexual Activity  . Alcohol use: No  . Drug use: No  . Sexual activity: Not on file  Other Topics Concern  . Not on file  Social History Narrative  . Not on file   Social Determinants of Health   Financial Resource Strain:   . Difficulty of Paying Living Expenses: Not on file  Food Insecurity:   . Worried About Charity fundraiser in the Last Year: Not on file  . Ran Out of Food in the Last Year: Not on file  Transportation Needs:   . Lack of  Transportation (Medical): Not on file  . Lack of Transportation (Non-Medical): Not on file  Physical Activity:   . Days of Exercise per Week: Not on file  . Minutes of Exercise per Session: Not on file  Stress:   . Feeling of Stress : Not on file  Social Connections:   . Frequency of Communication with Friends and Family: Not on file  . Frequency of Social Gatherings with Friends and Family: Not on file  . Attends Religious Services: Not on file  . Active Member of Clubs or Organizations: Not on file  . Attends Archivist Meetings: Not on file  . Marital Status: Not on file     Family History: The patient's  family history includes Lung cancer in his paternal grandfather; Other in his father.  ROS:   Please see the history of present illness.    All other systems are reviewed and are negative.   EKGs/Labs/Other Studies Reviewed:    The following studies were reviewed today: Coronary angiograms August 2020, nuclear stress test November 2019, echocardiogram September 2019, mobile telemetry October 2019.  EKG:  EKG ordered today shows normal sinus rhythm and is a normal tracing with borderline QTC 456 ms  Recent Labs: 06/06/2019: ALT 28 10/08/2019: BUN 18; Creatinine, Ser 1.02; Hemoglobin 14.6; Platelets 211; Potassium 3.7; Sodium 137  Recent Lipid Panel    Component Value Date/Time   TRIG 405 (H) 12/27/2017 0518  09/09/2019 Total cholesterol 248, HDL 42, LDL 167, triglycerides 208 TSH 1.39  Physical Exam:    VS:  BP 111/74   Pulse 80   Ht 6' (1.829 m)   Wt 236 lb (107 kg)   SpO2 98%   BMI 32.01 kg/m     Wt Readings from Last 3 Encounters:  11/24/19 236 lb (107 kg)  11/17/19 237 lb (107.5 kg)  10/15/19 235 lb (106.6 kg)      General: Alert, oriented x3, no distress, mildly obese.  Well-healed loop recorder site. Head: no evidence of trauma, PERRL, EOMI, no exophtalmos or lid lag, no myxedema, no xanthelasma; normal ears, nose and oropharynx Neck: normal  jugular venous pulsations and no hepatojugular reflux; brisk carotid pulses without delay and no carotid bruits Chest: clear to auscultation, no signs of consolidation by percussion or palpation, normal fremitus, symmetrical and full respiratory excursions Cardiovascular: normal position and quality of the apical impulse, regular rhythm, normal first and second heart sounds, no murmurs, rubs or gallops Abdomen: no tenderness or distention, no masses by palpation, no abnormal pulsatility or arterial bruits, normal bowel sounds, no hepatosplenomegaly Extremities: no clubbing, cyanosis or edema; 2+ radial, ulnar and brachial pulses bilaterally; 2+ right femoral, posterior tibial and dorsalis pedis pulses; 2+ left femoral, posterior tibial and dorsalis pedis pulses; no subclavian or femoral bruits Neurological: grossly nonfocal Psych: Normal mood and affect   ASSESSMENT:    1. Syncope and collapse   2. Mixed hyperlipidemia   3. Encounter for loop recorder check    PLAN:    In order of problems listed above:  1. Syncope/near-syncope: The events most closely resemble neurally mediated syncope, but the association with physical exertion is puzzling.  No evidence of structural heart disease by echocardiography and coronary angiography.  Reminded him to stay very well-hydrated and to eat a relatively liberal amount of sodium in his diet.  He has normal blood pressure.  To date his loop recorder has not identified any significant arrhythmia. 2. ILR: Site well-healed.  Continue monthly downloads. 3. HLP: He has a very high LDL cholesterol despite maximum dose atorvastatin.  This is concerning.  Suggested a calcium score to decide on how aggressive future therapy should be.  If his calcium score is high I would push to wards the use of PCSK9 inhibitors on top of the statin.  Another option would be to add ezetimibe but this would be insufficient to bring his LDL cholesterol to usual target ranges.  If his  calcium score is 0, may decide to simply continue the current treatment.    Medication Adjustments/Labs and Tests Ordered: Current medicines are reviewed at length with the patient today.  Concerns regarding medicines are outlined above.  Orders Placed This Encounter  Procedures  . CT  CARDIAC SCORING  . EKG 12-Lead   No orders of the defined types were placed in this encounter.   Patient Instructions  Medication Instructions:  NO CHANGES *If you need a refill on your cardiac medications before your next appointment, please call your pharmacy*   Lab Work: None ordered If you have labs (blood work) drawn today and your tests are completely normal, you will receive your results only by: Marland Kitchen MyChart Message (if you have MyChart) OR . A paper copy in the mail If you have any lab test that is abnormal or we need to change your treatment, we will call you to review the results.   Testing/Procedures: Dr. Sallyanne Kuster has ordered a CT coronary calcium score. This test is done at [address]. This is $150 out of pocket.   Coronary CalciumScan A coronary calcium scan is an imaging test used to look for deposits of calcium and other fatty materials (plaques) in the inner lining of the blood vessels of the heart (coronary arteries). These deposits of calcium and plaques can partly clog and narrow the coronary arteries without producing any symptoms or warning signs. This puts a person at risk for a heart attack. This test can detect these deposits before symptoms develop. Tell a health care provider about:  Any allergies you have.  All medicines you are taking, including vitamins, herbs, eye drops, creams, and over-the-counter medicines.  Any problems you or family members have had with anesthetic medicines.  Any blood disorders you have.  Any surgeries you have had.  Any medical conditions you have.  Whether you are pregnant or may be pregnant. What are the  risks? Generally, this is a safe procedure. However, problems may occur, including:  Harm to a pregnant woman and her unborn baby. This test involves the use of radiation. Radiation exposure can be dangerous to a pregnant woman and her unborn baby. If you are pregnant, you generally should not have this procedure done.  Slight increase in the risk of cancer. This is because of the radiation involved in the test. What happens before the procedure? No preparation is needed for this procedure. What happens during the procedure?  You will undress and remove any jewelry around your neck or chest.  You will put on a hospital gown.  Sticky electrodes will be placed on your chest. The electrodes will be connected to an electrocardiogram (ECG) machine to record a tracing of the electrical activity of your heart.  A CT scanner will take pictures of your heart. During this time, you will be asked to lie still and hold your breath for 2-3 seconds while a picture of your heart is being taken. The procedure may vary among health care providers and hospitals. What happens after the procedure?  You can get dressed.  You can return to your normal activities.  It is up to you to get the results of your test. Ask your health care provider, or the department that is doing the test, when your results will be ready. Summary  A coronary calcium scan is an imaging test used to look for deposits of calcium and other fatty materials (plaques) in the inner lining of the blood vessels of the heart (coronary arteries).  Generally, this is a safe procedure. Tell your health care provider if you are pregnant or may be pregnant.  No preparation is needed for this procedure.  A CT scanner will take pictures of your heart.  You can return  to your normal activities after the scan is done. This information is not intended to replace advice given to you by your health care provider. Make sure you discuss any  questions you have with your health care provider. Document Released: 09/16/2007 Document Revised: 02/07/2016 Document Reviewed: 02/07/2016 Elsevier Interactive Patient Education  2017 [place]: At [facility], you and your health needs are our priority.  As part of our continuing mission to provide you with exceptional heart care, we have created designated Provider Care Teams.  These Care Teams include your primary Cardiologist (physician) and Advanced Practice Providers (APPs -  Physician Assistants and Nurse Practitioners) who all work together to provide you with the care you need, when you need it.  We recommend signing up for the patient portal called "MyChart".  Sign up information is provided on this After Visit Summary.  MyChart is used to connect with patients for Virtual Visits (Telemedicine).  Patients are able to view lab/test results, encounter notes, upcoming appointments, etc.  Non-urgent messages can be sent to your provider as well.   To learn more about what you can do with MyChart, go to [email or web address].    Your next appointment:   12 month(s)  The format for your next appointment:   In Person  Provider:   You may see Sanda Klein, MD or one of the following Advanced Practice Providers on your designated Care Team:    Almyra Deforest, PA-C  Fabian Sharp, [place] or   [name], PA-C       Signed, Sanda Klein, MD  11/25/2019 5:24 PM    [place]

## 2019-11-27 NOTE — Progress Notes (Signed)
Carelink Summary Report / Loop Recorder 

## 2019-12-04 ENCOUNTER — Other Ambulatory Visit: Payer: Self-pay

## 2019-12-04 ENCOUNTER — Ambulatory Visit (INDEPENDENT_AMBULATORY_CARE_PROVIDER_SITE_OTHER)
Admission: RE | Admit: 2019-12-04 | Discharge: 2019-12-04 | Disposition: A | Discharge status: Home / Self Care | Payer: Self-pay | Source: Ambulatory Visit | Attending: Cardiovascular Disease | Admitting: Cardiovascular Disease

## 2019-12-04 DIAGNOSIS — R55 Syncope and collapse: Secondary | ICD-10-CM

## 2019-12-04 DIAGNOSIS — E782 Mixed hyperlipidemia: Secondary | ICD-10-CM

## 2019-12-29 ENCOUNTER — Ambulatory Visit (INDEPENDENT_AMBULATORY_CARE_PROVIDER_SITE_OTHER): Payer: Medicare Other | Admitting: Emergency Medicine

## 2019-12-29 DIAGNOSIS — R55 Syncope and collapse: Secondary | ICD-10-CM | POA: Diagnosis not present

## 2019-12-29 LAB — CUP PACEART REMOTE DEVICE CHECK
Date Time Interrogation Session: 20210922230454
Implantable Pulse Generator Implant Date: 20210512

## 2019-12-31 NOTE — Progress Notes (Signed)
Carelink Summary Report / Loop Recorder 

## 2020-01-31 LAB — CUP PACEART REMOTE DEVICE CHECK
Date Time Interrogation Session: 20211025230606
Implantable Pulse Generator Implant Date: 20210512

## 2020-02-02 ENCOUNTER — Ambulatory Visit (INDEPENDENT_AMBULATORY_CARE_PROVIDER_SITE_OTHER): Payer: Medicare Other

## 2020-02-02 DIAGNOSIS — R55 Syncope and collapse: Secondary | ICD-10-CM | POA: Diagnosis not present

## 2020-02-04 NOTE — Progress Notes (Signed)
Carelink Summary Report / Loop Recorder 

## 2020-02-24 ENCOUNTER — Other Ambulatory Visit: Payer: Self-pay | Admitting: Medical

## 2020-03-05 LAB — CUP PACEART REMOTE DEVICE CHECK
Date Time Interrogation Session: 20211127230445
Implantable Pulse Generator Implant Date: 20210512

## 2020-03-08 ENCOUNTER — Ambulatory Visit (INDEPENDENT_AMBULATORY_CARE_PROVIDER_SITE_OTHER): Payer: Medicare Other

## 2020-03-08 DIAGNOSIS — R55 Syncope and collapse: Secondary | ICD-10-CM | POA: Diagnosis not present

## 2020-03-17 NOTE — Progress Notes (Signed)
Carelink Summary Report / Loop Recorder 

## 2020-04-12 ENCOUNTER — Ambulatory Visit (INDEPENDENT_AMBULATORY_CARE_PROVIDER_SITE_OTHER): Payer: Medicare Other

## 2020-04-12 DIAGNOSIS — R55 Syncope and collapse: Secondary | ICD-10-CM | POA: Diagnosis not present

## 2020-04-12 LAB — CUP PACEART REMOTE DEVICE CHECK
Date Time Interrogation Session: 20220108230434
Implantable Pulse Generator Implant Date: 20210512

## 2020-04-26 NOTE — Progress Notes (Signed)
Carelink Summary Report / Loop Recorder 

## 2020-04-28 ENCOUNTER — Other Ambulatory Visit: Payer: Self-pay | Admitting: Neurology

## 2020-05-15 LAB — CUP PACEART REMOTE DEVICE CHECK
Date Time Interrogation Session: 20220210230113
Implantable Pulse Generator Implant Date: 20210512

## 2020-05-17 ENCOUNTER — Ambulatory Visit (INDEPENDENT_AMBULATORY_CARE_PROVIDER_SITE_OTHER): Payer: Medicare Other

## 2020-05-17 DIAGNOSIS — R55 Syncope and collapse: Secondary | ICD-10-CM | POA: Diagnosis not present

## 2020-05-20 ENCOUNTER — Encounter: Payer: Self-pay | Admitting: Neurology

## 2020-05-20 ENCOUNTER — Ambulatory Visit (INDEPENDENT_AMBULATORY_CARE_PROVIDER_SITE_OTHER): Payer: Medicare Other | Admitting: Neurology

## 2020-05-20 VITALS — BP 139/92 | HR 93 | Ht 72.0 in | Wt 229.6 lb

## 2020-05-20 DIAGNOSIS — R569 Unspecified convulsions: Secondary | ICD-10-CM | POA: Diagnosis not present

## 2020-05-20 DIAGNOSIS — Z5181 Encounter for therapeutic drug level monitoring: Secondary | ICD-10-CM

## 2020-05-20 DIAGNOSIS — M352 Behcet's disease: Secondary | ICD-10-CM | POA: Diagnosis not present

## 2020-05-20 MED ORDER — LEVETIRACETAM 750 MG PO TABS
750.0000 mg | ORAL_TABLET | Freq: Two times a day (BID) | ORAL | 3 refills | Status: DC
Start: 2020-05-20 — End: 2020-10-25

## 2020-05-20 MED ORDER — MODAFINIL 200 MG PO TABS
200.0000 mg | ORAL_TABLET | Freq: Every day | ORAL | 2 refills | Status: DC
Start: 2020-05-20 — End: 2020-08-26

## 2020-05-20 MED ORDER — LAMOTRIGINE 200 MG PO TBDP: 200.0000 mg | ORAL_TABLET | Freq: Two times a day (BID) | ORAL | 3 refills | Status: DC

## 2020-05-20 NOTE — Patient Instructions (Signed)
Take Provigil 200 mg in the morning for the fatigue.

## 2020-05-20 NOTE — Progress Notes (Signed)
Reason for visit: Patient has disease, seizures  [name] [name] is an 40 y.o. 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  He has a history of seizures, he has not had any recent seizures since last seen.  The patient unfortunately had the Covid infection despite being fully vaccinated and boosted 5 weeks ago.  He has had a lot of cognitive impairment and increased fatigue since that time.  The patient had some worsening of his rashes from Behcet's while he was sick with a virus.  He is on immunosuppressant agents for his Behcet's disease.  The patient is on CPAP for sleep apnea as well.  He returns the office today for further evaluation.  Past Medical History:  Diagnosis Date  . Anxiety   . Behcet's disease ([place])   . Bipolar disorder ([place])   . Cardiac arrest ([place]) 12/25/2017   one round of CPR   . Depression   . Hypertension   . Migraine   . Seizures ([place])    last one 2017  . Sleep apnea    CPAP at night  . Vitamin D deficiency     Past Surgical History:  Procedure Laterality Date  . APPENDECTOMY    . CYSTOSCOPY WITH URETHRAL DILATATION N/A 10/15/2019   Procedure: CYSTOSCOPY WITH URETHRAL DILATATION WITH BALLOON;  Surgeon: Lucas Mallow, MD;  [facility];  Service: Urology;  Laterality: N/A;  . FRACTURE SURGERY Right    Arm  . LEFT HEART CATH AND CORONARY ANGIOGRAPHY N/A 11/29/2018   Procedure: LEFT HEART CATH AND CORONARY ANGIOGRAPHY;  Surgeon: Martinique, Peter M, MD;  Location: [facility];  Service: Cardiovascular;  Laterality: N/A;  . maxilofacial    . NISSEN FUNDOPLICATION    . RADIOLOGY WITH ANESTHESIA N/A 02/13/2019   Procedure: MRI WITH ANESTHESIA OF BRAIN WITH AND WITHOUT CONTRAST;  Surgeon: Radiologist, Medication, MD;  Location: [facility];  Service: Radiology;  Laterality: N/A;    Family History  Problem Relation Age of Onset   . Lung cancer Paternal Grandfather   . Other Father        BPPV    Social history:  reports that he has never smoked. He has never used smokeless tobacco. He reports that he does not drink alcohol and does not use drugs.   No Known Allergies  Medications:  Prior to Admission medications   Medication Sig Start Date End Date Taking? Authorizing Provider  Apremilast 30 MG TABS Take 30 mg by mouth daily.    Yes [provider]  atorvastatin (LIPITOR) 80 MG tablet TAKE 1 TABLET(80 MG) BY MOUTH DAILY 11/07/19  Yes Croitoru, Mihai, MD  clonazePAM (KLONOPIN) 0.5 MG tablet Take 0.25 mg by mouth 3 (three) times daily.   Yes [provider]  FLUoxetine (PROZAC) 20 MG tablet Take 20 mg by mouth 2 (two) times daily. 11/27/16  Yes [provider]  lamoTRIgine 200 MG TBDP DISSOLVE 1 TABLET BY MOUTH TWICE DAILY(MORNING AND BEDTIME) Patient taking differently: Take 200 mg by mouth in the morning and at bedtime. 07/22/19  Yes Kathrynn Ducking, MD  levETIRAcetam (KEPPRA) 100 MG/ML solution TAKE 7 ML(700 MG) BY MOUTH IN THE MORNING AND AT BEDTIME 04/28/20  Yes Kathrynn Ducking, MD  metoprolol tartrate (LOPRESSOR) 25 MG tablet TAKE 1/2 TABLET BY MOUTH TWICE DAILY. 02/24/20  Yes Croitoru, Dani Gobble, MD  mycophenolate (CELLCEPT) 500 MG tablet Take 500-1,000 mg by mouth See admin instructions. Take 1000 mg in the morning and 500 mg at night 03/20/17  Yes [provider]  QUEtiapine (SEROQUEL) 200 MG tablet Take 200 mg by mouth at bedtime.   Yes [provider]  VITAMIN D PO Take by mouth.   Yes [provider]    ROS:  Out of a complete 14 system review of symptoms, the patient complains only of the following symptoms, and all other reviewed systems are negative.  Fatigue Decreased cognitive function Skin rash  Blood pressure (!) 139/92, pulse 93, height 6' (1.829 m), weight 229 lb 9.6 oz (104.1 kg).  Physical Exam  General: The patient is alert and  cooperative at the time of the examination.  Skin: No significant peripheral edema is noted.   Neurologic Exam  Mental status: The patient is alert and oriented x 3 at the time of the examination. The patient has apparent normal recent and remote memory, with an apparently normal attention span and concentration ability.   Cranial nerves: Facial symmetry is present. Speech is normal, no aphasia or dysarthria is noted. Extraocular movements are full. Visual fields are full.  Motor: The patient has good strength in all 4 extremities.  Sensory examination: Soft touch sensation is symmetric on the face, arms, and legs.  Coordination: The patient has good finger-nose-finger and heel-to-shin bilaterally.  Gait and station: The patient has a normal gait. Tandem gait is slightly unsteady. Romberg is negative. No drift is seen.  Reflexes: Deep tendon reflexes are symmetric.   Assessment/Plan:  1.  Behcet's disease  2.  History of seizures  The patient will be given a prescription for lamotrigine, will switch his Keppra from liquid to tablets taking 750 mg twice daily.  The patient will have blood work done today.  He will be given a prescription for Provigil for the fatigue taking 200 mg in the morning.  He will follow up here in 6 months.  Jill Alexanders MD 05/20/2020 8:39 AM  [facility] [address] [place], [place] [address]  Phone [phone] Fax [phone]

## 2020-05-20 NOTE — Progress Notes (Signed)
Carelink Summary Report / Loop Recorder 

## 2020-05-26 LAB — COMPREHENSIVE METABOLIC PANEL
ALT: 20 IU/L (ref 0–44)
AST: 15 IU/L (ref 0–40)
Albumin/Globulin Ratio: 2 (ref 1.2–2.2)
Albumin: 4.6 g/dL (ref 4.0–5.0)
Alkaline Phosphatase: 110 IU/L (ref 44–121)
BUN/Creatinine Ratio: 11 (ref 9–20)
BUN: 11 mg/dL (ref 6–24)
Bilirubin Total: 0.3 mg/dL (ref 0.0–1.2)
CO2: 20 mmol/L (ref 20–29)
Calcium: 9.2 mg/dL (ref 8.7–10.2)
Chloride: 106 mmol/L (ref 96–106)
Creatinine, Ser: 1.02 mg/dL (ref 0.76–1.27)
GFR calc Af Amer: 105 mL/min/{1.73_m2} (ref >59)
GFR calc non Af Amer: 91 mL/min/{1.73_m2} (ref >59)
Globulin, Total: 2.3 g/dL (ref 1.5–4.5)
Glucose: 94 mg/dL (ref 65–99)
Potassium: 4 mmol/L (ref 3.5–5.2)
Sodium: 143 mmol/L (ref 134–144)
Total Protein: 6.9 g/dL (ref 6.0–8.5)

## 2020-05-26 LAB — CBC WITH DIFFERENTIAL/PLATELET
Basophils Absolute: 0 10*3/uL (ref 0.0–0.2)
Basos: 1 %
EOS (ABSOLUTE): 0.2 10*3/uL (ref 0.0–0.4)
Eos: 4 %
Hematocrit: 43 % (ref 37.5–51.0)
Hemoglobin: 14.6 g/dL (ref 13.0–17.7)
Immature Grans (Abs): 0 10*3/uL (ref 0.0–0.1)
Immature Granulocytes: 0 %
Lymphocytes Absolute: 1.2 10*3/uL (ref 0.7–3.1)
Lymphs: 19 %
MCH: 31.2 pg (ref 26.6–33.0)
MCHC: 34 g/dL (ref 31.5–35.7)
MCV: 92 fL (ref 79–97)
Monocytes Absolute: 0.6 10*3/uL (ref 0.1–0.9)
Monocytes: 10 %
Neutrophils Absolute: 4 10*3/uL (ref 1.4–7.0)
Neutrophils: 66 %
Platelets: 185 10*3/uL (ref 150–450)
RBC: 4.68 x10E6/uL (ref 4.14–5.80)
RDW: 12.6 % (ref 11.6–15.4)
WBC: 6 10*3/uL (ref 3.4–10.8)

## 2020-05-26 LAB — LAMOTRIGINE LEVEL: Lamotrigine Lvl: 6.8 ug/mL (ref 2.0–20.0)

## 2020-05-26 LAB — LEVETIRACETAM LEVEL: Levetiracetam Lvl: 34.7 ug/mL (ref 10.0–40.0)

## 2020-06-17 LAB — CUP PACEART REMOTE DEVICE CHECK
Date Time Interrogation Session: 20220315230215
Implantable Pulse Generator Implant Date: 20210512

## 2020-06-21 ENCOUNTER — Ambulatory Visit (INDEPENDENT_AMBULATORY_CARE_PROVIDER_SITE_OTHER): Payer: Medicare Other

## 2020-06-21 DIAGNOSIS — I469 Cardiac arrest, cause unspecified: Secondary | ICD-10-CM

## 2020-06-28 NOTE — Progress Notes (Signed)
Carelink Summary Report / Loop Recorder 

## 2020-07-02 ENCOUNTER — Other Ambulatory Visit: Payer: Self-pay | Admitting: Neurology

## 2020-07-19 ENCOUNTER — Ambulatory Visit (INDEPENDENT_AMBULATORY_CARE_PROVIDER_SITE_OTHER): Payer: Medicare Other

## 2020-07-19 DIAGNOSIS — R55 Syncope and collapse: Secondary | ICD-10-CM

## 2020-07-21 LAB — CUP PACEART REMOTE DEVICE CHECK
Date Time Interrogation Session: 20220417230200
Implantable Pulse Generator Implant Date: 20210512

## 2020-08-04 NOTE — Progress Notes (Signed)
Carelink Summary Report / Loop Recorder 

## 2020-08-23 ENCOUNTER — Ambulatory Visit (INDEPENDENT_AMBULATORY_CARE_PROVIDER_SITE_OTHER): Payer: Medicare Other

## 2020-08-23 DIAGNOSIS — I469 Cardiac arrest, cause unspecified: Secondary | ICD-10-CM

## 2020-08-24 LAB — CUP PACEART REMOTE DEVICE CHECK
Date Time Interrogation Session: 20220520230450
Implantable Pulse Generator Implant Date: 20210512

## 2020-08-26 ENCOUNTER — Other Ambulatory Visit: Payer: Self-pay | Admitting: Emergency Medicine

## 2020-08-26 MED ORDER — MODAFINIL 200 MG PO TABS: 200.0000 mg | ORAL_TABLET | Freq: Every day | ORAL | 1 refills | Status: DC

## 2020-08-26 NOTE — Addendum Note (Signed)
Addended by: Rhae Lerner R on: 08/26/2020 10:02 AM   Modules accepted: Orders

## 2020-09-13 NOTE — Progress Notes (Signed)
Carelink Summary Report / Loop Recorder 

## 2020-09-22 ENCOUNTER — Emergency Department (HOSPITAL_BASED_OUTPATIENT_CLINIC_OR_DEPARTMENT_OTHER): Payer: Medicare Other

## 2020-09-22 ENCOUNTER — Emergency Department (HOSPITAL_BASED_OUTPATIENT_CLINIC_OR_DEPARTMENT_OTHER)
Admission: EM | Admit: 2020-09-22 | Discharge: 2020-09-22 | Disposition: A | Discharge status: Home / Self Care | Payer: Medicare Other | Attending: Emergency Medicine | Admitting: Emergency Medicine

## 2020-09-22 ENCOUNTER — Encounter (HOSPITAL_BASED_OUTPATIENT_CLINIC_OR_DEPARTMENT_OTHER): Payer: Self-pay | Admitting: Emergency Medicine

## 2020-09-22 ENCOUNTER — Other Ambulatory Visit: Payer: Self-pay

## 2020-09-22 DIAGNOSIS — I10 Essential (primary) hypertension: Secondary | ICD-10-CM | POA: Diagnosis not present

## 2020-09-22 DIAGNOSIS — R1013 Epigastric pain: Secondary | ICD-10-CM | POA: Diagnosis not present

## 2020-09-22 DIAGNOSIS — R1033 Periumbilical pain: Secondary | ICD-10-CM | POA: Diagnosis not present

## 2020-09-22 DIAGNOSIS — R5383 Other fatigue: Secondary | ICD-10-CM | POA: Insufficient documentation

## 2020-09-22 DIAGNOSIS — Z20822 Contact with and (suspected) exposure to covid-19: Secondary | ICD-10-CM | POA: Insufficient documentation

## 2020-09-22 DIAGNOSIS — K920 Hematemesis: Secondary | ICD-10-CM | POA: Diagnosis present

## 2020-09-22 DIAGNOSIS — Z79899 Other long term (current) drug therapy: Secondary | ICD-10-CM | POA: Insufficient documentation

## 2020-09-22 DIAGNOSIS — K469 Unspecified abdominal hernia without obstruction or gangrene: Secondary | ICD-10-CM | POA: Insufficient documentation

## 2020-09-22 DIAGNOSIS — R1010 Upper abdominal pain, unspecified: Secondary | ICD-10-CM

## 2020-09-22 LAB — COMPREHENSIVE METABOLIC PANEL
ALT: 35 U/L (ref 0–44)
AST: 19 U/L (ref 15–41)
Albumin: 5.1 g/dL — ABNORMAL HIGH (ref 3.5–5.0)
Alkaline Phosphatase: 103 U/L (ref 38–126)
Anion gap: 12 (ref 5–15)
BUN: 15 mg/dL (ref 6–20)
CO2: 26 mmol/L (ref 22–32)
Calcium: 10.2 mg/dL (ref 8.9–10.3)
Chloride: 101 mmol/L (ref 98–111)
Creatinine, Ser: 1 mg/dL (ref 0.61–1.24)
GFR, Estimated: >60 mL/min (ref >60)
Glucose, Bld: 94 mg/dL (ref 70–99)
Potassium: 3.6 mmol/L (ref 3.5–5.1)
Sodium: 139 mmol/L (ref 135–145)
Total Bilirubin: 0.6 mg/dL (ref 0.3–1.2)
Total Protein: 8.3 g/dL — ABNORMAL HIGH (ref 6.5–8.1)

## 2020-09-22 LAB — CBC WITH DIFFERENTIAL/PLATELET
Abs Immature Granulocytes: 0.04 10*3/uL (ref 0.00–0.07)
Basophils Absolute: 0 10*3/uL (ref 0.0–0.1)
Basophils Relative: 1 %
Eosinophils Absolute: 0.2 10*3/uL (ref 0.0–0.5)
Eosinophils Relative: 3 %
HCT: 49.1 % (ref 39.0–52.0)
Hemoglobin: 16.5 g/dL (ref 13.0–17.0)
Immature Granulocytes: 1 %
Lymphocytes Relative: 24 %
Lymphs Abs: 1.7 10*3/uL (ref 0.7–4.0)
MCH: 31.1 pg (ref 26.0–34.0)
MCHC: 33.6 g/dL (ref 30.0–36.0)
MCV: 92.6 fL (ref 80.0–100.0)
Monocytes Absolute: 0.5 10*3/uL (ref 0.1–1.0)
Monocytes Relative: 7 %
Neutro Abs: 4.6 10*3/uL (ref 1.7–7.7)
Neutrophils Relative %: 64 %
Platelets: 222 10*3/uL (ref 150–400)
RBC: 5.3 MIL/uL (ref 4.22–5.81)
RDW: 12.3 % (ref 11.5–15.5)
WBC: 7.1 10*3/uL (ref 4.0–10.5)
nRBC: 0 % (ref 0.0–0.2)

## 2020-09-22 LAB — LIPASE, BLOOD: Lipase: 25 U/L (ref 11–51)

## 2020-09-22 LAB — RESP PANEL BY RT-PCR (FLU A&B, COVID) ARPGX2
Influenza A by PCR: NEGATIVE
Influenza B by PCR: NEGATIVE
SARS Coronavirus 2 by RT PCR: NEGATIVE

## 2020-09-22 MED ORDER — IOHEXOL 300 MG/ML  SOLN
100.0000 mL | Freq: Once | INTRAMUSCULAR | Status: AC | PRN
  Administered 2020-09-22: 100 mL via INTRAVENOUS

## 2020-09-22 MED ORDER — MORPHINE SULFATE (PF) 4 MG/ML IV SOLN
4.0000 mg | Freq: Once | INTRAVENOUS | Status: AC
  Administered 2020-09-22: 4 mg via INTRAVENOUS
  Filled 2020-09-22: qty 1

## 2020-09-22 MED ORDER — ONDANSETRON HCL 4 MG/2ML IJ SOLN
4.0000 mg | Freq: Once | INTRAMUSCULAR | Status: AC
  Administered 2020-09-22: 4 mg via INTRAVENOUS
  Filled 2020-09-22: qty 2

## 2020-09-22 MED ORDER — HYDROMORPHONE HCL 1 MG/ML IJ SOLN
1.0000 mg | Freq: Once | INTRAMUSCULAR | Status: AC
  Administered 2020-09-22: 1 mg via INTRAVENOUS
  Filled 2020-09-22: qty 1

## 2020-09-22 MED ORDER — SODIUM CHLORIDE 0.9 % IV BOLUS
500.0000 mL | Freq: Once | INTRAVENOUS | Status: AC
Start: 2020-09-22 — End: 2020-09-22
  Administered 2020-09-22: 500 mL via INTRAVENOUS

## 2020-09-22 NOTE — Discharge Instructions (Addendum)
You have been seen and discharged from the emergency department.  Your blood work is normal, CAT scan showed no acute finding/obstruction/infection.  Follow-up with your surgeon and primary provider for reevaluation and further care. Take home medications as prescribed. If you have any worsening symptoms or further concerns for your health please return to an emergency department for further evaluation.

## 2020-09-22 NOTE — ED Provider Notes (Signed)
[facility] EMERGENCY DEPT Provider Note   CSN: [id number] Arrival date & time: 09/22/20  1905     History Chief Complaint  Patient presents with   Abdominal Pain   Hematemesis    [name] [name] is a 40 y.o. male.  HPI  40 year old male with past medical history of Bechets disease, multiple abdominal surgeries, seizure disorder, HTN, migraines presents the emergency department after 1 episode of hematemesis.  Patient states he has been noticing swelling around his upper abdominal incision site, it is painful when he moves specifically sitting up and has been noticing a bulge.  Earlier today he felt like his stomach was upset he felt generally ill, he had 1 episode of bloody emesis that he described as bright red blood without clots.  He now feels fatigued.  No documented fever, no lower abdominal pain, no diarrhea.  No chest pain or difficulty breathing.  Past Medical History:  Diagnosis Date   Anxiety    Behcet's disease ([place])    Bipolar disorder ([place])    Cardiac arrest ([place]) 12/25/2017   one round of CPR    Depression    Hypertension    Migraine    Seizures ([place])    last one 2017   Sleep apnea    CPAP at night   Vitamin D deficiency     Patient Active Problem List   Diagnosis Date Noted   Chest pain with moderate risk for cardiac etiology 11/29/2018   Cardiac arrest ([place]) 12/25/2017   Intubation of airway performed without difficulty    Lactic acidosis    Acute respiratory failure with hypoxia (HCC)    Seizures (HCC)    Post-ictal coma ([place]) 08/03/2016   Acute respiratory failure with hypoxemia (HCC)    Seizure ([place])    Altered mental status 07/07/2016   Peripheral vertigo 07/17/2015   Vertigo 07/17/2015   Intractable vomiting 07/17/2015   Diplopia    Cellulitis and abscess of trunk-right chest wall 05/08/2013   Dehydration 02/15/2013   Behcet's disease ([place]) 02/15/2013   Recurrent vomiting 02/15/2013   Encounter for therapeutic drug monitoring  06/14/2012   History of oral aphthous ulcers 10/19/2010   BIPOLAR DISORDER UNSPECIFIED 06/18/2008   Essential hypertension 06/18/2008   SYNCOPE AND COLLAPSE 06/18/2008   Epilepsy ([place]) 06/18/2008    Past Surgical History:  Procedure Laterality Date   APPENDECTOMY     CYSTOSCOPY WITH URETHRAL DILATATION N/A 10/15/2019   Procedure: CYSTOSCOPY WITH URETHRAL DILATATION WITH BALLOON;  Surgeon: Lucas Mallow, MD;  Location: [facility];  Service: Urology;  Laterality: N/A;   FRACTURE SURGERY Right    Arm   LEFT HEART CATH AND CORONARY ANGIOGRAPHY N/A 11/29/2018   Procedure: LEFT HEART CATH AND CORONARY ANGIOGRAPHY;  Surgeon: Martinique, Peter M, MD;  Location: [facility];  Service: Cardiovascular;  Laterality: N/A;   maxilofacial     NISSEN FUNDOPLICATION     RADIOLOGY WITH ANESTHESIA N/A 02/13/2019   Procedure: MRI WITH ANESTHESIA OF BRAIN WITH AND WITHOUT CONTRAST;  Surgeon: Radiologist, Medication, MD;  Location: [facility];  Service: Radiology;  Laterality: N/A;       Family History  Problem Relation Age of Onset   Lung cancer Paternal Grandfather    Other Father        BPPV    Social History   Tobacco Use   Smoking status: Never   Smokeless tobacco: Never  Vaping Use   Vaping Use: Never used  Substance Use Topics  Alcohol use: No   Drug use: No    Home Medications Prior to Admission medications   Medication Sig Start Date End Date Taking? Authorizing Provider  Apremilast 30 MG TABS Take 30 mg by mouth daily.     [provider]  atorvastatin (LIPITOR) 80 MG tablet TAKE 1 TABLET(80 MG) BY MOUTH DAILY 11/07/19   Croitoru, Mihai, MD  clonazePAM (KLONOPIN) 0.5 MG tablet Take 0.25 mg by mouth 3 (three) times daily.    [provider]  FLUoxetine (PROZAC) 20 MG tablet Take 20 mg by mouth 2 (two) times daily. 11/27/16   [provider]  lamoTRIgine 200 MG TBDP Take 1 tablet (200 mg total) by mouth in the morning and at bedtime. 05/20/20   Kathrynn Ducking, MD  levETIRAcetam (KEPPRA) 750 MG tablet Take 1 tablet (750 mg total) by mouth 2 (two) times daily. 05/20/20   Kathrynn Ducking, MD  metoprolol tartrate (LOPRESSOR) 25 MG tablet TAKE 1/2 TABLET BY MOUTH TWICE DAILY. 02/24/20   Croitoru, Mihai, MD  modafinil (PROVIGIL) 200 MG tablet Take 1 tablet (200 mg total) by mouth daily. 08/26/20   Kathrynn Ducking, MD  mycophenolate (CELLCEPT) 500 MG tablet Take 500-1,000 mg by mouth See admin instructions. Take 1000 mg in the morning and 500 mg at night 03/20/17   [provider]  QUEtiapine (SEROQUEL) 200 MG tablet Take 200 mg by mouth at bedtime.    [provider]  VITAMIN D PO Take by mouth.    [provider]    Allergies    Patient has no known allergies.  Review of Systems   Review of Systems  Constitutional:  Positive for fatigue. Negative for chills and fever.  HENT:  Negative for congestion.   Eyes:  Negative for visual disturbance.  Respiratory:  Negative for shortness of breath.   Cardiovascular:  Negative for chest pain.  Gastrointestinal:  Positive for abdominal pain, nausea and vomiting. Negative for blood in stool and diarrhea.       + Hematemesis  Genitourinary:  Negative for dysuria and hematuria.  Musculoskeletal:  Negative for back pain.  Skin:  Negative for rash.  Neurological:  Negative for headaches.   Physical Exam Updated Vital Signs BP (!) 133/97   Pulse 87   Temp 98.5 F (36.9 C) (Oral)   Resp 18   Ht 6' (1.829 m)   Wt 104.3 kg   SpO2 96%   BMI 31.19 kg/m   Physical Exam Vitals and nursing note reviewed.  Constitutional:      Appearance: Normal appearance.  HENT:     Head: Normocephalic.     Mouth/Throat:     Mouth: Mucous membranes are moist.  Cardiovascular:     Rate and Rhythm: Normal rate.  Pulmonary:     Effort: Pulmonary effort is normal. No respiratory distress.  Abdominal:     Palpations: Abdomen is soft.     Tenderness: There is abdominal tenderness  in the epigastric area and periumbilical area.     Comments: Midline abdominal hernia noticeable when sitting up, flat when lying down, mild epigastric tenderness to palpation without distention  Skin:    General: Skin is warm.  Neurological:     Mental Status: He is alert and oriented to person, place, and time. Mental status is at baseline.  Psychiatric:        Mood and Affect: Mood normal.    ED Results / Procedures / Treatments   Labs (all labs  ordered are listed, but only abnormal results are displayed) Labs Reviewed  CBC WITH DIFFERENTIAL/PLATELET  LIPASE, BLOOD  COMPREHENSIVE METABOLIC PANEL    EKG None  Radiology No results found.  Procedures Procedures   Medications Ordered in ED Medications  sodium chloride 0.9 % bolus 500 mL (has no administration in time range)  ondansetron (ZOFRAN) injection 4 mg (has no administration in time range)  morphine 4 MG/ML injection 4 mg (has no administration in time range)    ED Course  I have reviewed the triage vital signs and the nursing notes.  Pertinent labs & imaging results that were available during my care of the patient were reviewed by me and considered in my medical decision making (see chart for details).    MDM Rules/Calculators/A&P                          40 year old male presents emergency department with abdominal pain and 1 episode of hematemesis.  Vital signs are stable, abdominal exam shows some upper abdominal tenderness.  When he sits up he does have a bulging hernia just under his incision but this goes away and is soft when he is laying flat.  No signs of incarceration/strangulation.  Blood work is normal, no acute anemia, no ongoing vomiting/hematemesis.  CT shows no acute finding.  After pain medicine patient feels better, he has been feeling without any nausea/vomiting.  Patient will be discharged and treated as an outpatient and follow-up with his surgeon.  Discharge plan and strict return to ED  precautions discussed, patient verbalizes understanding and agreement.   Final Clinical Impression(s) / ED Diagnoses Final diagnoses:  None    Rx / DC Orders ED Discharge Orders     None        Lorelle Gibbs, DO 09/22/20 2332

## 2020-09-22 NOTE — ED Triage Notes (Signed)
  Patient comes in with upper abdominal pain and bloody emesis that has been going on for a few days.  Patient has hx of Bechets disease and has had 4 abdominal surgeries in the last 2 years.  Patient states he started spitting up blood earlier today and was concerned.  States it "feels like my organs are popping out".  Pain 7/10 stabbing pain in epigastric area.

## 2020-09-23 LAB — CUP PACEART REMOTE DEVICE CHECK
Date Time Interrogation Session: 20220622230822
Implantable Pulse Generator Implant Date: 20210512

## 2020-09-27 ENCOUNTER — Ambulatory Visit (INDEPENDENT_AMBULATORY_CARE_PROVIDER_SITE_OTHER): Payer: Medicare Other

## 2020-09-27 DIAGNOSIS — I469 Cardiac arrest, cause unspecified: Secondary | ICD-10-CM

## 2020-10-13 ENCOUNTER — Other Ambulatory Visit: Payer: Self-pay | Admitting: Cardiovascular Disease

## 2020-10-14 NOTE — Progress Notes (Signed)
Carelink Summary Report / Loop Recorder 

## 2020-10-25 ENCOUNTER — Encounter: Payer: Self-pay | Admitting: *Deleted

## 2020-10-25 ENCOUNTER — Telehealth: Payer: Self-pay | Admitting: Neurology

## 2020-10-25 MED ORDER — LEVETIRACETAM 750 MG PO TABS
750.0000 mg | ORAL_TABLET | Freq: Two times a day (BID) | ORAL | 1 refills | Status: DC
Start: 2020-10-25 — End: 2020-10-27

## 2020-10-25 NOTE — Telephone Encounter (Signed)
Pt request refill levETIRAcetam (KEPPRA) 750 MG tablet at [facility] [phone]

## 2020-10-27 MED ORDER — LEVETIRACETAM 750 MG PO TABS
750.0000 mg | ORAL_TABLET | Freq: Two times a day (BID) | ORAL | 1 refills | Status: DC
Start: 2020-10-27 — End: 2020-11-02

## 2020-10-27 NOTE — Telephone Encounter (Signed)
Patient sent message stating pharmacy doesn't have keppra refill sent on 10/25/20. [facility], spoke with pharmacist who stated they did not receive Keppra refill on 10/25/20. I advised her will resend Rx. She  verbalized understanding, appreciation.

## 2020-10-27 NOTE — Addendum Note (Signed)
Addended by: Minna Antis on: 10/27/2020 05:08 PM   Modules accepted: Orders

## 2020-10-28 ENCOUNTER — Ambulatory Visit (INDEPENDENT_AMBULATORY_CARE_PROVIDER_SITE_OTHER): Payer: Medicare Other

## 2020-10-28 DIAGNOSIS — R55 Syncope and collapse: Secondary | ICD-10-CM | POA: Diagnosis not present

## 2020-10-29 LAB — CUP PACEART REMOTE DEVICE CHECK
Date Time Interrogation Session: 20220725230439
Implantable Pulse Generator Implant Date: 20210512

## 2020-11-02 ENCOUNTER — Encounter: Payer: Self-pay | Admitting: *Deleted

## 2020-11-02 ENCOUNTER — Other Ambulatory Visit: Payer: Self-pay | Admitting: *Deleted

## 2020-11-02 DIAGNOSIS — R569 Unspecified convulsions: Secondary | ICD-10-CM

## 2020-11-02 MED ORDER — LEVETIRACETAM 750 MG PO TABS: 750.0000 mg | ORAL_TABLET | Freq: Two times a day (BID) | ORAL | 1 refills | Status: DC

## 2020-11-02 NOTE — Telephone Encounter (Signed)
Received message from patient re: [facility] still doesn't have keppra Rx. Resent Rx again, called pharmacy to check on Rx. Was on hold 10 minutes. Will call back later.

## 2020-11-02 NOTE — Telephone Encounter (Signed)
Called [facility], spoke with Beth who verified new Rx was received today. She stated they gave patient liquid yesterday, patient had asked for liquid. I informed her that Dr Jannifer Franklin changed from  liquid to 750 mg tablets in Feb 20222. She will let pharmacist know. Sent patient my chart to inform him.

## 2020-11-18 ENCOUNTER — Ambulatory Visit: Payer: Medicare Other | Admitting: Neurology

## 2020-11-23 ENCOUNTER — Telehealth: Payer: Self-pay | Admitting: *Deleted

## 2020-11-23 NOTE — Progress Notes (Signed)
Carelink Summary Report / Loop Recorder 

## 2020-11-23 NOTE — Telephone Encounter (Signed)
The DMV form was filled out and signed.

## 2020-11-23 NOTE — Telephone Encounter (Signed)
Received [place] DMV form. Placed in MD in box for review, completion, signature.

## 2020-11-24 DIAGNOSIS — Z0289 Encounter for other administrative examinations: Secondary | ICD-10-CM

## 2020-11-24 NOTE — Telephone Encounter (Signed)
Form signed, given to debra in medical records

## 2020-11-28 NOTE — Progress Notes (Signed)
Electrophysiology Office Note Date: 11/29/2020  ID:  [name] [name], [name] [name] Jun 15, 1980, MRN [id number]  PCP: [name] Primary Cardiologist: [name] Klein, MD Electrophysiologist: [name] Klein, MD   CC: ILR follow-up  [name] [name] is a 40 y.o. male seen today for  [name] [name]  . he presents today for routine electrophysiology followup.  Since last being seen in our clinic, the patient reports doing very well. He did have long COVID last year, but otherwise no major complaints. He is very hesitant to exercise given his history of ?cardiac arrest while swimming. Thus far cardiac work up has been unremarkable. he denies chest pain, palpitations, dyspnea, PND, orthopnea, nausea, vomiting, dizziness, syncope, edema, weight gain, or early satiety.  Device History: Medtronic loop recorder implanted 08/2019 for syncope  Past Medical History:  Diagnosis Date   Anxiety    Behcet's disease ([place])    Bipolar disorder ([place])    Cardiac arrest ([place]) 12/25/2017   one round of CPR    Depression    Hypertension    Migraine    Seizures ([place])    last one 2017   Sleep apnea    CPAP at night   Vitamin D deficiency    Past Surgical History:  Procedure Laterality Date   APPENDECTOMY     CYSTOSCOPY WITH URETHRAL DILATATION N/A 10/15/2019   Procedure: CYSTOSCOPY WITH URETHRAL DILATATION WITH BALLOON;  Surgeon: [name] Mallow, MD;  [facility];  Service: Urology;  Laterality: N/A;   FRACTURE SURGERY Right    Arm   LEFT HEART CATH AND CORONARY ANGIOGRAPHY N/A 11/29/2018   Procedure: LEFT HEART CATH AND CORONARY ANGIOGRAPHY;  Surgeon: [name], [name] M, MD;  Location: [facility];  Service: Cardiovascular;  Laterality: N/A;   maxilofacial     NISSEN FUNDOPLICATION     RADIOLOGY WITH ANESTHESIA N/A 02/13/2019   Procedure: MRI WITH ANESTHESIA OF BRAIN WITH AND WITHOUT CONTRAST;  Surgeon: Radiologist, Medication, MD;  Location: [facility];  Service: Radiology;   Laterality: N/A;    Current Outpatient Medications  Medication Sig Dispense Refill   Apremilast 30 MG TABS Take 30 mg by mouth daily.      atorvastatin (LIPITOR) 80 MG tablet TAKE 1 TABLET(80 MG) BY MOUTH DAILY 90 tablet 3   clonazePAM (KLONOPIN) 0.5 MG tablet Take 0.25 mg by mouth 3 (three) times daily.     FLUoxetine (PROZAC) 20 MG tablet Take 20 mg by mouth 2 (two) times daily.     lamoTRIgine 200 MG TBDP Take 1 tablet (200 mg total) by mouth in the morning and at bedtime. 180 tablet 3   levETIRAcetam (KEPPRA) 750 MG tablet Take 1 tablet (750 mg total) by mouth 2 (two) times daily. 180 tablet 1   metoprolol tartrate (LOPRESSOR) 25 MG tablet TAKE 1/2 TABLET BY MOUTH TWICE DAILY. 90 tablet 3   mycophenolate (CELLCEPT) 500 MG tablet Take 500-1,000 mg by mouth See admin instructions. Take 1000 mg in the morning and 500 mg at night  2   QUEtiapine (SEROQUEL) 200 MG tablet Take 200 mg by mouth at bedtime.     VITAMIN D PO Take by mouth.     modafinil (PROVIGIL) 200 MG tablet Take 1 tablet (200 mg total) by mouth daily. (Patient not taking: Reported on 11/29/2020) 90 tablet 1   Current Facility-Administered Medications  Medication Dose Route Frequency Provider Last Rate Last Admin   lidocaine-EPINEPHrine (XYLOCAINE W/EPI) 1 %-1:100000 (with pres) injection 15  mL  15 mL Infiltration Once [name], Mihai, MD        Allergies:   Patient has no known allergies.   Social History: Social History   Socioeconomic History   Marital status: Single    Spouse name: Not on file   Number of children: 0   Years of education: 16   Highest education level: Not on file  Occupational History    Employer: DISABLED  Tobacco Use   Smoking status: Never   Smokeless tobacco: Never  Vaping Use   Vaping Use: Never used  Substance and Sexual Activity   Alcohol use: No   Drug use: No   Sexual activity: Not on file  Other Topics Concern   Not on file  Social History Narrative   Lives with mom and dad    Right Handed   Drinks >10 cups caffeine   Social Determinants of Health   Financial Resource Strain: Not on file  Food Insecurity: Not on file  Transportation Needs: Not on file  Physical Activity: Not on file  Stress: Not on file  Social Connections: Not on file  Intimate Partner Violence: Not on file    Family History: Family History  Problem Relation Age of Onset   Lung cancer Paternal Grandfather    Other Father        BPPV     Review of Systems: All other systems reviewed and are otherwise negative except as noted above.  Physical Exam: Vitals:   11/29/20 0825  BP: 110/78  Pulse: 82  SpO2: 99%  Weight: 237 lb 3.2 oz (107.6 kg)  Height: 6' (1.829 [name])     GEN- The patient is well appearing, alert and oriented x 3 today.   HEENT: normocephalic, atraumatic; sclera clear, conjunctiva pink; hearing intact; oropharynx clear; neck supple  Lungs- Clear to ausculation bilaterally, normal work of breathing.  No wheezes, rales, rhonchi Heart- Regular rate and rhythm, no murmurs, rubs or gallops  GI- soft, non-tender, non-distended, bowel sounds present  Extremities- no clubbing, cyanosis, or edema  MS- no significant deformity or atrophy Skin- warm and dry, no rash or lesion; PPM pocket well healed Psych- euthymic mood, full affect Neuro- strength and sensation are intact  PPM Interrogation- daily transmission reviewed on MDT website. 1 symptom episode associated with SOB and palpitations showed sinus tach in 100-110 range. No other episodes.    EKG:  EKG is ordered today. The ekg ordered today shows NSR at 82 bpm, personally reviewed  Recent Labs: 09/22/2020: ALT 35; BUN 15; Creatinine, Ser 1.00; Hemoglobin 16.5; Platelets 222; Potassium 3.6; Sodium 139   Wt Readings from Last 3 Encounters:  11/29/20 237 lb 3.2 oz (107.6 kg)  09/22/20 230 lb (104.3 kg)  05/20/20 229 lb 9.6 oz (104.1 kg)     Other studies Reviewed: Additional studies/ records that were reviewed  today include: Echo 12/2017 shows LVEF 65-70%, Previous EP office notes, Previous remote checks, Most recent labwork.   Assessment and Plan:  1. Syncope s/p Medtronic Loop recorder Normal device function See Pace Art report No changes today No further  2. Health Management Screening CT Calcium 12/2019 with score of 0 (best).  Current medicines are reviewed at length with the patient today.   The patient does not have concerns regarding his medicines.  The following changes were made today:  none  Labs/ tests ordered today include:  Labs from 09/2020 reviewed and stable.   Disposition:   Follow up with  [name] [name]  annually.    Jacalyn Lefevre, PA-C  11/29/2020 8:36 AM  [facility] [address] [place] [address] [phone] (office) [phone] (fax)

## 2020-11-29 ENCOUNTER — Other Ambulatory Visit: Payer: Self-pay

## 2020-11-29 ENCOUNTER — Encounter: Payer: Self-pay | Admitting: Student

## 2020-11-29 ENCOUNTER — Ambulatory Visit (INDEPENDENT_AMBULATORY_CARE_PROVIDER_SITE_OTHER): Payer: Medicare Other | Admitting: Student

## 2020-11-29 DIAGNOSIS — R55 Syncope and collapse: Secondary | ICD-10-CM

## 2020-11-29 NOTE — Patient Instructions (Signed)
Medication Instructions:  Your physician recommends that you continue on your current medications as directed. Please refer to the Current Medication list given to you today.  *If you need a refill on your cardiac medications before your next appointment, please call your pharmacy*   Lab Work: None If you have labs (blood work) drawn today and your tests are completely normal, you will receive your results only by: [place] (if you have MyChart) OR A paper copy in the mail If you have any lab test that is abnormal or we need to change your treatment, we will call you to review the results.   Follow-Up: At [facility], you and your health needs are our priority.  As part of our continuing mission to provide you with exceptional heart care, we have created designated Provider Care Teams.  These Care Teams include your primary Cardiologist (physician) and Advanced Practice Providers (APPs -  Physician Assistants and Nurse Practitioners) who all work together to provide you with the care you need, when you need it.   Your next appointment:   1 year(s)  The format for your next appointment:   In Person  Provider:   You may see Sanda Klein, MD or one of the following Advanced Practice Providers on your designated Care Team:   Almyra Deforest, PA-C Fabian Sharp, PA-C or  [name], [place]

## 2020-11-30 ENCOUNTER — Ambulatory Visit (INDEPENDENT_AMBULATORY_CARE_PROVIDER_SITE_OTHER): Payer: Medicare Other

## 2020-11-30 DIAGNOSIS — R55 Syncope and collapse: Secondary | ICD-10-CM

## 2020-11-30 LAB — CUP PACEART REMOTE DEVICE CHECK
Date Time Interrogation Session: 20220827230535
Implantable Pulse Generator Implant Date: 20210512

## 2020-12-02 ENCOUNTER — Ambulatory Visit (INDEPENDENT_AMBULATORY_CARE_PROVIDER_SITE_OTHER): Payer: Medicare Other | Admitting: Neurology

## 2020-12-02 ENCOUNTER — Encounter: Payer: Self-pay | Admitting: Neurology

## 2020-12-02 ENCOUNTER — Other Ambulatory Visit: Payer: Self-pay

## 2020-12-02 VITALS — BP 135/90 | HR 111 | Ht 72.0 in | Wt 234.6 lb

## 2020-12-02 DIAGNOSIS — M352 Behcet's disease: Secondary | ICD-10-CM | POA: Diagnosis not present

## 2020-12-02 DIAGNOSIS — G40909 Epilepsy, unspecified, not intractable, without status epilepticus: Secondary | ICD-10-CM | POA: Diagnosis not present

## 2020-12-02 NOTE — Progress Notes (Signed)
Reason for visit: Behcet's disease, seizures  [name] [name] is an 40 y.o. male  History of present illness:  [name] [name] is a 40 year old right-handed white male with a history of Behcet's disease associated with seizures.  The patient has done well with seizure control on a combination of Keppra and Lamictal.  He denies any side effects on the medication.  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  He was on Provigil for severe fatigue after COVID infection but this has improved and he has stopped the medication.  He returns the office today for further evaluation.  Past Medical History:  Diagnosis Date   Anxiety    Behcet's disease ([place])    Bipolar disorder ([place])    Cardiac arrest ([place]) 12/25/2017   one round of CPR    Depression    Hypertension    Migraine    Seizures ([place])    last one 2017   Sleep apnea    CPAP at night   Vitamin D deficiency     Past Surgical History:  Procedure Laterality Date   APPENDECTOMY     CYSTOSCOPY WITH URETHRAL DILATATION N/A 10/15/2019   Procedure: CYSTOSCOPY WITH URETHRAL DILATATION WITH BALLOON;  Surgeon: Lucas Mallow, MD;  [facility];  Service: Urology;  Laterality: N/A;   FRACTURE SURGERY Right    Arm   LEFT HEART CATH AND CORONARY ANGIOGRAPHY N/A 11/29/2018   Procedure: LEFT HEART CATH AND CORONARY ANGIOGRAPHY;  Surgeon: Martinique, Peter M, MD;  Location: [facility];  Service: Cardiovascular;  Laterality: N/A;   maxilofacial     NISSEN FUNDOPLICATION     RADIOLOGY WITH ANESTHESIA N/A 02/13/2019   Procedure: MRI WITH ANESTHESIA OF BRAIN WITH AND WITHOUT CONTRAST;  Surgeon: Radiologist, Medication, MD;  Location: [facility];  Service: Radiology;  Laterality: N/A;    Family History  Problem Relation Age of Onset   Lung cancer Paternal Grandfather    Other Father        BPPV    Social history:  reports that he has never smoked. He has never used smokeless tobacco. He reports that he does not drink alcohol and does not  use drugs.   No Known Allergies  Medications:  Prior to Admission medications   Medication Sig Start Date End Date Taking? Authorizing Provider  Apremilast 30 MG TABS Take 30 mg by mouth daily.     [provider]  atorvastatin (LIPITOR) 80 MG tablet TAKE 1 TABLET(80 MG) BY MOUTH DAILY 10/13/20   Croitoru, Mihai, MD  clonazePAM (KLONOPIN) 0.5 MG tablet Take 0.25 mg by mouth 3 (three) times daily.    [provider]  FLUoxetine (PROZAC) 20 MG tablet Take 20 mg by mouth 2 (two) times daily. 11/27/16   [provider]  lamoTRIgine 200 MG TBDP Take 1 tablet (200 mg total) by mouth in the morning and at bedtime. 05/20/20   Kathrynn Ducking, MD  levETIRAcetam (KEPPRA) 750 MG tablet Take 1 tablet (750 mg total) by mouth 2 (two) times daily. 11/02/20   Kathrynn Ducking, MD  metoprolol tartrate (LOPRESSOR) 25 MG tablet TAKE 1/2 TABLET BY MOUTH TWICE DAILY. 02/24/20   Croitoru, Mihai, MD  modafinil (PROVIGIL) 200 MG tablet Take 1 tablet (200 mg total) by mouth daily. Patient not taking: Reported on 11/29/2020 08/26/20   Kathrynn Ducking, MD  mycophenolate (CELLCEPT) 500 MG tablet Take 500-1,000 mg by mouth See admin instructions. Take 1000 mg in  the morning and 500 mg at night 03/20/17   [provider]  QUEtiapine (SEROQUEL) 200 MG tablet Take 200 mg by mouth at bedtime.    [provider]  VITAMIN D PO Take by mouth.    [provider]    ROS:  Out of a complete 14 system review of symptoms, the patient complains only of the following symptoms, and all other reviewed systems are negative.  Skin sores History of seizures  Blood pressure 135/90, pulse (!) 111, height 6' (1.829 m), weight 234 lb 9.6 oz (106.4 kg).  Physical Exam  General: The patient is alert and cooperative at the time of the examination.  The patient is moderately obese.  Skin: No significant peripheral edema is noted.   Neurologic Exam  Mental status: The patient is  alert and oriented x 3 at the time of the examination. The patient has apparent normal recent and remote memory, with an apparently normal attention span and concentration ability.   Cranial nerves: Facial symmetry is present. Speech is normal, no aphasia or dysarthria is noted. Extraocular movements are full. Visual fields are full.  Motor: The patient has good strength in all 4 extremities.  Sensory examination: Soft touch sensation is symmetric on the face, arms, and legs.  Coordination: The patient has good finger-nose-finger and heel-to-shin bilaterally.  Gait and station: The patient has a normal gait. Tandem gait is normal. Romberg is negative. No drift is seen.  Reflexes: Deep tendon reflexes are symmetric.   Assessment/Plan:  1.  Behcet's disease  2.  History of seizures  The patient is doing well with seizure control currently.  He will continue on the Lamictal and Keppra.  He will follow-up here in 6 months, in the future he can be seen through Dr. April Manson.  Jill Alexanders MD 12/02/2020 12:11 PM  [facility] [address] [place], [place] [address]  Phone [phone] Fax [phone]

## 2020-12-13 NOTE — Progress Notes (Signed)
Carelink Summary Report / Loop Recorder 

## 2021-01-03 ENCOUNTER — Ambulatory Visit (INDEPENDENT_AMBULATORY_CARE_PROVIDER_SITE_OTHER): Payer: Medicare Other

## 2021-01-03 DIAGNOSIS — R55 Syncope and collapse: Secondary | ICD-10-CM | POA: Diagnosis not present

## 2021-01-03 LAB — CUP PACEART REMOTE DEVICE CHECK
Date Time Interrogation Session: 20220929230759
Implantable Pulse Generator Implant Date: 20210512

## 2021-01-10 NOTE — Progress Notes (Signed)
Carelink Summary Report / Loop Recorder 

## 2021-02-02 LAB — CUP PACEART REMOTE DEVICE CHECK
Date Time Interrogation Session: 20221101230414
Implantable Pulse Generator Implant Date: 20210512

## 2021-02-07 ENCOUNTER — Ambulatory Visit (INDEPENDENT_AMBULATORY_CARE_PROVIDER_SITE_OTHER): Payer: Medicare Other

## 2021-02-07 DIAGNOSIS — I469 Cardiac arrest, cause unspecified: Secondary | ICD-10-CM | POA: Diagnosis not present

## 2021-02-08 ENCOUNTER — Other Ambulatory Visit: Payer: Self-pay | Admitting: Cardiovascular Disease

## 2021-02-10 ENCOUNTER — Other Ambulatory Visit: Payer: Self-pay

## 2021-02-10 ENCOUNTER — Ambulatory Visit (INDEPENDENT_AMBULATORY_CARE_PROVIDER_SITE_OTHER): Payer: Medicare Other | Admitting: Neurology

## 2021-02-10 ENCOUNTER — Encounter: Payer: Self-pay | Admitting: Neurology

## 2021-02-10 VITALS — BP 125/86 | HR 91 | Ht 73.0 in | Wt 230.0 lb

## 2021-02-10 DIAGNOSIS — R569 Unspecified convulsions: Secondary | ICD-10-CM | POA: Diagnosis not present

## 2021-02-10 NOTE — Progress Notes (Signed)
Reason for visit: Behcet's disease, seizures  [name] [name] is an 40 y.o. male  Interval History:  This is a 40 year old gentleman previously followed by Dr. Jannifer Franklin for seizure and Behcet's disease, doing well in term of his seizures, last last visit was on September 1, at that time he reported being seizure-free for more than 4 years.  He is currently on Keppra 750 mg twice daily and lamotrigine 200 mg twice daily.  He is presenting today needing a clearance for DMV in order to drive again, no other complaints.     History of present illness: [name] [name] is a 40 year old right-handed white male with a history of Behcet's disease associated with seizures.  The patient has done well with seizure control on a combination of Keppra and Lamictal.  He denies any side effects on the medication.  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  He was on Provigil for severe fatigue after COVID infection but this has improved and he has stopped the medication.  He returns the office today for further evaluation.  Past Medical History:  Diagnosis Date   Anxiety    Behcet's disease ([place])    Bipolar disorder ([place])    Cardiac arrest ([place]) 12/25/2017   one round of CPR    Depression    Hypertension    Migraine    Seizures ([place])    last one 2017   Sleep apnea    CPAP at night   Vitamin D deficiency     Past Surgical History:  Procedure Laterality Date   APPENDECTOMY     CYSTOSCOPY WITH URETHRAL DILATATION N/A 10/15/2019   Procedure: CYSTOSCOPY WITH URETHRAL DILATATION WITH BALLOON;  Surgeon: Lucas Mallow, MD;  Location: [facility];  Service: Urology;  Laterality: N/A;   FRACTURE SURGERY Right    Arm   LEFT HEART CATH AND CORONARY ANGIOGRAPHY N/A 11/29/2018   Procedure: LEFT HEART CATH AND CORONARY ANGIOGRAPHY;  Surgeon: Martinique, Peter M, MD;  Location: [facility];  Service: Cardiovascular;  Laterality: N/A;   maxilofacial     NISSEN FUNDOPLICATION     RADIOLOGY WITH ANESTHESIA N/A  02/13/2019   Procedure: MRI WITH ANESTHESIA OF BRAIN WITH AND WITHOUT CONTRAST;  Surgeon: Radiologist, Medication, MD;  Location: [facility];  Service: Radiology;  Laterality: N/A;    Family History  Problem Relation Age of Onset   Lung cancer Paternal Grandfather    Other Father        BPPV    Social history:  reports that he has never smoked. He has never used smokeless tobacco. He reports that he does not drink alcohol and does not use drugs.   No Known Allergies  Medications:  Prior to Admission medications   Medication Sig Start Date End Date Taking? Authorizing Provider  Apremilast 30 MG TABS Take 30 mg by mouth daily.     [provider]  atorvastatin (LIPITOR) 80 MG tablet TAKE 1 TABLET(80 MG) BY MOUTH DAILY 10/13/20   Croitoru, Mihai, MD  clonazePAM (KLONOPIN) 0.5 MG tablet Take 0.25 mg by mouth 3 (three) times daily.    [provider]  FLUoxetine (PROZAC) 20 MG tablet Take 20 mg by mouth 2 (two) times daily. 11/27/16   [provider]  lamoTRIgine 200 MG TBDP Take 1 tablet (200 mg total) by mouth in the morning and at bedtime. 05/20/20   Kathrynn Ducking, MD  levETIRAcetam (KEPPRA) 750 MG tablet Take 1 tablet (750  mg total) by mouth 2 (two) times daily. 11/02/20   Kathrynn Ducking, MD  metoprolol tartrate (LOPRESSOR) 25 MG tablet TAKE 1/2 TABLET BY MOUTH TWICE DAILY. 02/24/20   Croitoru, Mihai, MD  modafinil (PROVIGIL) 200 MG tablet Take 1 tablet (200 mg total) by mouth daily. Patient not taking: Reported on 11/29/2020 08/26/20   Kathrynn Ducking, MD  mycophenolate (CELLCEPT) 500 MG tablet Take 500-1,000 mg by mouth See admin instructions. Take 1000 mg in the morning and 500 mg at night 03/20/17   [provider]  QUEtiapine (SEROQUEL) 200 MG tablet Take 200 mg by mouth at bedtime.    [provider]  VITAMIN D PO Take by mouth.    [provider]    ROS:  Out of a complete 14 system review of symptoms, the patient complains  only of the following symptoms, and all other reviewed systems are negative.  Skin sores History of seizures  Blood pressure 125/86, pulse 91, height 6\' 1"  (1.854 m), weight 230 lb (104.3 kg).  Physical Exam  General: The patient is alert and cooperative at the time of the examination.  The patient is moderately obese.  Skin: No significant peripheral edema is noted.   Neurologic Exam  Mental status: The patient is alert and oriented x 3 at the time of the examination. The patient has apparent normal recent and remote memory, with an apparently normal attention span and concentration ability.   Cranial nerves: Facial symmetry is present. Speech is normal, no aphasia or dysarthria is noted. Extraocular movements are full. Visual fields are full.  Motor: The patient has good strength in all 4 extremities.  Sensory examination: Soft touch sensation is symmetric on the face, arms, and legs.  Coordination: The patient has good finger-nose-finger and heel-to-shin bilaterally.  Gait and station: The patient has a normal gait. Tandem gait is normal. Romberg is negative. No drift is seen.  Reflexes: Deep tendon reflexes are symmetric.   Assessment/Plan:  1.  Behcet's disease  2.  History of seizures  The patient is doing well with seizure control currently.  He will continue on the Lamictal and Keppra.  We will obtain level today and follow up in 1 year or sooner if worse.   Alric Ran, MD 02/10/2021 2:03 PM  [facility] [address] [place], [place] [address]  Phone [phone] Fax [phone]

## 2021-02-10 NOTE — Patient Instructions (Signed)
Continue current medications  Will obtain level today  Follow up in 1 year or sooner if worse

## 2021-02-10 NOTE — Progress Notes (Signed)
Carelink Summary Report / Loop Recorder 

## 2021-02-14 ENCOUNTER — Other Ambulatory Visit (INDEPENDENT_AMBULATORY_CARE_PROVIDER_SITE_OTHER): Payer: Self-pay

## 2021-02-14 DIAGNOSIS — Z0289 Encounter for other administrative examinations: Secondary | ICD-10-CM

## 2021-02-17 LAB — COMPREHENSIVE METABOLIC PANEL
ALT: 25 IU/L (ref 0–44)
AST: 18 IU/L (ref 0–40)
Albumin/Globulin Ratio: 2.1 (ref 1.2–2.2)
Albumin: 4.8 g/dL (ref 4.0–5.0)
Alkaline Phosphatase: 126 IU/L — ABNORMAL HIGH (ref 44–121)
BUN/Creatinine Ratio: 17 (ref 9–20)
BUN: 18 mg/dL (ref 6–24)
Bilirubin Total: 0.2 mg/dL (ref 0.0–1.2)
CO2: 22 mmol/L (ref 20–29)
Calcium: 9.5 mg/dL (ref 8.7–10.2)
Chloride: 103 mmol/L (ref 96–106)
Creatinine, Ser: 1.04 mg/dL (ref 0.76–1.27)
Globulin, Total: 2.3 g/dL (ref 1.5–4.5)
Glucose: 119 mg/dL — ABNORMAL HIGH (ref 70–99)
Potassium: 4 mmol/L (ref 3.5–5.2)
Sodium: 142 mmol/L (ref 134–144)
Total Protein: 7.1 g/dL (ref 6.0–8.5)
eGFR: 92 mL/min/{1.73_m2} (ref >59)

## 2021-02-17 LAB — LAMOTRIGINE LEVEL: Lamotrigine Lvl: 5.9 ug/mL (ref 2.0–20.0)

## 2021-02-17 LAB — LEVETIRACETAM LEVEL: Levetiracetam Lvl: 24.1 ug/mL (ref 10.0–40.0)

## 2021-03-06 ENCOUNTER — Encounter (HOSPITAL_BASED_OUTPATIENT_CLINIC_OR_DEPARTMENT_OTHER): Payer: Self-pay | Admitting: Emergency Medicine

## 2021-03-06 ENCOUNTER — Other Ambulatory Visit: Payer: Self-pay

## 2021-03-06 ENCOUNTER — Emergency Department (HOSPITAL_BASED_OUTPATIENT_CLINIC_OR_DEPARTMENT_OTHER)
Admission: EM | Admit: 2021-03-06 | Discharge: 2021-03-06 | Disposition: A | Discharge status: Home / Self Care | Payer: Medicare Other | Attending: Emergency Medicine | Admitting: Emergency Medicine

## 2021-03-06 ENCOUNTER — Emergency Department (HOSPITAL_BASED_OUTPATIENT_CLINIC_OR_DEPARTMENT_OTHER): Payer: Medicare Other

## 2021-03-06 DIAGNOSIS — Z79899 Other long term (current) drug therapy: Secondary | ICD-10-CM | POA: Insufficient documentation

## 2021-03-06 DIAGNOSIS — I10 Essential (primary) hypertension: Secondary | ICD-10-CM | POA: Insufficient documentation

## 2021-03-06 DIAGNOSIS — K279 Peptic ulcer, site unspecified, unspecified as acute or chronic, without hemorrhage or perforation: Secondary | ICD-10-CM | POA: Insufficient documentation

## 2021-03-06 DIAGNOSIS — R1012 Left upper quadrant pain: Secondary | ICD-10-CM | POA: Diagnosis present

## 2021-03-06 DIAGNOSIS — K253 Acute gastric ulcer without hemorrhage or perforation: Secondary | ICD-10-CM

## 2021-03-06 LAB — COMPREHENSIVE METABOLIC PANEL
ALT: 24 U/L (ref 0–44)
AST: 18 U/L (ref 15–41)
Albumin: 4.6 g/dL (ref 3.5–5.0)
Alkaline Phosphatase: 85 U/L (ref 38–126)
Anion gap: 10 (ref 5–15)
BUN: 12 mg/dL (ref 6–20)
CO2: 27 mmol/L (ref 22–32)
Calcium: 9.3 mg/dL (ref 8.9–10.3)
Chloride: 102 mmol/L (ref 98–111)
Creatinine, Ser: 0.94 mg/dL (ref 0.61–1.24)
GFR, Estimated: >60 mL/min (ref >60)
Glucose, Bld: 94 mg/dL (ref 70–99)
Potassium: 3.5 mmol/L (ref 3.5–5.1)
Sodium: 139 mmol/L (ref 135–145)
Total Bilirubin: 0.5 mg/dL (ref 0.3–1.2)
Total Protein: 7.3 g/dL (ref 6.5–8.1)

## 2021-03-06 LAB — URINALYSIS, ROUTINE W REFLEX MICROSCOPIC
Bilirubin Urine: NEGATIVE
Glucose, UA: NEGATIVE mg/dL
Hgb urine dipstick: NEGATIVE
Ketones, ur: NEGATIVE mg/dL
Leukocytes,Ua: NEGATIVE
Nitrite: NEGATIVE
Protein, ur: NEGATIVE mg/dL
Specific Gravity, Urine: 1.021 (ref 1.005–1.030)
pH: 6 (ref 5.0–8.0)

## 2021-03-06 LAB — LIPASE, BLOOD: Lipase: 22 U/L (ref 11–51)

## 2021-03-06 LAB — CBC
HCT: 44.7 % (ref 39.0–52.0)
Hemoglobin: 14.7 g/dL (ref 13.0–17.0)
MCH: 30.4 pg (ref 26.0–34.0)
MCHC: 32.9 g/dL (ref 30.0–36.0)
MCV: 92.4 fL (ref 80.0–100.0)
Platelets: 205 10*3/uL (ref 150–400)
RBC: 4.84 MIL/uL (ref 4.22–5.81)
RDW: 12.1 % (ref 11.5–15.5)
WBC: 7.5 10*3/uL (ref 4.0–10.5)
nRBC: 0 % (ref 0.0–0.2)

## 2021-03-06 MED ORDER — SUCRALFATE 1 G PO TABS
1.0000 g | ORAL_TABLET | Freq: Once | ORAL | Status: DC
  Filled 2021-03-06: qty 1

## 2021-03-06 MED ORDER — LIDOCAINE VISCOUS HCL 2 % MT SOLN
15.0000 mL | Freq: Once | OROMUCOSAL | Status: AC
  Administered 2021-03-06: 20:00:00 15 mL via ORAL
  Filled 2021-03-06: qty 15

## 2021-03-06 MED ORDER — ALUM & MAG HYDROXIDE-SIMETH 200-200-20 MG/5ML PO SUSP
30.0000 mL | Freq: Once | ORAL | Status: AC
  Administered 2021-03-06: 20:00:00 30 mL via ORAL
  Filled 2021-03-06: qty 30

## 2021-03-06 MED ORDER — MORPHINE SULFATE (PF) 2 MG/ML IV SOLN
2.0000 mg | Freq: Once | INTRAVENOUS | Status: AC
  Administered 2021-03-06: 20:00:00 2 mg via INTRAVENOUS
  Filled 2021-03-06: qty 1

## 2021-03-06 MED ORDER — PANTOPRAZOLE SODIUM 20 MG PO TBEC: 20.0000 mg | DELAYED_RELEASE_TABLET | Freq: Every day | ORAL | 0 refills | Status: DC

## 2021-03-06 MED ORDER — LIDOCAINE VISCOUS HCL 2 % MT SOLN: 15.0000 mL | OROMUCOSAL | 0 refills | Status: DC | PRN

## 2021-03-06 MED ORDER — IOHEXOL 300 MG/ML  SOLN
100.0000 mL | Freq: Once | INTRAMUSCULAR | Status: AC | PRN
  Administered 2021-03-06: 20:00:00 100 mL via INTRAVENOUS

## 2021-03-06 MED ORDER — SUCRALFATE 1 GM/10ML PO SUSP: 1.0000 g | Freq: Three times a day (TID) | ORAL | 0 refills | Status: DC

## 2021-03-06 NOTE — ED Notes (Signed)
Pt verbalizes understanding of discharge instructions. Opportunity for questioning and answers were provided. Pt discharged from ED to home.   ? ?

## 2021-03-06 NOTE — ED Notes (Signed)
Missing dose message sent to pharmacy for Carafate

## 2021-03-06 NOTE — ED Triage Notes (Signed)
Pt reports abdominal pain left of the umbilicus, worse after eating, along with chalky stools and bloating, for 1 week.  Pt took tylenol this morning around 9a with no relief.

## 2021-03-06 NOTE — Discharge Instructions (Addendum)
Year history ofAs we discussed your work-up today was unremarkable, including normal CT scan, normal lab work overall.  Behcet's, as well as prior ulcers in the esophagus and your description of pain directly after eating I have a high suspicion that you have a gastric ulcer.  I recommend that you take the Protonix daily, and you can use the Carafate, and viscous lidocaine to help to coat your stomach.  I recommend follow-up with your gastroenterologist at your earliest convenience that they may need to do a follow-up endoscopy to further evaluate and potentially biopsy any possible gastric ulcers.  Please return if your pain significantly worsens or fails to improve despite treatment.

## 2021-03-06 NOTE — ED Provider Notes (Signed)
[facility] EMERGENCY DEPT Provider Note   CSN: [id number] Arrival date & time: 03/06/21  1749     History Chief Complaint  Patient presents with   Abdominal Pain    [name] [name] is a 40 y.o. male with an extensive past medical history significant for Behcet's disease, cardiac arrest with implanted pacemaker, hypertension who presents with abdominal pain located in the left upper abdomen that is worse after eating that has been ongoing for the last week, and is worse today.  Patient also endorses some bloating.  Patient reports that he had a yellow stool today that was soft in consistency.  Patient denies significant nausea, vomiting.  Patient reports the pain is very significant, 9 or 10 out of 10 right after eating, tolerable at rest.  Patient does have a history of ulcers in the esophagus, and stomach and has had multiple endoscopy evaluations as well as a Nissen fundoplication in the past.  Patient not currently taking any medication for acid reflux including Protonix, Carafate.  Patient denies dark or tarry stools, bright red blood per rectum, blood in vomit.  Patient characterizes the pain as sharp, report that eating makes it worse, rest makes it better.  Patient denies any fever, chills.   Abdominal Pain     Past Medical History:  Diagnosis Date   Anxiety    Behcet's disease ([place])    Bipolar disorder ([place])    Cardiac arrest ([place]) 12/25/2017   one round of CPR    Depression    Hypertension    Migraine    Seizures ([place])    last one 2017   Sleep apnea    CPAP at night   Vitamin D deficiency     Patient Active Problem List   Diagnosis Date Noted   Chest pain with moderate risk for cardiac etiology 11/29/2018   Cardiac arrest ([place]) 12/25/2017   Intubation of airway performed without difficulty    Lactic acidosis    Acute respiratory failure with hypoxia (HCC)    Seizures (HCC)    Post-ictal coma ([place]) 08/03/2016   Acute respiratory failure with  hypoxemia (HCC)    Seizure ([place])    Altered mental status 07/07/2016   Peripheral vertigo 07/17/2015   Vertigo 07/17/2015   Intractable vomiting 07/17/2015   Diplopia    Cellulitis and abscess of trunk-right chest wall 05/08/2013   Dehydration 02/15/2013   Behcet's disease ([place]) 02/15/2013   Recurrent vomiting 02/15/2013   Encounter for therapeutic drug monitoring 06/14/2012   History of oral aphthous ulcers 10/19/2010   BIPOLAR DISORDER UNSPECIFIED 06/18/2008   Essential hypertension 06/18/2008   SYNCOPE AND COLLAPSE 06/18/2008   Epilepsy ([place]) 06/18/2008    Past Surgical History:  Procedure Laterality Date   APPENDECTOMY     CYSTOSCOPY WITH URETHRAL DILATATION N/A 10/15/2019   Procedure: CYSTOSCOPY WITH URETHRAL DILATATION WITH BALLOON;  Surgeon: Lucas Mallow, MD;  [facility];  Service: Urology;  Laterality: N/A;   FRACTURE SURGERY Right    Arm   LEFT HEART CATH AND CORONARY ANGIOGRAPHY N/A 11/29/2018   Procedure: LEFT HEART CATH AND CORONARY ANGIOGRAPHY;  Surgeon: Martinique, Peter M, MD;  Location: [facility];  Service: Cardiovascular;  Laterality: N/A;   maxilofacial     NISSEN FUNDOPLICATION     RADIOLOGY WITH ANESTHESIA N/A 02/13/2019   Procedure: MRI WITH ANESTHESIA OF BRAIN WITH AND WITHOUT CONTRAST;  Surgeon: Radiologist, Medication, MD;  Location: [facility];  Service: Radiology;  Laterality: N/A;  Family History  Problem Relation Age of Onset   Lung cancer Paternal Grandfather    Other Father        BPPV    Social History   Tobacco Use   Smoking status: Never   Smokeless tobacco: Never  Vaping Use   Vaping Use: Never used  Substance Use Topics   Alcohol use: No   Drug use: No    Home Medications Prior to Admission medications   Medication Sig Start Date End Date Taking? Authorizing Provider  lidocaine (XYLOCAINE) 2 % solution Use as directed 15 mLs in the mouth or throat as needed for mouth pain. 03/06/21  Yes Rosalita Carey H,  PA-C  pantoprazole (PROTONIX) 20 MG tablet Take 1 tablet (20 mg total) by mouth daily. 03/06/21  Yes Dewana Ammirati H, PA-C  sucralfate (CARAFATE) 1 GM/10ML suspension Take 10 mLs (1 g total) by mouth 4 (four) times daily -  with meals and at bedtime. 03/06/21  Yes Aadvik Roker H, PA-C  Apremilast 30 MG TABS Take 30 mg by mouth daily.     [provider]  atorvastatin (LIPITOR) 80 MG tablet TAKE 1 TABLET(80 MG) BY MOUTH DAILY 10/13/20   Croitoru, Mihai, MD  clonazePAM (KLONOPIN) 0.5 MG tablet Take 0.25 mg by mouth 3 (three) times daily.    [provider]  FLUoxetine (PROZAC) 20 MG tablet Take 20 mg by mouth 2 (two) times daily. 11/27/16   [provider]  lamoTRIgine 200 MG TBDP Take 1 tablet (200 mg total) by mouth in the morning and at bedtime. 05/20/20   Kathrynn Ducking, MD  levETIRAcetam (KEPPRA) 750 MG tablet Take 1 tablet (750 mg total) by mouth 2 (two) times daily. 11/02/20   Kathrynn Ducking, MD  metoprolol tartrate (LOPRESSOR) 25 MG tablet TAKE 1/2 TABLET BY MOUTH TWICE DAILY 02/08/21   Croitoru, Dani Gobble, MD  mycophenolate (CELLCEPT) 500 MG tablet Take 500-1,000 mg by mouth See admin instructions. Take 1000 mg in the morning and 500 mg at night 03/20/17   [provider]  QUEtiapine (SEROQUEL) 200 MG tablet Take 200 mg by mouth at bedtime.    [provider]  VITAMIN D PO Take by mouth.    [provider]    Allergies    Patient has no known allergies.  Review of Systems   Review of Systems  Gastrointestinal:  Positive for abdominal distention and abdominal pain.  All other systems reviewed and are negative.  Physical Exam Updated Vital Signs BP 130/87   Pulse 72   Temp 98.2 F (36.8 C)   Resp 18   SpO2 100%   Physical Exam Vitals and nursing note reviewed.  Constitutional:      General: He is not in acute distress.    Appearance: Normal appearance.     Comments: Overall well-appearing male who appears to be in  some discomfort sitting on exam table in no acute distress  HENT:     Head: Normocephalic and atraumatic.  Eyes:     General: No scleral icterus.       Right eye: No discharge.        Left eye: No discharge.  Cardiovascular:     Rate and Rhythm: Normal rate and regular rhythm.     Heart sounds: No murmur heard.   No friction rub. No gallop.  Pulmonary:     Effort: Pulmonary effort is normal.     Breath sounds: Normal breath sounds.  Abdominal:  General: Bowel sounds are normal.     Palpations: Abdomen is soft.     Comments: Significant tenderness to palpation in the epigastrium, left upper quadrant without rebound, rigidity or guarding.  Patient with no McBurney point tenderness, no Murphy sign.  No suprapubic tenderness, no CVA tenderness.  Patient with intact bowel sounds throughout.  Skin:    General: Skin is warm and dry.     Capillary Refill: Capillary refill takes less than 2 seconds.  Neurological:     Mental Status: He is alert and oriented to person, place, and time.  Psychiatric:        Mood and Affect: Mood normal.        Behavior: Behavior normal.    ED Results / Procedures / Treatments   Labs (all labs ordered are listed, but only abnormal results are displayed) Labs Reviewed  LIPASE, BLOOD  COMPREHENSIVE METABOLIC PANEL  CBC  URINALYSIS, ROUTINE W REFLEX MICROSCOPIC    EKG None  Radiology CT ABDOMEN PELVIS W CONTRAST  Result Date: 03/06/2021 CLINICAL DATA:  Left upper quadrant pain EXAM: CT ABDOMEN AND PELVIS WITH CONTRAST TECHNIQUE: Multidetector CT imaging of the abdomen and pelvis was performed using the standard protocol following bolus administration of intravenous contrast. CONTRAST:  170mL OMNIPAQUE IOHEXOL 300 MG/ML  SOLN COMPARISON:  None. FINDINGS: LOWER CHEST: Normal. HEPATOBILIARY: Normal hepatic contours. No intra- or extrahepatic biliary dilatation. The gallbladder is normal. PANCREAS: Normal pancreas. No ductal dilatation or peripancreatic  fluid collection. SPLEEN: Normal. ADRENALS/URINARY TRACT: The adrenal glands are normal. No hydronephrosis, nephroureterolithiasis or solid renal mass. The urinary bladder is normal for degree of distention STOMACH/BOWEL: There is no hiatal hernia. Normal duodenal course and caliber. No small bowel dilatation or inflammation. No focal colonic abnormality. Normal appendix. VASCULAR/LYMPHATIC: Normal course and caliber of the major abdominal vessels. No abdominal or pelvic lymphadenopathy. REPRODUCTIVE: Normal prostate size with symmetric seminal vesicles. MUSCULOSKELETAL. No bony spinal canal stenosis or focal osseous abnormality. OTHER: None. IMPRESSION: No acute abnormality of the abdomen or pelvis. Electronically Signed   By: Ulyses Jarred M.D.   On: 03/06/2021 20:29    Procedures Procedures   Medications Ordered in ED Medications  morphine 2 MG/ML injection 2 mg (2 mg Intravenous Given 03/06/21 1938)  alum & mag hydroxide-simeth (MAALOX/MYLANTA) 200-200-20 MG/5ML suspension 30 mL (30 mLs Oral Given 03/06/21 1936)    And  lidocaine (XYLOCAINE) 2 % viscous mouth solution 15 mL (15 mLs Oral Given 03/06/21 1936)  iohexol (OMNIPAQUE) 300 MG/ML solution 100 mL (100 mLs Intravenous Contrast Given 03/06/21 1950)    ED Course  I have reviewed the triage vital signs and the nursing notes.  Pertinent labs & imaging results that were available during my care of the patient were reviewed by me and considered in my medical decision making (see chart for details).    MDM Rules/Calculators/A&P                         Overall well-appearing patient with stable vital signs presents with acute onset abdominal pain over the last week that is worse today.  Patient with a history of ulcers in the past, established relationship with GI.  Based on patient's presentation of epigastric, left upper quadrant pain my differential diagnosis includes peptic ulcer disease, given timing of directly after eating likely gastric  over duodenal.  Other differentials include perforated ulcer, splenic infarction, intestinal obstruction given prior intra-abdominal surgeries, less likely presentation of lower lobar pneumonia, referred  chest pain, nephrolithiasis, pyelonephritis.  Patient's pain is not exertional in nature, not associated with diaphoresis, not located in the chest, does not radiate to the arm to the neck.  Patient without any respiratory symptoms to suggest lower lobar pneumonia including focal consolidation, cough, fever, crackles.  Patient without peritonitic signs.  Minimal clinical suspicion for an acute or surgical abdomen at this time.  Screening lab work performed and is wholly unremarkable including normal CBC, CMP, lipase, urinalysis.  Given that patient had focal tenderness will obtain CT abdomen pelvis.  CT abdomen pelvis without any abnormality.  Patient with minimal improvement of pain with Maalox, viscous lidocaine, Carafate, morphine.  Patient with stable vital signs, continues to be well-appearing at this time, but with increased pain.  Patient is tolerating oral intake.  Patient had a bowel movement this morning, continues to pass gas, and normal CT scan, minimal clinical concern for acute small bowel obstruction.  Given symptoms focally located in the epigastrium, left upper quadrant, and worse after eating clinical suspicion is high for peptic ulcer disease.  Will prescribe Protonix, Carafate, viscous lidocaine and encourage follow-up with gastroenterology for follow-up endoscopy.  Patient understands and agrees to plan, discharged in stable condition at this time.  Return precautions given. Final Clinical Impression(s) / ED Diagnoses Final diagnoses:  Peptic ulcer of stomach, acute    Rx / DC Orders ED Discharge Orders          Ordered    lidocaine (XYLOCAINE) 2 % solution  As needed        03/06/21 2121    sucralfate (CARAFATE) 1 GM/10ML suspension  3 times daily with meals & bedtime         03/06/21 2121    pantoprazole (PROTONIX) 20 MG tablet  Daily        03/06/21 2121             Dorien Chihuahua 03/06/21 2148    Charlesetta Shanks, MD 03/11/21 1547

## 2021-03-09 LAB — CUP PACEART REMOTE DEVICE CHECK
Date Time Interrogation Session: 20221204231122
Implantable Pulse Generator Implant Date: 20210512

## 2021-03-14 ENCOUNTER — Ambulatory Visit (INDEPENDENT_AMBULATORY_CARE_PROVIDER_SITE_OTHER): Payer: Medicare Other

## 2021-03-14 DIAGNOSIS — I469 Cardiac arrest, cause unspecified: Secondary | ICD-10-CM | POA: Diagnosis not present

## 2021-03-22 NOTE — Progress Notes (Signed)
Carelink Summary Report / Loop Recorder 

## 2021-04-18 ENCOUNTER — Ambulatory Visit (INDEPENDENT_AMBULATORY_CARE_PROVIDER_SITE_OTHER): Payer: Medicare Other

## 2021-04-18 DIAGNOSIS — I469 Cardiac arrest, cause unspecified: Secondary | ICD-10-CM

## 2021-04-18 LAB — CUP PACEART REMOTE DEVICE CHECK
Date Time Interrogation Session: 20230115230830
Implantable Pulse Generator Implant Date: 20210512

## 2021-04-28 ENCOUNTER — Other Ambulatory Visit: Payer: Self-pay

## 2021-04-28 DIAGNOSIS — R569 Unspecified convulsions: Secondary | ICD-10-CM

## 2021-04-28 MED ORDER — LEVETIRACETAM 750 MG PO TABS: 750.0000 mg | ORAL_TABLET | Freq: Two times a day (BID) | ORAL | 1 refills | Status: DC

## 2021-04-28 NOTE — Progress Notes (Signed)
Carelink Summary Report / Loop Recorder 

## 2021-05-23 ENCOUNTER — Ambulatory Visit (INDEPENDENT_AMBULATORY_CARE_PROVIDER_SITE_OTHER): Payer: Medicare Other

## 2021-05-23 DIAGNOSIS — I469 Cardiac arrest, cause unspecified: Secondary | ICD-10-CM

## 2021-05-23 LAB — CUP PACEART REMOTE DEVICE CHECK
Date Time Interrogation Session: 20230219231027
Implantable Pulse Generator Implant Date: 20210512

## 2021-05-27 ENCOUNTER — Encounter: Payer: Self-pay | Admitting: Neurology

## 2021-05-27 NOTE — Progress Notes (Signed)
Carelink Summary Report / Loop Recorder 

## 2021-05-30 ENCOUNTER — Other Ambulatory Visit: Payer: Self-pay | Admitting: *Deleted

## 2021-05-30 MED ORDER — LAMOTRIGINE 200 MG PO TBDP: 200.0000 mg | ORAL_TABLET | Freq: Two times a day (BID) | ORAL | 3 refills | Status: DC

## 2021-06-01 ENCOUNTER — Ambulatory Visit: Payer: Medicare Other | Admitting: Neurology

## 2021-06-27 ENCOUNTER — Ambulatory Visit (INDEPENDENT_AMBULATORY_CARE_PROVIDER_SITE_OTHER): Payer: Medicare Other

## 2021-06-27 DIAGNOSIS — R55 Syncope and collapse: Secondary | ICD-10-CM

## 2021-06-28 LAB — CUP PACEART REMOTE DEVICE CHECK
Date Time Interrogation Session: 20230326231348
Implantable Pulse Generator Implant Date: 20210512

## 2021-07-07 NOTE — Progress Notes (Signed)
Carelink Summary Report / Loop Recorder 

## 2021-07-16 IMAGING — CT CT RENAL STONE PROTOCOL
2 of 4 series · 16 of 46 positions shown, 18 images · non-contrast
Comparison: CT abdomen pelvis 12/25/2017

CLINICAL DATA: Patient reports that he has been having mid lower
back pain that radiates into the left leg x 4 days. Patient states
the pain is worse when he sits or lays. Patient also reported that
he had blood in the toilet when he urinated today.

EXAM:
CT ABDOMEN AND PELVIS WITHOUT CONTRAST
TECHNIQUE: Multidetector CT imaging of the abdomen and pelvis was performed
following the standard protocol without IV contrast.

[Series 2: axial st · axial · 0.79mm/px · z∈[+1147,+1602]mm · 13 of 103 slices shown, 15 images]
[im 6/103  soft-tissue]
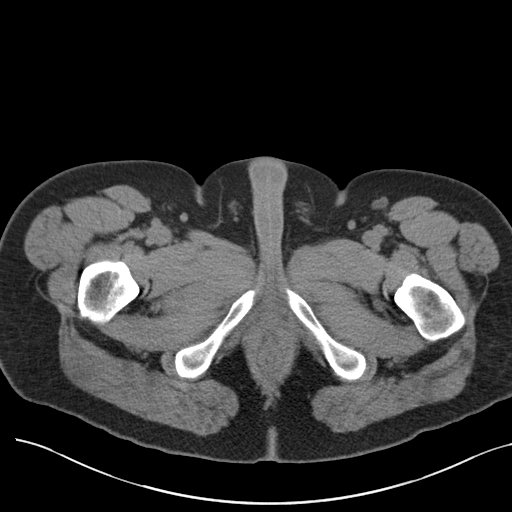
[im 6/103  bone]
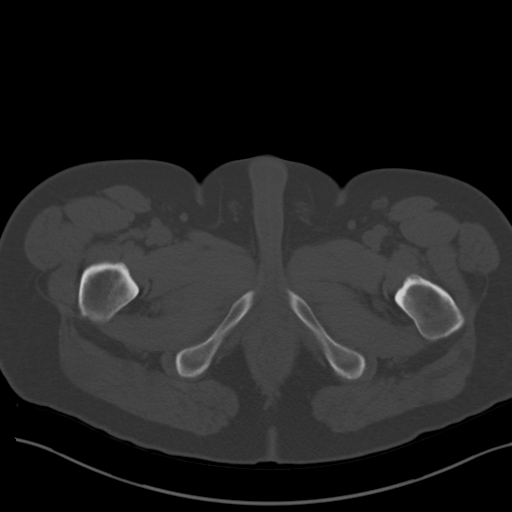
[im 17/103  soft-tissue]
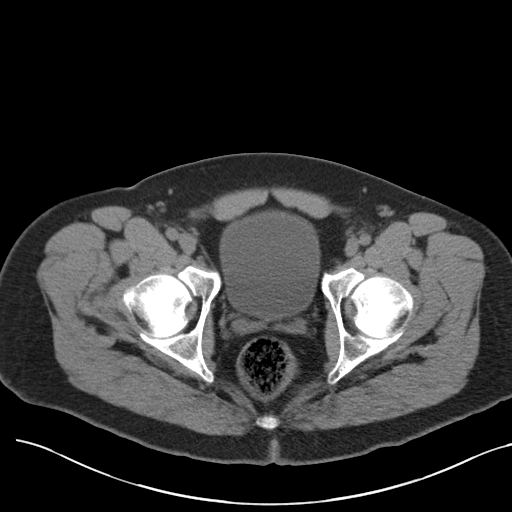
[im 22/103  soft-tissue]
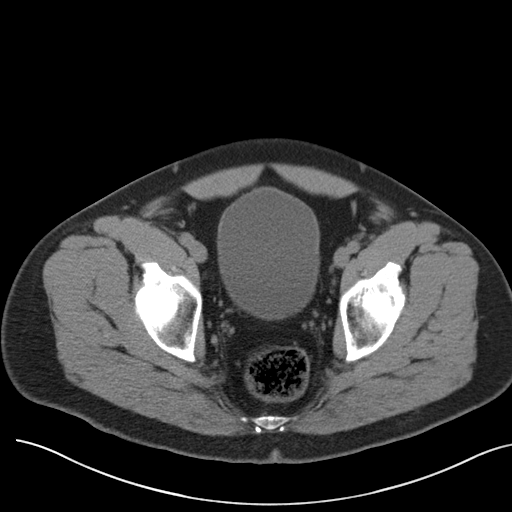
[im 27/103  soft-tissue]
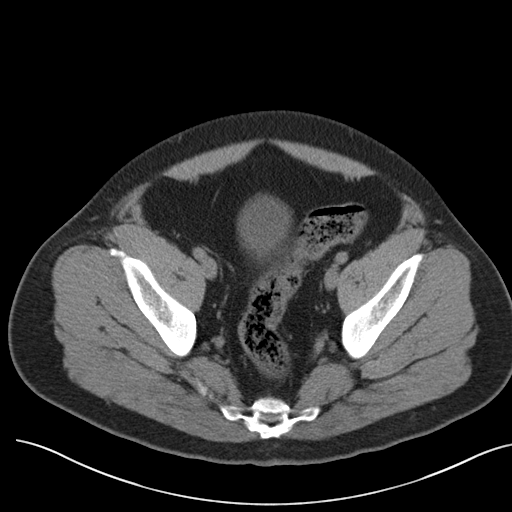
[im 38/103  soft-tissue]
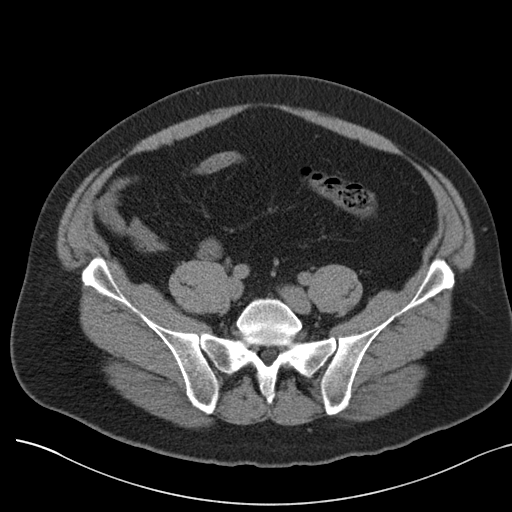
[im 43/103  soft-tissue]
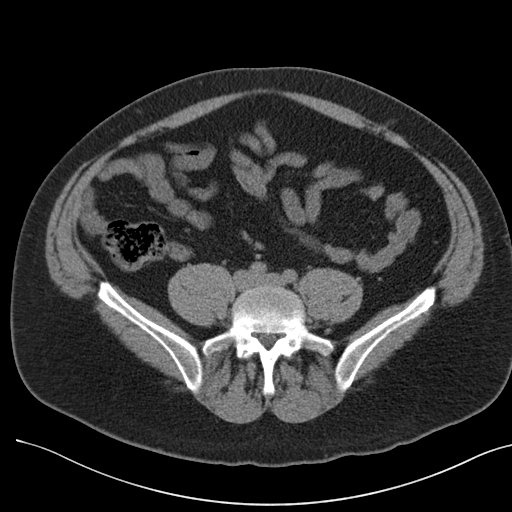
[im 54/103  soft-tissue]
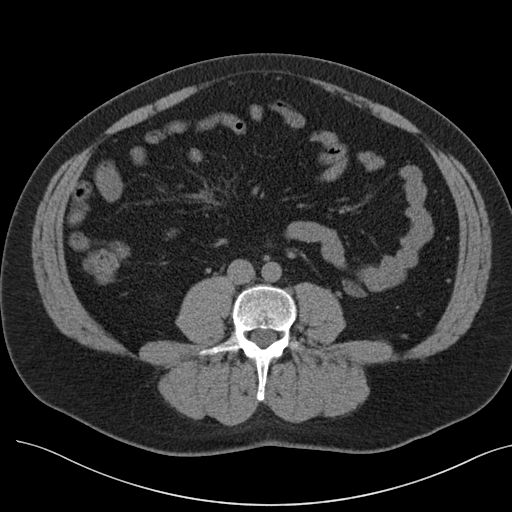
[im 60/103  soft-tissue]
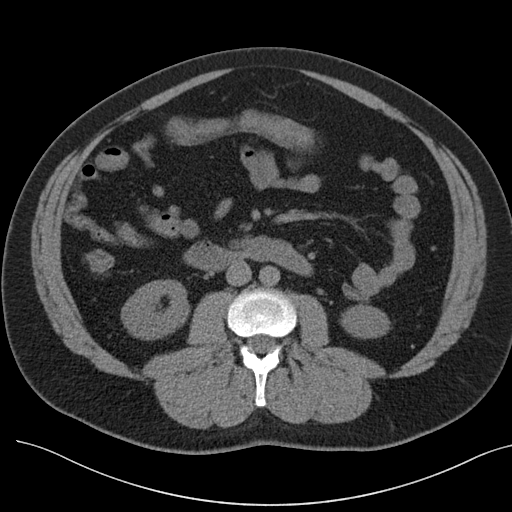
[im 65/103  soft-tissue]
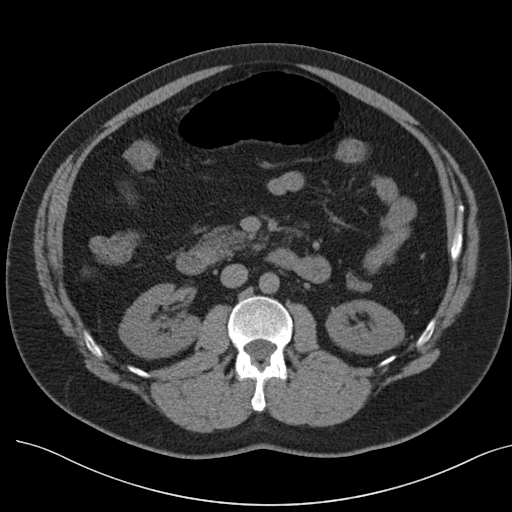
[im 65/103  bone]
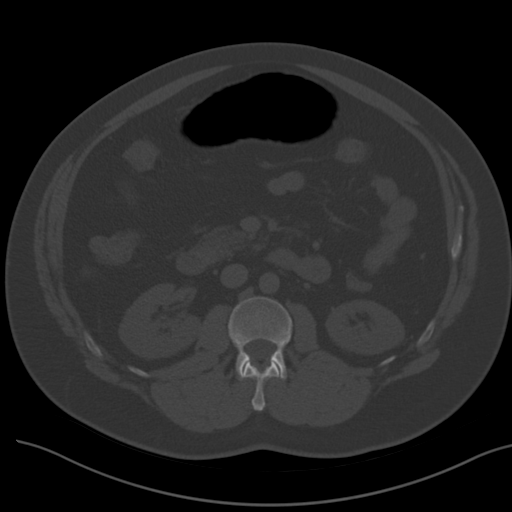
[im 76/103  soft-tissue]
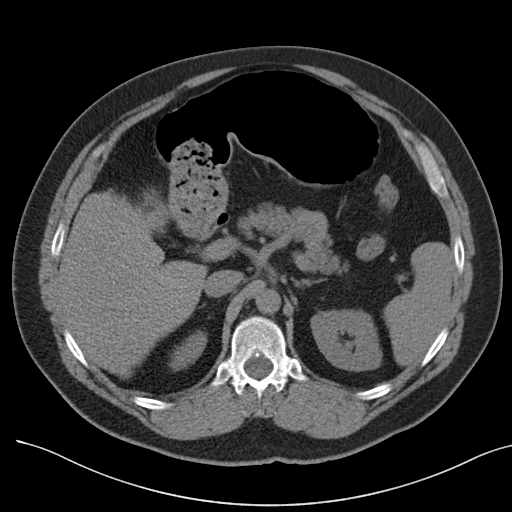
[im 81/103  soft-tissue]
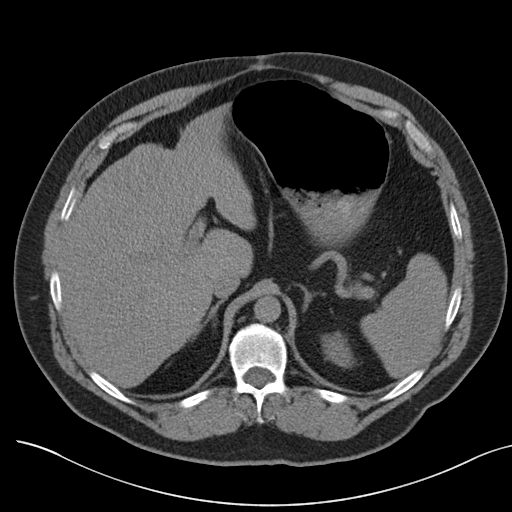
[im 86/103  soft-tissue]
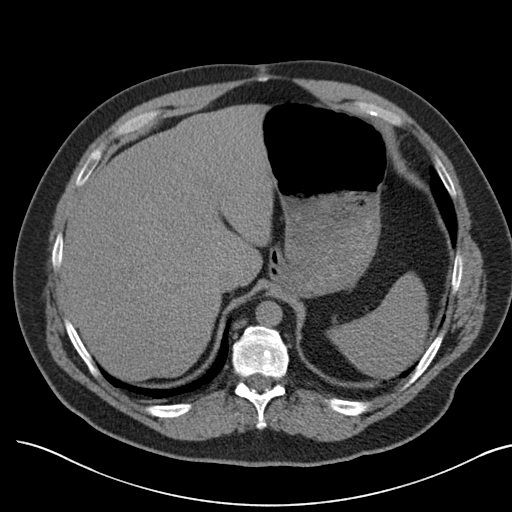
[im 97/103  soft-tissue]
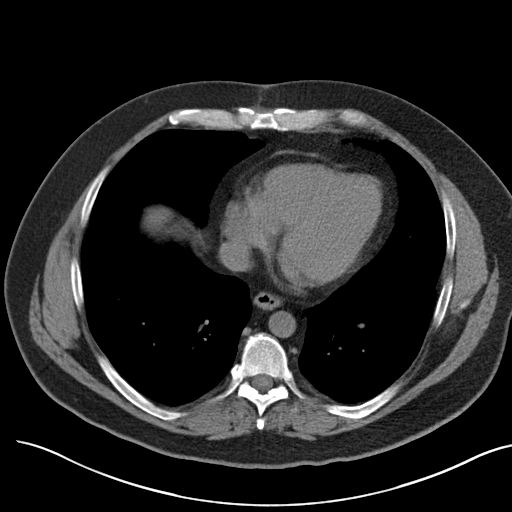

[Series 5: coronal · coronal · 0.75mm/px · 3 of 154 slices shown]
[im 52/154  soft-tissue]
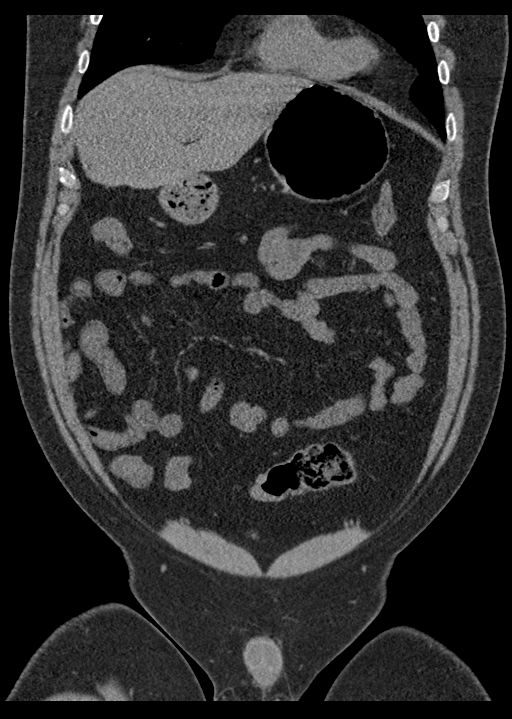
[im 69/154  soft-tissue]
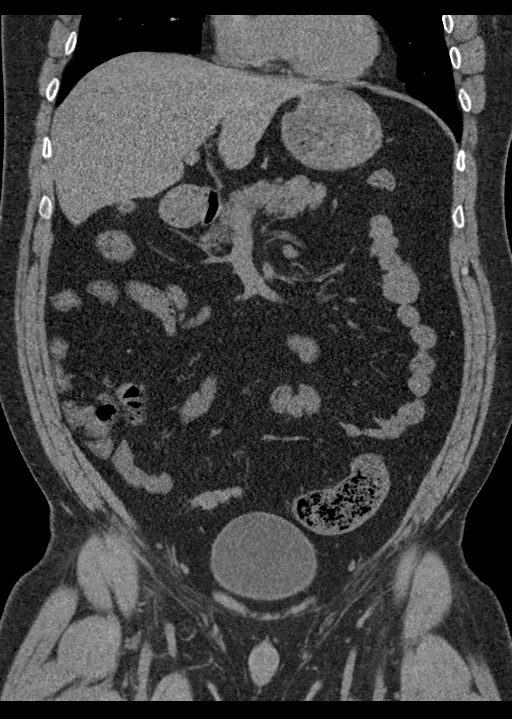
[im 86/154  soft-tissue]
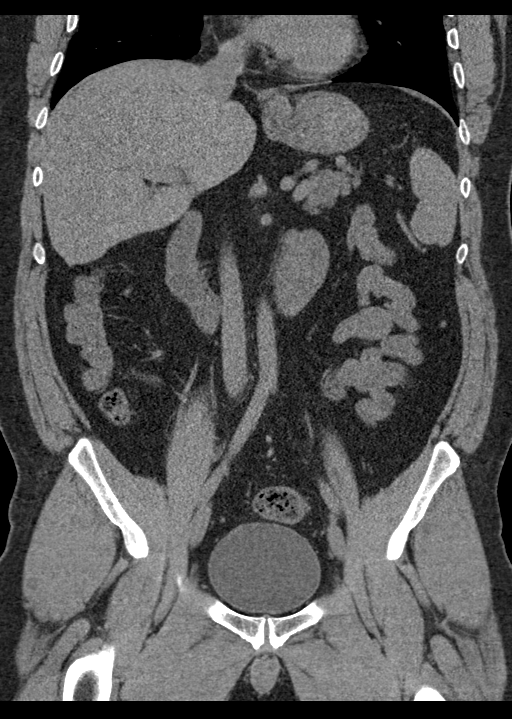

[16 of 46 positions shown; findings below may reference images not displayed]

FINDINGS: Lower chest: No acute abnormality.

Somewhat limited evaluation of the abdominal parenchyma in the
absence of IV contrast.

Hepatobiliary: No focal liver abnormality is seen. No gallstones,
gallbladder wall thickening, or biliary dilatation.

Pancreas: Unremarkable.

Spleen: Normal in size without focal abnormality.

Adrenals/Urinary Tract: Adrenal glands are unremarkable. Kidneys are
symmetric in size. No hydronephrosis no renal calculi identified.
Bladder is unremarkable.

Stomach/Bowel: Stomach is within normal limits. Appendix is
surgically absent. No evidence of bowel wall thickening, distention,
or inflammatory changes.

Vascular/Lymphatic: No significant vascular finding. No
lymphadenopathy.

Reproductive: Prostate is unremarkable.

Other: No abdominal wall hernia or abnormality. No abdominopelvic
ascites.

Musculoskeletal: No acute or significant osseous findings.
IMPRESSION: No evidence of renal calculi. No evidence of acute intra-abdominal
pathology on a noncontrast CT.

## 2021-07-31 LAB — CUP PACEART REMOTE DEVICE CHECK
Date Time Interrogation Session: 20230428230528
Implantable Pulse Generator Implant Date: 20210512

## 2021-08-01 ENCOUNTER — Ambulatory Visit (INDEPENDENT_AMBULATORY_CARE_PROVIDER_SITE_OTHER): Payer: Medicare Other

## 2021-08-01 DIAGNOSIS — R55 Syncope and collapse: Secondary | ICD-10-CM

## 2021-08-05 ENCOUNTER — Other Ambulatory Visit: Payer: Self-pay

## 2021-08-05 ENCOUNTER — Encounter (HOSPITAL_BASED_OUTPATIENT_CLINIC_OR_DEPARTMENT_OTHER): Payer: Self-pay

## 2021-08-05 ENCOUNTER — Emergency Department (HOSPITAL_BASED_OUTPATIENT_CLINIC_OR_DEPARTMENT_OTHER)
Admission: EM | Admit: 2021-08-05 | Discharge: 2021-08-06 | Disposition: A | Discharge status: Home / Self Care | Payer: Medicare Other | Attending: Emergency Medicine | Admitting: Emergency Medicine

## 2021-08-05 DIAGNOSIS — I10 Essential (primary) hypertension: Secondary | ICD-10-CM | POA: Insufficient documentation

## 2021-08-05 DIAGNOSIS — L02212 Cutaneous abscess of back [any part, except buttock]: Secondary | ICD-10-CM | POA: Diagnosis not present

## 2021-08-05 DIAGNOSIS — Z79899 Other long term (current) drug therapy: Secondary | ICD-10-CM | POA: Diagnosis not present

## 2021-08-05 DIAGNOSIS — L0291 Cutaneous abscess, unspecified: Secondary | ICD-10-CM

## 2021-08-05 MED ORDER — DOXYCYCLINE HYCLATE 100 MG PO CAPS: 100.0000 mg | ORAL_CAPSULE | Freq: Two times a day (BID) | ORAL | 0 refills | Status: DC

## 2021-08-05 MED ORDER — LIDOCAINE-EPINEPHRINE (PF) 2 %-1:200000 IJ SOLN
20.0000 mL | Freq: Once | INTRAMUSCULAR | Status: AC
  Administered 2021-08-05: 20 mL
  Filled 2021-08-05: qty 20

## 2021-08-05 MED ORDER — OXYCODONE-ACETAMINOPHEN 5-325 MG PO TABS
1.0000 | ORAL_TABLET | Freq: Once | ORAL | Status: AC
  Administered 2021-08-05: 1 via ORAL
  Filled 2021-08-05: qty 1

## 2021-08-05 NOTE — ED Provider Notes (Signed)
?[facility] EMERGENCY DEPT ?Provider Note ? ? ?CSN: [id number] ?Arrival date & time: 08/05/21  2009 ? ?  ? ?History ? ?Chief Complaint  ?Patient presents with  ? Abscess  ? ? ?[name] [name] is a 41 y.o. male. ? ?HPI ? ?  ? ?This is a 41 year old male with a history of hypertension, Behcet's disease who presents with abscess.  Patient reports 1 day history of lower back pain.  He noted pain and swelling of the lower back consistent with an abscess.  He believes that this is infected Bechet's lesion.  He has had 1 before.  He has not had any fevers.  States that he generally does not feel well but does not have any systemic symptoms.  Has not noted any drainage. ? ?Home Medications ?Prior to Admission medications   ?Medication Sig Start Date End Date Taking? Authorizing Provider  ?doxycycline (VIBRAMYCIN) 100 MG capsule Take 1 capsule (100 mg total) by mouth 2 (two) times daily. 08/05/21  Yes Maksymilian Mabey, Barbette Hair, MD  ?Apremilast 30 MG TABS Take 30 mg by mouth daily.     [provider]  ?atorvastatin (LIPITOR) 80 MG tablet TAKE 1 TABLET(80 MG) BY MOUTH DAILY 10/13/20   Croitoru, Mihai, MD  ?clonazePAM (KLONOPIN) 0.5 MG tablet Take 0.25 mg by mouth 3 (three) times daily.    [provider]  ?FLUoxetine (PROZAC) 20 MG tablet Take 20 mg by mouth 2 (two) times daily. 11/27/16   [provider]  ?lamoTRIgine 200 MG TBDP Take 1 tablet (200 mg total) by mouth in the morning and at bedtime. 05/30/21   Alric Ran, MD  ?levETIRAcetam (KEPPRA) 750 MG tablet Take 1 tablet (750 mg total) by mouth 2 (two) times daily. 04/28/21   Alric Ran, MD  ?lidocaine (XYLOCAINE) 2 % solution Use as directed 15 mLs in the mouth or throat as needed for mouth pain. 03/06/21   Prosperi, Christian H, PA-C  ?metoprolol tartrate (LOPRESSOR) 25 MG tablet TAKE 1/2 TABLET BY MOUTH TWICE DAILY 02/08/21   Croitoru, Mihai, MD  ?mycophenolate (CELLCEPT) 500 MG tablet Take 500-1,000 mg by mouth See admin instructions.  Take 1000 mg in the morning and 500 mg at night 03/20/17   [provider]  ?pantoprazole (PROTONIX) 20 MG tablet Take 1 tablet (20 mg total) by mouth daily. 03/06/21   Prosperi, Christian H, PA-C  ?QUEtiapine (SEROQUEL) 200 MG tablet Take 200 mg by mouth at bedtime.    [provider]  ?sucralfate (CARAFATE) 1 GM/10ML suspension Take 10 mLs (1 g total) by mouth 4 (four) times daily -  with meals and at bedtime. 03/06/21   Prosperi, Christian H, PA-C  ?VITAMIN D PO Take by mouth.    [provider]  ?   ? ?Allergies    ?Patient has no known allergies.   ? ?Review of Systems   ?Review of Systems  ?Skin:   ?     Abscess  ?All other systems reviewed and are negative. ? ?Physical Exam ?Updated Vital Signs ?BP 132/88   Pulse 80   Temp 98.3 ?F (36.8 ?C)   Resp 18   Ht 1.854 m ('6\' 1"'$ )   Wt 104.3 kg   SpO2 100%   BMI 30.34 kg/m?  ?Physical Exam ?Vitals and nursing note reviewed.  ?Constitutional:   ?   Appearance: He is well-developed. He is obese. He is not ill-appearing.  ?HENT:  ?   Head: Normocephalic and atraumatic.  ?Eyes:  ?   Pupils:  Pupils are equal, round, and reactive to light.  ?Cardiovascular:  ?   Rate and Rhythm: Normal rate and regular rhythm.  ?Pulmonary:  ?   Effort: Pulmonary effort is normal. No respiratory distress.  ?Abdominal:  ?   Palpations: Abdomen is soft.  ?   Tenderness: There is no abdominal tenderness.  ?Musculoskeletal:  ?   Cervical back: Neck supple.  ?Lymphadenopathy:  ?   Cervical: No cervical adenopathy.  ?Skin: ?   General: Skin is warm and dry.  ?   Comments: 3 x 3 cm area of induration and fluctuance just left of midline over the lower back, slight erythema, no drainage noted, tenderness  ?Neurological:  ?   Mental Status: He is alert and oriented to person, place, and time.  ?Psychiatric:     ?   Mood and Affect: Mood normal.  ? ? ?ED Results / Procedures / Treatments   ?Labs ?(all labs ordered are listed, but only abnormal results are displayed) ?Labs  Reviewed - No data to display ? ?EKG ?None ? ?Radiology ?No results found. ? ?Procedures ?Marland Kitchen.Incision and Drainage ? ?Date/Time: 08/05/2021 11:40 PM ?Performed by: Merryl Hacker, MD ?Authorized by: Merryl Hacker, MD  ? ?Consent:  ?  Consent obtained:  Verbal ?  Consent given by:  Patient ?  Risks, benefits, and alternatives were discussed: yes   ?  Risks discussed:  Bleeding, incomplete drainage and infection ?  Alternatives discussed:  No treatment ?Location:  ?  Type:  Abscess ?  Size:  3x3 cm ?  Location:  Trunk ?  Trunk location:  Back ?Pre-procedure details:  ?  Skin preparation:  Povidone-iodine ?Sedation:  ?  Sedation type:  None ?Anesthesia:  ?  Anesthesia method:  None ?Procedure type:  ?  Complexity:  Simple ?Procedure details:  ?  Ultrasound guidance: no   ?  Needle aspiration: no   ?  Incision types:  Stab incision ?  Incision depth:  Dermal ?  Wound management:  Probed and deloculated ?  Drainage:  Purulent ?  Drainage amount:  Moderate ?  Packing materials:  1/2 in iodoform gauze ?Post-procedure details:  ?  Procedure completion:  Tolerated  ? ? ?Medications Ordered in ED ?Medications  ?oxyCODONE-acetaminophen (PERCOCET/ROXICET) 5-325 MG per tablet 1 tablet (1 tablet Oral Given 08/05/21 2303)  ?lidocaine-EPINEPHrine (XYLOCAINE W/EPI) 2 %-1:200000 (PF) injection 20 mL (20 mLs Infiltration Given 08/05/21 2304)  ? ? ?ED Course/ Medical Decision Making/ A&P ?  ?                        ?Medical Decision Making ?Risk ?Prescription drug management. ? ? ?This patient presents to the ED for concern of abscess, this involves an extensive number of treatment options, and is a complaint that carries with it a high risk of complications and morbidity.  The differential diagnosis includes abscess, cellulitis ? ?MDM:   ? ?This a 41 year old male who presents with an abscess to the lower back.  Slight erythema but not overtly infected.  He is systemically well-appearing.  Vital signs reviewed and are reassuring.   No imaging indicated.  Abscess was drained at the bedside.  Packing placed in a dressing was provided.  Do not feel antibiotics are indicated at this time; however, did provide him with a prescription.  If redness increases or he becomes febrile, would recommend that he start antibiotics. ?(Labs, imaging) ? ?Labs: ?I Ordered, and personally interpreted labs.  The  pertinent results include:  none ? ?Imaging Studies ordered: ?I ordered imaging studies including none ?I independently visualized and interpreted imaging. ?I agree with the radiologist interpretation ? ?Additional history obtained from chart review.  External records from outside source obtained and reviewed including prior evaluations ? ?Critical Interventions: ?Incision and drainage ? ?Consultations: ?I requested consultation with the none,  and discussed lab and imaging findings as well as pertinent plan - they recommend: N/A ? ?Cardiac Monitoring: ?The patient was maintained on a cardiac monitor.  I personally viewed and interpreted the cardiac monitored which showed an underlying rhythm of: Sinus rhythm ? ?Reevaluation: ?After the interventions noted above, I reevaluated the patient and found that they have :improved ? ? ?Considered admission for: Infection, cellulitis ? ?Social Determinants of Health: ?Lives independently ? ?Disposition: Discharge ? ?Co morbidities that complicate the patient evaluation ? ?Past Medical History:  ?Diagnosis Date  ? Anxiety   ? Behcet's disease ([place])   ? Bipolar disorder ([place])   ? Cardiac arrest ([place]) 12/25/2017  ? one round of CPR   ? Depression   ? Hypertension   ? Migraine   ? Seizures ([place])   ? last one 2017  ? Sleep apnea   ? CPAP at night  ? Vitamin D deficiency   ?  ? ?Medicines ?Meds ordered this encounter  ?Medications  ? oxyCODONE-acetaminophen (PERCOCET/ROXICET) 5-325 MG per tablet 1 tablet  ? lidocaine-EPINEPHrine (XYLOCAINE W/EPI) 2 %-1:200000 (PF) injection 20 mL  ? doxycycline (VIBRAMYCIN) 100 MG capsule   ?  Sig: Take 1 capsule (100 mg total) by mouth 2 (two) times daily.  ?  Dispense:  14 capsule  ?  Refill:  0  ?  ?I have reviewed the patients home medicines and have made adjustments as needed ? ?Problem Lis

## 2021-08-05 NOTE — ED Triage Notes (Signed)
Patient here POV from Home. ? ?Endorses Abscess to Mid/Lower Back above the Buttock. History of Same and Flare Up began yesterday and has worsened since. ? ?No Known Fevers. ? ?NAD Noted during Triage. A&Ox4. GCS 15. Ambulatory. ?

## 2021-08-05 NOTE — Discharge Instructions (Signed)
You were seen today for a skin abscess.  Keep packing in place for 2 days.  If redness worsens or you develop fever, start antibiotics. ?

## 2021-08-16 NOTE — Progress Notes (Signed)
Carelink Summary Report / Loop Recorder 

## 2021-09-05 ENCOUNTER — Ambulatory Visit (INDEPENDENT_AMBULATORY_CARE_PROVIDER_SITE_OTHER): Payer: Medicare Other

## 2021-09-05 DIAGNOSIS — R55 Syncope and collapse: Secondary | ICD-10-CM | POA: Diagnosis not present

## 2021-09-05 LAB — CUP PACEART REMOTE DEVICE CHECK
Date Time Interrogation Session: 20230531230655
Implantable Pulse Generator Implant Date: 20210512

## 2021-09-21 NOTE — Progress Notes (Signed)
Carelink Summary Report / Loop Recorder 

## 2021-10-03 ENCOUNTER — Inpatient Hospital Stay (HOSPITAL_BASED_OUTPATIENT_CLINIC_OR_DEPARTMENT_OTHER)
Admission: EM | Admit: 2021-10-03 | Discharge: 2021-10-08 | DRG: 123 | Disposition: A | Discharge status: Home / Self Care | Payer: Medicare Other | Attending: Family Medicine | Admitting: Family Medicine

## 2021-10-03 ENCOUNTER — Other Ambulatory Visit: Payer: Self-pay

## 2021-10-03 DIAGNOSIS — H53411 Scotoma involving central area, right eye: Secondary | ICD-10-CM | POA: Diagnosis present

## 2021-10-03 DIAGNOSIS — G4733 Obstructive sleep apnea (adult) (pediatric): Secondary | ICD-10-CM | POA: Diagnosis present

## 2021-10-03 DIAGNOSIS — H5461 Unqualified visual loss, right eye, normal vision left eye: Secondary | ICD-10-CM | POA: Diagnosis not present

## 2021-10-03 DIAGNOSIS — D3132 Benign neoplasm of left choroid: Secondary | ICD-10-CM | POA: Diagnosis present

## 2021-10-03 DIAGNOSIS — Z8674 Personal history of sudden cardiac arrest: Secondary | ICD-10-CM

## 2021-10-03 DIAGNOSIS — R569 Unspecified convulsions: Secondary | ICD-10-CM

## 2021-10-03 DIAGNOSIS — K5900 Constipation, unspecified: Secondary | ICD-10-CM | POA: Diagnosis present

## 2021-10-03 DIAGNOSIS — G40909 Epilepsy, unspecified, not intractable, without status epilepticus: Secondary | ICD-10-CM | POA: Diagnosis present

## 2021-10-03 DIAGNOSIS — I10 Essential (primary) hypertension: Secondary | ICD-10-CM | POA: Diagnosis present

## 2021-10-03 DIAGNOSIS — F319 Bipolar disorder, unspecified: Secondary | ICD-10-CM | POA: Diagnosis present

## 2021-10-03 DIAGNOSIS — F418 Other specified anxiety disorders: Secondary | ICD-10-CM | POA: Diagnosis present

## 2021-10-03 DIAGNOSIS — M352 Behcet's disease: Secondary | ICD-10-CM | POA: Diagnosis present

## 2021-10-03 DIAGNOSIS — H5711 Ocular pain, right eye: Principal | ICD-10-CM | POA: Diagnosis present

## 2021-10-03 DIAGNOSIS — Z79899 Other long term (current) drug therapy: Secondary | ICD-10-CM

## 2021-10-03 DIAGNOSIS — Z801 Family history of malignant neoplasm of trachea, bronchus and lung: Secondary | ICD-10-CM

## 2021-10-03 DIAGNOSIS — Z79624 Long term (current) use of inhibitors of nucleotide synthesis: Secondary | ICD-10-CM

## 2021-10-03 DIAGNOSIS — H469 Unspecified optic neuritis: Principal | ICD-10-CM | POA: Diagnosis present

## 2021-10-03 DIAGNOSIS — F419 Anxiety disorder, unspecified: Secondary | ICD-10-CM | POA: Diagnosis present

## 2021-10-03 LAB — CBC WITH DIFFERENTIAL/PLATELET
Abs Immature Granulocytes: 0.03 10*3/uL (ref 0.00–0.07)
Basophils Absolute: 0 10*3/uL (ref 0.0–0.1)
Basophils Relative: 0 %
Eosinophils Absolute: 0.2 10*3/uL (ref 0.0–0.5)
Eosinophils Relative: 2 %
HCT: 43.4 % (ref 39.0–52.0)
Hemoglobin: 14.4 g/dL (ref 13.0–17.0)
Immature Granulocytes: 0 %
Lymphocytes Relative: 24 %
Lymphs Abs: 1.9 10*3/uL (ref 0.7–4.0)
MCH: 30.7 pg (ref 26.0–34.0)
MCHC: 33.2 g/dL (ref 30.0–36.0)
MCV: 92.5 fL (ref 80.0–100.0)
Monocytes Absolute: 0.7 10*3/uL (ref 0.1–1.0)
Monocytes Relative: 8 %
Neutro Abs: 5.3 10*3/uL (ref 1.7–7.7)
Neutrophils Relative %: 66 %
Platelets: 204 10*3/uL (ref 150–400)
RBC: 4.69 MIL/uL (ref 4.22–5.81)
RDW: 12.2 % (ref 11.5–15.5)
WBC: 8.1 10*3/uL (ref 4.0–10.5)
nRBC: 0 % (ref 0.0–0.2)

## 2021-10-03 MED ORDER — PROCHLORPERAZINE EDISYLATE 10 MG/2ML IJ SOLN
10.0000 mg | Freq: Once | INTRAMUSCULAR | Status: AC
Start: 2021-10-03 — End: 2021-10-03
  Administered 2021-10-03: 10 mg via INTRAVENOUS
  Filled 2021-10-03: qty 2

## 2021-10-03 MED ORDER — KETOROLAC TROMETHAMINE 60 MG/2ML IM SOLN: 30.0000 mg | Freq: Once | INTRAMUSCULAR | Status: DC

## 2021-10-03 MED ORDER — PROCHLORPERAZINE EDISYLATE 10 MG/2ML IJ SOLN: 10.0000 mg | Freq: Once | INTRAMUSCULAR | Status: DC

## 2021-10-03 MED ORDER — SODIUM CHLORIDE 0.9 % IV BOLUS
1000.0000 mL | Freq: Once | INTRAVENOUS | Status: AC
  Administered 2021-10-03: 1000 mL via INTRAVENOUS

## 2021-10-03 NOTE — ED Provider Notes (Signed)
[facility] EMERGENCY DEPT Provider Note  CSN: [id number] Arrival date & time: 10/03/21 2002  Chief Complaint(s) Eye Problem (Right)  HPI [name] [name] is a 41 y.o. male with a past medical history listed below including bipolar disease, Behcet's disease, migraines, seizures who presents with right eye pain and vision loss.   Eye Problem Location:  Right eye Quality:  Dull Severity:  Moderate Onset quality:  Gradual Duration:  2 days Timing:  Constant Progression:  Worsening Chronicity:  New Context: not burn, not chemical exposure, not contact lens problem, not direct trauma, not foreign body, not using machinery, not scratch, not smoke exposure and not UV exposure   Relieved by:  Nothing Worsened by:  Nothing Associated symptoms: decreased vision (central vision loss on right eye only)   Associated symptoms: no blurred vision, no discharge, no double vision, no facial rash, no nausea, no numbness, no redness, no tingling and no vomiting     Past Medical History Past Medical History:  Diagnosis Date   Anxiety    Behcet's disease ([place])    Bipolar disorder ([place])    Cardiac arrest ([place]) 12/25/2017   one round of CPR    Depression    Hypertension    Migraine    Seizures ([place])    last one 2017   Sleep apnea    CPAP at night   Vitamin D deficiency    Patient Active Problem List   Diagnosis Date Noted   Chest pain with moderate risk for cardiac etiology 11/29/2018   Cardiac arrest ([place]) 12/25/2017   Intubation of airway performed without difficulty    Lactic acidosis    Acute respiratory failure with hypoxia (HCC)    Seizures (HCC)    Post-ictal coma ([place]) 08/03/2016   Acute respiratory failure with hypoxemia (HCC)    Seizure (HCC)    Altered mental status 07/07/2016   Peripheral vertigo 07/17/2015   Vertigo 07/17/2015   Intractable vomiting 07/17/2015   Diplopia    Cellulitis and abscess of trunk-right chest wall 05/08/2013   Dehydration 02/15/2013    Behcet's disease ([place]) 02/15/2013   Recurrent vomiting 02/15/2013   Encounter for therapeutic drug monitoring 06/14/2012   History of oral aphthous ulcers 10/19/2010   BIPOLAR DISORDER UNSPECIFIED 06/18/2008   Essential hypertension 06/18/2008   SYNCOPE AND COLLAPSE 06/18/2008   Epilepsy ([place]) 06/18/2008   Home Medication(s) Prior to Admission medications   Medication Sig Start Date End Date Taking? Authorizing Provider  Apremilast 30 MG TABS Take 30 mg by mouth daily.     [provider]  atorvastatin (LIPITOR) 80 MG tablet TAKE 1 TABLET(80 MG) BY MOUTH DAILY 10/13/20   Croitoru, Mihai, MD  clonazePAM (KLONOPIN) 0.5 MG tablet Take 0.25 mg by mouth 3 (three) times daily.    [provider]  doxycycline (VIBRAMYCIN) 100 MG capsule Take 1 capsule (100 mg total) by mouth 2 (two) times daily. 08/05/21   Horton, Barbette Hair, MD  FLUoxetine (PROZAC) 20 MG tablet Take 20 mg by mouth 2 (two) times daily. 11/27/16   [provider]  lamoTRIgine 200 MG TBDP Take 1 tablet (200 mg total) by mouth in the morning and at bedtime. 05/30/21   Alric Ran, MD  levETIRAcetam (KEPPRA) 750 MG tablet Take 1 tablet (750 mg total) by mouth 2 (two) times daily. 04/28/21   Alric Ran, MD  lidocaine (XYLOCAINE) 2 % solution Use as directed 15 mLs in the mouth or throat as needed for mouth pain. 03/06/21  Prosperi, Christian H, PA-C  metoprolol tartrate (LOPRESSOR) 25 MG tablet TAKE 1/2 TABLET BY MOUTH TWICE DAILY 02/08/21   Croitoru, Mihai, MD  mycophenolate (CELLCEPT) 500 MG tablet Take 500-1,000 mg by mouth See admin instructions. Take 1000 mg in the morning and 500 mg at night 03/20/17   [provider]  pantoprazole (PROTONIX) 20 MG tablet Take 1 tablet (20 mg total) by mouth daily. 03/06/21   Prosperi, Christian H, PA-C  QUEtiapine (SEROQUEL) 200 MG tablet Take 200 mg by mouth at bedtime.    [provider]  sucralfate (CARAFATE) 1 GM/10ML suspension Take 10 mLs (1 g  total) by mouth 4 (four) times daily -  with meals and at bedtime. 03/06/21   Prosperi, Christian H, PA-C  VITAMIN D PO Take by mouth.    [provider]                                                                                                                                    Allergies Patient has no known allergies.  Review of Systems Review of Systems  Eyes:  Negative for blurred vision, double vision, discharge and redness.  Gastrointestinal:  Negative for nausea and vomiting.  Neurological:  Negative for tingling and numbness.   As noted in HPI  Physical Exam Vital Signs  I have reviewed the triage vital signs BP 115/78   Pulse 69   Temp 98 F (36.7 C) (Oral)   Resp 18   Ht '6\' 1"'$  (1.854 m)   Wt 97.5 kg   SpO2 100%   BMI 28.37 kg/m   Physical Exam Vitals reviewed.  Constitutional:      General: He is not in acute distress.    Appearance: He is well-developed. He is not diaphoretic.  HENT:     Head: Normocephalic and atraumatic.     Right Ear: External ear normal.     Left Ear: External ear normal.     Nose: Nose normal.     Mouth/Throat:     Mouth: Mucous membranes are moist.  Eyes:     General: No scleral icterus.    Conjunctiva/sclera: Conjunctivae normal.     Pupils: Pupils are equal.     Right eye: Pupil is reactive.     Left eye: Pupil is reactive.     Funduscopic exam:    Right eye: No papilledema.        Left eye: No papilledema.     Slit lamp exam:    Right eye: No hyphema, hypopyon or photophobia.     Left eye: No hyphema, hypopyon or photophobia.     Comments: 62m pupils bilaterally, reactive No APD Tender to palpation to right periorbital region  Neck:     Trachea: Phonation normal.  Cardiovascular:     Rate and Rhythm: Normal rate and regular rhythm.  Pulmonary:     Effort: Pulmonary effort is normal. No respiratory  distress.     Breath sounds: No stridor.  Abdominal:     General: There is no distension.  Musculoskeletal:         General: Normal range of motion.     Cervical back: Normal range of motion.  Neurological:     Mental Status: He is alert and oriented to person, place, and time.  Psychiatric:        Behavior: Behavior normal.     ED Results and Treatments Labs (all labs ordered are listed, but only abnormal results are displayed) Labs Reviewed  COMPREHENSIVE METABOLIC PANEL - Abnormal; Notable for the following components:      Result Value   AST 14 (*)    All other components within normal limits  CBC WITH DIFFERENTIAL/PLATELET                                                                                                                         EKG  EKG Interpretation  Date/Time:    Ventricular Rate:    PR Interval:    QRS Duration:   QT Interval:    QTC Calculation:   R Axis:     Text Interpretation:         Radiology No results found.  Pertinent labs & imaging results that were available during my care of the patient were reviewed by me and considered in my medical decision making (see MDM for details).  Medications Ordered in ED Medications  prochlorperazine (COMPAZINE) injection 10 mg (10 mg Intravenous Given 10/03/21 2338)  sodium chloride 0.9 % bolus 1,000 mL (0 mLs Intravenous Stopped 10/04/21 0112)                                                                                                                                     Procedures Procedures  (including critical care time)  Medical Decision Making / ED Course    Complexity of Problem:  Co-morbidities/SDOH that complicate the patient evaluation/care: Noted above in HPI  Patient's presenting problem/concern, DDX, and MDM listed below: Right periorbital, orbital pain with central vision loss Given patient's past medical history of Behcet's, there is a concern for possible optic neuritis.   We will need to order MRI with and without. There is no evidence of uveitis on exam. Additionally also  considering trigeminal neuralgia or ocular migraine though I feel these are less likely given his monocular  symptoms.  Hospitalization Considered:  yes     Complexity of Data:   Laboratory Tests ordered listed below with my independent interpretation: CBC without leukocytosis or anemia No significant electrolyte derangement or renal sufficiency   ED Course:    Assessment, Add'l Intervention, and Reassessment: Right eye pain and vision loss Need to rule out optic neuritis. Patient is to be sent to  [facility] for MRI. Case discussed with Dr. Leonel Ramsay from neurology who agreed.  Once MRI has resulted, please call neurology.  Spoke with Dr. Dina Rich in the [facility] who accepted for ED-ED transfer. Patient will go POV.  Final Clinical Impression(s) / ED Diagnoses Final diagnoses:  Acute right eye pain  Vision loss of right eye           This chart was dictated using voice recognition software.  Despite best efforts to proofread,  errors can occur which can change the documentation meaning.    Fatima Blank, MD 10/04/21 [phone]

## 2021-10-03 NOTE — ED Triage Notes (Addendum)
Pt via POV from home c/o right eye pain and decreased vision onset yesterday. Sts "When I close my eye all I can see is a black hole." Pain behind the eye and radiation to jaw. H/o eye infections and styes. No eye surgery. Denies injury. No weakness. No speech problems. Face symmetrical. No eye irritation/inflammation noted on assessment.

## 2021-10-04 ENCOUNTER — Emergency Department (HOSPITAL_COMMUNITY): Payer: Medicare Other

## 2021-10-04 ENCOUNTER — Encounter (HOSPITAL_COMMUNITY): Payer: Self-pay | Admitting: Family Medicine

## 2021-10-04 DIAGNOSIS — R569 Unspecified convulsions: Secondary | ICD-10-CM | POA: Diagnosis not present

## 2021-10-04 DIAGNOSIS — I10 Essential (primary) hypertension: Secondary | ICD-10-CM | POA: Diagnosis present

## 2021-10-04 DIAGNOSIS — H5711 Ocular pain, right eye: Secondary | ICD-10-CM | POA: Diagnosis not present

## 2021-10-04 DIAGNOSIS — H53411 Scotoma involving central area, right eye: Secondary | ICD-10-CM | POA: Diagnosis not present

## 2021-10-04 DIAGNOSIS — G40909 Epilepsy, unspecified, not intractable, without status epilepticus: Secondary | ICD-10-CM | POA: Diagnosis present

## 2021-10-04 DIAGNOSIS — K5900 Constipation, unspecified: Secondary | ICD-10-CM | POA: Diagnosis present

## 2021-10-04 DIAGNOSIS — Z801 Family history of malignant neoplasm of trachea, bronchus and lung: Secondary | ICD-10-CM | POA: Diagnosis not present

## 2021-10-04 DIAGNOSIS — F419 Anxiety disorder, unspecified: Secondary | ICD-10-CM | POA: Diagnosis present

## 2021-10-04 DIAGNOSIS — F418 Other specified anxiety disorders: Secondary | ICD-10-CM | POA: Diagnosis not present

## 2021-10-04 DIAGNOSIS — F319 Bipolar disorder, unspecified: Secondary | ICD-10-CM | POA: Diagnosis present

## 2021-10-04 DIAGNOSIS — Z8674 Personal history of sudden cardiac arrest: Secondary | ICD-10-CM | POA: Diagnosis not present

## 2021-10-04 DIAGNOSIS — M352 Behcet's disease: Secondary | ICD-10-CM | POA: Diagnosis present

## 2021-10-04 DIAGNOSIS — Z79624 Long term (current) use of inhibitors of nucleotide synthesis: Secondary | ICD-10-CM | POA: Diagnosis not present

## 2021-10-04 DIAGNOSIS — G4733 Obstructive sleep apnea (adult) (pediatric): Secondary | ICD-10-CM | POA: Diagnosis present

## 2021-10-04 DIAGNOSIS — D3132 Benign neoplasm of left choroid: Secondary | ICD-10-CM | POA: Diagnosis present

## 2021-10-04 DIAGNOSIS — H5461 Unqualified visual loss, right eye, normal vision left eye: Secondary | ICD-10-CM | POA: Diagnosis present

## 2021-10-04 DIAGNOSIS — H469 Unspecified optic neuritis: Secondary | ICD-10-CM | POA: Diagnosis present

## 2021-10-04 DIAGNOSIS — Z79899 Other long term (current) drug therapy: Secondary | ICD-10-CM | POA: Diagnosis not present

## 2021-10-04 LAB — COMPREHENSIVE METABOLIC PANEL
ALT: 21 U/L (ref 0–44)
AST: 14 U/L — ABNORMAL LOW (ref 15–41)
Albumin: 4.6 g/dL (ref 3.5–5.0)
Alkaline Phosphatase: 66 U/L (ref 38–126)
Anion gap: 10 (ref 5–15)
BUN: 13 mg/dL (ref 6–20)
CO2: 27 mmol/L (ref 22–32)
Calcium: 9.9 mg/dL (ref 8.9–10.3)
Chloride: 102 mmol/L (ref 98–111)
Creatinine, Ser: 1.04 mg/dL (ref 0.61–1.24)
GFR, Estimated: >60 mL/min (ref >60)
Glucose, Bld: 87 mg/dL (ref 70–99)
Potassium: 3.5 mmol/L (ref 3.5–5.1)
Sodium: 139 mmol/L (ref 135–145)
Total Bilirubin: 0.7 mg/dL (ref 0.3–1.2)
Total Protein: 7.7 g/dL (ref 6.5–8.1)

## 2021-10-04 LAB — SEDIMENTATION RATE: Sed Rate: 8 mm/hr (ref 0–16)

## 2021-10-04 LAB — C-REACTIVE PROTEIN: CRP: <0.5 mg/dL (ref <1.0)

## 2021-10-04 MED ORDER — ENOXAPARIN SODIUM 40 MG/0.4ML IJ SOSY
40.0000 mg | PREFILLED_SYRINGE | Freq: Every day | INTRAMUSCULAR | Status: DC
  Administered 2021-10-05 – 2021-10-08 (×4): 40 mg via SUBCUTANEOUS
  Filled 2021-10-04 (×4): qty 0.4

## 2021-10-04 MED ORDER — LORAZEPAM 2 MG/ML IJ SOLN
0.5000 mg | INTRAMUSCULAR | Status: AC | PRN
  Administered 2021-10-04: 0.5 mg via INTRAVENOUS
  Filled 2021-10-04: qty 1

## 2021-10-04 MED ORDER — LAMOTRIGINE 25 MG PO TABS
200.0000 mg | ORAL_TABLET | Freq: Two times a day (BID) | ORAL | Status: DC
  Administered 2021-10-04: 200 mg via ORAL
  Filled 2021-10-04: qty 8

## 2021-10-04 MED ORDER — SODIUM CHLORIDE 0.9 % IV SOLN
750.0000 mg | Freq: Two times a day (BID) | INTRAVENOUS | Status: DC
  Administered 2021-10-04: 750 mg via INTRAVENOUS
  Filled 2021-10-04 (×3): qty 7.5

## 2021-10-04 MED ORDER — ATORVASTATIN CALCIUM 40 MG PO TABS
80.0000 mg | ORAL_TABLET | Freq: Every day | ORAL | Status: DC
  Administered 2021-10-05 – 2021-10-08 (×4): 80 mg via ORAL
  Filled 2021-10-04 (×4): qty 2

## 2021-10-04 MED ORDER — LORAZEPAM 2 MG/ML IJ SOLN
1.0000 mg | INTRAMUSCULAR | Status: AC | PRN
  Administered 2021-10-04: 1 mg via INTRAVENOUS
  Filled 2021-10-04: qty 1

## 2021-10-04 MED ORDER — MELATONIN 5 MG PO TABS
5.0000 mg | ORAL_TABLET | Freq: Every day | ORAL | Status: DC
  Administered 2021-10-05 – 2021-10-07 (×4): 5 mg via ORAL
  Filled 2021-10-04 (×4): qty 1

## 2021-10-04 MED ORDER — ACETAMINOPHEN 500 MG PO TABS
1000.0000 mg | ORAL_TABLET | Freq: Once | ORAL | Status: AC
  Administered 2021-10-04: 1000 mg via ORAL
  Filled 2021-10-04: qty 2

## 2021-10-04 MED ORDER — GADOBUTROL 1 MMOL/ML IV SOLN
9.0000 mL | Freq: Once | INTRAVENOUS | Status: AC | PRN
  Administered 2021-10-04: 9 mL via INTRAVENOUS

## 2021-10-04 MED ORDER — SENNOSIDES-DOCUSATE SODIUM 8.6-50 MG PO TABS
1.0000 | ORAL_TABLET | Freq: Every evening | ORAL | Status: DC | PRN
  Administered 2021-10-07: 1 via ORAL
  Filled 2021-10-04: qty 1

## 2021-10-04 MED ORDER — PANTOPRAZOLE SODIUM 20 MG PO TBEC
20.0000 mg | DELAYED_RELEASE_TABLET | Freq: Every day | ORAL | Status: DC
  Administered 2021-10-05 – 2021-10-08 (×4): 20 mg via ORAL
  Filled 2021-10-04 (×4): qty 1

## 2021-10-04 MED ORDER — ACETAMINOPHEN 325 MG PO TABS
650.0000 mg | ORAL_TABLET | Freq: Four times a day (QID) | ORAL | Status: DC | PRN
  Administered 2021-10-05: 650 mg via ORAL
  Filled 2021-10-04: qty 2

## 2021-10-04 MED ORDER — DIPHENHYDRAMINE HCL 50 MG/ML IJ SOLN
12.5000 mg | Freq: Once | INTRAMUSCULAR | Status: AC
  Administered 2021-10-04: 12.5 mg via INTRAVENOUS
  Filled 2021-10-04: qty 1

## 2021-10-04 MED ORDER — SODIUM CHLORIDE 0.9 % IV SOLN
1000.0000 mg | INTRAVENOUS | Status: DC
  Administered 2021-10-04 – 2021-10-06 (×3): 1000 mg via INTRAVENOUS
  Filled 2021-10-04 (×4): qty 16

## 2021-10-04 MED ORDER — ACETAMINOPHEN 650 MG RE SUPP: 650.0000 mg | Freq: Four times a day (QID) | RECTAL | Status: DC | PRN

## 2021-10-04 MED ORDER — LEVETIRACETAM 750 MG PO TABS
750.0000 mg | ORAL_TABLET | Freq: Two times a day (BID) | ORAL | Status: DC
  Administered 2021-10-05 – 2021-10-08 (×8): 750 mg via ORAL
  Filled 2021-10-04 (×8): qty 1

## 2021-10-04 MED ORDER — OXYCODONE HCL 5 MG PO TABS
5.0000 mg | ORAL_TABLET | ORAL | Status: AC | PRN
  Administered 2021-10-05 (×2): 5 mg via ORAL
  Filled 2021-10-04 (×2): qty 1

## 2021-10-04 MED ORDER — LAMOTRIGINE 100 MG PO TABS
200.0000 mg | ORAL_TABLET | Freq: Two times a day (BID) | ORAL | Status: DC
  Administered 2021-10-05 – 2021-10-08 (×8): 200 mg via ORAL
  Filled 2021-10-04 (×8): qty 2

## 2021-10-04 MED ORDER — CLONAZEPAM 0.25 MG PO TBDP
0.2500 mg | ORAL_TABLET | Freq: Three times a day (TID) | ORAL | Status: DC
  Administered 2021-10-05 – 2021-10-08 (×11): 0.25 mg via ORAL
  Filled 2021-10-04 (×11): qty 1

## 2021-10-04 MED ORDER — FLUOXETINE HCL 20 MG PO CAPS
40.0000 mg | ORAL_CAPSULE | Freq: Every day | ORAL | Status: DC
  Administered 2021-10-05 – 2021-10-08 (×4): 40 mg via ORAL
  Filled 2021-10-04 (×4): qty 2

## 2021-10-04 MED ORDER — METOCLOPRAMIDE HCL 5 MG/ML IJ SOLN
10.0000 mg | Freq: Once | INTRAMUSCULAR | Status: AC
Start: 2021-10-04 — End: 2021-10-04
  Administered 2021-10-04: 10 mg via INTRAVENOUS
  Filled 2021-10-04: qty 2

## 2021-10-04 MED ORDER — APREMILAST 30 MG PO TABS: 30.0000 mg | ORAL_TABLET | Freq: Two times a day (BID) | ORAL | Status: DC

## 2021-10-04 MED ORDER — TROPICAMIDE 1 % OP SOLN
1.0000 [drp] | Freq: Once | OPHTHALMIC | Status: AC
  Administered 2021-10-04: 1 [drp] via OPHTHALMIC
  Filled 2021-10-04: qty 15

## 2021-10-04 MED ORDER — TETRACAINE HCL 0.5 % OP SOLN
1.0000 [drp] | Freq: Once | OPHTHALMIC | Status: AC
  Administered 2021-10-04: 1 [drp] via OPHTHALMIC
  Filled 2021-10-04: qty 4

## 2021-10-04 MED ORDER — LORAZEPAM 1 MG PO TABS
1.0000 mg | ORAL_TABLET | ORAL | Status: AC | PRN
  Administered 2021-10-04: 1 mg via ORAL
  Filled 2021-10-04: qty 1

## 2021-10-04 MED ORDER — MYCOPHENOLATE MOFETIL 250 MG PO CAPS
1000.0000 mg | ORAL_CAPSULE | Freq: Two times a day (BID) | ORAL | Status: DC
  Administered 2021-10-05 – 2021-10-08 (×8): 1000 mg via ORAL
  Filled 2021-10-04 (×9): qty 4

## 2021-10-04 MED ORDER — KETOROLAC TROMETHAMINE 30 MG/ML IJ SOLN
15.0000 mg | Freq: Once | INTRAMUSCULAR | Status: AC
  Administered 2021-10-04: 15 mg via INTRAVENOUS
  Filled 2021-10-04: qty 1

## 2021-10-04 MED ORDER — METOPROLOL TARTRATE 25 MG PO TABS
12.5000 mg | ORAL_TABLET | Freq: Two times a day (BID) | ORAL | Status: DC
  Administered 2021-10-05 – 2021-10-08 (×8): 12.5 mg via ORAL
  Filled 2021-10-04 (×8): qty 1

## 2021-10-04 MED ORDER — QUETIAPINE FUMARATE 100 MG PO TABS
200.0000 mg | ORAL_TABLET | Freq: Every day | ORAL | Status: DC
  Administered 2021-10-05 – 2021-10-07 (×4): 200 mg via ORAL
  Filled 2021-10-04 (×4): qty 2

## 2021-10-04 MED ORDER — GADOBUTROL 1 MMOL/ML IV SOLN
10.0000 mL | Freq: Once | INTRAVENOUS | Status: AC | PRN
  Administered 2021-10-04: 10 mL via INTRAVENOUS

## 2021-10-04 NOTE — Discharge Instructions (Addendum)
Please follow-up with ophthalmology at the following location: [facility] [address], [name], [place] [address]   [phone]

## 2021-10-04 NOTE — Consult Note (Signed)
[name] [name]                                                                               10/04/2021                                               Ophthalmology Consultation                                         Consult requested by: Dr. Leonette Monarch  Reason for consultation:  Acute central vision loss right eye  HPI: Patient notes 2 day history of central vision loss OD with intact peripheral vision.  He has not seen an ophthalmologist since 2015.  He states he has a history of Behcet's and is on disability from his Behcet's  Pertinent Medical History:   Active Ambulatory Problems    Diagnosis Date Noted   BIPOLAR DISORDER UNSPECIFIED 06/18/2008   Essential hypertension 06/18/2008   SYNCOPE AND COLLAPSE 06/18/2008   Epilepsy ([place]) 06/18/2008   History of oral aphthous ulcers 10/19/2010   Encounter for therapeutic drug monitoring 06/14/2012   Dehydration 02/15/2013   Behcet's disease ([place]) 02/15/2013   Recurrent vomiting 02/15/2013   Cellulitis and abscess of trunk-right chest wall 05/08/2013   Peripheral vertigo 07/17/2015   Vertigo 07/17/2015   Intractable vomiting 07/17/2015   Diplopia    Altered mental status 07/07/2016   Post-ictal coma ([place]) 08/03/2016   Acute respiratory failure with hypoxemia (HCC)    Seizure (HCC)    Lactic acidosis    Acute respiratory failure with hypoxia (HCC)    Seizures ([place])    Cardiac arrest ([place]) 12/25/2017   Intubation of airway performed without difficulty    Chest pain with moderate risk for cardiac etiology 11/29/2018   Resolved Ambulatory Problems    Diagnosis Date Noted   No Resolved Ambulatory Problems   Past Medical History:  Diagnosis Date   Anxiety    Bipolar disorder ([place])    Depression    Hypertension    Migraine    Sleep apnea    Vitamin D deficiency      Pertinent Ophthalmic History: None   (History of Behcet's without ocular involvement  Current Eye Medications: none  Systemic medications on admission:  (Not in a  hospital admission)     Apremilast 30 MG TABS Take 30 mg by mouth daily.        [provider]  atorvastatin (LIPITOR) 80 MG tablet TAKE 1 TABLET(80 MG) BY MOUTH DAILY 10/13/20     Croitoru, Mihai, MD  clonazePAM (KLONOPIN) 0.5 MG tablet Take 0.25 mg by mouth 3 (three) times daily.       [provider]  doxycycline (VIBRAMYCIN) 100 MG capsule Take 1 capsule (100 mg total) by mouth 2 (two) times daily. 08/05/21     Horton, Barbette Hair, MD  FLUoxetine (PROZAC) 20 MG tablet Take 20 mg by mouth 2 (two) times daily. 11/27/16  [provider]  lamoTRIgine 200 MG TBDP Take 1 tablet (200 mg total) by mouth in the morning and at bedtime. 05/30/21     Alric Ran, MD  levETIRAcetam (KEPPRA) 750 MG tablet Take 1 tablet (750 mg total) by mouth 2 (two) times daily. 04/28/21     Alric Ran, MD  lidocaine (XYLOCAINE) 2 % solution Use as directed 15 mLs in the mouth or throat as needed for mouth pain. 03/06/21     Prosperi, Christian H, PA-C  metoprolol tartrate (LOPRESSOR) 25 MG tablet TAKE 1/2 TABLET BY MOUTH TWICE DAILY 02/08/21     Croitoru, Mihai, MD  mycophenolate (CELLCEPT) 500 MG tablet Take 500-1,000 mg by mouth See admin instructions. Take 1000 mg in the morning and 500 mg at night 03/20/17     [provider]  pantoprazole (PROTONIX) 20 MG tablet Take 1 tablet (20 mg total) by mouth daily. 03/06/21     Prosperi, Christian H, PA-C  QUEtiapine (SEROQUEL) 200 MG tablet Take 200 mg by mouth at bedtime.       [provider]  sucralfate (CARAFATE) 1 GM/10ML suspension Take 10 mLs (1 g total) by mouth 4 (four) times daily -  with meals and at bedtime. 03/06/21     Prosperi, Christian H, PA-C  VITAMIN D PO Take by mouth.       [provider]   ROS: Eyes - vision loss central OD; Neuro (headache); HENT, Psych, Const, GI, GU, Cardiac, Resp, M/S, Integ, Immuno, Endocrine (Negative)  Visual Fields: FTC OU      Pupils:  Pharmacologically dilated at my  direction before exam   ? RAPD OD noted  Near acuity:   OD CF OD   OS 20/30  TA:       OD: 14 mmhg    OS: 17 mmhg  by Tonopen     Dilation:        Medication used  Tropicamide   External:   OD:  Normal       OS:  Normal      Anterior segment exam:  By penlight   By slit lap      Conjunctiva:  OD:  Quiet, no hyperemia   OS:  Quiet, no hyperemia    Cornea:    OD: Clear, no KP noted   OS: Clear, no KP noted    Anterior Chamber:   OD:  Deep/quiet   no AC cell noted   OS:  Deep/quiet    no AC cell noted  Iris:    OD:  Normal       OS:  Normal      Lens:    OD:  Clear, no PSC cataract, no post-inflammatory lens changes     OS:  Clear, no PSC cataract, no post-inflammatory lens changes     Optic disc:  OD:  Flat, sharp, pink, healthy      OS:  Flat, sharp, pink, healthy      Central retina--examined with indirect ophthalmoscope:  OD:  Macula and vessels normal; media clear,  no ERM or pigment mottling         OS:  Macula and vessels normal; media clear, temporal choroidal nevus, no ERM or pigment mottling      Peripheral retina--examined with indirect ophthalmoscope with lid speculum and scleral depression:   OD:  Normal to ora 360 degrees      OS:  Normal to ora 360 degrees      Impression:  1) Painless central vision loss OD - not ocular - likely from posterior circulation  - MRI of brain with and without contrast recommended 2) Behcet's disease without ocular findings 3) Choroidal nevus OS   Recommendations/Plan:  Evaluate findings from MRI; Outpatient evaluation for angiography to rule out retinal circulation affecting central vision loss OD.  Follow up at:  [facility] [address], [place], [place] [address]  [phone]      I've discussed these findings with the attending.  Please contact our office with any questions or concerns at [phone]. Thank you for calling us to care for this patient.  Jalene Mullet, MD

## 2021-10-04 NOTE — ED Notes (Signed)
Pt instructed to go straight to [facility] ED with father for MRI. Pt verbalized understanding. IV secured for POV transfer per cardama, MD. Transfer packet given to pt.

## 2021-10-04 NOTE — ED Notes (Addendum)
Pt transported to MRI 

## 2021-10-04 NOTE — Consult Note (Signed)
Neurology Consultation Reason for Consult: Painful vision loss Referring Physician: Eulis Foster, ED  CC: Painful vision loss  History is obtained from: Patient  HPI: [name] [name] is a 41 y.o. male with a history of Behcet's disease, bipolar disorder, hypertension, migraine who presents with painful vision loss that started 2 days ago.  He states that he woke up and looked over at his clock, and realized he could not see the clock when looking straight out at.  He lifted his head so he could open his left eye and everything appeared normal.  He noticed that he has a loss of central vision of his right eye.  In the intervening 2 days, he feels that it has gotten slightly worse, and the pain around his eye has gotten significantly worse.  He describes the pain as being circumferential around his right orbit, and does not seem to be worsened with movement.  He was seen by ophthalmology today and referred back to the emergency department for an MRI of the brain.  Past Medical History:  Diagnosis Date   Anxiety    Behcet's disease ([place])    Bipolar disorder ([place])    Cardiac arrest ([place]) 12/25/2017   one round of CPR    Depression    Hypertension    Migraine    Seizures ([place])    last one 2017   Sleep apnea    CPAP at night   Vitamin D deficiency      Family History  Problem Relation Age of Onset   Lung cancer Paternal Grandfather    Other Father        BPPV     Social History:  reports that he has never smoked. He has never used smokeless tobacco. He reports that he does not drink alcohol and does not use drugs.   Exam: Current vital signs: BP 117/88   Pulse 84   Temp 98.9 F (37.2 C)   Resp 17   Ht _0  (1.854 m)   Wt 97.5 kg   SpO2 97%   BMI 28.37 kg/m  Vital signs in last 24 hours: Temp:  [97.9 F (36.6 C)-98.9 F (37.2 C)] 98.9 F (37.2 C) (07/04 1914) Pulse Rate:  [69-91] 84 (07/04 1914) Resp:  [15-25] 17 (07/04 1914) BP: (102-143)/(67-94) 117/88 (07/04  1914) SpO2:  [95 %-100 %] 97 % (07/04 1914)   Physical Exam  Constitutional: Appears well-developed and well-nourished.   Neuro: Mental Status: Patient is awake, alert, oriented to person, place, month, year, and situation. Patient is able to give a clear and coherent history. No signs of aphasia or neglect Cranial Nerves: II: Visual Fields are full. Pupils are equal, round, and reactive to light.  No afferent pupillary defect III,IV, VI: EOMI without ptosis or diploplia.  V: Facial sensation is symmetric to temperature VII: Facial movement is symmetric.  VIII: hearing is intact to voice X: Uvula elevates symmetrically XI: Shoulder shrug is symmetric. XII: tongue is midline without atrophy or fasciculations.  Motor: Tone is normal. Bulk is normal. 5/5 strength was present in all four extremities.  Sensory: Sensation is symmetric to light touch and temperature in the arms and legs. Cerebellar: FNF and HKS are intact bilaterally   I have reviewed labs in epic and the results pertinent to this consultation are: ESR and CRP are normal  I have reviewed the images obtained: MRI orbits: No findings  Impression: 41 year old male with painful, progressive right central vision loss.  Clinically this most fits  with optic neuritis, though the lack of afferent pupillary defect or findings on MRI would call this into question.    The presence of pain or findings on ophthalmologic exam earlier would argue against retinal ischemia.  Ischemic optic neuropathy also is typically painless.  Given that he does have an immunological condition that is associated with optic neuritis, as well as a clinical syndrome consistent with it, I think that treating with steroids would be the prudent course.    Recommendations: 1) Solu-Medrol 1 g daily x5 days, would consider trying to arrange this through the infusion center tomorrow, but he can receive his first doses as inpatient 2) MRI brain to look for  findings of MS  3) outpatient follow-up with neuro-ophthalmology    Roland Rack, MD [facility] [phone]  If 7pm- 7am, please page neurology on call as listed in [place].

## 2021-10-04 NOTE — ED Provider Notes (Signed)
  Physical Exam  BP (!) 143/67 (BP Location: Right Arm)   Pulse 76   Temp 97.9 F (36.6 C) (Oral)   Resp 15   Ht '6\' 1"'$  (1.854 m)   Wt 97.5 kg   SpO2 98%   BMI 28.37 kg/m     Procedures  Procedures  ED Course / MDM    Medical Decision Making Amount and/or Complexity of Data Reviewed Labs: ordered. Radiology: ordered.  Risk OTC drugs. Prescription drug management.   Patient is a 41 year old male presenting from the med center [facility] emergency department overnight due to right eye pain and vision loss.  The patient has a history of Behcet's disease and presents for MRI imaging to evaluate for optic neuritis.  Symptoms have been ongoing for the past 2 days.  I personally performed visual acuity screening.  The patient has centralized vision loss with no visual acuity in the right eye.  20 out of 20 in the left eye.    MRI imaging of the orbits was performed and was negative for evidence of optic neuritis.  On my evaluation, the patient had tenderness to palpation over the right temporal artery.  He has a headache and acute vision loss in the setting of a known history of inflammatory disease with Behcet's disease.  Ddx includes temporal arteritis vs CRAO vs acute glaucoma.  Intraocular pressures were checked and were found to be 15-18 on the right. The patient has slight APD on the right with slight dilation of the right pupil on exam. He has complete central vision loss. Discussed with Dr. Posey Pronto who felt that symptoms are consistent with likely CRAO. He will be present bedside to evaluate.  73 Dr. Posey Pronto performed a dilated eye exam on the patient which was unremarkable.  Dr. Posey Pronto felt that this could be due to a posterior circulation occlusion and recommended MRI brain with and without contrast.  The patient had no ocular findings of Behcet's flare. Recommended outpatient ophthalmology follow-up for outpatient evaluation for angiography to rule out retinal circulation  affecting central vision loss OD.  1351 Spoke with Dr. Ann Held of neurology who recommended MRI Venogram and MRI Brain WWO contrast.  Neurology will, evaluate the patient.  Reorder the patient's home medications and coordination with pharmacy.  Signout given to Dr. Eulis Foster at 1630 pending MRI imaging and reassessment.       Regan Lemming, MD 10/04/21 [phone]

## 2021-10-04 NOTE — ED Provider Notes (Signed)
4:15 PM-checkout from Dr. Kellie Simmering evaluated patient, as work-up is in process.  Plan for repeat MRI, brain with and without contrast to evaluate for possible intracranial abnormalities causing Right-sided central vision loss.  Vision loss is accompanied by pain.  8:05 PM-patient has menses examination hospitalist, Dr. Leonel Ramsay.  He recommends treating patient for enteritis with Solu-Medrol, 1 g IV every 24 hours x5 days.  He recommends patient admitted to the hospitalist.  He will have the neuro hospitalist service-year-old with consultation and management.  Case discussed with hospitalist who will admit the patient.  I have started Solu-Medrol.   Daleen Bo, MD 10/04/21 2110

## 2021-10-04 NOTE — H&P (Signed)
History and Physical    [name] [name] DPO:[id number] DOB: [birth year] DOA: 10/03/2021  PCP: [facility] [place]   Patient coming from: Home   Chief Complaint: Right eye pain and vision loss   HPI: [name] [name] is a pleasant 41 y.o. male with medical history significant for Behcet's disease, seizures, depression, anxiety, hypertension, and OSA, now presenting to the emergency department with right eye pain and vision loss.  Patient reports that he was in his usual state until the night of 10/02/2021 when he developed pain around the right eye with some radiation to the jaw.  The next morning, he had difficulty seeing what time it was and realized that he could see well with his right eye closed, but with his left eye closed, he had central vision loss.  Symptoms persisted and he came into the ED.  He had never experienced this previously.  No fever or chills.  No recent trauma.  No focal numbness or weakness.  ED Course: Upon arrival to the ED, patient is found to be afebrile and saturating well on room air with stable blood pressure.  CMP and CBC were unremarkable, ESR was 8, and CRP undetectable.  He was initially at [facility] and sent to [name] ED for evaluation by ophthalmology and MRI orbits which was negative.  Ophthalmology did dilated exam, did not find any ocular cause for the patient's problem, and neurology was consulted and evaluated the patient.  Primary concern was for optic neuritis and neurology recommended starting 1 g of IV Solu-Medrol daily and checking MRI brain.  Review of Systems:  All other systems reviewed and apart from HPI, are negative.  Past Medical History:  Diagnosis Date   Anxiety    Behcet's disease ([place])    Bipolar disorder ([place])    Cardiac arrest ([place]) 12/25/2017   one round of CPR    Depression    Hypertension    Migraine    Seizures ([place])    last one 2017   Sleep apnea    CPAP at night   Vitamin D deficiency      Past Surgical History:  Procedure Laterality Date   APPENDECTOMY     CYSTOSCOPY WITH URETHRAL DILATATION N/A 10/15/2019   Procedure: CYSTOSCOPY WITH URETHRAL DILATATION WITH BALLOON;  Surgeon: Lucas Mallow, MD;  Location: [facility];  Service: Urology;  Laterality: N/A;   FRACTURE SURGERY Right    Arm   LEFT HEART CATH AND CORONARY ANGIOGRAPHY N/A 11/29/2018   Procedure: LEFT HEART CATH AND CORONARY ANGIOGRAPHY;  Surgeon: Martinique, Peter M, MD;  Location: [facility];  Service: Cardiovascular;  Laterality: N/A;   maxilofacial     NISSEN FUNDOPLICATION     RADIOLOGY WITH ANESTHESIA N/A 02/13/2019   Procedure: MRI WITH ANESTHESIA OF BRAIN WITH AND WITHOUT CONTRAST;  Surgeon: Radiologist, Medication, MD;  Location: [facility];  Service: Radiology;  Laterality: N/A;    Social History:   reports that he has never smoked. He has never used smokeless tobacco. He reports that he does not drink alcohol and does not use drugs.  No Known Allergies  Family History  Problem Relation Age of Onset   Lung cancer Paternal Grandfather    Other Father        BPPV     Prior to Admission medications   Medication Sig Start Date End Date Taking? Authorizing Provider  Apremilast 30 MG TABS Take 30 mg by mouth in the  morning and at bedtime.   Yes [provider]  atorvastatin (LIPITOR) 80 MG tablet TAKE 1 TABLET(80 MG) BY MOUTH DAILY Patient taking differently: Take 80 mg by mouth daily. 10/13/20  Yes Croitoru, Mihai, MD  clonazePAM (KLONOPIN) 0.5 MG tablet Take 0.25 mg by mouth 3 (three) times daily.   Yes [provider]  FLUoxetine (PROZAC) 40 MG capsule Take 40 mg by mouth daily.   Yes [provider]  lamoTRIgine 200 MG TBDP Take 1 tablet (200 mg total) by mouth in the morning and at bedtime. 05/30/21  Yes Alric Ran, MD  levETIRAcetam (KEPPRA) 750 MG tablet Take 1 tablet (750 mg total) by mouth 2 (two) times daily. 04/28/21  Yes Camara, Maryan Puls, MD  lidocaine  (XYLOCAINE) 2 % solution Use as directed 15 mLs in the mouth or throat as needed for mouth pain. 03/06/21  Yes Prosperi, Christian H, PA-C  melatonin 5 MG TABS Take 5 mg by mouth at bedtime.   Yes [provider]  metoprolol tartrate (LOPRESSOR) 25 MG tablet TAKE 1/2 TABLET BY MOUTH TWICE DAILY 02/08/21  Yes Croitoru, Mihai, MD  mycophenolate (CELLCEPT) 500 MG tablet Take 1,000 mg by mouth 2 (two) times daily. 03/20/17  Yes [provider]  QUEtiapine (SEROQUEL) 200 MG tablet Take 200 mg by mouth at bedtime.   Yes [provider]  pantoprazole (PROTONIX) 20 MG tablet Take 1 tablet (20 mg total) by mouth daily. Patient not taking: Reported on 10/04/2021 03/06/21   Prosperi, Christian H, PA-C  sucralfate (CARAFATE) 1 GM/10ML suspension Take 10 mLs (1 g total) by mouth 4 (four) times daily -  with meals and at bedtime. Patient not taking: Reported on 10/04/2021 03/06/21   Dorien Chihuahua    Physical Exam: Vitals:   10/04/21 1330 10/04/21 1914 10/04/21 2126 10/04/21 2200  BP: 111/68 117/88 114/85 (!) 122/92  Pulse: 84 84 82 88  Resp: (!) '23 17 17   ' Temp:  98.9 F (37.2 C) 98.3 F (36.8 C)   TempSrc:      SpO2: 96% 97% 98% 97%  Weight:      Height:        Constitutional: NAD, calm  Eyes: PERTLA, lids and conjunctivae normal ENMT: Mucous membranes are moist. Posterior pharynx clear of any exudate or lesions.   Neck: supple, no masses  Respiratory: no wheezing, no crackles. No accessory muscle use.  Cardiovascular: S1 & S2 heard, regular rate and rhythm. No extremity edema. No significant JVD. Abdomen: No distension, no tenderness, soft. Bowel sounds active.  Musculoskeletal: no clubbing / cyanosis. No joint deformity upper and lower extremities.   Skin: no significant rashes, lesions, ulcers. Warm, dry, well-perfused. Neurologic: Central vision loss on right, CN 2-12 grossly intact otherwise. Sensation to light touch intact. Strength 5/5 in all 4 limbs.  Alert and oriented.  Psychiatric: Pleasant. Cooperative.    Labs and Imaging on Admission: I have personally reviewed following labs and imaging studies  CBC: Recent Labs  Lab 10/03/21 2324  WBC 8.1  NEUTROABS 5.3  HGB 14.4  HCT 43.4  MCV 92.5  PLT 448   Basic Metabolic Panel: Recent Labs  Lab 10/03/21 2324  NA 139  K 3.5  CL 102  CO2 27  GLUCOSE 87  BUN 13  CREATININE 1.04  CALCIUM 9.9   GFR: Estimated Creatinine Clearance: 112.6 mL/min (by C-G formula based on SCr of 1.04 mg/dL). Liver Function Tests: Recent Labs  Lab 10/03/21 2324  AST 14*  ALT 21  ALKPHOS 66  BILITOT 0.7  PROT 7.7  ALBUMIN 4.6   No results for input(s): "LIPASE", "AMYLASE" in the last 168 hours. No results for input(s): "AMMONIA" in the last 168 hours. Coagulation Profile: No results for input(s): "INR", "PROTIME" in the last 168 hours. Cardiac Enzymes: No results for input(s): "CKTOTAL", "CKMB", "CKMBINDEX", "TROPONINI" in the last 168 hours. BNP (last 3 results) No results for input(s): "PROBNP" in the last 8760 hours. HbA1C: No results for input(s): "HGBA1C" in the last 72 hours. CBG: No results for input(s): "GLUCAP" in the last 168 hours. Lipid Profile: No results for input(s): "CHOL", "HDL", "LDLCALC", "TRIG", "CHOLHDL", "LDLDIRECT" in the last 72 hours. Thyroid Function Tests: No results for input(s): "TSH", "T4TOTAL", "FREET4", "T3FREE", "THYROIDAB" in the last 72 hours. Anemia Panel: No results for input(s): "VITAMINB12", "FOLATE", "FERRITIN", "TIBC", "IRON", "RETICCTPCT" in the last 72 hours. Urine analysis:    Component Value Date/Time   COLORURINE YELLOW 03/06/2021 1925   APPEARANCEUR CLEAR 03/06/2021 1925   LABSPEC 1.021 03/06/2021 1925   PHURINE 6.0 03/06/2021 1925   GLUCOSEU NEGATIVE 03/06/2021 1925   HGBUR NEGATIVE 03/06/2021 1925   BILIRUBINUR NEGATIVE 03/06/2021 1925   KETONESUR NEGATIVE 03/06/2021 1925   PROTEINUR NEGATIVE 03/06/2021 1925   UROBILINOGEN  1.0 08/15/2013 2011   NITRITE NEGATIVE 03/06/2021 1925   LEUKOCYTESUR NEGATIVE 03/06/2021 1925   Sepsis Labs: '@LABRCNTIP' (procalcitonin:4,lacticidven:4) )No results found for this or any previous visit (from the past 240 hour(s)).   Radiological Exams on Admission:  Assessment/Plan   1. Right central vision loss  - Presents with right eye pain and central vision loss, had negative MRI orbits and no ocular etiology on ophthalmology exam  - Neurology concerned for optic neuritis and recommending 1 g IV Solu-Medrol daily x5 and MRI brain    2. Hypertension  - Continue metoprolol    3. Seizures  - No recent seizure, continue Keppra and Lamictal    4. Depression, anxiety  - Continue Prozac, Seroquel, Klonopin    5. Behcet's disease  - Continue Apremilast and Cellcept    DVT prophylaxis: Lovenox  Code Status: Full  Level of Care: Level of care: Med-Surg Family Communication: none present  Disposition Plan:  Patient is from: home  Anticipated d/c is to: Home  Anticipated d/c date is: 10/06/21  Patient currently: Pending MRI brain, arrangement for IV Solu-Medrol to be given at infusion center  Consults called: neurology  Admission status: Inpatient     Vianne Bulls, MD [facility]  10/04/2021, 11:32 PM

## 2021-10-04 NOTE — ED Notes (Signed)
Pt didn't get his medications for seizures 2x. Request for pharmacy tech to go over his medication list with him and then the MD will be able to order missing doses.

## 2021-10-04 NOTE — ED Notes (Signed)
Patient transported to MRI 

## 2021-10-05 DIAGNOSIS — F418 Other specified anxiety disorders: Secondary | ICD-10-CM | POA: Diagnosis not present

## 2021-10-05 DIAGNOSIS — H5711 Ocular pain, right eye: Secondary | ICD-10-CM | POA: Diagnosis not present

## 2021-10-05 DIAGNOSIS — M352 Behcet's disease: Secondary | ICD-10-CM | POA: Diagnosis not present

## 2021-10-05 DIAGNOSIS — H53411 Scotoma involving central area, right eye: Secondary | ICD-10-CM | POA: Diagnosis not present

## 2021-10-05 LAB — CBC
HCT: 44 % (ref 39.0–52.0)
Hemoglobin: 15.1 g/dL (ref 13.0–17.0)
MCH: 31.4 pg (ref 26.0–34.0)
MCHC: 34.3 g/dL (ref 30.0–36.0)
MCV: 91.5 fL (ref 80.0–100.0)
Platelets: 187 10*3/uL (ref 150–400)
RBC: 4.81 MIL/uL (ref 4.22–5.81)
RDW: 11.8 % (ref 11.5–15.5)
WBC: 9.9 10*3/uL (ref 4.0–10.5)
nRBC: 0 % (ref 0.0–0.2)

## 2021-10-05 LAB — BASIC METABOLIC PANEL
Anion gap: 11 (ref 5–15)
BUN: 13 mg/dL (ref 6–20)
CO2: 21 mmol/L — ABNORMAL LOW (ref 22–32)
Calcium: 9.1 mg/dL (ref 8.9–10.3)
Chloride: 103 mmol/L (ref 98–111)
Creatinine, Ser: 1.15 mg/dL (ref 0.61–1.24)
GFR, Estimated: >60 mL/min (ref >60)
Glucose, Bld: 162 mg/dL — ABNORMAL HIGH (ref 70–99)
Potassium: 3.7 mmol/L (ref 3.5–5.1)
Sodium: 135 mmol/L (ref 135–145)

## 2021-10-05 LAB — HIV ANTIBODY (ROUTINE TESTING W REFLEX): HIV Screen 4th Generation wRfx: NONREACTIVE

## 2021-10-05 MED ORDER — OXYCODONE HCL 5 MG PO TABS
5.0000 mg | ORAL_TABLET | ORAL | Status: DC | PRN
  Administered 2021-10-05 – 2021-10-08 (×16): 5 mg via ORAL
  Filled 2021-10-05 (×16): qty 1

## 2021-10-05 NOTE — Progress Notes (Signed)
Pt is from home. CM consulted for outpatient IV solumedrol for Friday and Saturday. CM called [facility] infusion clinic and they are closed on Saturday. They also stated they were not sure they would even be able to fit him in on Friday.  CM called Pam with [facility] and she says they would not be able to provide these services for only 2 days.  MD updated.

## 2021-10-05 NOTE — Progress Notes (Signed)
Patient arrived in the unit, no relatives at bedside. Oriented to room set up. Call light within reach, bed at lowest position.

## 2021-10-05 NOTE — Plan of Care (Signed)

## 2021-10-05 NOTE — Progress Notes (Signed)
Progress Note  Patient: [name] [name]:[id number] DOB: [birth year]  DOA: 10/03/2021  DOS: 10/05/2021    Brief hospital course: HPI: [name] [name] is a pleasant 41 y.o. male with medical history significant for Behcet's disease, seizures, depression, anxiety, hypertension, and OSA, now presenting to the emergency department with right eye pain and vision loss.  Patient reports that he was in his usual state until the night of 10/02/2021 when he developed pain around the right eye with some radiation to the jaw.  The next morning, he had difficulty seeing what time it was and realized that he could see well with his right eye closed, but with his left eye closed, he had central vision loss.  Symptoms persisted and he came into the ED.  He had never experienced this previously.  No fever or chills.  No recent trauma.  No focal numbness or weakness.   ED Course: Upon arrival to the ED, patient is found to be afebrile and saturating well on room air with stable blood pressure.  CMP and CBC were unremarkable, ESR was 8, and CRP undetectable.  He was initially at [facility] and sent to [name] ED for evaluation by ophthalmology and MRI orbits which was negative.  Ophthalmology did dilated exam, did not find any ocular cause for the patient's problem, and neurology was consulted and evaluated the patient.  Primary concern was for optic neuritis and neurology recommended starting 1 g of IV Solu-Medrol daily and checking MRI brain.  Hospital Course: There has been some improvement in pain and vision loss with IV steroid thus far. Neurology is recommending continuing IV steroid empirically for 5 days.   Assessment and Plan: Acute, painful loss of central vision of right eye: Given his chronic immunological condition which is a risk factor for optic neuritis, we are treating for this. MRI and ophthalmology exam reassuring.  - Per neurology, continue IV solumedrol 1g daily for 5 days (7/4-7/8). Unable  to secure this as outpatient either by home health or in infusion clinic. Symptoms are thus far only modestly improved.    Hypertension  - Continue metoprolol     Seizures  - No recent seizure, continue Keppra and Lamictal     Depression, anxiety  - Continue Prozac, Seroquel, Klonopin     Behcet's disease  - Continue Apremilast and Cellcept   Subjective: ~20% improvement in vision loss in the right eye, pain improved with oxycodone, not with tylenol.  Objective: Vitals:   10/05/21 0408 10/05/21 0834 10/05/21 1143 10/05/21 1530  BP: 100/64 119/80 106/65 118/70  Pulse: 78 (!) 106 (!) 102 (!) 104  Resp: '18 16 20 16  ' Temp: 98.3 F (36.8 C) 98.2 F (36.8 C) 98.3 F (36.8 C) 98 F (36.7 C)  TempSrc: Oral Oral Oral   SpO2: 96% 95% 93% 96%  Weight:      Height:       Gen: 41 y.o. male in no distress Pulm: Nonlabored breathing room air. Clear CV: Regular rate and rhythm. No murmur, rub, or gallop. No JVD, no dependent edema. GI: Abdomen soft, non-tender, non-distended, with normoactive bowel sounds.  Ext: Warm, no deformities Skin: No rashes, lesions or ulcers on visualized skin. Neuro: Alert and oriented. No focal neurological deficits. Decreased visual acuity centrally on right eye. No scleral injection or corneal opacity.  Psych: Judgement and insight appear fair. Mood euthymic & affect congruent. Behavior is appropriate.    Data Personally reviewed: CBC: Recent Labs  Lab 10/03/21  2324 10/05/21 0151  WBC 8.1 9.9  NEUTROABS 5.3  --   HGB 14.4 15.1  HCT 43.4 44.0  MCV 92.5 91.5  PLT 204 144   Basic Metabolic Panel: Recent Labs  Lab 10/03/21 2324 10/05/21 0151  NA 139 135  K 3.5 3.7  CL 102 103  CO2 27 21*  GLUCOSE 87 162*  BUN 13 13  CREATININE 1.04 1.15  CALCIUM 9.9 9.1   GFR: Estimated Creatinine Clearance: 101.8 mL/min (by C-G formula based on SCr of 1.15 mg/dL). Liver Function Tests: Recent Labs  Lab 10/03/21 2324  AST 14*  ALT 21  ALKPHOS 66   BILITOT 0.7  PROT 7.7  ALBUMIN 4.6   No results for input(s): "LIPASE", "AMYLASE" in the last 168 hours. No results for input(s): "AMMONIA" in the last 168 hours. Coagulation Profile: No results for input(s): "INR", "PROTIME" in the last 168 hours. Cardiac Enzymes: No results for input(s): "CKTOTAL", "CKMB", "CKMBINDEX", "TROPONINI" in the last 168 hours. BNP (last 3 results) No results for input(s): "PROBNP" in the last 8760 hours. HbA1C: No results for input(s): "HGBA1C" in the last 72 hours. CBG: No results for input(s): "GLUCAP" in the last 168 hours. Lipid Profile: No results for input(s): "CHOL", "HDL", "LDLCALC", "TRIG", "CHOLHDL", "LDLDIRECT" in the last 72 hours. Thyroid Function Tests: No results for input(s): "TSH", "T4TOTAL", "FREET4", "T3FREE", "THYROIDAB" in the last 72 hours. Anemia Panel: No results for input(s): "VITAMINB12", "FOLATE", "FERRITIN", "TIBC", "IRON", "RETICCTPCT" in the last 72 hours. Urine analysis:    Component Value Date/Time   COLORURINE YELLOW 03/06/2021 1925   APPEARANCEUR CLEAR 03/06/2021 1925   LABSPEC 1.021 03/06/2021 1925   PHURINE 6.0 03/06/2021 1925   GLUCOSEU NEGATIVE 03/06/2021 1925   HGBUR NEGATIVE 03/06/2021 1925   BILIRUBINUR NEGATIVE 03/06/2021 1925   KETONESUR NEGATIVE 03/06/2021 1925   PROTEINUR NEGATIVE 03/06/2021 1925   UROBILINOGEN 1.0 08/15/2013 2011   NITRITE NEGATIVE 03/06/2021 1925   LEUKOCYTESUR NEGATIVE 03/06/2021 1925   No results found for this or any previous visit (from the past 240 hour(s)).   MR Brain W and Wo Contrast  Result Date: 10/04/2021 CLINICAL DATA:  Acute neurologic deficit EXAM: MRI HEAD WITHOUT AND WITH CONTRAST MR VENOGRAM HEAD WITHOUT AND WITH CONTRAST TECHNIQUE: Multiplanar, multi-echo pulse sequences of the brain and surrounding structures were acquired without and with intravenous contrast. Angiographic images of the intracranial venous structures were acquired using MRV technique without and  with intravenous contrast. CONTRAST:  61m GADAVIST GADOBUTROL 1 MMOL/ML IV SOLN COMPARISON:  None Available. FINDINGS: MRI HEAD WITHOUT AND WITH CONTRAST Brain: No acute infarct, mass effect or extra-axial collection. No acute or chronic hemorrhage. Normal white matter signal, parenchymal volume and CSF spaces. The midline structures are normal. There is no abnormal contrast enhancement. Vascular: Major flow voids are preserved. Skull and upper cervical spine: Normal calvarium and skull base. Visualized upper cervical spine and soft tissues are normal. Sinuses/Orbits:No paranasal sinus fluid levels or advanced mucosal thickening. No mastoid or middle ear effusion. Normal orbits. MR VENOGRAM HEAD WITHOUT AND WITH CONTRAST There is no evidence of dural venous sinus or deep cerebral vein thrombosis. No dural venous sinus stenosis. Congenitally diminutive right transverse sinus. IMPRESSION: Normal MRI and MRV of the brain. Electronically Signed   By: KUlyses JarredM.D.   On: 10/04/2021 22:30   MR Venogram Head  Result Date: 10/04/2021 CLINICAL DATA:  Acute neurologic deficit EXAM: MRI HEAD WITHOUT AND WITH CONTRAST MR VENOGRAM HEAD WITHOUT AND WITH CONTRAST TECHNIQUE: Multiplanar,  multi-echo pulse sequences of the brain and surrounding structures were acquired without and with intravenous contrast. Angiographic images of the intracranial venous structures were acquired using MRV technique without and with intravenous contrast. CONTRAST:  57m GADAVIST GADOBUTROL 1 MMOL/ML IV SOLN COMPARISON:  None Available. FINDINGS: MRI HEAD WITHOUT AND WITH CONTRAST Brain: No acute infarct, mass effect or extra-axial collection. No acute or chronic hemorrhage. Normal white matter signal, parenchymal volume and CSF spaces. The midline structures are normal. There is no abnormal contrast enhancement. Vascular: Major flow voids are preserved. Skull and upper cervical spine: Normal calvarium and skull base. Visualized upper cervical  spine and soft tissues are normal. Sinuses/Orbits:No paranasal sinus fluid levels or advanced mucosal thickening. No mastoid or middle ear effusion. Normal orbits. MR VENOGRAM HEAD WITHOUT AND WITH CONTRAST There is no evidence of dural venous sinus or deep cerebral vein thrombosis. No dural venous sinus stenosis. Congenitally diminutive right transverse sinus. IMPRESSION: Normal MRI and MRV of the brain. Electronically Signed   By: KUlyses JarredM.D.   On: 10/04/2021 22:30   MR ORBITS W WO CONTRAST  Result Date: 10/04/2021 CLINICAL DATA:  41year old male with right eye pain and vision loss. Behcet's disease EXAM: MRI OF THE ORBITS WITHOUT AND WITH CONTRAST TECHNIQUE: Multiplanar, multi-echo pulse sequences of the orbits and surrounding structures were acquired including fat saturation techniques, before and after intravenous contrast administration. CONTRAST:  937mGADAVIST GADOBUTROL 1 MMOL/ML IV SOLN COMPARISON:  Brain MRI 02/13/2019. FINDINGS: Orbits: Suprasellar cistern remains normal, chronic partially empty sella. Normal optic chiasm. Cavernous sinus appears symmetric and within normal limits. Both optic nerves appear within normal limits. No abnormal cranial nerve enhancement. No intraorbital mass or inflammation identified. Extraocular muscles appear within normal limits. Lacrimal glands and globes appear symmetric and normal. Visualized sinuses: Chronic left maxillary sinus mucosal thickening is stable and mild. No sinus fluid levels or worsening aeration. Soft tissues: Negative visible face and scalp soft tissues. Limited intracranial: Major intracranial vascular flow voids are preserved. Visible internal auditory structures appear normal. No intracranial mass effect or ventriculomegaly. Visible gray and white matter signal appears stable and within normal limits. No abnormal enhancement identified. No dural thickening identified. IMPRESSION: Negative MRI appearance of the Orbits, stable compared to  2020 Brain MRI. No explanation for pain or vision loss. Electronically Signed   By: H Genevie Ann.D.   On: 10/04/2021 10:15    Family Communication: None at bedside  Disposition: Status is: Inpatient Remains inpatient appropriate because: Requires IV steroids x5 days Planned Discharge Destination: Home      RyPatrecia PourMD 10/05/2021 4:54 PM Page by amShea Evansom

## 2021-10-06 DIAGNOSIS — M352 Behcet's disease: Secondary | ICD-10-CM | POA: Diagnosis not present

## 2021-10-06 DIAGNOSIS — H53411 Scotoma involving central area, right eye: Secondary | ICD-10-CM | POA: Diagnosis not present

## 2021-10-06 DIAGNOSIS — H5711 Ocular pain, right eye: Secondary | ICD-10-CM | POA: Diagnosis not present

## 2021-10-06 DIAGNOSIS — F418 Other specified anxiety disorders: Secondary | ICD-10-CM | POA: Diagnosis not present

## 2021-10-06 NOTE — Progress Notes (Signed)
Progress Note  Patient: [name] [name] WYO:[id number] DOB: April 28, 1980  DOA: 10/03/2021  DOS: 10/06/2021    Brief hospital course: HPI: [name] [name] is a pleasant 41 y.o. male with medical history significant for Behcet's disease, seizures, depression, anxiety, hypertension, and OSA, now presenting to the emergency department with right eye pain and vision loss.  Patient reports that he was in his usual state until the night of 10/02/2021 when he developed pain around the right eye with some radiation to the jaw.  The next morning, he had difficulty seeing what time it was and realized that he could see well with his right eye closed, but with his left eye closed, he had central vision loss.  Symptoms persisted and he came into the ED.  He had never experienced this previously.  No fever or chills.  No recent trauma.  No focal numbness or weakness.   ED Course: Upon arrival to the ED, patient is found to be afebrile and saturating well on room air with stable blood pressure.  CMP and CBC were unremarkable, ESR was 8, and CRP undetectable.  He was initially at [facility] and sent to [name] ED for evaluation by ophthalmology and MRI orbits which was negative.  Ophthalmology did dilated exam, did not find any ocular cause for the patient's problem, and neurology was consulted and evaluated the patient.  Primary concern was for optic neuritis and neurology recommended starting 1 g of IV Solu-Medrol daily and checking MRI brain.  Hospital Course: There has been some improvement in pain and vision loss with IV steroid thus far. Neurology is recommending continuing IV steroid empirically for 5 days.   Assessment and Plan: Acute, painful loss of central vision of right eye: Given his chronic immunological condition which is a risk factor for optic neuritis, we are treating for this. MRI and ophthalmology exam reassuring.  - Per neurology, continue IV solumedrol 1g daily for 5 days (7/4-7/8). Unable  to secure this as outpatient either by home health or in infusion clinic. D/w neurology who will physically reassess 7/7.   Hypertension  - Continue metoprolol     Seizures  - No recent seizure, continue Keppra and Lamictal     Depression, anxiety  - Continue Prozac, Seroquel, Klonopin     Behcet's disease  - Continue Apremilast and Cellcept   Subjective: Again notes some improvement in central vision loss in right eye, area is smaller than previous. Still with deficit and stable pain.  Objective: Vitals:   10/05/21 1530 10/05/21 2354 10/06/21 0400 10/06/21 1155  BP: 118/70 98/62 104/63 105/74  Pulse: (!) 104 92 84 99  Resp: '16 16 16 20  ' Temp: 98 F (36.7 C) 98 F (36.7 C) 98 F (36.7 C) 98.2 F (36.8 C)  TempSrc:  Oral Oral Oral  SpO2: 96% 95% 97% 98%  Weight:      Height:       Gen: 41 y.o. male in no distress Neuro: Alert and oriented. PERRL, no APD. Psych: Judgement and insight appear fair. Mood anxious & affect congruent. Behavior is appropriate.    MR Brain W and Wo Contrast  Result Date: 10/04/2021 CLINICAL DATA:  Acute neurologic deficit EXAM: MRI HEAD WITHOUT AND WITH CONTRAST MR VENOGRAM HEAD WITHOUT AND WITH CONTRAST TECHNIQUE: Multiplanar, multi-echo pulse sequences of the brain and surrounding structures were acquired without and with intravenous contrast. Angiographic images of the intracranial venous structures were acquired using MRV technique without and with intravenous contrast.  CONTRAST:  75m GADAVIST GADOBUTROL 1 MMOL/ML IV SOLN COMPARISON:  None Available. FINDINGS: MRI HEAD WITHOUT AND WITH CONTRAST Brain: No acute infarct, mass effect or extra-axial collection. No acute or chronic hemorrhage. Normal white matter signal, parenchymal volume and CSF spaces. The midline structures are normal. There is no abnormal contrast enhancement. Vascular: Major flow voids are preserved. Skull and upper cervical spine: Normal calvarium and skull base. Visualized upper  cervical spine and soft tissues are normal. Sinuses/Orbits:No paranasal sinus fluid levels or advanced mucosal thickening. No mastoid or middle ear effusion. Normal orbits. MR VENOGRAM HEAD WITHOUT AND WITH CONTRAST There is no evidence of dural venous sinus or deep cerebral vein thrombosis. No dural venous sinus stenosis. Congenitally diminutive right transverse sinus. IMPRESSION: Normal MRI and MRV of the brain. Electronically Signed   By: KUlyses JarredM.D.   On: 10/04/2021 22:30   MR Venogram Head  Result Date: 10/04/2021 CLINICAL DATA:  Acute neurologic deficit EXAM: MRI HEAD WITHOUT AND WITH CONTRAST MR VENOGRAM HEAD WITHOUT AND WITH CONTRAST TECHNIQUE: Multiplanar, multi-echo pulse sequences of the brain and surrounding structures were acquired without and with intravenous contrast. Angiographic images of the intracranial venous structures were acquired using MRV technique without and with intravenous contrast. CONTRAST:  1102mGADAVIST GADOBUTROL 1 MMOL/ML IV SOLN COMPARISON:  None Available. FINDINGS: MRI HEAD WITHOUT AND WITH CONTRAST Brain: No acute infarct, mass effect or extra-axial collection. No acute or chronic hemorrhage. Normal white matter signal, parenchymal volume and CSF spaces. The midline structures are normal. There is no abnormal contrast enhancement. Vascular: Major flow voids are preserved. Skull and upper cervical spine: Normal calvarium and skull base. Visualized upper cervical spine and soft tissues are normal. Sinuses/Orbits:No paranasal sinus fluid levels or advanced mucosal thickening. No mastoid or middle ear effusion. Normal orbits. MR VENOGRAM HEAD WITHOUT AND WITH CONTRAST There is no evidence of dural venous sinus or deep cerebral vein thrombosis. No dural venous sinus stenosis. Congenitally diminutive right transverse sinus. IMPRESSION: Normal MRI and MRV of the brain. Electronically Signed   By: KeUlyses Jarred.D.   On: 10/04/2021 22:30    Family Communication: None at  bedside  Disposition: Status is: Inpatient Remains inpatient appropriate because: Requires IV steroids x5 days Planned Discharge Destination: Home  RyPatrecia PourMD 10/06/2021 1:26 PM Page by amShea Evansom

## 2021-10-07 DIAGNOSIS — H53411 Scotoma involving central area, right eye: Secondary | ICD-10-CM | POA: Diagnosis not present

## 2021-10-07 DIAGNOSIS — I10 Essential (primary) hypertension: Secondary | ICD-10-CM | POA: Diagnosis not present

## 2021-10-07 DIAGNOSIS — R569 Unspecified convulsions: Secondary | ICD-10-CM | POA: Diagnosis not present

## 2021-10-07 DIAGNOSIS — F418 Other specified anxiety disorders: Secondary | ICD-10-CM | POA: Diagnosis not present

## 2021-10-07 DIAGNOSIS — M352 Behcet's disease: Secondary | ICD-10-CM | POA: Diagnosis not present

## 2021-10-07 MED ORDER — SODIUM CHLORIDE 0.9 % IV SOLN
1000.0000 mg | Freq: Once | INTRAVENOUS | Status: AC
  Administered 2021-10-07: 1000 mg via INTRAVENOUS
  Filled 2021-10-07: qty 16

## 2021-10-07 MED ORDER — SODIUM CHLORIDE 0.9 % IV SOLN
1000.0000 mg | Freq: Once | INTRAVENOUS | Status: AC
  Administered 2021-10-08: 1000 mg via INTRAVENOUS
  Filled 2021-10-07: qty 16

## 2021-10-07 NOTE — Care Management Important Message (Signed)
Important Message  Patient Details  Name: [name] [name] MRN: [id number] Date of Birth: December 10, 1980   Medicare Important Message Given:  Yes     [place] Shannahan 10/07/2021, 2:20 PM

## 2021-10-07 NOTE — Progress Notes (Addendum)
NEUROLOGY CONSULTATION PROGRESS NOTE   Date of service: October 07, 2021 Patient Name: JOHNWILLIAM [name] MRN:  [id number] DOB:  1980/12/18  Brief HPI  [name] [name] is a 41 y.o. male with PMH significant for  has a past medical history of Anxiety, Behcet's disease ([place]), Bipolar disorder ([place]), Cardiac arrest ([place]) (12/25/2017), Depression, Hypertension, Migraine, Seizures ([place]), Sleep apnea, and Vitamin D deficiency. who presented on 10/03/21 with painful central vision loss on the right with onset on 10/01/21. Brain MRI and MRV and orbit imaging was negative for acute concerns or MS findings. ESR and CRP were WNL. Optic neuritis suspected. Ophthalmology consulted.    Interval Hx   No acute events Continues on 5 day course of intravenous solumedrol currently scheduled to end 7/8. Today he is very pleased with the improvement in the vision of his right eye which he describes as initially being a black circle the size of a half dollar coin but has been improving daily and  today is improved to dime sized.He is tolerating the treatment solumedrol infusions without difficulty. We discussed his plan of care. He already reports having an ophthalmology and neurology follow up scheduled next week. Denies any symptoms or concerns today.  He had no questions.   Vitals   Vitals:   10/06/21 1513 10/06/21 1900 10/07/21 0000 10/07/21 0400  BP: 113/70 126/75 (!) 150/88 120/81  Pulse: 95 95 82 82  Resp: _0 Temp: 98.1 F (36.7 C) 98 F (36.7 C) 98.2 F (36.8 C) 98.1 F (36.7 C)  TempSrc: Oral Oral Oral Oral  SpO2: 97% 98% 97% 97%  Weight:      Height:         Body mass index is 28.37 kg/m.  Physical Exam   General: Sitting up independently on side of bed in NAD.  HENT: Normal oropharynx and mucosa. Normal external appearance of ears and nose.  Pulmonary: Symmetric Chest rise. Normal respiratory effort.  Skin: Warm and dry to touch.   Musculoskeletal: Normal digits and nails by inspection. No  clubbing.   Neurologic Examination   Constitutional: Appears well-developed middle aged male and well-nourished.    Neuro: Mental Status: Patient is awake, alert, oriented to person, place, month, year, and situation. Patient is able to give a clear and coherent history. No signs of aphasia or neglect Cranial Nerves: II: Visual Fields are full. Pupils are equal, round, and reactive to light.  No afferent pupillary defect III,IV, VI: EOMI without ptosis or diploplia.  V: Facial sensation is symmetric to temperature VII: Facial movement is symmetric.  VIII: hearing is intact to voice X: Uvula elevates symmetrically XI: Shoulder shrug is symmetric. XII: tongue is midline without atrophy or fasciculations.  Motor: Tone is normal. Bulk is normal. 5/5 strength was present in all four extremities.  Sensory: Sensation is symmetric to light touch and temperature in the arms and legs. Cerebellar: FNF and HKS are intact bilaterally Sensation:  Light touch Intact bilat UE and LE   Labs   Lab Results  Component Value Date   ESRSEDRATE 8 10/04/2021       Component Value Date/Time   CRP <0.5 10/04/2021 1059   CRP 1.9 (H) 12/25/2017 2353   CRP <0.5 (L) 02/15/2013 1497    Basic Metabolic Panel:  Lab Results  Component Value Date   NA 135 10/05/2021   K 3.7 10/05/2021   CO2 21 (L) 10/05/2021   GLUCOSE 162 (H) 10/05/2021   BUN 13 10/05/2021  CREATININE 1.15 10/05/2021   CALCIUM 9.1 10/05/2021   GFRNONAA >60 10/05/2021   GFRAA 105 05/20/2020   HbA1c:  Lab Results  Component Value Date   HGBA1C 5.4 07/08/2016   LDL: No results found for: "LDLCALC" Urine Drug Screen:     Component Value Date/Time   LABOPIA NONE DETECTED 12/20/2016 2307   COCAINSCRNUR NONE DETECTED 12/20/2016 2307   COCAINSCRNUR NEGATIVE 04/28/2008 0415   LABBENZ NONE DETECTED 12/20/2016 2307   LABBENZ NEGATIVE 04/28/2008 0415   AMPHETMU NONE DETECTED 12/20/2016 2307   THCU NONE DETECTED 12/20/2016 2307    LABBARB NONE DETECTED 12/20/2016 2307    Alcohol Level     Component Value Date/Time   ETH <5 08/03/2016 1521   Lab Results  Component Value Date   LAMOTRIGINE 5.9 02/14/2021   LEVETIRACETA 24.1 02/14/2021   No results found for: "PHENYTOIN", "PHENOBARB", "VALPROATE", "CBMZ"  Imaging and Diagnostic studies  MR Orbits,  MRV Head, and MR Brain (with and without contrast for all) obtained July 4th were without acute or typical MS findings.    Impression  [name] [name] is a 41 y.o. male with a history of Behcet's disease, bipolar disorder, hypertension, migraine who presents with painful right sided vision loss with suspected optic neuritis. Somewhat rare to have optic neuritis with Behcets, especially since he does not have any lesions on MRI concerning for neurobehects. He reports significant improvement with 3 doses of IVMP and will continue 5 doses of solumedrol.  Recommendations   -Solu-Medrol 1 g daily x5 days, unable to arrange as outpatient due to it spanning over the weekend. Also he is not comfortable leaving and finishing it outpatient. -Ophthalmology consult completed, outpatient follow up recommended by Dr. Posey Pronto for angiography to rule out retinal circulation affecting central vision loss OD.  He recommends follow up at: [facility] [address] [place], [place] [address] [phone] -Neuro ophthalmology referral will be arranged   ______________________________________________________________   Thank you for the opportunity to take part in the care of this patient. If you have any further questions, please contact the neurology consultation attending.  NEUROHOSPITALIST ADDENDUM Performed a face to face diagnostic evaluation.   I have reviewed the contents of history and physical exam as documented by PA/ARNP/Resident and agree with above documentation.  I have discussed and formulated the above plan as documented. Edits to the note have been  made as needed.  Impression/Key exam findings/Plan: significant improvement in vision with steroids. Started at loss of 90% of central vision and just a 10% rim of peripheral vision intact and now central vision loss is reduced to just 25% with intact vision in 75% peripheral rim. Will continue on course and plan for discharge on Saturday AM. He has close follow up with neurology and ophthalmology. Would benefit from seeing a neuro-ophthalmologist in the future.   No clear data to support continued long-term or taper of steroids in optic neuritis in the setting of behcet's but I think having a neuro-ophthalmologist outpatient weigh in on this would be really helpful.  Donnetta Simpers, MD [facility] [phone]   If 7pm to 7am, please call on call as listed on AMION.  Signed,   [place] Pager Number [phone]

## 2021-10-07 NOTE — Progress Notes (Signed)
Progress Note  Patient: [name] [name] RCV:[id number] DOB: [birth year]  DOA: 10/03/2021  DOS: 10/07/2021    Brief hospital course: HPI: [name] [name] is a pleasant 41 y.o. male with medical history significant for Behcet's disease, seizures, depression, anxiety, hypertension, and OSA, now presenting to the emergency department with right eye pain and vision loss.  Patient reports that he was in his usual state until the night of 10/02/2021 when he developed pain around the right eye with some radiation to the jaw.  The next morning, he had difficulty seeing what time it was and realized that he could see well with his right eye closed, but with his left eye closed, he had central vision loss.  Symptoms persisted and he came into the ED.  He had never experienced this previously.  No fever or chills.  No recent trauma.  No focal numbness or weakness.   ED Course: Upon arrival to the ED, patient is found to be afebrile and saturating well on room air with stable blood pressure.  CMP and CBC were unremarkable, ESR was 8, and CRP undetectable.  He was initially at [facility] and sent to [name] ED for evaluation by ophthalmology and MRI orbits which was negative.  Ophthalmology did dilated exam, did not find any ocular cause for the patient's problem, and neurology was consulted and evaluated the patient.  Primary concern was for optic neuritis and neurology recommended starting 1 g of IV Solu-Medrol daily and checking MRI brain.  Hospital Course: There has been some improvement in pain and vision loss with IV steroid thus far. Neurology is recommending continuing IV steroid empirically for 5 days.   Assessment and Plan: Acute, painful loss of central vision of right eye: Given his chronic immunological condition which is a risk factor for optic neuritis, we are treating for this. MRI and ophthalmology exam reassuring.  - Per neurology, continue IV solumedrol 1g daily for 5 days (7/4-7/8). Unable  to secure this as outpatient either by home health or in infusion clinic. D/w Dr. Lorrin Goodell. Pt has neurology (saw Dr. April Manson at [facility] after Dr. Jannifer Franklin' retirement) and ophthalmology follow up arranged.   Hypertension  - Continue metoprolol     Seizures  - No recent seizure, continue Keppra and Lamictal     Depression, anxiety  - Continue Prozac, Seroquel, Klonopin     Behcet's disease: Followed by rheumatology, Dr. Veneta Penton - Continue Apremilast and Cellcept   History of cardiac arrest: Has ILR from May 2021 for syncope.  Subjective: Visual deficit on right centrally is constant but improving from prior, quantified as the size of a dime, was a half dollar at admission and has consistently decreased since being here. Still moderate pain about and under the right eye.  Objective: Vitals:   10/07/21 0000 10/07/21 0400 10/07/21 0825 10/07/21 1144  BP: (!) 150/88 120/81 110/62 105/63  Pulse: 82 82 92 75  Resp: _0 Temp: 98.2 F (36.8 C) 98.1 F (36.7 C) 98.3 F (36.8 C) 98.2 F (36.8 C)  TempSrc: Oral Oral Oral Oral  SpO2: 97% 97% 95% 95%  Weight:      Height:       Gen: 42 y.o. male in no distress Neuro: Alert, oriented, visual field in left eye is full, right eye has missing sight centrally encompassing about the size of a face at 6 feet away. This is much improved.   Family Communication: None at bedside  Disposition: Status is:  Inpatient Remains inpatient appropriate because: Requires IV steroids x5 days Planned Discharge Destination: Home  Patrecia Pour, MD 10/07/2021 1:59 PM Page by [email or web address]

## 2021-10-08 DIAGNOSIS — K5900 Constipation, unspecified: Secondary | ICD-10-CM

## 2021-10-08 DIAGNOSIS — H469 Unspecified optic neuritis: Secondary | ICD-10-CM | POA: Diagnosis not present

## 2021-10-08 DIAGNOSIS — M352 Behcet's disease: Secondary | ICD-10-CM | POA: Diagnosis not present

## 2021-10-08 DIAGNOSIS — F418 Other specified anxiety disorders: Secondary | ICD-10-CM | POA: Diagnosis not present

## 2021-10-08 LAB — CUP PACEART REMOTE DEVICE CHECK
Date Time Interrogation Session: 20230703230521
Implantable Pulse Generator Implant Date: 20210512

## 2021-10-08 MED ORDER — BISACODYL 10 MG RE SUPP
10.0000 mg | Freq: Once | RECTAL | Status: AC
Start: 2021-10-08 — End: 2021-10-08
  Administered 2021-10-08: 10 mg via RECTAL
  Filled 2021-10-08: qty 1

## 2021-10-08 MED ORDER — SENNOSIDES-DOCUSATE SODIUM 8.6-50 MG PO TABS: 2.0000 | ORAL_TABLET | Freq: Two times a day (BID) | ORAL | 0 refills | Status: DC

## 2021-10-08 MED ORDER — SORBITOL 70 % SOLN
60.0000 mL | Freq: Once | Status: AC
Start: 2021-10-08 — End: 2021-10-08
  Administered 2021-10-08: 60 mL via ORAL
  Filled 2021-10-08: qty 60

## 2021-10-08 MED ORDER — OXYCODONE HCL 5 MG PO TABS: 5.0000 mg | ORAL_TABLET | Freq: Four times a day (QID) | ORAL | 0 refills | Status: AC | PRN

## 2021-10-08 MED ORDER — SENNOSIDES-DOCUSATE SODIUM 8.6-50 MG PO TABS
2.0000 | ORAL_TABLET | Freq: Two times a day (BID) | ORAL | Status: DC
  Administered 2021-10-08: 2 via ORAL
  Filled 2021-10-08: qty 2

## 2021-10-08 MED ORDER — POLYETHYLENE GLYCOL 3350 17 G PO PACK
17.0000 g | PACK | Freq: Every day | ORAL | Status: DC | PRN
  Administered 2021-10-08: 17 g via ORAL
  Filled 2021-10-08: qty 1

## 2021-10-08 NOTE — Progress Notes (Signed)
Patient given AVS and instructed on follow up appointments, medications admin and where to pick up, and when to call provider.  All questions answered.  Patient walked out with mom and refused wheelchair stated he needed to walk.  No s/s of distress noted denies pain.

## 2021-10-08 NOTE — Discharge Summary (Signed)
Physician Discharge Summary   Patient: [name] [name] MRN: [id number] DOB: Jun 22, 1980  Admit date:     10/03/2021  Discharge date: 10/08/21  Discharge Physician: Patrecia Pour   PCP: [facility] [place]   Recommendations at discharge:  Follow up with neurology, ophthalmology, and neuro-ophthalmology after discharge.   Discharge Diagnoses: Principal Problem:   Optic neuritis, right Active Problems:   Essential hypertension   Behcet's disease ([place])   Seizures ([place])   Central loss of vision, right   Depression with anxiety   Acute right eye pain   Constipation  Hospital Course: HPI: [name] [name] is a pleasant 41 y.o. male with medical history significant for Behcet's disease, seizures, depression, anxiety, hypertension, and OSA, now presenting to the emergency department with right eye pain and vision loss.  Patient reports that he was in his usual state until the night of 10/02/2021 when he developed pain around the right eye with some radiation to the jaw.  The next morning, he had difficulty seeing what time it was and realized that he could see well with his right eye closed, but with his left eye closed, he had central vision loss.  Symptoms persisted and he came into the ED.  He had never experienced this previously.  No fever or chills.  No recent trauma.  No focal numbness or weakness.   ED Course: Upon arrival to the ED, patient is found to be afebrile and saturating well on room air with stable blood pressure.  CMP and CBC were unremarkable, ESR was 8, and CRP undetectable.  He was initially at [facility] and sent to [name] ED for evaluation by ophthalmology and MRI orbits which was negative.  Ophthalmology did dilated exam, did not find any ocular cause for the patient's problem, and neurology was consulted and evaluated the patient.  Primary concern was for optic neuritis and neurology recommended starting 1 g of IV Solu-Medrol daily and checking  MRI brain.   Hospital Course: Though imaging did not definitively reveal inflammation, IV steroids were continued for 5 days based on clinical suspicion for optic neuritis with subsequent significant improvement in central right eye vision and pain. Neurology has recommended discharge with plans to follow up as an outpatient with neuro-ophthalmology.    Assessment and Plan: Acute, painful loss of central vision of right eye: Given his chronic immunological condition which is a risk factor for optic neuritis, we are treating for this. MRI and ophthalmology exam reassuring.  - Per neurology, solumedrol 1g IV daily was continued for 5 days (7/4-7/8). With reported "98-99%" improvement in vision and pain, he has been cleared for discharge with driving restrictions until reevaluated after discharge. Unable to secure this as outpatient either by home health or in infusion clinic. D/w Dr. Lorrin Goodell. Pt has neurology (saw Dr. April Manson at [facility] after Dr. Jannifer Franklin' retirement) and ophthalmology follow up arranged. - PDMP reviewed, short course of oxycodone prescribed at discharge for acute pain.   Constipation: Passing flatus, abdominal exam is benign. Had BM charted before he left.  Hypertension  - Continue metoprolol     Seizures  - No recent seizure, continue Keppra and Lamictal     Depression, anxiety  - Continue Prozac, Seroquel, Klonopin     Behcet's disease: Followed by rheumatology, Dr. Veneta Penton - Continue Apremilast and Cellcept    History of cardiac arrest: Has ILR from May 2021 for syncope.  Consultants: Neurology Procedures performed: None  Disposition: Home Diet recommendation:  Cardiac and Carb modified diet DISCHARGE MEDICATION: Allergies as of 10/08/2021   No Known Allergies      Medication List     TAKE these medications    Apremilast 30 MG Tabs Take 30 mg by mouth in the morning and at bedtime.   atorvastatin 80 MG tablet Commonly known as: LIPITOR TAKE 1 TABLET(80 MG) BY  MOUTH DAILY What changed: See the new instructions.   clonazePAM 0.5 MG tablet Commonly known as: KLONOPIN Take 0.25 mg by mouth 3 (three) times daily.   FLUoxetine 40 MG capsule Commonly known as: PROZAC Take 40 mg by mouth daily.   lamoTRIgine 200 MG Tbdp Take 1 tablet (200 mg total) by mouth in the morning and at bedtime.   levETIRAcetam 750 MG tablet Commonly known as: KEPPRA Take 1 tablet (750 mg total) by mouth 2 (two) times daily.   lidocaine 2 % solution Commonly known as: XYLOCAINE Use as directed 15 mLs in the mouth or throat as needed for mouth pain.   melatonin 5 MG Tabs Take 5 mg by mouth at bedtime.   metoprolol tartrate 25 MG tablet Commonly known as: LOPRESSOR TAKE 1/2 TABLET BY MOUTH TWICE DAILY   mycophenolate 500 MG tablet Commonly known as: CELLCEPT Take 1,000 mg by mouth 2 (two) times daily.   oxyCODONE 5 MG immediate release tablet Commonly known as: Oxy IR/ROXICODONE Take 1 tablet (5 mg total) by mouth every 6 (six) hours as needed for up to 5 days for severe pain.   pantoprazole 20 MG tablet Commonly known as: PROTONIX Take 1 tablet (20 mg total) by mouth daily.   QUEtiapine 200 MG tablet Commonly known as: SEROQUEL Take 200 mg by mouth at bedtime.   senna-docusate 8.6-50 MG tablet Commonly known as: Senokot-S Take 2 tablets by mouth 2 (two) times daily.   sucralfate 1 GM/10ML suspension Commonly known as: Carafate Take 10 mLs (1 g total) by mouth 4 (four) times daily -  with meals and at bedtime.        Follow-up Information     [facility]. Schedule an appointment as soon as possible for a visit .   Contact information: [place] [address] [phone]         Alric Ran, MD. Schedule an appointment as soon as possible for a visit.   Specialty: Neurology Contact information: [address] [place] [address] [phone]                Discharge Exam: Danley Danker Weights    10/03/21 2011 10/04/21 2337  Weight: 97.5 kg 97.5 kg  Mildly restricted central vision on the right with intact peripheral vision. Left is normal. PERRL, no APD, no focal weakness or numbness.   Condition at discharge: stable  The results of significant diagnostics from this hospitalization (including imaging, microbiology, ancillary and laboratory) are listed below for reference.   Imaging Studies: CUP PACEART REMOTE DEVICE CHECK  Result Date: 10/08/2021 ILR summary report received. Battery status OK. Normal device function. No new symptom, tachy, brady, or pause episodes. No new AF episodes. Monthly summary reports and ROV/PRN  MR Brain W and Wo Contrast  Result Date: 10/04/2021 CLINICAL DATA:  Acute neurologic deficit EXAM: MRI HEAD WITHOUT AND WITH CONTRAST MR VENOGRAM HEAD WITHOUT AND WITH CONTRAST TECHNIQUE: Multiplanar, multi-echo pulse sequences of the brain and surrounding structures were acquired without and with intravenous contrast. Angiographic images of the intracranial venous structures were acquired using MRV technique without  and with intravenous contrast. CONTRAST:  76mL GADAVIST GADOBUTROL 1 MMOL/ML IV SOLN COMPARISON:  None Available. FINDINGS: MRI HEAD WITHOUT AND WITH CONTRAST Brain: No acute infarct, mass effect or extra-axial collection. No acute or chronic hemorrhage. Normal white matter signal, parenchymal volume and CSF spaces. The midline structures are normal. There is no abnormal contrast enhancement. Vascular: Major flow voids are preserved. Skull and upper cervical spine: Normal calvarium and skull base. Visualized upper cervical spine and soft tissues are normal. Sinuses/Orbits:No paranasal sinus fluid levels or advanced mucosal thickening. No mastoid or middle ear effusion. Normal orbits. MR VENOGRAM HEAD WITHOUT AND WITH CONTRAST There is no evidence of dural venous sinus or deep cerebral vein thrombosis. No dural venous sinus stenosis. Congenitally diminutive right  transverse sinus. IMPRESSION: Normal MRI and MRV of the brain. Electronically Signed   By: Ulyses Jarred M.D.   On: 10/04/2021 22:30   MR Venogram Head  Result Date: 10/04/2021 CLINICAL DATA:  Acute neurologic deficit EXAM: MRI HEAD WITHOUT AND WITH CONTRAST MR VENOGRAM HEAD WITHOUT AND WITH CONTRAST TECHNIQUE: Multiplanar, multi-echo pulse sequences of the brain and surrounding structures were acquired without and with intravenous contrast. Angiographic images of the intracranial venous structures were acquired using MRV technique without and with intravenous contrast. CONTRAST:  60mL GADAVIST GADOBUTROL 1 MMOL/ML IV SOLN COMPARISON:  None Available. FINDINGS: MRI HEAD WITHOUT AND WITH CONTRAST Brain: No acute infarct, mass effect or extra-axial collection. No acute or chronic hemorrhage. Normal white matter signal, parenchymal volume and CSF spaces. The midline structures are normal. There is no abnormal contrast enhancement. Vascular: Major flow voids are preserved. Skull and upper cervical spine: Normal calvarium and skull base. Visualized upper cervical spine and soft tissues are normal. Sinuses/Orbits:No paranasal sinus fluid levels or advanced mucosal thickening. No mastoid or middle ear effusion. Normal orbits. MR VENOGRAM HEAD WITHOUT AND WITH CONTRAST There is no evidence of dural venous sinus or deep cerebral vein thrombosis. No dural venous sinus stenosis. Congenitally diminutive right transverse sinus. IMPRESSION: Normal MRI and MRV of the brain. Electronically Signed   By: Ulyses Jarred M.D.   On: 10/04/2021 22:30   MR ORBITS W WO CONTRAST  Result Date: 10/04/2021 CLINICAL DATA:  41 year old male with right eye pain and vision loss. Behcet's disease EXAM: MRI OF THE ORBITS WITHOUT AND WITH CONTRAST TECHNIQUE: Multiplanar, multi-echo pulse sequences of the orbits and surrounding structures were acquired including fat saturation techniques, before and after intravenous contrast administration.  CONTRAST:  54mL GADAVIST GADOBUTROL 1 MMOL/ML IV SOLN COMPARISON:  Brain MRI 02/13/2019. FINDINGS: Orbits: Suprasellar cistern remains normal, chronic partially empty sella. Normal optic chiasm. Cavernous sinus appears symmetric and within normal limits. Both optic nerves appear within normal limits. No abnormal cranial nerve enhancement. No intraorbital mass or inflammation identified. Extraocular muscles appear within normal limits. Lacrimal glands and globes appear symmetric and normal. Visualized sinuses: Chronic left maxillary sinus mucosal thickening is stable and mild. No sinus fluid levels or worsening aeration. Soft tissues: Negative visible face and scalp soft tissues. Limited intracranial: Major intracranial vascular flow voids are preserved. Visible internal auditory structures appear normal. No intracranial mass effect or ventriculomegaly. Visible gray and white matter signal appears stable and within normal limits. No abnormal enhancement identified. No dural thickening identified. IMPRESSION: Negative MRI appearance of the Orbits, stable compared to 2020 Brain MRI. No explanation for pain or vision loss. Electronically Signed   By: Genevie Ann M.D.   On: 10/04/2021 10:15    Microbiology: Results for orders  placed or performed during the hospital encounter of 09/22/20  Resp Panel by RT-PCR (Flu A&B, Covid) Nasopharyngeal Swab     Status: None   Collection Time: 09/22/20  9:02 PM   Specimen: Nasopharyngeal Swab; Nasopharyngeal(NP) swabs in vial transport medium  Result Value Ref Range Status   SARS Coronavirus 2 by RT PCR NEGATIVE NEGATIVE Final    Comment: (NOTE) SARS-CoV-2 target nucleic acids are NOT DETECTED.  The SARS-CoV-2 RNA is generally detectable in upper respiratory specimens during the acute phase of infection. The lowest concentration of SARS-CoV-2 viral copies this assay can detect is 138 copies/mL. A negative result does not preclude SARS-Cov-2 infection and should not be used  as the sole basis for treatment or other patient management decisions. A negative result may occur with  improper specimen collection/handling, submission of specimen other than nasopharyngeal swab, presence of viral mutation(s) within the areas targeted by this assay, and inadequate number of viral copies(<138 copies/mL). A negative result must be combined with clinical observations, patient history, and epidemiological information. The expected result is Negative.  Fact Sheet for Patients:  EntrepreneurPulse.com.au  Fact Sheet for Healthcare Providers:  IncredibleEmployment.be  This test is no t yet approved or cleared by the [place] FDA and  has been authorized for detection and/or diagnosis of SARS-CoV-2 by FDA under an Emergency Use Authorization (EUA). This EUA will remain  in effect (meaning this test can be used) for the duration of the COVID-19 declaration under Section 564(b)(1) of the Act, 21 U.S.C.section 360bbb-3(b)(1), unless the authorization is terminated  or revoked sooner.       Influenza A by PCR NEGATIVE NEGATIVE Final   Influenza B by PCR NEGATIVE NEGATIVE Final    Comment: (NOTE) The Xpert Xpress SARS-CoV-2/FLU/RSV plus assay is intended as an aid in the diagnosis of influenza from Nasopharyngeal swab specimens and should not be used as a sole basis for treatment. Nasal washings and aspirates are unacceptable for Xpert Xpress SARS-CoV-2/FLU/RSV testing.  Fact Sheet for Patients: EntrepreneurPulse.com.au  Fact Sheet for Healthcare Providers: IncredibleEmployment.be  This test is not yet approved or cleared by the [place] FDA and has been authorized for detection and/or diagnosis of SARS-CoV-2 by FDA under an Emergency Use Authorization (EUA). This EUA will remain in effect (meaning this test can be used) for the duration of the COVID-19 declaration under Section 564(b)(1)  of the Act, 21 U.S.C. section 360bbb-3(b)(1), unless the authorization is terminated or revoked.  Performed at [facility], [address] [place], [place] [address]     Labs: CBC: Recent Labs  Lab 10/03/21 2324 10/05/21 0151  WBC 8.1 9.9  NEUTROABS 5.3  --   HGB 14.4 15.1  HCT 43.4 44.0  MCV 92.5 91.5  PLT 204 073   Basic Metabolic Panel: Recent Labs  Lab 10/03/21 2324 10/05/21 0151  NA 139 135  K 3.5 3.7  CL 102 103  CO2 27 21*  GLUCOSE 87 162*  BUN 13 13  CREATININE 1.04 1.15  CALCIUM 9.9 9.1   Liver Function Tests: Recent Labs  Lab 10/03/21 2324  AST 14*  ALT 21  ALKPHOS 66  BILITOT 0.7  PROT 7.7  ALBUMIN 4.6   CBG: No results for input(s): "GLUCAP" in the last 168 hours.  Discharge time spent: greater than 30 minutes.  Signed: Patrecia Pour, MD [facility] 10/09/2021

## 2021-10-08 NOTE — Plan of Care (Signed)

## 2021-10-08 NOTE — [place] Notes (Signed)
NEUROLOGY CONSULTATION PROGRESS NOTE   Date of service: October 08, 2021 Patient Name: [name] [name] MRN:  [id number] DOB:  1981/04/03  Brief HPI  [name] [name] is a 41 y.o. male with PMH significant for  has a past medical history of Anxiety, Behcet's disease ([place]), Bipolar disorder ([place]), Cardiac arrest ([place]) (12/25/2017), Depression, Hypertension, Migraine, Seizures ([place]), Sleep apnea, and Vitamin D deficiency. who presented on 10/03/21 with painful central vision loss on the right with onset on 10/01/21. Brain MRI and MRV and orbit imaging was negative for acute concerns or MS findings. ESR and CRP were WNL. Optic neuritis suspected. Ophthalmology consulted.    Interval Hx   No acute events Continues on 5 day course of intravenous solumedrol currently scheduled to end today. Today he continues to report improving vision which he describes as 99% improved and now with only a "pea sized black dot" in his central vision as opposed to a dime sized one yesterday. He continues to tolerate solumedrol infusions well. His only other reported concern is constipation with lots of flatulence which is being addressed by primary team.    Vitals   Vitals:   10/07/21 1144 10/07/21 1518 10/08/21 0617 10/08/21 0821  BP: 105/63 109/64 131/85 134/81  Pulse: 75 77 77 80  Resp: '19 20 16 18  ' Temp: 98.2 F (36.8 C) 98.2 F (36.8 C) 98.4 F (36.9 C) 97.9 F (36.6 C)  TempSrc: Oral Oral Oral Oral  SpO2: 95% 94% 93% 94%  Weight:      Height:         Body mass index is 28.37 kg/m.  Physical Exam   General: Sitting up independently on side of bed in NAD.  HENT: Normal oropharynx and mucosa. Normal external appearance of ears and nose.  Pulmonary: Symmetric chest rise. Normal respiratory effort.  Skin: Warm and dry to touch.   Musculoskeletal: Normal digits and nails by inspection. No clubbing.   Neurologic Examination   Constitutional: Appears well-developed middle aged male and well-nourished.     Neuro: Mental Status: Patient is awake, alert, oriented to person, place, month, year, and situation. Patient is able to give a clear and coherent history. No signs of aphasia or neglect Cranial Nerves: II: Visual Fields are full. Pupils are equal, round, and reactive to light.  No afferent pupillary defect III,IV, VI: EOMI without ptosis or diploplia.  V: Facial sensation is symmetric to temperature VII: Facial movement is symmetric.  VIII: hearing is intact to voice X: Uvula elevates symmetrically XI: Shoulder shrug is symmetric. XII: tongue is midline without atrophy or fasciculations.  Motor: Tone is normal. Bulk is normal. 5/5 strength was present in all four extremities.  Sensory: Sensation is symmetric to light touch and temperature in the arms and legs. Cerebellar: FNF and HKS are intact bilaterally Sensation:  Light touch Intact bilat UE and LE   Labs   Lab Results  Component Value Date   ESRSEDRATE 8 10/04/2021       Component Value Date/Time   CRP <0.5 10/04/2021 1059   CRP 1.9 (H) 12/25/2017 2353   CRP <0.5 (L) 02/15/2013 2993    Basic Metabolic Panel:  Lab Results  Component Value Date   NA 135 10/05/2021   K 3.7 10/05/2021   CO2 21 (L) 10/05/2021   GLUCOSE 162 (H) 10/05/2021   BUN 13 10/05/2021   CREATININE 1.15 10/05/2021   CALCIUM 9.1 10/05/2021   GFRNONAA >60 10/05/2021   GFRAA 105 05/20/2020   HbA1c:  Lab Results  Component Value Date   HGBA1C 5.4 07/08/2016   LDL: No results found for: "LDLCALC" Urine Drug Screen:     Component Value Date/Time   LABOPIA NONE DETECTED 12/20/2016 2307   COCAINSCRNUR NONE DETECTED 12/20/2016 2307   COCAINSCRNUR NEGATIVE 04/28/2008 0415   LABBENZ NONE DETECTED 12/20/2016 2307   LABBENZ NEGATIVE 04/28/2008 0415   AMPHETMU NONE DETECTED 12/20/2016 2307   THCU NONE DETECTED 12/20/2016 2307   LABBARB NONE DETECTED 12/20/2016 2307    Alcohol Level     Component Value Date/Time   ETH <5 08/03/2016 1521    Lab Results  Component Value Date   LAMOTRIGINE 5.9 02/14/2021   LEVETIRACETA 24.1 02/14/2021   No results found for: "PHENYTOIN", "PHENOBARB", "VALPROATE", "CBMZ"  Imaging and Diagnostic studies  MR Orbits,  MRV Head, and MR Brain (with and without contrast for all) obtained July 4th were without acute or typical MS findings.    Impression  [name] [name] is a 41 y.o. male with a history of Behcet's disease, bipolar disorder, hypertension, migraine who presents with painful right sided vision loss with suspected optic neuritis. Somewhat rare to have optic neuritis with Behcets, especially since he does not have any lesions on MRI concerning for neurobehects. He reports significant improvement with 5 doses of IVMP.  Recommendations   -Solu-Medrol 1 g daily x5 days, unable to arrange as outpatient due to it spanning over the weekend. Also he is not comfortable leaving and finishing it outpatient. -Ophthalmology consult completed, outpatient follow up recommended by Dr. Posey Pronto for angiography to rule out retinal circulation affecting central vision loss OD. Patient reports he has already been contacted for appt next week.  He recommends follow up at: [facility] [address] [place], [place] [address] [phone] -Neuro ophthalmology referral arranged   Plan of care directed by Dr. Smitty Cords, NP-C, MSN [facility] Please see AMION for contact information for neurologic providers.   ______________________________________________________________   NEUROHOSPITALIST ADDENDUM Performed a face to face diagnostic evaluation.   I have reviewed the contents of history and physical exam as documented by PA/ARNP/Resident and agree with above documentation.  I have discussed and formulated the above plan as documented. Edits to the note have been made as needed.  Impression/Key exam findings/Plan: vision deficit improved by 98-99% with a  small pea of central vision loss in the R eye. He has completed 5/5 doses of solumedrol. Would expect solumedrol to continue working and expect full resolution of vision deficit. He is established with both neurology and ophthalmology outpatient. I presume this episode was autoimmune in nature given significant improvement with steroids. Very little evidence regarding long term immunosuppression. I do think he would benefit from seeing a Neuro-ophthalmologist in the future to get their take on long term immunosuppression or if this recurs.  At this time, we will signoff. Please feel free to contact us with any questions or concerns.  Donnetta Simpers, MD [facility] [phone]   If 7pm to 7am, please call on call as listed on AMION.

## 2021-10-10 ENCOUNTER — Ambulatory Visit (INDEPENDENT_AMBULATORY_CARE_PROVIDER_SITE_OTHER): Payer: Medicare Other

## 2021-10-10 DIAGNOSIS — R55 Syncope and collapse: Secondary | ICD-10-CM

## 2021-10-18 ENCOUNTER — Encounter: Payer: Self-pay | Admitting: Neurology

## 2021-10-18 ENCOUNTER — Ambulatory Visit (INDEPENDENT_AMBULATORY_CARE_PROVIDER_SITE_OTHER): Payer: Medicare Other | Admitting: Neurology

## 2021-10-18 VITALS — BP 122/78 | HR 98 | Ht 72.0 in | Wt 214.0 lb

## 2021-10-18 DIAGNOSIS — M352 Behcet's disease: Secondary | ICD-10-CM

## 2021-10-18 DIAGNOSIS — R569 Unspecified convulsions: Secondary | ICD-10-CM | POA: Diagnosis not present

## 2021-10-18 NOTE — [place] Notes (Signed)
Reason for visit: Behcet's disease, seizures  [name] [name] is an 41 y.o. male   Interval history 10/18/2021:  Patient presents today for follow-up, last visit was in November, at that time he was continued on Keppra and lamotrigine.  He denies any additional seizures but stated 2 weeks ago he was admitted to the hospital for a bout of optic neuritis.  He completed steroid course and his vision is back to his normal self.  Denies any seizure while he was in the hospital. This is the first time he experienced optic neuritis.    Interval History 02/10/21:  This is a 41 year old gentleman previously followed by Dr. Jannifer Franklin for seizure and Behcet's disease, doing well in term of his seizures, last last visit was on September 1, at that time he reported being seizure-free for more than 4 years.  He is currently on Keppra 750 mg twice daily and lamotrigine 200 mg twice daily.  He is presenting today needing a clearance for DMV in order to drive again, no other complaints.    History of present illness: [name] [name] is a 41 year old right-handed white male with a history of Behcet's disease associated with seizures.  The patient has done well with seizure control on a combination of Keppra and Lamictal.  He denies any side effects on the medication.  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  He was on Provigil for severe fatigue after COVID infection but this has improved and he has stopped the medication.  He returns the office today for further evaluation.  Past Medical History:  Diagnosis Date   Anxiety    Behcet's disease ([place])    Bipolar disorder ([place])    Cardiac arrest ([place]) 12/25/2017   one round of CPR    Depression    Hypertension    Migraine    Seizures ([place])    last one 2017   Sleep apnea    CPAP at night   Vitamin D deficiency     Past Surgical History:  Procedure Laterality Date   APPENDECTOMY     CYSTOSCOPY WITH URETHRAL DILATATION N/A 10/15/2019   Procedure: CYSTOSCOPY  WITH URETHRAL DILATATION WITH BALLOON;  Surgeon: Lucas Mallow, MD;  Location: [facility];  Service: Urology;  Laterality: N/A;   FRACTURE SURGERY Right    Arm   LEFT HEART CATH AND CORONARY ANGIOGRAPHY N/A 11/29/2018   Procedure: LEFT HEART CATH AND CORONARY ANGIOGRAPHY;  Surgeon: Martinique, Peter M, MD;  Location: [facility];  Service: Cardiovascular;  Laterality: N/A;   maxilofacial     NISSEN FUNDOPLICATION     RADIOLOGY WITH ANESTHESIA N/A 02/13/2019   Procedure: MRI WITH ANESTHESIA OF BRAIN WITH AND WITHOUT CONTRAST;  Surgeon: Radiologist, Medication, MD;  Location: [facility];  Service: Radiology;  Laterality: N/A;    Family History  Problem Relation Age of Onset   Lung cancer Paternal Grandfather    Other Father        BPPV    Social history:  reports that he has never smoked. He has never used smokeless tobacco. He reports that he does not drink alcohol and does not use drugs.   No Known Allergies  Medications:  Current Meds  Medication Sig   Apremilast 30 MG TABS Take 30 mg by mouth in the morning and at bedtime.   atorvastatin (LIPITOR) 80 MG tablet TAKE 1 TABLET(80 MG) BY MOUTH DAILY (Patient taking differently: Take 80 mg by mouth daily.)  clonazePAM (KLONOPIN) 0.5 MG tablet Take 0.25 mg by mouth 3 (three) times daily.   FLUoxetine (PROZAC) 40 MG capsule Take 40 mg by mouth daily.   lamoTRIgine 200 MG TBDP Take 1 tablet (200 mg total) by mouth in the morning and at bedtime.   levETIRAcetam (KEPPRA) 750 MG tablet Take 1 tablet (750 mg total) by mouth 2 (two) times daily.   melatonin 5 MG TABS Take 5 mg by mouth at bedtime.   metoprolol tartrate (LOPRESSOR) 25 MG tablet TAKE 1/2 TABLET BY MOUTH TWICE DAILY   mycophenolate (CELLCEPT) 500 MG tablet Take 1,000 mg by mouth 2 (two) times daily.   QUEtiapine (SEROQUEL) 200 MG tablet Take 200 mg by mouth at bedtime.   Current Facility-Administered Medications for the 10/18/21 encounter (Office Visit) with Alric Ran, MD   Medication   lidocaine-EPINEPHrine (XYLOCAINE W/EPI) 1 %-1:100000 (with pres) injection 15 mL      ROS:  Out of a complete 14 system review of symptoms, the patient complains only of the following symptoms, and all other reviewed systems are negative.  Skin sores History of seizures  There were no vitals taken for this visit.  Physical Exam  General: The patient is alert and cooperative at the time of the examination.  The patient is moderately obese.  Skin: No significant peripheral edema is noted.   Neurologic Exam  Mental status: The patient is alert and oriented x 3 at the time of the examination. The patient has apparent normal recent and remote memory, with an apparently normal attention span and concentration ability.   Cranial nerves: Facial symmetry is present. Speech is normal, no aphasia or dysarthria is noted. Extraocular movements are full. Visual fields are full.  Motor: The patient has good strength in all 4 extremities.  Sensory examination: Soft touch sensation is symmetric on the face, arms, and legs.  Coordination: The patient has good finger-nose-finger and heel-to-shin bilaterally.  Gait and station: The patient has a normal gait. Tandem gait is normal. Romberg is negative. No drift is seen.  Reflexes: Deep tendon reflexes are symmetric.   Lamotrigine level 5.9 Keppra level 24.1   Assessment/Plan:  1.  Behcet's disease  2.  History of seizures  3. Optic neuritis, resolved  The patient is doing well with seizure control currently.  He will continue on the Lamictal and Keppra. Follow up in 1 year or sooner if worse.    I have spent a total of 30 minutes dedicated to this patient today, preparing to see patient, performing a medically appropriate examination and evaluation, ordering tests and/or medications and procedures, and counseling and educating the patient/family/caregiver; independently interpreting result and communicating results to  the family/patient/caregiver; and documenting clinical information in the electronic medical record.   Alric Ran, MD 10/18/2021 9:21 AM  [facility] [address] [place], [place] [address]  Phone [phone] Fax [phone]

## 2021-10-28 ENCOUNTER — Other Ambulatory Visit: Payer: Self-pay | Admitting: Neurology

## 2021-10-28 DIAGNOSIS — R569 Unspecified convulsions: Secondary | ICD-10-CM

## 2021-11-09 LAB — CUP PACEART REMOTE DEVICE CHECK
Date Time Interrogation Session: 20230805231137
Implantable Pulse Generator Implant Date: 20210512

## 2021-11-09 NOTE — Progress Notes (Signed)
Carelink Summary Report / Loop Recorder 

## 2021-11-14 ENCOUNTER — Ambulatory Visit: Payer: Medicare Other

## 2021-12-19 ENCOUNTER — Ambulatory Visit (INDEPENDENT_AMBULATORY_CARE_PROVIDER_SITE_OTHER): Payer: Medicare Other

## 2021-12-19 DIAGNOSIS — R55 Syncope and collapse: Secondary | ICD-10-CM

## 2021-12-19 LAB — CUP PACEART REMOTE DEVICE CHECK
Date Time Interrogation Session: 20230917231605
Implantable Pulse Generator Implant Date: 20210512

## 2021-12-26 ENCOUNTER — Ambulatory Visit: Payer: Self-pay | Admitting: Surgery

## 2021-12-26 NOTE — [place] Notes (Signed)
Surgery orders requested via Epic inbox. °

## 2021-12-29 NOTE — Progress Notes (Addendum)
Anesthesia Review:  PCP: Jillyn Ledger, NP at [facility]  Cardiologist : Barrington Ellison- LOV 11/29/20  Dr Loleta Chance- cardiologist - Cassell Clement 11/2019  Rheumatology- Simona Huh Ang- LOv 12/12/21  Neurology- Amadau Camara LOV 10/15/21  Device - Loop recorder  Last device check- 12/19/21  Chest x-ray : EKG  01/02/22  Echo : Stress test: Cardiac Cath :  Activity level: can do a flight of stairs without dificulty  Sleep Study/ CPAP : has cpap  Fasting Blood Sugar :      / Checks Blood Sugar -- times a day:   Blood Thinner/ Instructions /Last Dose: ASA / Instructions/ Last Dose :   12/12/21- cbc  Has Behcet's Syndrome  - [facility]  Last seizure- 8 years ago  AT preop appt on 01/02/22- blood pressure was 146/99.  Heart rate- 102- pt stated he had taken am meds. PT states he is nervous.  Heart rate 116 on ekg.  PT denies any chest pain shortness of breath, dizziness or headache at preop appt. Janett Billow WArd, [facility] aware.  No new orders given.

## 2021-12-29 NOTE — Patient Instructions (Signed)
SURGICAL WAITING ROOM VISITATION Patients having surgery or a procedure may have no more than 2 support people in the waiting area - these visitors may rotate.   Children under the age of 8 must have an adult with them who is not the patient. If the patient needs to stay at the hospital during part of their recovery, the visitor guidelines for inpatient rooms apply. Pre-op nurse will coordinate an appropriate time for 1 support person to accompany patient in pre-op.  This support person may not rotate.    Please refer to the [facility] website for the visitor guidelines for Inpatients (after your surgery is over and you are in a regular room).       Your procedure is scheduled on:  01/10/22    Report to [facility] Main Entrance    Report to admitting at  0800 AM   Call this number if you have problems the morning of surgery [phone]   Do not eat food :After Midnight.   After Midnight you may have the following liquids until __ 0700 ____ AM DAY OF SURGERY  Water Non-Citrus Juices (without pulp, NO RED) Carbonated Beverages Black Coffee (NO MILK/CREAM OR CREAMERS, sugar ok)  Clear Tea (NO MILK/CREAM OR CREAMERS, sugar ok) regular and decaf                             Plain Jell-O (NO RED)                                           Fruit ices (not with fruit pulp, NO RED)                                     Popsicles (NO RED)                                                               Sports drinks like Gatorade (NO RED)                      The day of surgery:  Drink ONE (1) Pre-Surgery Clear Ensure or G2 at  0700  AM  ( have completed by ) the morning of surgery. Drink in one sitting. Do not sip.  This drink was given to you during your hospital  pre-op appointment visit. Nothing else to drink after completing the  Pre-Surgery Clear Ensure or G2.          If you have questions, please contact your surgeon's office.       Oral Hygiene is also important to  reduce your risk of infection.                                    Remember - BRUSH YOUR TEETH THE MORNING OF SURGERY WITH YOUR REGULAR TOOTHPASTE   Do NOT smoke after Midnight   Take these medicines the morning of surgery with A SIP OF WATER:  clonazepam, prozac,  keppra, lamotrigine, metoprolol, cellcept   DO NOT TAKE ANY ORAL DIABETIC MEDICATIONS DAY OF YOUR SURGERY  Bring CPAP mask and tubing day of surgery.                              You may not have any metal on your body including hair pins, jewelry, and body piercing             Do not wear make-up, lotions, powders, perfumes/cologne, or deodorant  Do not wear nail polish including gel and S&S, artificial/acrylic nails, or any other type of covering on natural nails including finger and toenails. If you have artificial nails, gel coating, etc. that needs to be removed by a nail salon please have this removed prior to surgery or surgery may need to be canceled/ delayed if the surgeon/ anesthesia feels like they are unable to be safely monitored.   Do not shave  48 hours prior to surgery.               Men may shave face and neck.   Do not bring valuables to the hospital. [place].   Contacts, dentures or bridgework may not be worn into surgery.   Bring small overnight bag day of surgery.   DO NOT [place]. PHARMACY WILL DISPENSE MEDICATIONS LISTED ON YOUR MEDICATION LIST TO YOU DURING YOUR ADMISSION [place]!    Patients discharged on the day of surgery will not be allowed to drive home.  Someone NEEDS to stay with you for the first 24 hours after anesthesia.   Special Instructions: Bring a copy of your healthcare power of attorney and living will documents the day of surgery if you haven't scanned them before.              Please read over the following fact sheets you were given: IF [place] [phone]   If you received a COVID test during your pre-op visit  it is requested that you wear a mask when out in public, stay away from anyone that may not be feeling well and notify your surgeon if you develop symptoms. If you test positive for Covid or have been in contact with anyone that has tested positive in the last 10 days please notify you surgeon.     [place] - Preparing for Surgery Before surgery, you can play an important role.  Because skin is not sterile, your skin needs to be as free of germs as possible.  You can reduce the number of germs on your skin by washing with CHG (chlorahexidine gluconate) soap before surgery.  CHG is an antiseptic cleaner which kills germs and bonds with the skin to continue killing germs even after washing. Please DO NOT use if you have an allergy to CHG or antibacterial soaps.  If your skin becomes reddened/irritated stop using the CHG and inform your nurse when you arrive at Short Stay. Do not shave (including legs and underarms) for at least 48 hours prior to the first CHG shower.  You may shave your face/neck. Please follow these instructions carefully:  1.  Shower with CHG Soap the night before surgery and the  morning of Surgery.  2.  If you choose to wash your hair,  wash your hair first as usual with your  normal  shampoo.  3.  After you shampoo, rinse your hair and body thoroughly to remove the  shampoo.                           4.  Use CHG as you would any other liquid soap.  You can apply chg directly  to the skin and wash                       Gently with a scrungie or clean washcloth.  5.  Apply the CHG Soap to your body ONLY FROM THE NECK DOWN.   Do not use on face/ open                           Wound or open sores. Avoid contact with eyes, ears mouth and genitals (private parts).                       Wash face,  Genitals (private parts) with your normal soap.             6.  Wash thoroughly, paying special attention  to the area where your surgery  will be performed.  7.  Thoroughly rinse your body with warm water from the neck down.  8.  DO NOT shower/wash with your normal soap after using and rinsing off  the CHG Soap.                9.  Pat yourself dry with a clean towel.            10.  Wear clean pajamas.            11.  Place clean sheets on your bed the night of your first shower and do not  sleep with pets. Day of Surgery : Do not apply any lotions/deodorants the morning of surgery.  Please wear clean clothes to the hospital/surgery center.  FAILURE TO FOLLOW THESE INSTRUCTIONS MAY RESULT IN THE CANCELLATION OF YOUR SURGERY PATIENT SIGNATURE_________________________________  NURSE SIGNATURE__________________________________  ________________________________________________________________________

## 2021-12-31 NOTE — Progress Notes (Signed)
Carelink Summary Report / Loop Recorder 

## 2022-01-02 ENCOUNTER — Encounter (HOSPITAL_COMMUNITY): Payer: Self-pay

## 2022-01-02 ENCOUNTER — Other Ambulatory Visit: Payer: Self-pay

## 2022-01-02 ENCOUNTER — Encounter (HOSPITAL_COMMUNITY)
Admission: RE | Admit: 2022-01-02 | Discharge: 2022-01-02 | Disposition: A | Discharge status: Home / Self Care | Payer: Medicare Other | Source: Ambulatory Visit | Attending: Surgery | Admitting: Surgery

## 2022-01-02 VITALS — BP 146/99 | HR 102 | Temp 98.4°F | Resp 16 | Ht 72.0 in | Wt 221.0 lb

## 2022-01-02 DIAGNOSIS — K432 Incisional hernia without obstruction or gangrene: Secondary | ICD-10-CM | POA: Insufficient documentation

## 2022-01-02 DIAGNOSIS — Z01818 Encounter for other preprocedural examination: Secondary | ICD-10-CM | POA: Insufficient documentation

## 2022-01-02 DIAGNOSIS — Z8674 Personal history of sudden cardiac arrest: Secondary | ICD-10-CM | POA: Diagnosis not present

## 2022-01-02 DIAGNOSIS — I10 Essential (primary) hypertension: Secondary | ICD-10-CM | POA: Insufficient documentation

## 2022-01-02 LAB — BASIC METABOLIC PANEL
Anion gap: 9 (ref 5–15)
BUN: 19 mg/dL (ref 6–20)
CO2: 25 mmol/L (ref 22–32)
Calcium: 9.3 mg/dL (ref 8.9–10.3)
Chloride: 106 mmol/L (ref 98–111)
Creatinine, Ser: 1.08 mg/dL (ref 0.61–1.24)
GFR, Estimated: >60 mL/min (ref >60)
Glucose, Bld: 125 mg/dL — ABNORMAL HIGH (ref 70–99)
Potassium: 3.8 mmol/L (ref 3.5–5.1)
Sodium: 140 mmol/L (ref 135–145)

## 2022-01-02 LAB — CBC
HCT: 46.1 % (ref 39.0–52.0)
Hemoglobin: 15.1 g/dL (ref 13.0–17.0)
MCH: 30.8 pg (ref 26.0–34.0)
MCHC: 32.8 g/dL (ref 30.0–36.0)
MCV: 94.1 fL (ref 80.0–100.0)
Platelets: 211 10*3/uL (ref 150–400)
RBC: 4.9 MIL/uL (ref 4.22–5.81)
RDW: 12.1 % (ref 11.5–15.5)
WBC: 9.4 10*3/uL (ref 4.0–10.5)
nRBC: 0 % (ref 0.0–0.2)

## 2022-01-03 ENCOUNTER — Telehealth: Payer: Self-pay | Admitting: Cardiovascular Disease

## 2022-01-03 NOTE — Telephone Encounter (Signed)
Once the pt has been cleared, Dr. Sallyanne Kuster will have his nurse/cma fax over the clearance notes as well as any medication recommendations.

## 2022-01-03 NOTE — Progress Notes (Unsigned)
Cardiology Office Note:    Date:  01/04/2022   ID:  Wannetta Sender, DOB 1981-02-20, MRN [id number]  PCP:  Kristen Loader, FNP  Cardiologist:  Sanda Klein, MD  Electrophysiologist:  Sanda Klein, MD   Referring MD: Kristen Loader, FNP   Chief Complaint  Patient presents with   Pre-op Exam    History of Present Illness:    [name] [name] is a 41 y.o. male with a hx of Behcet sd., bipolar disorder, seizures, migraine headaches, obstructive sleep apnea on CPAP, with a reported cardiac arrest event in September 2019, s/p loop recorder implantation May 2021.    He has not had any syncopal events since loop recorder implantation, but has had occasional episodes of dizziness/near syncope which she generally relates to excessive physical activity.  His loop recorder has never shown any episodes of arrhythmia, either due to automatic recordings or patient driven recordings.  He had an episode of right optic neuritis due to exacerbation of the Behcet syndrome requiring hospitalization and treatment with high-dose intravenous steroids.  He had only peripheral vision in his right eye for several days, but has regained full vision now.  He has mouth and genital sores off and on.  ESR and CRP were not elevated during the episode of optic neuritis.  MRI and MR venogram of the head showed normal findings.  He is chronically on CellCept and Otezla.  His rheumatologist is Dr. Veneta Penton ([facility] in [place]) .   He has been off his lipid-lowering therapy for about 3 months since he ran out of refills. He has a history of mixed hyperlipidemia: Labs performed in June 2021 showed total cholesterol 248, HDL 42, LDL 167, triglycerides 208 (on atorvastatin 80 mg daily, reportedly compliant).    He had a coronary calcium score of 0 in 2021 (he was 39 at the time, under the database limit of 41 years old).  He had no evidence of coronary artery stenosis by cardiac catheterization in 2020.  He is planning to  have ventral hernia surgery later this year.  Upper abdominal wall and week following Nissen fundoplication surgery and subsequent xiphoid process resection surgery.  An electrocardiogram was performed yesterday as part of his preop and showed concerning findings.  He had sinus tachycardia at about 115 bpm, inferior Q waves and leads inferolateral ST segment depression T wave inversion.  All ECG changes, other than the tachycardia, were also present on his previous electrocardiogram from 11/29/2020.  The inferior Q waves have been present on ECGs as far back as 2013, the ST segment depression has been present (albeit with varying degrees of magnitude) since at least 2014.  His blood pressure was also elevated at the time of the examination I suspect that he was anxious.  Today his blood pressure is 114/82 and his heart rate is much slower at 88.  Repeat electrocardiogram in office today shows normal sinus rhythm at a slow rate of only 78 bpm and there are no ST segment changes (he still has inferior Q waves).  He had normal coronary arteries in 2020 by angiography and had a calcium score of 0 in 2021.  Had an essentially normal echocardiogram in 2019.  In 2019 he had an episode of reported cardiac arrest at a swimming pool, reportedly as he was climbing out of the pool.  A lifeguard apparently performed just 1 or 2 chest compressions, but due to altered mental status he was intubated.  No arrhythmia was detected.  ECG was normal and subsequent cardiac work-up has been negative (normal echo and oncological nuclear stress test, culminating in coronary angiography in August 2020 which shows normal left ventricular systolic function and normal coronary anatomy).  A previous event monitor showed only episodes of sinus tachycardia.  No bradycardia and no ventricular arrhythmia.  Past Medical History:  Diagnosis Date   Anxiety    Behcet's disease ([place])    Bipolar disorder ([place])    Cardiac arrest ([place])  12/25/2017   one round of CPR    Depression    Hypertension    Migraine    Seizures ([place])    last one 2017   Sleep apnea    CPAP at night   Vitamin D deficiency     Past Surgical History:  Procedure Laterality Date   APPENDECTOMY     CYSTOSCOPY WITH URETHRAL DILATATION N/A 10/15/2019   Procedure: CYSTOSCOPY WITH URETHRAL DILATATION WITH BALLOON;  Surgeon: Lucas Mallow, MD;  Location: [facility];  Service: Urology;  Laterality: N/A;   FETAL SURGERY FOR CONGENITAL HERNIA     x 2   FRACTURE SURGERY Right    Arm   LEFT HEART CATH AND CORONARY ANGIOGRAPHY N/A 11/29/2018   Procedure: LEFT HEART CATH AND CORONARY ANGIOGRAPHY;  Surgeon: Martinique, Peter M, MD;  Location: [place] [facility];  Service: Cardiovascular;  Laterality: N/A;   maxilofacial     NISSEN FUNDOPLICATION     RADIOLOGY WITH ANESTHESIA N/A 02/13/2019   Procedure: MRI WITH ANESTHESIA OF BRAIN WITH AND WITHOUT CONTRAST;  Surgeon: Radiologist, Medication, MD;  Location: [place];  Service: Radiology;  Laterality: N/A;    Current Medications: Current Meds  Medication Sig   Apremilast 30 MG TABS Take 30 mg by mouth in the morning and at bedtime.   clonazePAM (KLONOPIN) 0.5 MG tablet Take 0.5 mg by mouth 3 (three) times daily.   FLUoxetine (PROZAC) 40 MG capsule Take 40 mg by mouth daily.   lamoTRIgine 200 MG TBDP Take 1 tablet (200 mg total) by mouth in the morning and at bedtime.   levETIRAcetam (KEPPRA) 750 MG tablet TAKE 1 TABLET(750 MG) BY MOUTH TWICE DAILY   melatonin 5 MG TABS Take 5 mg by mouth at bedtime.   metoprolol tartrate (LOPRESSOR) 25 MG tablet TAKE 1/2 TABLET BY MOUTH TWICE DAILY   mycophenolate (CELLCEPT) 500 MG tablet Take 1,000 mg by mouth 2 (two) times daily.   QUEtiapine (SEROQUEL) 200 MG tablet Take 200 mg by mouth at bedtime.   Current Facility-Administered Medications for the 01/04/22 encounter (Office Visit) with Clyde Upshaw, Dani Gobble, MD  Medication   lidocaine-EPINEPHrine (XYLOCAINE W/EPI) 1 %-1:100000  (with pres) injection 15 mL     Allergies:   Patient has no known allergies.   Social History   Socioeconomic History   Marital status: Single    Spouse name: Not on file   Number of children: 0   Years of education: 16   Highest education level: Not on file  Occupational History    Employer: DISABLED  Tobacco Use   Smoking status: Never   Smokeless tobacco: Never  Vaping Use   Vaping Use: Never used  Substance and Sexual Activity   Alcohol use: No   Drug use: No   Sexual activity: Not on file  Other Topics Concern   Not on file  Social History Narrative   Lives with mom and dad   Right Handed   Drinks >10 cups caffeine   Social Determinants of  Health   Financial Resource Strain: Not on file  Food Insecurity: Not on file  Transportation Needs: Not on file  Physical Activity: Not on file  Stress: Not on file  Social Connections: Not on file     Family History: The patient's family history includes Lung cancer in his paternal grandfather; Other in his father.  ROS:   Please see the history of present illness.    All other systems are reviewed and are negative.   EKGs/Labs/Other Studies Reviewed:    The following studies were reviewed today: Coronary angiograms August 2020, nuclear stress test November 2019, echocardiogram September 2019, mobile telemetry October 2019.  Coronary calcium score 12/04/2019 Coronary calcium score of 0 Agatston units. This suggests low risk for future cardiac events.  Cardiac catheterization 11/29/2018 The left ventricular systolic function is normal. LV end diastolic pressure is normal. The left ventricular ejection fraction is 55-65% by visual estimate.   1. Normal coronary anatomy 2. Normal LV function 3. Normal LVEDP   Plan: medical Rx.       EKG:  EKG ordered today shows normal sinus rhythm and no evidence of ST segment abnormalities.  Still has small Q waves in the inferior leads.  QTc 433 ms.  Recent  Labs: 10/03/2021: ALT 21 01/02/2022: BUN 19; Creatinine, Ser 1.08; Hemoglobin 15.1; Platelets 211; Potassium 3.8; Sodium 140  Recent Lipid Panel    Component Value Date/Time   TRIG 405 (H) 12/27/2017 0518  09/09/2019 Total cholesterol 248, HDL 42, LDL 167, triglycerides 208 TSH 1.39  Physical Exam:    VS:  BP 114/82 (BP Location: Left Arm, Patient Position: Sitting, Cuff Size: Large)   Pulse 88   Ht 6' (1.829 m)   Wt 225 lb 6.4 oz (102.2 kg)   SpO2 97%   BMI 30.57 kg/m     Wt Readings from Last 3 Encounters:  01/04/22 225 lb 6.4 oz (102.2 kg)  01/02/22 221 lb (100.2 kg)  10/18/21 214 lb (97.1 kg)       General: Alert, oriented x3, no distress, mildly obese. Head: no evidence of trauma, PERRL, EOMI, no exophtalmos or lid lag, no myxedema, no xanthelasma; normal ears, nose and oropharynx Neck: normal jugular venous pulsations and no hepatojugular reflux; brisk carotid pulses without delay and no carotid bruits Chest: Midline subxiphoid scar.  Clear to auscultation, no signs of consolidation by percussion or palpation, normal fremitus, symmetrical and full respiratory excursions Cardiovascular: normal position and quality of the apical impulse, regular rhythm, normal first and second heart sounds, no murmurs, rubs or gallops Abdomen: no tenderness or distention, no masses by palpation, no abnormal pulsatility or arterial bruits, normal bowel sounds, no hepatosplenomegaly Extremities: no clubbing, cyanosis or edema; 2+ radial, ulnar and brachial pulses bilaterally; 2+ right femoral, posterior tibial and dorsalis pedis pulses; 2+ left femoral, posterior tibial and dorsalis pedis pulses; no subclavian or femoral bruits Neurological: grossly nonfocal Psych: Normal mood and affect    ASSESSMENT:    1. Preoperative cardiovascular examination   2. Syncope and collapse   3. Status post placement of implantable loop recorder   4. Hypercholesterolemia     PLAN:    In order of  problems listed above:  Preop exam: Low risk of cardiovascular complications with planned ventral hernia repair.  He did have some abnormalities on the ECG yesterday but appears to be a rate related phenomenon.  These changes have been present off and on on multiple ECGs going back 10 years.  He has had  a thorough cardiac work-up including recent cardiac catheterization and echocardiogram without evidence of significant structural cardiac abnormalities. Syncope/near-syncope: No recent events.  The events most closely resemble neurally mediated syncope, but the association with physical exertion is puzzling.  No evidence of structural heart disease by echocardiography and coronary angiography.  Reminded him to stay very well-hydrated and to eat a relatively liberal amount of sodium in his diet.  He has normal blood pressure.  To date his loop recorder has not identified any significant arrhythmia. ILR: Site well-healed.  Continue monthly downloads. HLP: He has had no lipid-lowering therapy for the last 3 months after running out of atorvastatin refills.  We will restart this and check labs again in 3 months.  Even while on atorvastatin he still had an elevated LDL cholesterol, but he has not had evidence of vascular disease by angiography and his calcium score is 0 (with the caveat that we do not have a nomogram for patients under the age of 74, I would still interpret this as an excellent result).   Medication Adjustments/Labs and Tests Ordered: Current medicines are reviewed at length with the patient today.  Concerns regarding medicines are outlined above.  Orders Placed This Encounter  Procedures   Lipid panel   EKG 12-Lead   Meds ordered this encounter  Medications   atorvastatin (LIPITOR) 80 MG tablet    Sig: TAKE 1 TABLET(80 MG) BY MOUTH DAILY    Dispense:  30 tablet    Refill:  11    Patient Instructions  Medication Instructions:  No changes *If you need a refill on your cardiac  medications before your next appointment, please call your pharmacy*   Lab Work: Your provider would like for you to have the following labs in December: Lipid  If you have labs (blood work) drawn today and your tests are completely normal, you will receive your results only by: MyChart Message (if you have MyChart) OR A paper copy in the mail If you have any lab test that is abnormal or we need to change your treatment, we will call you to review the results.   Testing/Procedures: None ordered   Follow-Up: At [facility], you and your health needs are our priority.  As part of our continuing mission to provide you with exceptional heart care, we have created designated Provider Care Teams.  These Care Teams include your primary Cardiologist (physician) and Advanced Practice Providers (APPs -  Physician Assistants and Nurse Practitioners) who all work together to provide you with the care you need, when you need it.  We recommend signing up for the patient portal called "MyChart".  Sign up information is provided on this After Visit Summary.  MyChart is used to connect with patients for Virtual Visits (Telemedicine).  Patients are able to view lab/test results, encounter notes, upcoming appointments, etc.  Non-urgent messages can be sent to your provider as well.   To learn more about what you can do with MyChart, go to [email or web address].    Your next appointment:   12 month(s)  The format for your next appointment:   In Person  Provider:   Sanda Klein, MD       Important Information About Sugar          Signed, Sanda Klein, MD  01/04/2022 10:58 AM    [place]

## 2022-01-03 NOTE — Telephone Encounter (Signed)
Primary Cardiologist:Mihai Croitoru, MD  Chart reviewed as part of pre-operative protocol coverage. Because of FABION GATSON past medical history and time since last visit, he/she will require a follow-up visit in order to better assess preoperative cardiovascular risk.  Pre-op covering staff: - Please schedule appointment and call patient to inform them. He is overdue for office visit.  - Please contact requesting surgeon's office via preferred method (i.e, phone, fax) to inform them of need for appointment prior to surgery.  If applicable, this message will also be routed to pharmacy pool and/or primary cardiologist for input on holding anticoagulant/antiplatelet agent as requested below so that this information is available at time of patient's appointment.   Emmaline Life, NP-C  01/03/2022, 12:05 PM [address], [address] Office [phone] Fax [phone]

## 2022-01-03 NOTE — Telephone Encounter (Signed)
   Pre-operative Risk Assessment    Patient Name: [name] [name]  DOB: 29-May-1980 MRN: [id number]      Request for Surgical Clearance    Procedure:  robotic recurrent ventral hernia repair with mesh  Date of Surgery:  Clearance 01/13/22                                 Surgeon:  Dr. Thermon Leyland  Surgeon's Group or Practice Name:  [facility]  Phone number:  [phone] Fax number:  [phone]   Type of Clearance Requested:   - Medical    Type of Anesthesia:  General    Additional requests/questions:    SignedAnnabell Sabal   01/03/2022, 11:47 AM

## 2022-01-03 NOTE — Telephone Encounter (Signed)
Pt agreeable to plan of care for in office with Dr. Barrie Dunker for pre op clearance, as well as per pre op provider, pt is over due for appt with Dr. Sallyanne Kuster.

## 2022-01-03 NOTE — Progress Notes (Signed)
Anesthesia Chart Review   Case: [id number] Date/Time: 01/13/22 0945   Procedure: ROBOTIC RECURRENT VENTRAL HERNIA REPAIR WITH MESH   Anesthesia type: General   Pre-op diagnosis: recurrent ventral hernia   [facility] / [facility]   Surgeons: Felicie Morn, MD       DISCUSSION:41 y.o. never smoker with h/o HTN, sleep apnea, Behcet's disease, seizures controlled on Keppra and Lamictal, recurrent ventral hernia scheduled for above procedure 01/13/2022 with Dr. Louanna Raw.   Followed by neurologist, last seen 10/18/2021.  Stable at this visit with 1 year follow up recommended.   Per cardiology notes, Cardiac arrest event in September 2019, he had an episode of reported cardiac arrest at a swimming pool, reportedly as he was climbing out of the pool.  A lifeguard apparently performed just 1 or 2 chest compressions, but due to altered mental status he was intubated.  No arrhythmia was detected.  ECG was normal and subsequent cardiac work-up has been negative (normal echo and oncological nuclear stress test, culminating in coronary angiography in August 2020 which shows normal left ventricular systolic function and normal coronary anatomy).  A previous event monitor showed only episodes of sinus tachycardia.  No bradycardia and no ventricular arrhythmia.  Pt has followed with cardio annually.  Last seen 11/29/20. Stable at this visit with no changes made.   Cardiac clearance requested.  VS: BP (!) 146/99   Pulse (!) 102   Temp 36.9 C (Oral)   Resp 16   Ht 6' (1.829 m)   Wt 100.2 kg   SpO2 98%   BMI 29.97 kg/m   PROVIDERS: Kristen Loader, FNP is PCP   Primary Cardiologist: Sanda Klein, MD  LABS: Labs reviewed: Acceptable for surgery. (all labs ordered are listed, but only abnormal results are displayed)  Labs Reviewed  BASIC METABOLIC PANEL - Abnormal; Notable for the following components:      Result Value   Glucose, Bld 125 (*)    All other components within  normal limits  CBC     IMAGES:   EKG:   CV:  Cardiac Cath 11/29/2018 The left ventricular systolic function is normal. LV end diastolic pressure is normal. The left ventricular ejection fraction is 55-65% by visual estimate.   1. Normal coronary anatomy 2. Normal LV function 3. Normal LVEDP   Plan: medical Rx.   Myocardial Perfusion 02/22/2018 Nuclear stress EF: 64%. The left ventricular ejection fraction is normal (55-65%). Stress EKG showed no change from baseline EKG showing downsloping ST segment depression in the inferolateral leads The study is normal. This is a low risk study.  Echo 12/26/2017 Study Conclusions   - Left ventricle: The cavity size was normal. Wall thickness was    normal. Systolic function was vigorous. The estimated ejection    fraction was in the range of 65% to 70%. Wall motion was normal;    there were no regional wall motion abnormalities. Doppler    parameters are consistent with abnormal left ventricular    relaxation (grade 1 diastolic dysfunction). The E/e&' ratio is    between 8-15, suggesting indeterminate LV filling pressure.  - Mitral valve: Mildly thickened leaflets . There was trivial    regurgitation.  - Left atrium: The atrium was normal in size.  - Inferior vena cava: The vessel was normal in size. The    respirophasic diameter changes were in the normal range (>= 50%),    consistent with normal central venous pressure. :  Past  Medical History:  Diagnosis Date   Anxiety    Behcet's disease ([place])    Bipolar disorder ([place])    Cardiac arrest ([place]) 12/25/2017   one round of CPR    Depression    Hypertension    Migraine    Seizures ([place])    last one 2017   Sleep apnea    CPAP at night   Vitamin D deficiency     Past Surgical History:  Procedure Laterality Date   APPENDECTOMY     CYSTOSCOPY WITH URETHRAL DILATATION N/A 10/15/2019   Procedure: CYSTOSCOPY WITH URETHRAL DILATATION WITH BALLOON;  Surgeon: Lucas Mallow, MD;  Location[facility];  Service: Urology;  Laterality: N/A;   FETAL SURGERY FOR CONGENITAL HERNIA     x 2   FRACTURE SURGERY Right    Arm   LEFT HEART CATH AND CORONARY ANGIOGRAPHY N/A 11/29/2018   Procedure: LEFT HEART CATH AND CORONARY ANGIOGRAPHY;  Surgeon: Martinique, Peter M, MD;  Location: [place] [facility];  Service: Cardiovascular;  Laterality: N/A;   maxilofacial     NISSEN FUNDOPLICATION     RADIOLOGY WITH ANESTHESIA N/A 02/13/2019   Procedure: MRI WITH ANESTHESIA OF BRAIN WITH AND WITHOUT CONTRAST;  Surgeon: Radiologist, Medication, MD;  Location: [place];  Service: Radiology;  Laterality: N/A;    MEDICATIONS:  Apremilast 30 MG TABS   atorvastatin (LIPITOR) 80 MG tablet   clonazePAM (KLONOPIN) 0.5 MG tablet   FLUoxetine (PROZAC) 40 MG capsule   lamoTRIgine 200 MG TBDP   levETIRAcetam (KEPPRA) 750 MG tablet   melatonin 5 MG TABS   metoprolol tartrate (LOPRESSOR) 25 MG tablet   mycophenolate (CELLCEPT) 500 MG tablet   QUEtiapine (SEROQUEL) 200 MG tablet    lidocaine-EPINEPHrine (XYLOCAINE W/EPI) 1 %-1:100000 (with pres) injection 15 mL    [facility], PA-C [facility]Surgical Testing [phone]

## 2022-01-04 ENCOUNTER — Ambulatory Visit: Payer: Medicare Other | Attending: Cardiovascular Disease | Admitting: Cardiovascular Disease

## 2022-01-04 ENCOUNTER — Encounter: Payer: Self-pay | Admitting: Cardiovascular Disease

## 2022-01-04 VITALS — BP 114/82 | HR 88 | Ht 72.0 in | Wt 225.4 lb

## 2022-01-04 DIAGNOSIS — Z95818 Presence of other cardiac implants and grafts: Secondary | ICD-10-CM | POA: Diagnosis not present

## 2022-01-04 DIAGNOSIS — Z01810 Encounter for preprocedural cardiovascular examination: Secondary | ICD-10-CM | POA: Insufficient documentation

## 2022-01-04 DIAGNOSIS — E7800 Pure hypercholesterolemia, unspecified: Secondary | ICD-10-CM | POA: Insufficient documentation

## 2022-01-04 DIAGNOSIS — R55 Syncope and collapse: Secondary | ICD-10-CM | POA: Insufficient documentation

## 2022-01-04 MED ORDER — ATORVASTATIN CALCIUM 80 MG PO TABS: ORAL_TABLET | ORAL | 11 refills | Status: DC

## 2022-01-04 NOTE — Patient Instructions (Addendum)
Medication Instructions:  No changes *If you need a refill on your cardiac medications before your next appointment, please call your pharmacy*   Lab Work: Your provider would like for you to have the following labs in December: Lipid  If you have labs (blood work) drawn today and your tests are completely normal, you will receive your results only by: [place] (if you have MyChart) OR A paper copy in the mail If you have any lab test that is abnormal or we need to change your treatment, we will call you to review the results.   Testing/Procedures: None ordered   Follow-Up: At [facility], you and your health needs are our priority.  As part of our continuing mission to provide you with exceptional heart care, we have created designated Provider Care Teams.  These Care Teams include your primary Cardiologist (physician) and Advanced Practice Providers (APPs -  Physician Assistants and Nurse Practitioners) who all work together to provide you with the care you need, when you need it.  We recommend signing up for the patient portal called "MyChart".  Sign up information is provided on this After Visit Summary.  MyChart is used to connect with patients for Virtual Visits (Telemedicine).  Patients are able to view lab/test results, encounter notes, upcoming appointments, etc.  Non-urgent messages can be sent to your provider as well.   To learn more about what you can do with MyChart, go to [email or web address].    Your next appointment:   12 month(s)  The format for your next appointment:   In Person  Provider:   Sanda Klein, MD       Important Information About Sugar

## 2022-01-05 NOTE — Anesthesia Preprocedure Evaluation (Addendum)
Anesthesia Evaluation  Patient identified by MRN, date of birth, ID band Patient awake    Reviewed: Allergy & Precautions, NPO status , Patient's Chart, lab work & pertinent test results  Airway Mallampati: I  TM Distance: >3 FB Neck ROM: Full    Dental  (+) Chipped,    Pulmonary sleep apnea and Continuous Positive Airway Pressure Ventilation ,    Pulmonary exam normal        Cardiovascular hypertension, Pt. on home beta blockers Normal cardiovascular exam     Neuro/Psych  Headaches, Seizures -, Well Controlled,  PSYCHIATRIC DISORDERS Anxiety Depression Bipolar Disorder    GI/Hepatic negative GI ROS, Neg liver ROS,   Endo/Other  negative endocrine ROS  Renal/GU negative Renal ROS     Musculoskeletal negative musculoskeletal ROS (+)   Abdominal   Peds  Hematology negative hematology ROS (+)   Anesthesia Other Findings recurrent ventral hernia  Reproductive/Obstetrics                           Anesthesia Physical Anesthesia Plan  ASA: 3  Anesthesia Plan: General   Post-op Pain Management:    Induction: Intravenous  PONV Risk Score and Plan: 2 and Ondansetron, Dexamethasone, Midazolam and Treatment may vary due to age or medical condition  Airway Management Planned: Oral ETT  Additional Equipment:   Intra-op Plan:   Post-operative Plan: Extubation in OR  Informed Consent: I have reviewed the patients History and Physical, chart, labs and discussed the procedure including the risks, benefits and alternatives for the proposed anesthesia with the patient or authorized representative who has indicated his/her understanding and acceptance.     Dental advisory given  Plan Discussed with: CRNA  Anesthesia Plan Comments: (PAT note 01/02/2022)      Anesthesia Quick Evaluation

## 2022-01-13 ENCOUNTER — Ambulatory Visit (HOSPITAL_COMMUNITY): Payer: Medicare Other | Admitting: Physician Assistant

## 2022-01-13 ENCOUNTER — Encounter (HOSPITAL_COMMUNITY): Payer: Self-pay | Admitting: Surgery

## 2022-01-13 ENCOUNTER — Other Ambulatory Visit: Payer: Self-pay

## 2022-01-13 ENCOUNTER — Inpatient Hospital Stay (HOSPITAL_COMMUNITY)
Admission: RE | Admit: 2022-01-13 | Discharge: 2022-01-15 | DRG: 355 | Disposition: A | Discharge status: Home / Self Care | Payer: Medicare Other | Attending: Surgery | Admitting: Surgery

## 2022-01-13 ENCOUNTER — Ambulatory Visit (HOSPITAL_BASED_OUTPATIENT_CLINIC_OR_DEPARTMENT_OTHER): Payer: Medicare Other | Admitting: Physician Assistant

## 2022-01-13 ENCOUNTER — Encounter (HOSPITAL_COMMUNITY)
Admission: RE | Disposition: A | Discharge status: Home / Self Care | Payer: Self-pay | Source: Home / Self Care | Attending: Surgery

## 2022-01-13 DIAGNOSIS — G4733 Obstructive sleep apnea (adult) (pediatric): Secondary | ICD-10-CM | POA: Diagnosis not present

## 2022-01-13 DIAGNOSIS — Z9049 Acquired absence of other specified parts of digestive tract: Secondary | ICD-10-CM

## 2022-01-13 DIAGNOSIS — Z9989 Dependence on other enabling machines and devices: Secondary | ICD-10-CM | POA: Diagnosis not present

## 2022-01-13 DIAGNOSIS — K432 Incisional hernia without obstruction or gangrene: Secondary | ICD-10-CM | POA: Diagnosis not present

## 2022-01-13 DIAGNOSIS — I10 Essential (primary) hypertension: Secondary | ICD-10-CM | POA: Diagnosis present

## 2022-01-13 DIAGNOSIS — Z01818 Encounter for other preprocedural examination: Secondary | ICD-10-CM

## 2022-01-13 DIAGNOSIS — Z8674 Personal history of sudden cardiac arrest: Secondary | ICD-10-CM

## 2022-01-13 DIAGNOSIS — Z79899 Other long term (current) drug therapy: Secondary | ICD-10-CM

## 2022-01-13 DIAGNOSIS — F319 Bipolar disorder, unspecified: Secondary | ICD-10-CM | POA: Diagnosis present

## 2022-01-13 DIAGNOSIS — E559 Vitamin D deficiency, unspecified: Secondary | ICD-10-CM | POA: Diagnosis present

## 2022-01-13 DIAGNOSIS — F419 Anxiety disorder, unspecified: Secondary | ICD-10-CM | POA: Diagnosis present

## 2022-01-13 DIAGNOSIS — G4730 Sleep apnea, unspecified: Secondary | ICD-10-CM | POA: Diagnosis present

## 2022-01-13 HISTORY — PX: XI ROBOTIC ASSISTED VENTRAL HERNIA: SHX6789

## 2022-01-13 LAB — CBC
HCT: 43 % (ref 39.0–52.0)
Hemoglobin: 14.1 g/dL (ref 13.0–17.0)
MCH: 31.2 pg (ref 26.0–34.0)
MCHC: 32.8 g/dL (ref 30.0–36.0)
MCV: 95.1 fL (ref 80.0–100.0)
Platelets: 158 10*3/uL (ref 150–400)
RBC: 4.52 MIL/uL (ref 4.22–5.81)
RDW: 11.9 % (ref 11.5–15.5)
WBC: 12.8 10*3/uL — ABNORMAL HIGH (ref 4.0–10.5)
nRBC: 0 % (ref 0.0–0.2)

## 2022-01-13 LAB — CREATININE, SERUM
Creatinine, Ser: 1.07 mg/dL (ref 0.61–1.24)
GFR, Estimated: >60 mL/min (ref >60)

## 2022-01-13 SURGERY — REPAIR, HERNIA, VENTRAL, ROBOT-ASSISTED
Anesthesia: General

## 2022-01-13 MED ORDER — FENTANYL CITRATE (PF) 100 MCG/2ML IJ SOLN
INTRAMUSCULAR | Status: DC | PRN
  Administered 2022-01-13: 100 ug via INTRAVENOUS
  Administered 2022-01-13: 50 ug via INTRAVENOUS
  Administered 2022-01-13: 100 ug via INTRAVENOUS

## 2022-01-13 MED ORDER — APREMILAST 30 MG PO TABS
30.0000 mg | ORAL_TABLET | Freq: Two times a day (BID) | ORAL | Status: DC
  Administered 2022-01-15: 30 mg via ORAL

## 2022-01-13 MED ORDER — GABAPENTIN 300 MG PO CAPS
300.0000 mg | ORAL_CAPSULE | ORAL | Status: AC
  Administered 2022-01-13: 300 mg via ORAL
  Filled 2022-01-13: qty 1

## 2022-01-13 MED ORDER — OXYCODONE HCL 5 MG PO TABS
5.0000 mg | ORAL_TABLET | ORAL | Status: DC | PRN
  Administered 2022-01-13: 5 mg via ORAL
  Filled 2022-01-13: qty 1

## 2022-01-13 MED ORDER — GABAPENTIN 300 MG PO CAPS
300.0000 mg | ORAL_CAPSULE | Freq: Three times a day (TID) | ORAL | Status: DC
  Administered 2022-01-13 – 2022-01-15 (×5): 300 mg via ORAL
  Filled 2022-01-13 (×5): qty 1

## 2022-01-13 MED ORDER — FLUOXETINE HCL 20 MG PO CAPS
40.0000 mg | ORAL_CAPSULE | Freq: Every day | ORAL | Status: DC
  Administered 2022-01-13 – 2022-01-15 (×3): 40 mg via ORAL
  Filled 2022-01-13 (×3): qty 2

## 2022-01-13 MED ORDER — ROCURONIUM BROMIDE 10 MG/ML (PF) SYRINGE
PREFILLED_SYRINGE | INTRAVENOUS | Status: AC
  Filled 2022-01-13: qty 10

## 2022-01-13 MED ORDER — FENTANYL CITRATE (PF) 250 MCG/5ML IJ SOLN
INTRAMUSCULAR | Status: AC
  Filled 2022-01-13: qty 5

## 2022-01-13 MED ORDER — ENOXAPARIN SODIUM 40 MG/0.4ML IJ SOSY
40.0000 mg | PREFILLED_SYRINGE | Freq: Once | INTRAMUSCULAR | Status: AC
  Administered 2022-01-13: 40 mg via SUBCUTANEOUS
  Filled 2022-01-13: qty 0.4

## 2022-01-13 MED ORDER — CHLORHEXIDINE GLUCONATE CLOTH 2 % EX PADS: 6.0000 | MEDICATED_PAD | Freq: Once | CUTANEOUS | Status: DC

## 2022-01-13 MED ORDER — LACTATED RINGERS IV SOLN: INTRAVENOUS | Status: DC

## 2022-01-13 MED ORDER — BUPIVACAINE LIPOSOME 1.3 % IJ SUSP
INTRAMUSCULAR | Status: DC | PRN
  Administered 2022-01-13: 20 mL

## 2022-01-13 MED ORDER — ONDANSETRON HCL 4 MG/2ML IJ SOLN
INTRAMUSCULAR | Status: AC
  Filled 2022-01-13: qty 2

## 2022-01-13 MED ORDER — BUPIVACAINE-EPINEPHRINE 0.25% -1:200000 IJ SOLN
INTRAMUSCULAR | Status: DC | PRN
  Administered 2022-01-13: 30 mL

## 2022-01-13 MED ORDER — LAMOTRIGINE 100 MG PO TABS
200.0000 mg | ORAL_TABLET | Freq: Two times a day (BID) | ORAL | Status: DC
  Administered 2022-01-13 – 2022-01-15 (×4): 200 mg via ORAL
  Filled 2022-01-13 (×4): qty 2

## 2022-01-13 MED ORDER — 0.9 % SODIUM CHLORIDE (POUR BTL) OPTIME
TOPICAL | Status: DC | PRN
  Administered 2022-01-13: 1000 mL

## 2022-01-13 MED ORDER — OXYCODONE HCL 5 MG/5ML PO SOLN: 5.0000 mg | Freq: Once | ORAL | Status: DC | PRN

## 2022-01-13 MED ORDER — ONDANSETRON HCL 4 MG/2ML IJ SOLN: 4.0000 mg | Freq: Four times a day (QID) | INTRAMUSCULAR | Status: DC | PRN

## 2022-01-13 MED ORDER — CHLORHEXIDINE GLUCONATE 0.12 % MT SOLN
15.0000 mL | Freq: Once | OROMUCOSAL | Status: AC
  Administered 2022-01-13: 15 mL via OROMUCOSAL

## 2022-01-13 MED ORDER — MELATONIN 5 MG PO TABS
5.0000 mg | ORAL_TABLET | Freq: Every day | ORAL | Status: DC
  Administered 2022-01-13 – 2022-01-14 (×2): 5 mg via ORAL
  Filled 2022-01-13 (×2): qty 1

## 2022-01-13 MED ORDER — DOCUSATE SODIUM 100 MG PO CAPS
100.0000 mg | ORAL_CAPSULE | Freq: Two times a day (BID) | ORAL | Status: DC
  Administered 2022-01-13 – 2022-01-15 (×4): 100 mg via ORAL
  Filled 2022-01-13 (×4): qty 1

## 2022-01-13 MED ORDER — PHENYLEPHRINE HCL-NACL 20-0.9 MG/250ML-% IV SOLN
INTRAVENOUS | Status: DC | PRN
  Administered 2022-01-13: 30 ug/min via INTRAVENOUS

## 2022-01-13 MED ORDER — MIDAZOLAM HCL 2 MG/2ML IJ SOLN
INTRAMUSCULAR | Status: AC
  Filled 2022-01-13: qty 2

## 2022-01-13 MED ORDER — LIDOCAINE HCL (PF) 2 % IJ SOLN
INTRAMUSCULAR | Status: AC
  Filled 2022-01-13: qty 5

## 2022-01-13 MED ORDER — BUPIVACAINE-EPINEPHRINE (PF) 0.25% -1:200000 IJ SOLN
INTRAMUSCULAR | Status: AC
  Filled 2022-01-13: qty 30

## 2022-01-13 MED ORDER — ACETAMINOPHEN 500 MG PO TABS
1000.0000 mg | ORAL_TABLET | ORAL | Status: AC
  Administered 2022-01-13: 1000 mg via ORAL
  Filled 2022-01-13: qty 2

## 2022-01-13 MED ORDER — KETOROLAC TROMETHAMINE 30 MG/ML IJ SOLN
INTRAMUSCULAR | Status: AC
  Filled 2022-01-13: qty 1

## 2022-01-13 MED ORDER — KETAMINE HCL 10 MG/ML IJ SOLN
INTRAMUSCULAR | Status: DC | PRN
  Administered 2022-01-13: 40 mg via INTRAVENOUS
  Administered 2022-01-13: 10 mg via INTRAVENOUS

## 2022-01-13 MED ORDER — ORAL CARE MOUTH RINSE: 15.0000 mL | Freq: Once | OROMUCOSAL | Status: AC

## 2022-01-13 MED ORDER — PROMETHAZINE HCL 25 MG/ML IJ SOLN: 6.2500 mg | INTRAMUSCULAR | Status: DC | PRN

## 2022-01-13 MED ORDER — DEXAMETHASONE SODIUM PHOSPHATE 4 MG/ML IJ SOLN
INTRAMUSCULAR | Status: DC | PRN
  Administered 2022-01-13: 10 mg via INTRAVENOUS

## 2022-01-13 MED ORDER — MYCOPHENOLATE MOFETIL 250 MG PO CAPS
1000.0000 mg | ORAL_CAPSULE | Freq: Two times a day (BID) | ORAL | Status: DC
  Administered 2022-01-13 – 2022-01-15 (×4): 1000 mg via ORAL
  Filled 2022-01-13 (×4): qty 4

## 2022-01-13 MED ORDER — OXYCODONE-ACETAMINOPHEN 5-325 MG PO TABS: 1.0000 | ORAL_TABLET | ORAL | 0 refills | Status: DC | PRN

## 2022-01-13 MED ORDER — KETAMINE HCL 50 MG/5ML IJ SOSY
PREFILLED_SYRINGE | INTRAMUSCULAR | Status: AC
  Filled 2022-01-13: qty 5

## 2022-01-13 MED ORDER — ONDANSETRON HCL 4 MG/2ML IJ SOLN
INTRAMUSCULAR | Status: DC | PRN
  Administered 2022-01-13: 4 mg via INTRAVENOUS

## 2022-01-13 MED ORDER — LEVETIRACETAM 500 MG PO TABS
750.0000 mg | ORAL_TABLET | Freq: Two times a day (BID) | ORAL | Status: DC
  Administered 2022-01-13 – 2022-01-15 (×4): 750 mg via ORAL
  Filled 2022-01-13 (×4): qty 1

## 2022-01-13 MED ORDER — KETOROLAC TROMETHAMINE 15 MG/ML IJ SOLN
15.0000 mg | INTRAMUSCULAR | Status: AC
  Administered 2022-01-13: 15 mg via INTRAVENOUS

## 2022-01-13 MED ORDER — METOPROLOL TARTRATE 12.5 MG HALF TABLET
12.5000 mg | ORAL_TABLET | Freq: Two times a day (BID) | ORAL | Status: DC
  Administered 2022-01-13 – 2022-01-15 (×4): 12.5 mg via ORAL
  Filled 2022-01-13 (×4): qty 1

## 2022-01-13 MED ORDER — LIDOCAINE 2% (20 MG/ML) 5 ML SYRINGE
INTRAMUSCULAR | Status: DC | PRN
  Administered 2022-01-13: 60 mg via INTRAVENOUS

## 2022-01-13 MED ORDER — ENOXAPARIN SODIUM 40 MG/0.4ML IJ SOSY
40.0000 mg | PREFILLED_SYRINGE | INTRAMUSCULAR | Status: DC
  Administered 2022-01-14 – 2022-01-15 (×2): 40 mg via SUBCUTANEOUS
  Filled 2022-01-13 (×2): qty 0.4

## 2022-01-13 MED ORDER — OXYCODONE HCL 5 MG PO TABS: 5.0000 mg | ORAL_TABLET | Freq: Once | ORAL | Status: DC | PRN

## 2022-01-13 MED ORDER — BUPIVACAINE LIPOSOME 1.3 % IJ SUSP
INTRAMUSCULAR | Status: AC
  Filled 2022-01-13: qty 20

## 2022-01-13 MED ORDER — PHENYLEPHRINE 80 MCG/ML (10ML) SYRINGE FOR IV PUSH (FOR BLOOD PRESSURE SUPPORT)
PREFILLED_SYRINGE | INTRAVENOUS | Status: DC | PRN
  Administered 2022-01-13: 80 ug via INTRAVENOUS
  Administered 2022-01-13: 120 ug via INTRAVENOUS
  Administered 2022-01-13: 80 ug via INTRAVENOUS
  Administered 2022-01-13: 120 ug via INTRAVENOUS
  Administered 2022-01-13: 80 ug via INTRAVENOUS
  Administered 2022-01-13: 120 ug via INTRAVENOUS

## 2022-01-13 MED ORDER — BUPIVACAINE LIPOSOME 1.3 % IJ SUSP: 20.0000 mL | Freq: Once | INTRAMUSCULAR | Status: DC

## 2022-01-13 MED ORDER — ACETAMINOPHEN 325 MG PO TABS
650.0000 mg | ORAL_TABLET | Freq: Four times a day (QID) | ORAL | Status: DC
  Administered 2022-01-13 – 2022-01-15 (×6): 650 mg via ORAL
  Filled 2022-01-13 (×6): qty 2

## 2022-01-13 MED ORDER — PROCHLORPERAZINE EDISYLATE 10 MG/2ML IJ SOLN: 10.0000 mg | INTRAMUSCULAR | Status: DC | PRN

## 2022-01-13 MED ORDER — MIDAZOLAM HCL 5 MG/5ML IJ SOLN
INTRAMUSCULAR | Status: DC | PRN
  Administered 2022-01-13: 2 mg via INTRAVENOUS

## 2022-01-13 MED ORDER — AMISULPRIDE (ANTIEMETIC) 5 MG/2ML IV SOLN: 10.0000 mg | Freq: Once | INTRAVENOUS | Status: DC | PRN

## 2022-01-13 MED ORDER — PHENYLEPHRINE 80 MCG/ML (10ML) SYRINGE FOR IV PUSH (FOR BLOOD PRESSURE SUPPORT)
PREFILLED_SYRINGE | INTRAVENOUS | Status: AC
  Filled 2022-01-13: qty 10

## 2022-01-13 MED ORDER — PHENYLEPHRINE HCL (PRESSORS) 10 MG/ML IV SOLN
INTRAVENOUS | Status: AC
  Filled 2022-01-13: qty 1

## 2022-01-13 MED ORDER — OXYCODONE HCL 5 MG PO TABS
10.0000 mg | ORAL_TABLET | ORAL | Status: DC | PRN
  Administered 2022-01-13 – 2022-01-15 (×5): 10 mg via ORAL
  Filled 2022-01-13 (×5): qty 2

## 2022-01-13 MED ORDER — KETOROLAC TROMETHAMINE 15 MG/ML IJ SOLN
15.0000 mg | Freq: Three times a day (TID) | INTRAMUSCULAR | Status: DC
  Administered 2022-01-13 – 2022-01-15 (×5): 15 mg via INTRAVENOUS
  Filled 2022-01-13 (×5): qty 1

## 2022-01-13 MED ORDER — PROPOFOL 10 MG/ML IV BOLUS
INTRAVENOUS | Status: DC | PRN
  Administered 2022-01-13: 200 mg via INTRAVENOUS

## 2022-01-13 MED ORDER — SUGAMMADEX SODIUM 200 MG/2ML IV SOLN
INTRAVENOUS | Status: DC | PRN
  Administered 2022-01-13: 200 mg via INTRAVENOUS

## 2022-01-13 MED ORDER — HYDROMORPHONE HCL 1 MG/ML IJ SOLN
0.5000 mg | INTRAMUSCULAR | Status: DC | PRN
  Administered 2022-01-13: 0.5 mg via INTRAVENOUS
  Filled 2022-01-13: qty 0.5

## 2022-01-13 MED ORDER — FENTANYL CITRATE PF 50 MCG/ML IJ SOSY
PREFILLED_SYRINGE | INTRAMUSCULAR | Status: AC
  Filled 2022-01-13: qty 1

## 2022-01-13 MED ORDER — FENTANYL CITRATE PF 50 MCG/ML IJ SOSY
25.0000 ug | PREFILLED_SYRINGE | INTRAMUSCULAR | Status: DC | PRN
  Administered 2022-01-13 (×2): 50 ug via INTRAVENOUS

## 2022-01-13 MED ORDER — METOPROLOL TARTRATE 25 MG PO TABS
12.5000 mg | ORAL_TABLET | Freq: Once | ORAL | Status: AC
  Administered 2022-01-13: 12.5 mg via ORAL
  Filled 2022-01-13: qty 1

## 2022-01-13 MED ORDER — QUETIAPINE FUMARATE 50 MG PO TABS
200.0000 mg | ORAL_TABLET | Freq: Every day | ORAL | Status: DC
  Administered 2022-01-13 – 2022-01-14 (×2): 200 mg via ORAL
  Filled 2022-01-13 (×2): qty 4

## 2022-01-13 MED ORDER — CEFAZOLIN SODIUM-DEXTROSE 2-4 GM/100ML-% IV SOLN
2.0000 g | INTRAVENOUS | Status: AC
  Administered 2022-01-13: 2 g via INTRAVENOUS
  Filled 2022-01-13: qty 100

## 2022-01-13 MED ORDER — PROPOFOL 10 MG/ML IV BOLUS
INTRAVENOUS | Status: AC
  Filled 2022-01-13: qty 20

## 2022-01-13 MED ORDER — LACTATED RINGERS IV SOLN: INTRAVENOUS | Status: DC | PRN

## 2022-01-13 MED ORDER — KETOROLAC TROMETHAMINE 30 MG/ML IJ SOLN: 30.0000 mg | Freq: Once | INTRAMUSCULAR | Status: DC | PRN

## 2022-01-13 MED ORDER — CLONAZEPAM 0.5 MG PO TABS
0.5000 mg | ORAL_TABLET | Freq: Three times a day (TID) | ORAL | Status: DC
  Administered 2022-01-13 – 2022-01-15 (×5): 0.5 mg via ORAL
  Filled 2022-01-13 (×5): qty 1

## 2022-01-13 MED ORDER — DEXAMETHASONE SODIUM PHOSPHATE 10 MG/ML IJ SOLN
INTRAMUSCULAR | Status: AC
  Filled 2022-01-13: qty 1

## 2022-01-13 MED ORDER — ATORVASTATIN CALCIUM 40 MG PO TABS
80.0000 mg | ORAL_TABLET | Freq: Every day | ORAL | Status: DC
  Administered 2022-01-13 – 2022-01-15 (×3): 80 mg via ORAL
  Filled 2022-01-13 (×3): qty 2

## 2022-01-13 MED ORDER — SIMETHICONE 80 MG PO CHEW: 80.0000 mg | CHEWABLE_TABLET | Freq: Four times a day (QID) | ORAL | Status: DC | PRN

## 2022-01-13 MED ORDER — METHOCARBAMOL 1000 MG/10ML IJ SOLN
500.0000 mg | Freq: Four times a day (QID) | INTRAVENOUS | Status: DC | PRN
  Administered 2022-01-13: 500 mg via INTRAVENOUS
  Filled 2022-01-13: qty 500

## 2022-01-13 MED ORDER — ROCURONIUM BROMIDE 10 MG/ML (PF) SYRINGE
PREFILLED_SYRINGE | INTRAVENOUS | Status: DC | PRN
  Administered 2022-01-13 (×2): 20 mg via INTRAVENOUS
  Administered 2022-01-13: 30 mg via INTRAVENOUS
  Administered 2022-01-13: 50 mg via INTRAVENOUS
  Administered 2022-01-13: 30 mg via INTRAVENOUS

## 2022-01-13 SURGICAL SUPPLY — 70 items
ADH SKN CLS APL DERMABOND .7 (GAUZE/BANDAGES/DRESSINGS) ×1
APL PRP STRL LF DISP 70% ISPRP (MISCELLANEOUS) ×1
BAG COUNTER SPONGE SURGICOUNT (BAG) ×2 IMPLANT
BAG SPNG CNTER NS LX DISP (BAG) ×1
BLADE SURG SZ11 CARB STEEL (BLADE) ×2 IMPLANT
CHLORAPREP W/TINT 26 (MISCELLANEOUS) ×2 IMPLANT
COVER MAYO STAND STRL (DRAPES) ×2 IMPLANT
COVER TIP SHEARS 8 DVNC (MISCELLANEOUS) ×2 IMPLANT
COVER TIP SHEARS 8MM DA VINCI (MISCELLANEOUS) ×1
DERMABOND ADVANCED .7 DNX12 (GAUZE/BANDAGES/DRESSINGS) IMPLANT
DEVICE TROCAR PUNCTURE CLOSURE (ENDOMECHANICALS) IMPLANT
DRAPE ARM DVNC X/XI (DISPOSABLE) ×6 IMPLANT
DRAPE COLUMN DVNC XI (DISPOSABLE) ×2 IMPLANT
DRAPE DA VINCI XI ARM (DISPOSABLE) ×3
DRAPE DA VINCI XI COLUMN (DISPOSABLE) ×1
ELECT L-HOOK LAP 45CM DISP (ELECTROSURGICAL)
ELECT PENCIL ROCKER SW 15FT (MISCELLANEOUS) ×2 IMPLANT
ELECT REM PT RETURN 15FT ADLT (MISCELLANEOUS) ×2 IMPLANT
ELECTRODE L-HOOK LAP 45CM DISP (ELECTROSURGICAL) ×2 IMPLANT
GLOVE BIO SURGEON STRL SZ7.5 (GLOVE) ×4 IMPLANT
GLOVE BIOGEL PI IND STRL 8 (GLOVE) ×4 IMPLANT
GOWN STRL REUS W/ TWL XL LVL3 (GOWN DISPOSABLE) ×6 IMPLANT
GOWN STRL REUS W/TWL XL LVL3 (GOWN DISPOSABLE) ×3
GRASPER SUT TROCAR 14GX15 (MISCELLANEOUS) IMPLANT
IRRIG SUCT STRYKERFLOW 2 WTIP (MISCELLANEOUS)
IRRIGATION SUCT STRKRFLW 2 WTP (MISCELLANEOUS) IMPLANT
KIT BASIN OR (CUSTOM PROCEDURE TRAY) ×2 IMPLANT
KIT TURNOVER KIT A (KITS) IMPLANT
L-HOOK LAP DISP 36CM (ELECTROSURGICAL) ×1
LHOOK LAP DISP 36CM (ELECTROSURGICAL) IMPLANT
MANIFOLD NEPTUNE II (INSTRUMENTS) ×2 IMPLANT
MESH SOFT 12X12IN BARD (Mesh General) IMPLANT
NDL SPNL 18GX3.5 QUINCKE PK (NEEDLE) ×2 IMPLANT
NEEDLE SPNL 18GX3.5 QUINCKE PK (NEEDLE) ×1 IMPLANT
PACK CARDIOVASCULAR III (CUSTOM PROCEDURE TRAY) ×2 IMPLANT
SEAL CANN UNIV 5-8 DVNC XI (MISCELLANEOUS) ×6 IMPLANT
SEAL XI 5MM-8MM UNIVERSAL (MISCELLANEOUS) ×3
SEALER VESSEL DA VINCI XI (MISCELLANEOUS)
SEALER VESSEL EXT DVNC XI (MISCELLANEOUS) IMPLANT
SET TUBE SMOKE EVAC HIGH FLOW (TUBING) ×2 IMPLANT
SLEEVE Z-THREAD 5X100MM (TROCAR) IMPLANT
SOL ANTI FOG 6CC (MISCELLANEOUS) ×2 IMPLANT
SOLUTION ANTI FOG 6CC (MISCELLANEOUS) ×1
SOLUTION ELECTROLUBE (MISCELLANEOUS) ×2 IMPLANT
SPIKE FLUID TRANSFER (MISCELLANEOUS) ×2 IMPLANT
SUT MNCRL AB 4-0 PS2 18 (SUTURE) ×2 IMPLANT
SUT SILK 0 SH 30 (SUTURE) IMPLANT
SUT STRAFIX PDS 18 CTX (SUTURE) IMPLANT
SUT STRAFIX SPIRAL 2-0 3 (SUTURE) IMPLANT
SUT STRAFIX SPIRAL 2-0 5 (SUTURE) IMPLANT
SUT STRAFIX SPIRAL 2-0 9 (SUTURE) IMPLANT
SUT STRAFIX SYMMETRIC 0-0 12 (SUTURE)
SUT STRAFIX SYMMETRIC 0-0 18 (SUTURE)
SUT STRAFIX SYMMETRIC 0-0 24 (SUTURE)
SUT STRAFIX SYMMETRIC 1-0 12 (SUTURE)
SUT STRAFIX SYMMETRIC 1-0 24 (SUTURE) ×1
SUT VICRYL 0 UR6 27IN ABS (SUTURE) IMPLANT
SUTURE STRAFIX SYMMETRC 0-0 12 (SUTURE) IMPLANT
SUTURE STRAFIX SYMMETRC 0-0 18 (SUTURE) IMPLANT
SUTURE STRAFIX SYMMETRC 0-0 24 (SUTURE) IMPLANT
SUTURE STRAFIX SYMMETRC 1-0 12 (SUTURE) IMPLANT
SUTURE STRAFIX SYMMETRC 1-0 24 (SUTURE) IMPLANT
SYR 20ML LL LF (SYRINGE) ×2 IMPLANT
TAPE STRIPS DRAPE STRL (GAUZE/BANDAGES/DRESSINGS) ×2 IMPLANT
TOWEL OR 17X26 10 PK STRL BLUE (TOWEL DISPOSABLE) ×2 IMPLANT
TOWEL OR NON WOVEN STRL DISP B (DISPOSABLE) IMPLANT
TROCAR ADV FIXATION 12X100MM (TROCAR) ×2 IMPLANT
TROCAR Z-THREAD FIOS 5X100MM (TROCAR) ×2 IMPLANT
TROCAR Z-THREAD OPTICAL 5X100M (TROCAR) IMPLANT
TUBING INSUFFLATION 10FT LAP (TUBING) ×2 IMPLANT

## 2022-01-13 NOTE — Transfer of Care (Signed)
Immediate Anesthesia Transfer of Care Note  Patient: [name] [name]  Procedure(s) Performed: ROBOTIC RECURRENT VENTRAL HERNIA REPAIR WITH MESH  Patient Location: PACU  Anesthesia Type:General  Level of Consciousness: awake, alert  and oriented  Airway & Oxygen Therapy: Patient Spontanous Breathing and Patient connected to face mask oxygen  Post-op Assessment: Report given to RN and Post -op Vital signs reviewed and stable  Post vital signs: Reviewed and stable  Last Vitals:  Vitals Value Taken Time  BP 131/78 01/13/22 1432  Temp 36.9 C 01/13/22 1432  Pulse 87 01/13/22 1433  Resp 23 01/13/22 1433  SpO2 100 % 01/13/22 1433  Vitals shown include unvalidated device data.  Last Pain:  Vitals:   01/13/22 0805  TempSrc: Oral         Complications: No notable events documented.

## 2022-01-13 NOTE — Anesthesia Procedure Notes (Signed)
Procedure Name: Intubation Date/Time: 01/13/2022 11:52 AM  Performed by: Claudia Desanctis, CRNAPre-anesthesia Checklist: Patient identified, Emergency Drugs available, Suction available and Patient being monitored Patient Re-evaluated:Patient Re-evaluated prior to induction Oxygen Delivery Method: Circle system utilized Preoxygenation: Pre-oxygenation with 100% oxygen Induction Type: IV induction Ventilation: Mask ventilation without difficulty Laryngoscope Size: Miller and 3 Grade View: Grade I Tube type: Oral Tube size: 8.0 mm Number of attempts: 1 Airway Equipment and Method: Stylet Placement Confirmation: ETT inserted through vocal cords under direct vision, positive ETCO2 and breath sounds checked- equal and bilateral Secured at: 22 cm Tube secured with: Tape Dental Injury: Teeth and Oropharynx as per pre-operative assessment

## 2022-01-13 NOTE — Anesthesia Postprocedure Evaluation (Signed)
Anesthesia Post Note  Patient: [name] [name]  Procedure(s) Performed: ROBOTIC RECURRENT VENTRAL HERNIA REPAIR WITH MESH     Patient location during evaluation: PACU Anesthesia Type: General Level of consciousness: awake Pain management: pain level controlled Vital Signs Assessment: post-procedure vital signs reviewed and stable Respiratory status: spontaneous breathing, nonlabored ventilation, respiratory function stable and patient connected to nasal cannula oxygen Cardiovascular status: blood pressure returned to baseline and stable Postop Assessment: no apparent nausea or vomiting Anesthetic complications: no   No notable events documented.  Last Vitals:  Vitals:   01/13/22 1644 01/13/22 1750  BP: 117/77 127/83  Pulse: 86 92  Resp: 16 18  Temp: 36.9 C   SpO2: 100% 100%    Last Pain:  Vitals:   01/13/22 1714  TempSrc:   PainSc: 6                  Mavrik Bynum P Arlissa Monteverde

## 2022-01-13 NOTE — Op Note (Signed)
Patient: [name] [name] (Sep 01, 1980, [id number])  Date of Surgery: 01/13/2022   Preoperative Diagnosis: Recurrent ventral hernia   Postoperative Diagnosis: Recurrent ventral hernia   Surgical Procedure: ROBOTIC RECURRENT VENTRAL HERNIA REPAIR WITH MESH:   Bilateral posterior rectus myofascial release   Operative Team Members:  Surgeon(s) and Role:    * Yoni Lobos, Nickola Major, MD - Primary   Anesthesiologist: Murvin Natal, MD CRNA: Claudia Desanctis, CRNA   Anesthesia: General   Fluids:  Total I/O In: 1900 [I.V.:1800; IV Piggyback:100] Out: 10 [QPRFF:63]  Complications: None  Drains:  None  Specimen: None  Disposition:  PACU - hemodynamically stable.  Plan of Care: Admit for overnight observation  Indications for Procedure: [name] [name] is a 41 y.o. male who presented with a recurrent incisional hernia.  He had previous xiphoid resection. He developed a hernia there which was repaired primarily.  He now presented with a recurrent hernia in this area.  I recommended robotic ventral hernia repair with mesh.  The procedure itself as well as the risks, benefits and alternatives were described.  The risks discussed included but were not limited to the risk of infection, bleeding, damage to nearby structures, recurrent hernia, chronic pain, and mesh complication requiring removal.  After a full discussion and all questions answered, the patient granted consent to proceed.  Findings:  Hernia Location: Ventral hernia location: Subxiphoid (M1) Hernia Size:  9cm tall x7cm wide with multiple small swiss cheese type defects Mesh Size &Type:  30 cm tall x 18 cm wide Bard Soft Mesh Mesh Position: Sublay - Retromuscular Myofascial Releases: Bilateral posterior rectus myofascial releases  Description of Procedure: The patient was positioned supine, moderately flexed at the umbilical level, padded and secured on the operating table.  A timeout procedure was performed.    What is  described is a robotic, totally extraperitoneal retromuscular incisional hernia repair with bilateral rectus myofascial release and retromuscular mesh placement. I used a variation in my technique to dock at the level at the umbilicus and work cephalad to dissect under the diaphragm for subxiphoid defect and coverage.  Laparoscopic Portion: The retrorectus space was entered in the LEFT hypochondrium, at approximately the midclavicular line utilizing a 5 mm optical-viewing trocar.  Upon safe entry into this space, it was insufflated while performing a blunt dissection with the camera still in the optical trocar. A rectus myofascial release was performed on the LEFT side. Dissection was carried out laterally in the retromuscular plane to the edge of the rectus sheath progressively disconnecting the rectus muscle from the underlying posterior rectus sheath. Both the segmental innervation as well as the intercostal artery and vein brances to the rectus muscle were individually preserved.    During the left sided retrorectus dissection, a 12 mm trocar was placed over placed the original 5 mm trocar and a second 8 mm robotic trocar was placed in the retromuscular space on the left about at the level of the umbilicus.  This was placed in the lateral most edge of the retrorectus space.   With these initial trocars in position, the medial most aspect of the retrorectus plane was identified, and the posterior sheath was visualized as it inserted on the linea alba. The posterior sheath was incised with cautery entering the preperitoneal plane. A crossover was performed dissecting under the linea alba in the preperitoneal plane until the right rectus sheath was identified.  During this cross over the peritoneum was entered.  A 5 mm trocar was placed in  the right upper quadrant to evacuate the peritoneum.  After identification of the right rectus sheath, it was incised vertically to enter the retrorectus space on the  right. A rectus myofascial release was performed on the RIGHT side.  Blunt dissection was carried out laterally in the retromuscular plane to the edge of the rectus sheath progressively disconnecting the rectus muscle from the underlying posterior rectus sheath. Both the segmental innervation as well as the intercostal artery and vein brances to the rectus muscle were individually preserved.   At this juncture, both retrorectus planes were initially connected to each other and there was space for further trocar placement. An 8 mm robotic trocar was placed in the midclavicular line in right retrorectus space at the level of the umbilicus.  A 68m robotic trocar was placed supraumbilically.   Robotic Portion: The Intuitive daVinci Xi surgical robot was docked in the standard fashion and the procedure begun from the robotic console. A Prograsp instrument and monopolar shears were used for the dissection.  Dissection was carried superiorly preserving the peritoneum and the preperitoneal fat in the midline as it was gently dissected off of the overlying linea alba.  On the right side, the posterior rectus sheath was progressively disconnected from its insertion on the linea alba. This allowed for progression of the right side rectus myofascial release.  The rectus myofascial release accomplished medialization of the posterior rectus sheath towards the midline and disinsertion of the rectus muscle from its surrounding fascia, and thus its encasement in the rectus sheath, allowing for widening of the rectus muscle and transfer of the rectus flap towards the midline.  This will allow for future inset of the medial aspect of the flap for abdominal wall reconstruction.  Similarly, on the left side, the posterior rectus sheath was also progressively disconnected from its insertion on the linea alba.  This allowed for progression of the left side rectus myofascial release.  The rectus myofascial release accomplished  medialization of the posterior rectus sheath towards the midline and disinsertion of the rectus muscle from its surrounding fascia, and thus its encasement in the rectus sheath, allowing for widening of the rectus muscle and transfer of the rectus flap towards the midline.  This will allow for future inset of the medial aspect of the flap for abdominal wall reconstruction.  During the dissection of the midline the hernia defect was identified and the hernia sac was reduced to preserve integrity of the visceral sac.  Both the left and the right rectus myofascial releases were performed towards the rib cage.  Since the rectus muscle inserts anterior to the rib cage, dissection was performed to drop out of the retromuscular space and into the preperitoneal space just inferior to the rib cage.  I divided the posterior rectus sheath and the transversus abdominis muscle to connect the preperitoneal space to the retromuscular space at this level.  The preperitoneal dissection was then continued up underneath the diaphragm for adequate mesh overlap with the subxiphoid hernia defect.  The hernia contained fatty preperitoneal tissue.  This was able to be fully reduced without entering the peritoneum.  The hernia defect area was now visualized fully.  The hernia defects were located in the subxiphoid region. Utilizing a metric ruler, the defect are was measured intracorporeally to be 7 cm horizontal by 9 cm vertical.  The hernia defect was closed utilizing a continuous, #1 Ethicon Stratafix Symmetric PDS Plus suture.  The hernia defect, and subsequently the rectus musculature, came together well  for a complete abdominal wall reconstruction.  This closure was continued to plicate the diastases of the upper abdomen down to the level near the umbilicus.  The dissected out retrorectus space was measured with a metric ruler so as to determine the size of the proposed mesh.    The robot was undocked and the laparoscope  was inserted, inspecting for hemostasis.  The mesh deployment was performed laparoscopically.  Laparoscopic Portion:   A piece of Bard soft was opened and trimmed to 30 cmx 18 cm. The mesh was advanced into the retrorectus space and the mesh positioned flat against the intact posterior rectus sheaths. The mesh was not fixated as it occupied the entire retromuscular plane, and also covered all of the trocars.  The trocars were removed and the skin closed with 4-0 Monocryl subcuticular sutures and skin glue.   Louanna Raw, MD General, Bariatric, & Minimally Invasive Surgery [facility], [place]

## 2022-01-13 NOTE — H&P (Signed)
Admitting Physician: Nickola Major Mare Ludtke  Service: General surgery  CC: hernia  Subjective   HPI: [name] [name] is an 41 y.o. male who is here for robotic recurrent hernia repair.  Past Medical History:  Diagnosis Date   Anxiety    Behcet's disease ([place])    Bipolar disorder ([place])    Cardiac arrest ([place]) 12/25/2017   one round of CPR    Depression    Hypertension    Migraine    Seizures ([place])    last one 2017   Sleep apnea    CPAP at night   Vitamin D deficiency     Past Surgical History:  Procedure Laterality Date   APPENDECTOMY     CYSTOSCOPY WITH URETHRAL DILATATION N/A 10/15/2019   Procedure: CYSTOSCOPY WITH URETHRAL DILATATION WITH BALLOON;  Surgeon: Lucas Mallow, MD;  Location: [facility];  Service: Urology;  Laterality: N/A;   FETAL SURGERY FOR CONGENITAL HERNIA     x 2   FRACTURE SURGERY Right    Arm   LEFT HEART CATH AND CORONARY ANGIOGRAPHY N/A 11/29/2018   Procedure: LEFT HEART CATH AND CORONARY ANGIOGRAPHY;  Surgeon: Martinique, Peter M, MD;  Location: [facility];  Service: Cardiovascular;  Laterality: N/A;   maxilofacial     NISSEN FUNDOPLICATION     RADIOLOGY WITH ANESTHESIA N/A 02/13/2019   Procedure: MRI WITH ANESTHESIA OF BRAIN WITH AND WITHOUT CONTRAST;  Surgeon: Radiologist, Medication, MD;  Location: [facility];  Service: Radiology;  Laterality: N/A;    Family History  Problem Relation Age of Onset   Lung cancer Paternal Grandfather    Other Father        BPPV    Social:  reports that he has never smoked. He has never used smokeless tobacco. He reports that he does not drink alcohol and does not use drugs.  Allergies: No Known Allergies  Medications: Current Outpatient Medications  Medication Instructions   Apremilast 30 mg, Oral, 2 times daily   atorvastatin (LIPITOR) 80 MG tablet TAKE 1 TABLET(80 MG) BY MOUTH DAILY   clonazePAM (KLONOPIN) 0.5 mg, Oral, 3 times daily   FLUoxetine (PROZAC) 40 mg, Oral, Daily   lamoTRIgine 200 mg,  Oral, 2 times daily   levETIRAcetam (KEPPRA) 750 MG tablet TAKE 1 TABLET(750 MG) BY MOUTH TWICE DAILY   melatonin 5 mg, Oral, Daily at bedtime   metoprolol tartrate (LOPRESSOR) 25 MG tablet TAKE 1/2 TABLET BY MOUTH TWICE DAILY   mycophenolate (CELLCEPT) 1,000 mg, Oral, 2 times daily   QUEtiapine (SEROQUEL) 200 mg, Oral, Daily at bedtime    ROS - all of the below systems have been reviewed with the patient and positives are indicated with bold text General: chills, fever or night sweats Eyes: blurry vision or double vision ENT: epistaxis or sore throat Allergy/Immunology: itchy/watery eyes or nasal congestion Hematologic/Lymphatic: bleeding problems, blood clots or swollen lymph nodes Endocrine: temperature intolerance or unexpected weight changes Breast: new or changing breast lumps or nipple discharge Resp: cough, shortness of breath, or wheezing CV: chest pain or dyspnea on exertion GI: as per HPI GU: dysuria, trouble voiding, or hematuria MSK: joint pain or joint stiffness Neuro: TIA or stroke symptoms Derm: pruritus and skin lesion changes Psych: anxiety and depression  Objective   PE Blood pressure 113/84, pulse 85, temperature 97.8 F (36.6 C), temperature source Oral, resp. rate 16, height 6' (1.829 m), weight 102.2 kg, SpO2 100 %. Constitutional: NAD; conversant; no deformities Eyes: Moist conjunctiva; no  lid lag; anicteric; PERRL Neck: Trachea midline; no thyromegaly Lungs: Normal respiratory effort; no tactile fremitus CV: RRR; no palpable thrills; no pitting edema GI: Abd Subxiphoid hernia; no palpable hepatosplenomegaly MSK: Normal range of motion of extremities; no clubbing/cyanosis Psychiatric: Appropriate affect; alert and oriented x3 Lymphatic: No palpable cervical or axillary lymphadenopathy  No results found for this or any previous visit (from the past 24 hour(s)).  Imaging Orders  No imaging studies ordered today   CT abd/pel 03/06/21:  Radiology  impression: No acute abnormality of the abdomen or pelvis.  My impression: Small hernia defect subxiphoid approximately 2cm wide by 1 cm tall on CT with fat protruding to the right of midline    Assessment and Plan   Assessment and Plan:   Diagnoses and all orders for this visit:  Ventral hernia without obstruction or gangrene  Subxiphoid defect approximately 2 cm x 1 cm on CT No umbilical defect noted on CT, possibly tiny umbilical hernia defect on exam  I recommended a robotic ventral hernia repair with mesh. I explained the benefit of being able to dissect underneath the diaphragm and lay a mesh to cover the subxiphoid region. I explained that I would dissect out the umbilicus during this procedure and plicate the rectus muscles treating the diastases. I also explained that I would use the mesh to completely cover the previous incision. I described the surgery itself as well as its risk, benefits, and alternatives. I discussed the risk of continued pain after surgery. After full discussion all questions answered the patient granted consent to proceed.    Felicie Morn, MD  [facility] Use [email or web address] to contact on call provider

## 2022-01-13 NOTE — Discharge Instructions (Signed)
 VENTRAL HERNIA REPAIR POST OPERATIVE INSTRUCTIONS  Thinking Clearly  The anesthesia may cause you to feel different for 1 or 2 days. Do not drive, drink alcohol, or make any big decisions for at least 2 days.  Nutrition When you wake up, you will be able to drink small amounts of liquid. If you do not feel sick, you can slowly advance your diet to regular foods. Continue to drink lots of fluids, usually about 8 to 10 glasses per day. Eat a high-fiber diet so you don't strain during bowel movements. High-Fiber Foods Foods high in fiber include beans, bran cereals and whole-grain breads, peas, dried fruit (figs, apricots, and dates), raspberries, blackberries, strawberries, sweet corn, broccoli, baked potatoes with skin, plums, pears, apples, greens, and nuts. Activity Slowly increase your activity. Be sure to get up and walk every hour or so to prevent blood clots. No heavy lifting or strenuous activity for 4 weeks following surgery to prevent hernias at your incision sites or recurrence of your hernia. It is normal to feel tired. You may need more sleep than usual.  Get your rest but make sure to get up and move around frequently to prevent blood clots and pneumonia.  Work and Return to School You can go back to work when you feel well enough. Discuss the timing with your surgeon. You can usually go back to school or work 1 week or less after an laparoscopic or an open repair. If your work requires heavy lifting or strenuous activity you need to be placed on light duty for 4 weeks following surgery. You can return to gym class, sports or other physical activities 4 weeks after surgery.  Wound Care You may experience significant bruising throughout the abdominal wall that may track down into the groin including into the scrotum in males.  Rest, elevating the groin and scrotum above the level of the heart, ice and compression with tight fitting underwear or an abdominal binder can help.   Always wash your hands before and after touching near your incision site. Do not soak in a bathtub until cleared at your follow up appointment. You may take a shower 24 hours after surgery. A small amount of drainage from the incision is normal. If the drainage is thick and yellow or the site is red, you may have an infection, so call your surgeon. If you have a drain in one of your incisions, it will be taken out in office when the drainage stops. Steri-Strips will fall off in 7 to 10 days or they will be removed during your first office visit. If you have dermabond glue covering over the incision, allow the glue to flake off on its own. Protect the new skin, especially from the sun. The sun can burn and cause darker scarring. Your scar will heal in about 4 to 6 weeks and will become softer and continue to fade over the next year.  The cosmetic appearance of the incisions will improve over the course of the first year after surgery. Sensation around your incision will return in a few weeks or months.  Bowel Movements After intestinal surgery, you may have loose watery stools for several days. If watery diarrhea lasts longer than 3 days, contact your surgeon. Pain medication (narcotics) can cause constipation. Increase the fiber in your diet with high-fiber foods if you are constipated. You can take an over the counter stool softener like Colace to avoid constipation.  Additional over the counter medications can also be used   if Colace isn't sufficient (for example, Milk of Magnesia or Miralax).  Pain The amount of pain is different for each person. Some people need only 1 to 3 doses of pain control medication, while others need more. Take alternating doses of tylenol and ibuprofen around the clock for the first five days following surgery.  This will provide a baseline of pain control and help with inflammation.  Take the narcotic pain medication in addition if needed for severe pain.  Contact  Your Surgeon at [phone], if you have: Pain that will not go away Pain that gets worse A fever of more than 101F (38.3C) Repeated vomiting Swelling, redness, bleeding, or bad-smelling drainage from your wound site Strong abdominal pain No bowel movement or unable to pass gas for 3 days Watery diarrhea lasting longer than 3 days  Pain Control The goal of pain control is to minimize pain, keep you moving and help you heal. Your surgical team will work with you on your pain plan. Most often a combination of therapies and medications are used to control your pain. You may also be given medication (local anesthetic) at the surgical site. This may help control your pain for several days. Extreme pain puts extra stress on your body at a time when your body needs to focus on healing. Do not wait until your pain has reached a level "10" or is unbearable before telling your doctor or nurse. It is much easier to control pain before it becomes severe. Following a laparoscopic procedure, pain is sometimes felt in the shoulder. This is due to the gas inserted into your abdomen during the procedure. Moving and walking helps to decrease the gas and the right shoulder pain.  Use the guide below for ways to manage your post-operative pain. Learn more by going to facs.org/safepaincontrol.  How Intense Is My Pain Common Therapies to Feel Better       I hardly notice my pain, and it does not interfere with my activities.  I notice my pain and it distracts me, but I can still do activities (sitting up, walking, standing).  Non-Medication Therapies  Ice (in a bag, applied over clothing at the surgical site), elevation, rest, meditation, massage, distraction (music, TV, play) walking and mild exercise Splinting the abdomen with pillows +  Non-Opioid Medications Acetaminophen (Tylenol) Non-steroidal anti-inflammatory drugs (NSAIDS) Aspirin, Ibuprofen (Motrin, Advil) Naproxen (Aleve) Take these as  needed, when you feel pain. Both acetaminophen and NSAIDs help to decrease pain and swelling (inflammation).      My pain is hard to ignore and is more noticeable even when I rest.  My pain interferes with my usual activities.  Non-Medication Therapies  +  Non-Opioid medications  Take on a regular schedule (around-the-clock) instead of as needed. (For example, Tylenol every 6 hours at 9:00 am, 3:00 pm, 9:00 pm, 3:00 am and Motrin every 6 hours at 12:00 am, 6:00 am, 12:00 pm, 6:00 pm)         I am focused on my pain, and I am not doing my daily activities.  I am groaning in pain, and I cannot sleep. I am unable to do anything.  My pain is as bad as it could be, and nothing else matters.  Non-Medication Therapies  +  Around-the-Clock Non-Opioid Medications  +  Short-acting opioids  Opioids should be used with other medications to manage severe pain. Opioids block pain and give a feeling of euphoria (feel high). Addiction, a serious side effect of opioids, is   rare with short-term (a few days) use.  Examples of short-acting opioids include: Tramadol (Ultram), Hydrocodone (Norco, Vicodin), Hydromorphone (Dilaudid), Oxycodone (Oxycontin)     The above directions have been adapted from the [employer] Surgical Patient Education Program.  Please refer to the ACS website if needed: https://www.facs.org/-/media/files/education/[email or web address]-ed/[email or web address]_[email or web address].ashx   Aarion Kittrell, MD [facility] [address], [address], [place], [place]  [address] ?  [address] [address], [place], [place]   [address] [phone] ? [phone] ? FAX [phone] Web site: [email or web address]  

## 2022-01-14 DIAGNOSIS — F419 Anxiety disorder, unspecified: Secondary | ICD-10-CM | POA: Diagnosis present

## 2022-01-14 DIAGNOSIS — F319 Bipolar disorder, unspecified: Secondary | ICD-10-CM | POA: Diagnosis present

## 2022-01-14 DIAGNOSIS — G4730 Sleep apnea, unspecified: Secondary | ICD-10-CM | POA: Diagnosis present

## 2022-01-14 DIAGNOSIS — Z8674 Personal history of sudden cardiac arrest: Secondary | ICD-10-CM | POA: Diagnosis not present

## 2022-01-14 DIAGNOSIS — K432 Incisional hernia without obstruction or gangrene: Secondary | ICD-10-CM | POA: Diagnosis present

## 2022-01-14 DIAGNOSIS — Z9049 Acquired absence of other specified parts of digestive tract: Secondary | ICD-10-CM | POA: Diagnosis not present

## 2022-01-14 DIAGNOSIS — Z79899 Other long term (current) drug therapy: Secondary | ICD-10-CM | POA: Diagnosis not present

## 2022-01-14 DIAGNOSIS — I10 Essential (primary) hypertension: Secondary | ICD-10-CM | POA: Diagnosis present

## 2022-01-14 DIAGNOSIS — E559 Vitamin D deficiency, unspecified: Secondary | ICD-10-CM | POA: Diagnosis present

## 2022-01-14 NOTE — Progress Notes (Signed)
Mobility Specialist - Progress Note   01/14/22 1129  Mobility  Activity Ambulated with assistance in hallway  Level of Assistance Independent after set-up  Assistive Device  (IV Pole)  Distance Ambulated (ft) 250 ft  Activity Response Tolerated well  Mobility Referral Yes  $Mobility charge 1 Mobility   Pt received in bed and agreeable to mobility. No complaints during mobility.  Pt to EOB w/ food tray after session with all needs met.    [facility]

## 2022-01-14 NOTE — Progress Notes (Signed)
1 Day Post-Op   Subjective/Chief Complaint: Complains of incisional pain, difficulty getting up secondary to pain   Objective: Vital signs in last 24 hours: Temp:  [97.8 F (36.6 C)-98.4 F (36.9 C)] 97.9 F (36.6 C) (10/14 0503) Pulse Rate:  [76-99] 76 (10/14 0503) Resp:  [12-23] 16 (10/14 0503) BP: (98-148)/(59-85) 104/67 (10/14 0503) SpO2:  [96 %-100 %] 97 % (10/14 0503)    Intake/Output from previous day: 10/13 0701 - 10/14 0700 In: 3356.1 [P.O.:420; I.V.:2781.1; IV Piggyback:155] Out: 2510 [Urine:2500; Blood:10] Intake/Output this shift: No intake/output data recorded.  Exam: Awake and alert Abdomen soft, non-distended, appropriately tender  Lab Results:  Recent Labs    01/13/22 1625  WBC 12.8*  HGB 14.1  HCT 43.0  PLT 158   BMET Recent Labs    01/13/22 1625  CREATININE 1.07   PT/INR No results for input(s): "LABPROT", "INR" in the last 72 hours. ABG No results for input(s): "PHART", "HCO3" in the last 72 hours.  Invalid input(s): "PCO2", "PO2"  Studies/Results: No results found.  Anti-infectives: Anti-infectives (From admission, onward)    Start     Dose/Rate Route Frequency Ordered Stop   01/13/22 0800  ceFAZolin (ANCEF) IVPB 2g/100 mL premix        2 g 200 mL/hr over 30 Minutes Intravenous On call to O.R. 01/13/22 0758 01/13/22 1200       Assessment/Plan: s/p Procedure(s): ROBOTIC RECURRENT VENTRAL HERNIA REPAIR WITH MESH (N/A)  Given pain level, will need to stay today and hopefully go home tomorrow   LOS: 0 days    Coralie Keens MD 01/14/2022

## 2022-01-15 ENCOUNTER — Encounter (HOSPITAL_COMMUNITY): Payer: Self-pay | Admitting: Surgery

## 2022-01-15 NOTE — Progress Notes (Signed)
Patient ID: [name] [name], male   DOB: 1981/02/05, 41 y.o.   MRN: [id number]   Doing well Pain control better Abdomen soft  Plan: Discharge home

## 2022-01-15 NOTE — Progress Notes (Signed)
  Transition of Care [facility]) Screening Note   Patient Details  Name: [name] [name] Date of Birth: 06-03-1980   Transition of Care [facility]) CM/SW Contact:    Vassie Moselle, LCSW Phone Number: 01/15/2022, 9:33 AM    Transition of Care Department [facility]) has reviewed patient and no TOC needs have been identified at this time. We will continue to monitor patient advancement through interdisciplinary progression rounds. If new patient transition needs arise, please place a TOC consult.

## 2022-01-15 NOTE — Discharge Summary (Signed)
Physician Discharge Summary  Patient ID: TYDUS SANMIGUEL MRN: [id number] DOB/AGE: February 08, 1981 41 y.o.  Admit date: 01/13/2022 Discharge date: 01/15/2022  Admission Diagnoses:  Discharge Diagnoses:  Principal Problem:   Recurrent incisional hernia   Discharged Condition: good  Hospital Course: Uneventful postoperative course.  Patient discharged home postoperative day 2 with good pain control  Consults: None  Significant Diagnostic Studies:   Treatments: surgery: Laparoscopic ventral incisional hernia repair with mesh  Discharge Exam: Blood pressure 104/65, pulse 88, temperature 98.2 F (36.8 C), temperature source Oral, resp. rate 16, height 6' (1.829 m), weight 102.2 kg, SpO2 96 %. General appearance: alert, cooperative, and no distress Resp: clear to auscultation bilaterally Cardio: regular rate and rhythm, S1, S2 normal, no murmur, click, rub or gallop Incision/Wound:abdomen soft, incisions clean  Disposition: Discharge disposition: 01-Home or Self Care       Discharge Instructions     Diet - low sodium heart healthy   Complete by: As directed    Increase activity slowly   Complete by: As directed       Allergies as of 01/15/2022   No Known Allergies      Medication List     TAKE these medications    Apremilast 30 MG Tabs Take 30 mg by mouth in the morning and at bedtime.   atorvastatin 80 MG tablet Commonly known as: LIPITOR TAKE 1 TABLET(80 MG) BY MOUTH DAILY   clonazePAM 0.5 MG tablet Commonly known as: KLONOPIN Take 0.5 mg by mouth 3 (three) times daily.   FLUoxetine 40 MG capsule Commonly known as: PROZAC Take 40 mg by mouth daily.   lamoTRIgine 200 MG Tbdp Take 1 tablet (200 mg total) by mouth in the morning and at bedtime.   levETIRAcetam 750 MG tablet Commonly known as: KEPPRA TAKE 1 TABLET(750 MG) BY MOUTH TWICE DAILY   melatonin 5 MG Tabs Take 5 mg by mouth at bedtime.   metoprolol tartrate 25 MG tablet Commonly known as:  LOPRESSOR TAKE 1/2 TABLET BY MOUTH TWICE DAILY   mycophenolate 500 MG tablet Commonly known as: CELLCEPT Take 1,000 mg by mouth 2 (two) times daily.   oxyCODONE-acetaminophen 5-325 MG tablet Commonly known as: Percocet Take 1 tablet by mouth every 4 (four) hours as needed for severe pain.   QUEtiapine 200 MG tablet Commonly known as: SEROQUEL Take 200 mg by mouth at bedtime.        Follow-up Information     Stechschulte, Nickola Major, MD Follow up in 4 week(s).   Specialty: Surgery Contact information: [address] [place] [address] [phone]                 Signed: Coralie Keens 01/15/2022, 8:39 AM

## 2022-01-15 NOTE — Progress Notes (Signed)
Reviewed written d/c instructions w pt and all questions answered. He verbalized understanding. D/C ambulatory w all belongings in stable condition 

## 2022-01-21 ENCOUNTER — Encounter (HOSPITAL_COMMUNITY): Payer: Self-pay | Admitting: Emergency Medicine

## 2022-01-21 ENCOUNTER — Emergency Department (HOSPITAL_COMMUNITY)
Admission: EM | Admit: 2022-01-21 | Discharge: 2022-01-22 | Disposition: A | Discharge status: Home / Self Care | Payer: Medicare Other | Attending: Emergency Medicine | Admitting: Emergency Medicine

## 2022-01-21 DIAGNOSIS — R109 Unspecified abdominal pain: Secondary | ICD-10-CM | POA: Insufficient documentation

## 2022-01-21 DIAGNOSIS — G8918 Other acute postprocedural pain: Secondary | ICD-10-CM | POA: Insufficient documentation

## 2022-01-21 NOTE — ED Provider Triage Note (Signed)
Emergency Medicine Provider Triage Evaluation Note  [name] [name] , a 41 y.o. male  was evaluated in triage.  Pt complains of abdominal pain starting yesterday. Had hernia repair on 10/13, was feeling well until he started having severe pain and bulging of his abdomen starting yesterday. Hurts worse with sitting up straight. Feels the bulging is hitting up against his sternum. Decreased appetite without N/V  Review of Systems  Positive: Abd pain Negative: N/V/D  Physical Exam  BP 125/84   Pulse 87   Temp 98.4 F (36.9 C) (Oral)   Resp 17   SpO2 99%  Gen:   Awake, no distress   Resp:  Normal effort  MSK:   Moves extremities without difficulty  Other:  Healing laparoscopic surgical sites, bulging of upper abdomen  Medical Decision Making  Medically screening exam initiated at 9:27 PM.  Appropriate orders placed.  [name] [name] was informed that the remainder of the evaluation will be completed by another provider, this initial triage assessment does not replace that evaluation, and the importance of remaining in the ED until their evaluation is complete.  Workup initiated including imaging   Kateri Plummer, PA-C 01/21/22 2128

## 2022-01-21 NOTE — ED Triage Notes (Signed)
Pt reports that he had laproscopic hernia surgery on 10/13. States that last night, his abdomen started swelling and pain has increased. Denies vomiting, but states that he does not have an appetite. Reports normal urination and bms. No obvious signs of infection at incisions.

## 2022-01-22 ENCOUNTER — Encounter (HOSPITAL_COMMUNITY): Payer: Self-pay | Admitting: Radiology

## 2022-01-22 ENCOUNTER — Emergency Department (HOSPITAL_COMMUNITY): Payer: Medicare Other

## 2022-01-22 ENCOUNTER — Other Ambulatory Visit: Payer: Self-pay

## 2022-01-22 DIAGNOSIS — R109 Unspecified abdominal pain: Secondary | ICD-10-CM | POA: Diagnosis not present

## 2022-01-22 LAB — CBC
HCT: 46 % (ref 39.0–52.0)
Hemoglobin: 15.1 g/dL (ref 13.0–17.0)
MCH: 31 pg (ref 26.0–34.0)
MCHC: 32.8 g/dL (ref 30.0–36.0)
MCV: 94.5 fL (ref 80.0–100.0)
Platelets: 241 10*3/uL (ref 150–400)
RBC: 4.87 MIL/uL (ref 4.22–5.81)
RDW: 12.1 % (ref 11.5–15.5)
WBC: 9.9 10*3/uL (ref 4.0–10.5)
nRBC: 0 % (ref 0.0–0.2)

## 2022-01-22 LAB — URINALYSIS, ROUTINE W REFLEX MICROSCOPIC
Bilirubin Urine: NEGATIVE
Glucose, UA: NEGATIVE mg/dL
Hgb urine dipstick: NEGATIVE
Ketones, ur: NEGATIVE mg/dL
Leukocytes,Ua: NEGATIVE
Nitrite: NEGATIVE
Protein, ur: NEGATIVE mg/dL
Specific Gravity, Urine: <1.005 — ABNORMAL LOW (ref 1.005–1.030)
pH: 5.5 (ref 5.0–8.0)

## 2022-01-22 LAB — COMPREHENSIVE METABOLIC PANEL
ALT: 31 U/L (ref 0–44)
AST: 16 U/L (ref 15–41)
Albumin: 4.6 g/dL (ref 3.5–5.0)
Alkaline Phosphatase: 72 U/L (ref 38–126)
Anion gap: 11 (ref 5–15)
BUN: 21 mg/dL — ABNORMAL HIGH (ref 6–20)
CO2: 23 mmol/L (ref 22–32)
Calcium: 9.4 mg/dL (ref 8.9–10.3)
Chloride: 106 mmol/L (ref 98–111)
Creatinine, Ser: 1.02 mg/dL (ref 0.61–1.24)
GFR, Estimated: >60 mL/min (ref >60)
Glucose, Bld: 100 mg/dL — ABNORMAL HIGH (ref 70–99)
Potassium: 3.3 mmol/L — ABNORMAL LOW (ref 3.5–5.1)
Sodium: 140 mmol/L (ref 135–145)
Total Bilirubin: 0.8 mg/dL (ref 0.3–1.2)
Total Protein: 7.8 g/dL (ref 6.5–8.1)

## 2022-01-22 LAB — LIPASE, BLOOD: Lipase: 32 U/L (ref 11–51)

## 2022-01-22 MED ORDER — OXYCODONE-ACETAMINOPHEN 5-325 MG PO TABS: 1.0000 | ORAL_TABLET | Freq: Four times a day (QID) | ORAL | 0 refills | Status: DC | PRN

## 2022-01-22 MED ORDER — HYDROMORPHONE HCL 1 MG/ML IJ SOLN
1.0000 mg | Freq: Once | INTRAMUSCULAR | Status: AC
  Administered 2022-01-22: 1 mg via INTRAVENOUS
  Filled 2022-01-22: qty 1

## 2022-01-22 MED ORDER — SODIUM CHLORIDE (PF) 0.9 % IJ SOLN
INTRAMUSCULAR | Status: AC
  Filled 2022-01-22: qty 50

## 2022-01-22 MED ORDER — IOHEXOL 300 MG/ML  SOLN
100.0000 mL | Freq: Once | INTRAMUSCULAR | Status: AC | PRN
  Administered 2022-01-22: 100 mL via INTRAVENOUS

## 2022-01-22 NOTE — ED Notes (Signed)
Labeled urine specimen and culture sent to lab. ENMiles 

## 2022-01-22 NOTE — Discharge Instructions (Addendum)
The pain is thought to be coming from a post-operative seroma (fluid collection).  You CT didn't show any recurrent hernia.  It is recommend to use warm compresses, heating pads, and continue taking pain medication as prescribed.  Please call [facility] to be seen in clinic on Monday per Dr. Rosendo Gros.

## 2022-01-22 NOTE — ED Provider Notes (Signed)
[facility] Emergency Department Provider Note MRN:  [id number]  Arrival date & time: 01/22/22     Chief Complaint   Abdominal Pain   History of Present Illness   [name] [name] is a 41 y.o. year-old male presents to the ED with chief complaint of severe abdominal pain.  Is s/p 10 days ventral hernia repair.  States that yesterday began having severe pain and bulging at the site of the ventral hernia.  He states that the pain worsens with palpation and flexion.  He states that he feels like the hernia is bulging up and impacting his diaphragm that causes increased pain.  History provided by patient.   Review of Systems  Pertinent positive and negative review of systems noted in HPI.    Physical Exam   Vitals:   01/22/22 0300 01/22/22 0315  BP: 119/82   Pulse: 70 70  Resp:    Temp:    SpO2: 100% 100%    CONSTITUTIONAL:  unco-appearing, NAD NEURO:  Alert and oriented x 3, CN 3-12 grossly intact EYES:  eyes equal and reactive ENT/NECK:  Supple, no stridor  CARDIO:  normal rate, regular rhythm, appears well-perfused  PULM:  No respiratory distress,  GI/GU:  non-distended, ventral oblong mass ~10cm x 4cm, visible with abdominal flexion, no warmth, erythema, or discharge, surgical wounds are CDI MSK/SPINE:  No gross deformities, no edema, moves all extremities  SKIN:  no rash, atraumatic   *Additional and/or pertinent findings included in MDM below  Diagnostic and Interventional Summary     Labs Reviewed  COMPREHENSIVE METABOLIC PANEL - Abnormal; Notable for the following components:      Result Value   Potassium 3.3 (*)    Glucose, Bld 100 (*)    BUN 21 (*)    All other components within normal limits  URINALYSIS, ROUTINE W REFLEX MICROSCOPIC - Abnormal; Notable for the following components:   Specific Gravity, Urine <1.005 (*)    All other components within normal limits  LIPASE, BLOOD  CBC    CT ABDOMEN PELVIS W CONTRAST  Final Result       Medications  sodium chloride (PF) 0.9 % injection (  Not Given 01/22/22 0227)  iohexol (OMNIPAQUE) 300 MG/ML solution 100 mL (100 mLs Intravenous Contrast Given 01/22/22 0104)  HYDROmorphone (DILAUDID) injection 1 mg (1 mg Intravenous Given 01/22/22 1093)     Procedures  /  Critical Care Procedures  ED Course and Medical Decision Making  I have reviewed the triage vital signs, the nursing notes, and pertinent available records from the EMR.  Social Determinants Affecting Complexity of Care: Patient has no clinically significant social determinants affecting this chief complaint..   ED Course:    Medical Decision Making Patient here with abdominal pain.  Status post 10 days ventral hernia repair.  He has a large mass that appears with abdominal flexion that is concerning for recurrent hernia.  CT was ordered in triage, and shows that the abdominal wall is intact, and no evidence of hernia seen on CT.  Patient's pain was treated with IV Dilaudid.  I called and discussed case with Dr. Rosendo Gros as discussed below.  Amount and/or Complexity of Data Reviewed Labs:     Details: No leukocytosis Radiology: independent interpretation performed.    Details: No large volume intraabdominal free air  Risk Prescription drug management.     Consultants: I discussed the case with Dr. Rosendo Gros, who states that in the setting of reassuring CT, this is  likely a post-operative seroma and can be managed with warm compresses and heating pads.  Recommends that patient call the office Monday morning for same day appointment in clinic.  I discussed this plan with the patient, who agrees with the plan.  I'll give him an RX for a few more percocets so he doesn't run out over the weekend.   Treatment and Plan: I considered admission due to patient's initial presentation, but after considering the examination and diagnostic results, patient will not require admission and can be discharged with  outpatient follow-up.    Final Clinical Impressions(s) / ED Diagnoses     ICD-10-CM   1. Post-operative pain  G89.18       ED Discharge Orders          Ordered    oxyCODONE-acetaminophen (PERCOCET/ROXICET) 5-325 MG tablet  Every 6 hours PRN        01/22/22 0328              Discharge Instructions Discussed with and Provided to Patient:     Discharge Instructions      The pain is thought to be coming from a post-operative seroma (fluid collection).  You CT didn't show any recurrent hernia.  It is recommend to use warm compresses, heating pads, and continue taking pain medication as prescribed.  Please call [facility] to be seen in clinic on Monday per Dr. Rosendo Gros.       Montine Circle, PA-C 60/04/59 9774    Delora Fuel, MD 14/23/95 [phone]

## 2022-01-23 ENCOUNTER — Ambulatory Visit (INDEPENDENT_AMBULATORY_CARE_PROVIDER_SITE_OTHER): Payer: Medicare Other

## 2022-01-23 DIAGNOSIS — R55 Syncope and collapse: Secondary | ICD-10-CM | POA: Diagnosis not present

## 2022-01-24 LAB — CUP PACEART REMOTE DEVICE CHECK
Date Time Interrogation Session: 20231022231317
Implantable Pulse Generator Implant Date: 20210512

## 2022-02-13 ENCOUNTER — Encounter: Payer: Self-pay | Admitting: Neurology

## 2022-02-13 ENCOUNTER — Ambulatory Visit (INDEPENDENT_AMBULATORY_CARE_PROVIDER_SITE_OTHER): Payer: Medicare Other | Admitting: Neurology

## 2022-02-13 VITALS — BP 114/80 | HR 83 | Ht 69.0 in | Wt 222.0 lb

## 2022-02-13 DIAGNOSIS — M352 Behcet's disease: Secondary | ICD-10-CM

## 2022-02-13 DIAGNOSIS — G40909 Epilepsy, unspecified, not intractable, without status epilepticus: Secondary | ICD-10-CM | POA: Diagnosis not present

## 2022-02-13 NOTE — Progress Notes (Signed)
Reason for visit: Behcet's disease, seizures  [name] [name] is an 41 y.o. male  Interval history 02/13/2022:  [name] [name] presents today for follow-up.  Last visit was in July and since then he has not had any additional seizures.  His eyesight is completely back to normal (he was admitted for optic neuritis).  No other complaints.  Doing well overall.  Interval history 10/18/2021:  Patient presents today for follow-up, last visit was in November, at that time he was continued on Keppra and lamotrigine.  He denies any additional seizures but stated 2 weeks ago he was admitted to the hospital for a bout of optic neuritis.  He completed steroid course and his vision is back to his normal self.  Denies any seizure while he was in the hospital. This is the first time he experienced optic neuritis.    Interval History 02/10/21:  This is a 41 year old gentleman previously followed by Dr. Jannifer Franklin for seizure and Behcet's disease, doing well in term of his seizures, last last visit was on September 1, at that time he reported being seizure-free for more than 4 years.  He is currently on Keppra 750 mg twice daily and lamotrigine 200 mg twice daily.  He is presenting today needing a clearance for DMV in order to drive again, no other complaints.    History of present illness: [name] [name] is a 41 year old right-handed white male with a history of Behcet's disease associated with seizures.  The patient has done well with seizure control on a combination of Keppra and Lamictal.  He denies any side effects on the medication.  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  He was on Provigil for severe fatigue after COVID infection but this has improved and he has stopped the medication.  He returns the office today for further evaluation.  Past Medical History:  Diagnosis Date   Anxiety    Behcet's disease ([place])    Bipolar disorder ([place])    Cardiac arrest ([place]) 12/25/2017   one round of CPR    Depression     Hypertension    Migraine    Seizures ([place])    last one 2017   Sleep apnea    CPAP at night   Vitamin D deficiency     Past Surgical History:  Procedure Laterality Date   APPENDECTOMY     CYSTOSCOPY WITH URETHRAL DILATATION N/A 10/15/2019   Procedure: CYSTOSCOPY WITH URETHRAL DILATATION WITH BALLOON;  Surgeon: Lucas Mallow, MD;  Location: [facility];  Service: Urology;  Laterality: N/A;   FETAL SURGERY FOR CONGENITAL HERNIA     x 2   FRACTURE SURGERY Right    Arm   LEFT HEART CATH AND CORONARY ANGIOGRAPHY N/A 11/29/2018   Procedure: LEFT HEART CATH AND CORONARY ANGIOGRAPHY;  Surgeon: Martinique, Peter M, MD;  Location: [facility];  Service: Cardiovascular;  Laterality: N/A;   maxilofacial     NISSEN FUNDOPLICATION     RADIOLOGY WITH ANESTHESIA N/A 02/13/2019   Procedure: MRI WITH ANESTHESIA OF BRAIN WITH AND WITHOUT CONTRAST;  Surgeon: Radiologist, Medication, MD;  Location: [facility];  Service: Radiology;  Laterality: N/A;   XI ROBOTIC ASSISTED VENTRAL HERNIA N/A 01/13/2022   Procedure: ROBOTIC RECURRENT VENTRAL HERNIA REPAIR WITH MESH;  Surgeon: Stechschulte, Nickola Major, MD;  Location: [facility];  Service: General;  Laterality: N/A;    Family History  Problem Relation Age of Onset   Lung cancer Paternal Grandfather  Other Father        BPPV    Social history:  reports that he has never smoked. He has never used smokeless tobacco. He reports that he does not drink alcohol and does not use drugs.   No Known Allergies  Medications:  Current Meds  Medication Sig   Apremilast 30 MG TABS Take 30 mg by mouth in the morning and at bedtime.   atorvastatin (LIPITOR) 80 MG tablet TAKE 1 TABLET(80 MG) BY MOUTH DAILY (Patient taking differently: Take 80 mg by mouth daily.)   clonazePAM (KLONOPIN) 0.25 MG disintegrating tablet Take 0.25 mg by mouth 3 (three) times daily.   FLUoxetine (PROZAC) 40 MG capsule Take 40 mg by mouth daily.   lamoTRIgine 200 MG TBDP Take 1 tablet (200 mg total)  by mouth in the morning and at bedtime.   levETIRAcetam (KEPPRA) 750 MG tablet TAKE 1 TABLET(750 MG) BY MOUTH TWICE DAILY (Patient taking differently: Take 750 mg by mouth 2 (two) times daily.)   melatonin 5 MG TABS Take 5 mg by mouth at bedtime.   metoprolol tartrate (LOPRESSOR) 25 MG tablet TAKE 1/2 TABLET BY MOUTH TWICE DAILY (Patient taking differently: Take 12.5 mg by mouth 2 (two) times daily.)   mycophenolate (CELLCEPT) 500 MG tablet Take 1,000 mg by mouth 2 (two) times daily.   QUEtiapine (SEROQUEL) 200 MG tablet Take 200 mg by mouth at bedtime.   Current Facility-Administered Medications for the 02/13/22 encounter (Office Visit) with Alric Ran, MD  Medication   lidocaine-EPINEPHrine (XYLOCAINE W/EPI) 1 %-1:100000 (with pres) injection 15 mL      ROS:  Out of a complete 14 system review of symptoms, the patient complains only of the following symptoms, and all other reviewed systems are negative.  Skin sores History of seizures  Blood pressure 114/80, pulse 83, height '5\' 9"'$  (1.753 m), weight 222 lb (100.7 kg).  Physical Exam  General: The patient is alert and cooperative at the time of the examination.  The patient is moderately obese.  Skin: No significant peripheral edema is noted.   Neurologic Exam  Mental status: The patient is alert and oriented x 3 at the time of the examination. The patient has apparent normal recent and remote memory, with an apparently normal attention span and concentration ability.   Cranial nerves: Facial symmetry is present. Speech is normal, no aphasia or dysarthria is noted. Extraocular movements are full. Visual fields are full.  Motor: The patient has good strength in all 4 extremities.  Sensory examination: Soft touch sensation is symmetric on the face, arms, and legs.  Coordination: The patient has good finger-nose-finger and heel-to-shin bilaterally.  Gait and station: The patient has a normal gait. Tandem gait is normal.  Romberg is negative. No drift is seen.  Reflexes: Deep tendon reflexes are symmetric.   Lamotrigine level 5.9 Keppra level 24.1   Assessment/Plan:  1.  Behcet's disease  2.  History of seizures  3. Optic neuritis, resolved  The patient is doing well with seizure control currently.  He will continue on the Lamictal and Keppra. Follow up in 1 year or sooner if worse.    Alric Ran, MD 02/13/2022 9:20 AM  [facility] [address] [place], [place] [address]  Phone [phone] Fax [phone]

## 2022-02-14 NOTE — Progress Notes (Signed)
Carelink Summary Report / Loop Recorder 

## 2022-02-27 ENCOUNTER — Ambulatory Visit (INDEPENDENT_AMBULATORY_CARE_PROVIDER_SITE_OTHER): Payer: Medicare Other

## 2022-02-27 DIAGNOSIS — R55 Syncope and collapse: Secondary | ICD-10-CM

## 2022-02-28 LAB — CUP PACEART REMOTE DEVICE CHECK
Date Time Interrogation Session: 20231126231939
Implantable Pulse Generator Implant Date: 20210512

## 2022-04-04 ENCOUNTER — Ambulatory Visit (INDEPENDENT_AMBULATORY_CARE_PROVIDER_SITE_OTHER): Payer: Medicare Other

## 2022-04-04 DIAGNOSIS — R55 Syncope and collapse: Secondary | ICD-10-CM | POA: Diagnosis not present

## 2022-04-04 LAB — CUP PACEART REMOTE DEVICE CHECK
Date Time Interrogation Session: 20240101231318
Implantable Pulse Generator Implant Date: 20210512

## 2022-04-10 NOTE — Progress Notes (Signed)
Carelink Summary Report / Loop Recorder 

## 2022-04-19 ENCOUNTER — Telehealth: Payer: Self-pay | Admitting: *Deleted

## 2022-04-19 DIAGNOSIS — Z0289 Encounter for other administrative examinations: Secondary | ICD-10-CM

## 2022-04-19 NOTE — Telephone Encounter (Signed)
I faxed pt dmv form on 04/19/2022

## 2022-05-01 ENCOUNTER — Other Ambulatory Visit: Payer: Self-pay | Admitting: Neurology

## 2022-05-01 DIAGNOSIS — R569 Unspecified convulsions: Secondary | ICD-10-CM

## 2022-05-01 NOTE — Telephone Encounter (Signed)
Rx refilled.

## 2022-05-04 NOTE — Progress Notes (Signed)
[facility] Summary Report / Loop Recorder

## 2022-05-07 LAB — CUP PACEART REMOTE DEVICE CHECK
Date Time Interrogation Session: 20240203231638
Implantable Pulse Generator Implant Date: 20210512

## 2022-05-08 ENCOUNTER — Ambulatory Visit: Payer: Medicare Other

## 2022-05-08 DIAGNOSIS — R55 Syncope and collapse: Secondary | ICD-10-CM

## 2022-05-29 ENCOUNTER — Other Ambulatory Visit (HOSPITAL_COMMUNITY): Payer: Self-pay

## 2022-06-01 ENCOUNTER — Other Ambulatory Visit: Payer: Self-pay | Admitting: Neurology

## 2022-06-12 ENCOUNTER — Ambulatory Visit: Payer: Medicare Other

## 2022-06-12 DIAGNOSIS — R55 Syncope and collapse: Secondary | ICD-10-CM

## 2022-06-13 LAB — CUP PACEART REMOTE DEVICE CHECK
Date Time Interrogation Session: 20240310231719
Implantable Pulse Generator Implant Date: 20210512

## 2022-06-21 NOTE — Progress Notes (Signed)
Carelink Summary Report / Loop Recorder 

## 2022-07-17 ENCOUNTER — Ambulatory Visit (INDEPENDENT_AMBULATORY_CARE_PROVIDER_SITE_OTHER): Payer: Medicare Other

## 2022-07-17 DIAGNOSIS — R55 Syncope and collapse: Secondary | ICD-10-CM | POA: Diagnosis not present

## 2022-07-17 LAB — CUP PACEART REMOTE DEVICE CHECK
Date Time Interrogation Session: 20240414231310
Implantable Pulse Generator Implant Date: 20210512

## 2022-07-19 NOTE — Progress Notes (Signed)
Carelink Summary Report / Loop Recorder 

## 2022-08-21 ENCOUNTER — Ambulatory Visit (INDEPENDENT_AMBULATORY_CARE_PROVIDER_SITE_OTHER): Payer: Medicare Other

## 2022-08-21 DIAGNOSIS — R55 Syncope and collapse: Secondary | ICD-10-CM | POA: Diagnosis not present

## 2022-08-21 LAB — CUP PACEART REMOTE DEVICE CHECK
Date Time Interrogation Session: 20240517230338
Implantable Pulse Generator Implant Date: 20210512

## 2022-08-21 NOTE — Progress Notes (Signed)
Carelink Summary Report / Loop Recorder 

## 2022-09-15 NOTE — Progress Notes (Signed)
Carelink Summary Report / Loop Recorder 

## 2022-09-25 ENCOUNTER — Ambulatory Visit (INDEPENDENT_AMBULATORY_CARE_PROVIDER_SITE_OTHER): Payer: Medicare Other

## 2022-09-25 DIAGNOSIS — R55 Syncope and collapse: Secondary | ICD-10-CM | POA: Diagnosis not present

## 2022-09-25 LAB — CUP PACEART REMOTE DEVICE CHECK
Date Time Interrogation Session: 20240623231254
Implantable Pulse Generator Implant Date: 20210512

## 2022-10-02 ENCOUNTER — Emergency Department (HOSPITAL_BASED_OUTPATIENT_CLINIC_OR_DEPARTMENT_OTHER): Payer: Medicare Other | Admitting: Radiology

## 2022-10-02 ENCOUNTER — Other Ambulatory Visit: Payer: Self-pay

## 2022-10-02 ENCOUNTER — Encounter (HOSPITAL_BASED_OUTPATIENT_CLINIC_OR_DEPARTMENT_OTHER): Payer: Self-pay

## 2022-10-02 DIAGNOSIS — M79604 Pain in right leg: Secondary | ICD-10-CM | POA: Diagnosis present

## 2022-10-02 DIAGNOSIS — Z79899 Other long term (current) drug therapy: Secondary | ICD-10-CM | POA: Diagnosis not present

## 2022-10-02 DIAGNOSIS — I10 Essential (primary) hypertension: Secondary | ICD-10-CM | POA: Insufficient documentation

## 2022-10-02 DIAGNOSIS — R112 Nausea with vomiting, unspecified: Secondary | ICD-10-CM | POA: Insufficient documentation

## 2022-10-02 LAB — CBC
HCT: 42 % (ref 39.0–52.0)
Hemoglobin: 14.4 g/dL (ref 13.0–17.0)
MCH: 31.4 pg (ref 26.0–34.0)
MCHC: 34.3 g/dL (ref 30.0–36.0)
MCV: 91.5 fL (ref 80.0–100.0)
Platelets: 174 10*3/uL (ref 150–400)
RBC: 4.59 MIL/uL (ref 4.22–5.81)
RDW: 12.2 % (ref 11.5–15.5)
WBC: 6.8 10*3/uL (ref 4.0–10.5)
nRBC: 0 % (ref 0.0–0.2)

## 2022-10-02 LAB — COMPREHENSIVE METABOLIC PANEL WITH GFR
AST: 16 U/L (ref 15–41)
Albumin: 4.5 g/dL (ref 3.5–5.0)
Alkaline Phosphatase: 75 U/L (ref 38–126)
BUN: 16 mg/dL (ref 6–20)
CO2: 20 mmol/L — ABNORMAL LOW (ref 22–32)
Potassium: 3.6 mmol/L (ref 3.5–5.1)
Total Protein: 7.2 g/dL (ref 6.5–8.1)

## 2022-10-02 LAB — COMPREHENSIVE METABOLIC PANEL
ALT: 24 U/L (ref 0–44)
Anion gap: 12 (ref 5–15)
Calcium: 9.4 mg/dL (ref 8.9–10.3)
Chloride: 105 mmol/L (ref 98–111)
Creatinine, Ser: 1.04 mg/dL (ref 0.61–1.24)
GFR, Estimated: >60 mL/min (ref >60)
Glucose, Bld: 120 mg/dL — ABNORMAL HIGH (ref 70–99)
Sodium: 137 mmol/L (ref 135–145)
Total Bilirubin: 0.3 mg/dL (ref 0.3–1.2)

## 2022-10-02 LAB — LIPASE, BLOOD: Lipase: 29 U/L (ref 11–51)

## 2022-10-02 NOTE — ED Triage Notes (Signed)
Patient here POV from Home.  Endorses right Leg Pain for 1 Month. Began to have N/V today and noted a Bloody Clot in Vomit. No fevers. No Diarrhea. Some SOB as well.   NAD Noted during triage. A&Ox4. GCS 15. Ambulatory.

## 2022-10-03 ENCOUNTER — Emergency Department (HOSPITAL_BASED_OUTPATIENT_CLINIC_OR_DEPARTMENT_OTHER)
Admission: EM | Admit: 2022-10-03 | Discharge: 2022-10-03 | Disposition: A | Discharge status: Home / Self Care | Payer: Medicare Other | Attending: Emergency Medicine | Admitting: Emergency Medicine

## 2022-10-03 DIAGNOSIS — R112 Nausea with vomiting, unspecified: Secondary | ICD-10-CM

## 2022-10-03 DIAGNOSIS — M79661 Pain in right lower leg: Secondary | ICD-10-CM

## 2022-10-03 LAB — D-DIMER, QUANTITATIVE: D-Dimer, Quant: <0.27 ug/mL-FEU (ref 0.00–0.50)

## 2022-10-03 NOTE — ED Provider Notes (Signed)
[facility] AT [facility]  Provider Note  CSN: [id number] Arrival date & time: 10/02/22 2142  History Chief Complaint  Patient presents with   Leg Pain    [name] [name] is a 42 y.o. male with history of bipolar, seizures, HTN, anxiety presents for evaluation of R calf pain. He tells me it has only been hurting for a day, triage documented a month. He also reports vaguely not feeling well for a while, but not able to further quantify. He reports he had one episode of vomiting earlier that may have had a small blood clot. He denies any abdominal pain. No melena. No further vomiting spells.    Home Medications Prior to Admission medications   Medication Sig Start Date End Date Taking? Authorizing Provider  Apremilast 30 MG TABS Take 30 mg by mouth in the morning and at bedtime.    [provider]  atorvastatin (LIPITOR) 80 MG tablet TAKE 1 TABLET(80 MG) BY MOUTH DAILY Patient taking differently: Take 80 mg by mouth daily. 01/04/22   Croitoru, Mihai, MD  clonazePAM (KLONOPIN) 0.25 MG disintegrating tablet Take 0.25 mg by mouth 3 (three) times daily. 01/05/22   [provider]  FLUoxetine (PROZAC) 40 MG capsule Take 40 mg by mouth daily.    [provider]  lamoTRIgine 200 MG TBDP DISSOLVE 1 TABLET BY MOUTH IN THE MORNING AND AT BEDTIME 06/02/22   Windell Norfolk, MD  levETIRAcetam (KEPPRA) 750 MG tablet TAKE 1 TABLET(750 MG) BY MOUTH TWICE DAILY 05/01/22   Windell Norfolk, MD  melatonin 5 MG TABS Take 5 mg by mouth at bedtime.    [provider]  metoprolol tartrate (LOPRESSOR) 25 MG tablet TAKE 1/2 TABLET BY MOUTH TWICE DAILY Patient taking differently: Take 12.5 mg by mouth 2 (two) times daily. 02/08/21   Croitoru, Mihai, MD  mycophenolate (CELLCEPT) 500 MG tablet Take 1,000 mg by mouth 2 (two) times daily. 03/20/17   [provider]  QUEtiapine (SEROQUEL) 200 MG tablet Take 200 mg by mouth at bedtime.    [provider]     Allergies    Patient has no known allergies.   Review of Systems   Review of Systems Please see HPI for pertinent positives and negatives  Physical Exam BP 108/78   Pulse 76   Temp 97.8 F (36.6 C) (Oral)   Resp 18   Ht 6' (1.829 m)   Wt 108.9 kg   SpO2 96%   BMI 32.55 kg/m   Physical Exam Vitals and nursing note reviewed.  Constitutional:      Appearance: Normal appearance.  HENT:     Head: Normocephalic and atraumatic.     Nose: Nose normal.     Mouth/Throat:     Mouth: Mucous membranes are moist.  Eyes:     Extraocular Movements: Extraocular movements intact.     Conjunctiva/sclera: Conjunctivae normal.  Cardiovascular:     Rate and Rhythm: Normal rate.  Pulmonary:     Effort: Pulmonary effort is normal.     Breath sounds: Normal breath sounds.  Abdominal:     General: Abdomen is flat.     Palpations: Abdomen is soft.     Tenderness: There is no abdominal tenderness. There is no guarding or rebound.  Musculoskeletal:        General: Tenderness (R calf; no tenderness over the proximal deep veins of the legs.) present. No swelling. Normal range of motion.     Cervical back:  Neck supple.  Skin:    General: Skin is warm and dry.  Neurological:     General: No focal deficit present.     Mental Status: He is alert.  Psychiatric:        Mood and Affect: Mood normal.     ED Results / Procedures / Treatments   EKG EKG Interpretation Date/Time:  Monday October 02 2022 22:03:11 EDT Ventricular Rate:  107 PR Interval:  148 QRS Duration:  80 QT Interval:  324 QTC Calculation: 432 R Axis:   47  Text Interpretation: Sinus tachycardia Cannot rule out Inferior infarct (cited on or before 02-Jan-2022) Cannot rule out Anterior infarct , age undetermined ST & T wave abnormality, consider lateral ischemia Abnormal ECG When compared with ECG of 02-Jan-2022 08:35, No significant change was found Confirmed by Susy Frizzle [phone]) on 10/03/2022 3:29:19  AM  Procedures Procedures  Medications Ordered in the ED Medications - No data to display  Initial Impression and Plan  Patient here primarily for his R calf pain. Also having several other vague symptoms including vomiting and ?hematemesis. Labs done in triage show normal CBC, CMP and lipase. I personally viewed the images from radiology studies and agree with radiologist interpretation: CXR is clear. [place] is not available at this time, low suspicion for DVT, will add dimer and if neg do not feel further imaging is warranted.   ED Course   Clinical Course as of 10/03/22 0329  Tue Oct 03, 2022  0327 Dimer is normal. Patient reassured no emergent problems found on his ED visit today. Recommend close outpatient PCP follow up, RTED for any other concerns.   [CS]    Clinical Course User Index [CS] Pollyann Savoy, MD     MDM Rules/Calculators/A&P Medical Decision Making Problems Addressed: Nausea and vomiting, unspecified vomiting type: acute illness or injury Right calf pain: acute illness or injury  Amount and/or Complexity of Data Reviewed Labs: ordered. Decision-making details documented in ED Course. Radiology: ordered and independent interpretation performed. Decision-making details documented in ED Course. ECG/medicine tests: ordered and independent interpretation performed. Decision-making details documented in ED Course.     Final Clinical Impression(s) / ED Diagnoses Final diagnoses:  Right calf pain  Nausea and vomiting, unspecified vomiting type    Rx / DC Orders ED Discharge Orders     None        Pollyann Savoy, MD 10/03/22 [phone]

## 2022-10-03 NOTE — ED Notes (Signed)
Pt. C/o vague r leg pain, and states he coughed up a nodule today.

## 2022-10-10 ENCOUNTER — Ambulatory Visit (INDEPENDENT_AMBULATORY_CARE_PROVIDER_SITE_OTHER): Payer: Medicare Other | Admitting: Neurology

## 2022-10-10 ENCOUNTER — Encounter: Payer: Self-pay | Admitting: Neurology

## 2022-10-10 DIAGNOSIS — R569 Unspecified convulsions: Secondary | ICD-10-CM | POA: Diagnosis not present

## 2022-10-10 MED ORDER — LAMOTRIGINE 200 MG PO TBDP: ORAL_TABLET | ORAL | 3 refills | Status: DC

## 2022-10-10 MED ORDER — LEVETIRACETAM 750 MG PO TABS
ORAL_TABLET | ORAL | 3 refills | Status: DC
Start: 2022-10-10 — End: 2022-10-31

## 2022-10-10 NOTE — Progress Notes (Signed)
Reason for visit: Behcet's disease, seizures  [name] [name] is an 42 y.o. male  Interval history 10/10/2022:  Patient presents today for follow-up, he is alone.  Last visit was in November and since then he has been doing well, denies any seizure or seizure-like activity.  He is compliant with his lamotrigine and Keppra again no side effects.  Currently he is complaining of itching all over.  Mostly in his calves,  He reports that nothing helps he would like to see dermatology.   Interval history 02/13/2022:  [name] [name] presents today for follow-up.  Last visit was in July and since then he has not had any additional seizures.  His eyesight is completely back to normal (he was admitted for optic neuritis).  No other complaints.  Doing well overall.  Interval history 10/18/2021:  Patient presents today for follow-up, last visit was in November, at that time he was continued on Keppra and lamotrigine.  He denies any additional seizures but stated 2 weeks ago he was admitted to the hospital for a bout of optic neuritis.  He completed steroid course and his vision is back to his normal self.  Denies any seizure while he was in the hospital. This is the first time he experienced optic neuritis.    Interval History 02/10/21:  This is a 42 year old gentleman previously followed by Dr. Anne Hahn for seizure and Behcet's disease, doing well in term of his seizures, last last visit was on September 1, at that time he reported being seizure-free for more than 4 years.  He is currently on Keppra 750 mg twice daily and lamotrigine 200 mg twice daily.  He is presenting today needing a clearance for DMV in order to drive again, no other complaints.    History of present illness: [name] [name] is a 42 year old right-handed white male with a history of Behcet's disease associated with seizures.  The patient has done well with seizure control on a combination of Keppra and Lamictal.  He denies any side effects on the  medication.  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  He was on Provigil for severe fatigue after COVID infection but this has improved and he has stopped the medication.  He returns the office today for further evaluation.  Past Medical History:  Diagnosis Date   Anxiety    Behcet's disease (HCC)    Bipolar disorder (HCC)    Cardiac arrest (HCC) 12/25/2017   one round of CPR    Depression    Hypertension    Migraine    Seizures (HCC)    last one 2017   Sleep apnea    CPAP at night   Vitamin D deficiency     Past Surgical History:  Procedure Laterality Date   APPENDECTOMY     CYSTOSCOPY WITH URETHRAL DILATATION N/A 10/15/2019   Procedure: CYSTOSCOPY WITH URETHRAL DILATATION WITH BALLOON;  Surgeon: Crista Elliot, MD;  Location: [facility];  Service: Urology;  Laterality: N/A;   FETAL SURGERY FOR CONGENITAL HERNIA     x 2   FRACTURE SURGERY Right    Arm   LEFT HEART CATH AND CORONARY ANGIOGRAPHY N/A 11/29/2018   Procedure: LEFT HEART CATH AND CORONARY ANGIOGRAPHY;  Surgeon: [place], Peter M, MD;  Location: [facility] INVASIVE CV LAB;  Service: Cardiovascular;  Laterality: N/A;   maxilofacial     NISSEN FUNDOPLICATION     RADIOLOGY WITH ANESTHESIA N/A 02/13/2019   Procedure: MRI WITH ANESTHESIA OF BRAIN WITH  AND WITHOUT CONTRAST;  Surgeon: Radiologist, Medication, MD;  Location: [facility] OR;  Service: Radiology;  Laterality: N/A;   XI ROBOTIC ASSISTED VENTRAL HERNIA N/A 01/13/2022   Procedure: ROBOTIC RECURRENT VENTRAL HERNIA REPAIR WITH MESH;  Surgeon: Stechschulte, Hyman Hopes, MD;  Location: [facility];  Service: General;  Laterality: N/A;    Family History  Problem Relation Age of Onset   Lung cancer Paternal Grandfather    Other Father        BPPV    Social history:  reports that he has never smoked. He has never used smokeless tobacco. He reports that he does not drink alcohol and does not use drugs.   No Known Allergies  Medications:  Current Meds  Medication Sig   Apremilast  30 MG TABS Take 30 mg by mouth in the morning and at bedtime.   atorvastatin (LIPITOR) 80 MG tablet TAKE 1 TABLET(80 MG) BY MOUTH DAILY (Patient taking differently: Take 80 mg by mouth daily.)   clonazePAM (KLONOPIN) 0.25 MG disintegrating tablet Take 0.25 mg by mouth 3 (three) times daily.   FLUoxetine (PROZAC) 40 MG capsule Take 40 mg by mouth daily.   melatonin 5 MG TABS Take 5 mg by mouth at bedtime.   mycophenolate (CELLCEPT) 500 MG tablet Take 1,000 mg by mouth 2 (two) times daily.   QUEtiapine (SEROQUEL) 200 MG tablet Take 200 mg by mouth at bedtime.   [DISCONTINUED] lamoTRIgine 200 MG TBDP DISSOLVE 1 TABLET BY MOUTH IN THE MORNING AND AT BEDTIME   [DISCONTINUED] levETIRAcetam (KEPPRA) 750 MG tablet TAKE 1 TABLET(750 MG) BY MOUTH TWICE DAILY   Current Facility-Administered Medications for the 10/10/22 encounter (Office Visit) with Windell Norfolk, MD  Medication   lidocaine-EPINEPHrine (XYLOCAINE W/EPI) 1 %-1:100000 (with pres) injection 15 mL      ROS:  Out of a complete 14 system review of symptoms, the patient complains only of the following symptoms, and all other reviewed systems are negative.  Skin sores History of seizures  Blood pressure 122/64, pulse 98, height 6' (1.829 m), weight 242 lb (109.8 kg), SpO2 96 %.  Physical Exam  General: The patient is alert and cooperative at the time of the examination.  The patient is moderately obese.  Skin: No significant peripheral edema is noted.   Neurologic Exam  Mental status: The patient is alert and oriented x 3 at the time of the examination. The patient has apparent normal recent and remote memory, with an apparently normal attention span and concentration ability.   Cranial nerves: Facial symmetry is present. Speech is normal, no aphasia or dysarthria is noted. Extraocular movements are full. Visual fields are full.  Motor: The patient has good strength in all 4 extremities.  Sensory examination: Soft touch sensation  is symmetric on the face, arms, and legs.  Coordination: The patient has good finger-nose-finger and heel-to-shin bilaterally.  Gait and station: The patient has a normal gait. Tandem gait is normal. Romberg is negative. No drift is seen.  Reflexes: Deep tendon reflexes are symmetric.   Lamotrigine level 5.9 Keppra level 24.1   Assessment/Plan:  1.  Behcet's disease  2.  History of seizures  3. Optic neuritis, resolved  The patient is doing well with seizure control currently.  He will continue on the Lamictal and Keppra. Continue to follow up with PCP regarding the itching complaints. Follow up in 1 year or sooner if worse.    Windell Norfolk, MD 10/10/2022 1:27 PM  [facility] [address] [place], [place] [address]  Phone [phone] Fax [phone]

## 2022-10-13 NOTE — Progress Notes (Signed)
Carelink Summary Report / Loop Recorder 

## 2022-10-24 ENCOUNTER — Ambulatory Visit: Payer: Medicare Other | Admitting: Neurology

## 2022-10-30 ENCOUNTER — Ambulatory Visit (INDEPENDENT_AMBULATORY_CARE_PROVIDER_SITE_OTHER): Payer: Medicare Other

## 2022-10-30 DIAGNOSIS — R55 Syncope and collapse: Secondary | ICD-10-CM

## 2022-10-31 ENCOUNTER — Other Ambulatory Visit: Payer: Self-pay | Admitting: Neurology

## 2022-10-31 DIAGNOSIS — R569 Unspecified convulsions: Secondary | ICD-10-CM

## 2022-11-15 NOTE — Progress Notes (Signed)
Carelink Summary Report / Loop Recorder 

## 2022-11-29 ENCOUNTER — Ambulatory Visit (INDEPENDENT_AMBULATORY_CARE_PROVIDER_SITE_OTHER): Payer: Medicare Other

## 2022-11-29 DIAGNOSIS — R55 Syncope and collapse: Secondary | ICD-10-CM | POA: Diagnosis not present

## 2022-11-30 ENCOUNTER — Other Ambulatory Visit: Payer: Self-pay | Admitting: Neurology

## 2022-11-30 LAB — CUP PACEART REMOTE DEVICE CHECK
Date Time Interrogation Session: 20240828231316
Implantable Pulse Generator Implant Date: 20210512

## 2022-12-07 NOTE — Progress Notes (Signed)
Carelink Summary Report / Loop Recorder 

## 2022-12-25 ENCOUNTER — Other Ambulatory Visit: Payer: Self-pay | Admitting: Cardiovascular Disease

## 2023-01-01 ENCOUNTER — Ambulatory Visit (INDEPENDENT_AMBULATORY_CARE_PROVIDER_SITE_OTHER): Payer: Medicare Other

## 2023-01-01 DIAGNOSIS — R55 Syncope and collapse: Secondary | ICD-10-CM | POA: Diagnosis not present

## 2023-01-02 LAB — CUP PACEART REMOTE DEVICE CHECK
Date Time Interrogation Session: 20240930231810
Implantable Pulse Generator Implant Date: 20210512

## 2023-01-15 NOTE — Progress Notes (Signed)
Carelink Summary Report / Loop Recorder 

## 2023-01-29 ENCOUNTER — Encounter: Payer: Self-pay | Admitting: Cardiovascular Disease

## 2023-01-29 ENCOUNTER — Ambulatory Visit: Payer: Medicare Other | Attending: Cardiovascular Disease | Admitting: Cardiovascular Disease

## 2023-01-29 VITALS — BP 128/86 | HR 88 | Ht 72.0 in | Wt 254.6 lb

## 2023-01-29 DIAGNOSIS — Z95818 Presence of other cardiac implants and grafts: Secondary | ICD-10-CM | POA: Insufficient documentation

## 2023-01-29 DIAGNOSIS — Z87898 Personal history of other specified conditions: Secondary | ICD-10-CM | POA: Insufficient documentation

## 2023-01-29 DIAGNOSIS — E7800 Pure hypercholesterolemia, unspecified: Secondary | ICD-10-CM | POA: Insufficient documentation

## 2023-01-29 NOTE — Patient Instructions (Signed)

## 2023-01-31 ENCOUNTER — Other Ambulatory Visit: Payer: Self-pay | Admitting: Cardiovascular Disease

## 2023-01-31 ENCOUNTER — Other Ambulatory Visit: Payer: Self-pay

## 2023-01-31 MED ORDER — ATORVASTATIN CALCIUM 80 MG PO TABS: ORAL_TABLET | ORAL | 3 refills | Status: DC

## 2023-02-05 ENCOUNTER — Ambulatory Visit (INDEPENDENT_AMBULATORY_CARE_PROVIDER_SITE_OTHER): Payer: Medicare Other

## 2023-02-05 DIAGNOSIS — R55 Syncope and collapse: Secondary | ICD-10-CM | POA: Diagnosis not present

## 2023-02-05 LAB — CUP PACEART REMOTE DEVICE CHECK
Date Time Interrogation Session: 20241102230020
Implantable Pulse Generator Implant Date: 20210512

## 2023-02-26 NOTE — Progress Notes (Signed)
Carelink Summary Report / Loop Recorder 

## 2023-03-03 ENCOUNTER — Emergency Department (HOSPITAL_BASED_OUTPATIENT_CLINIC_OR_DEPARTMENT_OTHER): Payer: Medicare Other

## 2023-03-03 ENCOUNTER — Emergency Department (HOSPITAL_BASED_OUTPATIENT_CLINIC_OR_DEPARTMENT_OTHER)
Admission: EM | Admit: 2023-03-03 | Discharge: 2023-03-04 | Disposition: A | Discharge status: Home / Self Care | Payer: Medicare Other | Attending: Emergency Medicine | Admitting: Emergency Medicine

## 2023-03-03 ENCOUNTER — Encounter (HOSPITAL_BASED_OUTPATIENT_CLINIC_OR_DEPARTMENT_OTHER): Payer: Self-pay | Admitting: Emergency Medicine

## 2023-03-03 ENCOUNTER — Other Ambulatory Visit: Payer: Self-pay

## 2023-03-03 DIAGNOSIS — M352 Behcet's disease: Secondary | ICD-10-CM | POA: Diagnosis not present

## 2023-03-03 DIAGNOSIS — Z8674 Personal history of sudden cardiac arrest: Secondary | ICD-10-CM | POA: Insufficient documentation

## 2023-03-03 DIAGNOSIS — R6884 Jaw pain: Secondary | ICD-10-CM | POA: Insufficient documentation

## 2023-03-03 DIAGNOSIS — M542 Cervicalgia: Secondary | ICD-10-CM | POA: Diagnosis present

## 2023-03-03 DIAGNOSIS — M79602 Pain in left arm: Secondary | ICD-10-CM | POA: Diagnosis not present

## 2023-03-03 DIAGNOSIS — K760 Fatty (change of) liver, not elsewhere classified: Secondary | ICD-10-CM | POA: Diagnosis not present

## 2023-03-03 DIAGNOSIS — R079 Chest pain, unspecified: Secondary | ICD-10-CM | POA: Insufficient documentation

## 2023-03-03 LAB — CBC
HCT: 45.7 % (ref 39.0–52.0)
Hemoglobin: 15.2 g/dL (ref 13.0–17.0)
MCH: 31.3 pg (ref 26.0–34.0)
MCHC: 33.3 g/dL (ref 30.0–36.0)
MCV: 94.2 fL (ref 80.0–100.0)
Platelets: 222 10*3/uL (ref 150–400)
RBC: 4.85 MIL/uL (ref 4.22–5.81)
RDW: 12.1 % (ref 11.5–15.5)
WBC: 9.5 10*3/uL (ref 4.0–10.5)
nRBC: 0 % (ref 0.0–0.2)

## 2023-03-03 LAB — BASIC METABOLIC PANEL
Anion gap: 10 (ref 5–15)
BUN: 14 mg/dL (ref 6–20)
CO2: 25 mmol/L (ref 22–32)
Calcium: 9.4 mg/dL (ref 8.9–10.3)
Chloride: 104 mmol/L (ref 98–111)
Creatinine, Ser: 1.08 mg/dL (ref 0.61–1.24)
GFR, Estimated: >60 mL/min (ref >60)
Glucose, Bld: 117 mg/dL — ABNORMAL HIGH (ref 70–99)
Potassium: 3.8 mmol/L (ref 3.5–5.1)
Sodium: 139 mmol/L (ref 135–145)

## 2023-03-03 LAB — HEPATIC FUNCTION PANEL
ALT: 40 U/L (ref 0–44)
AST: 24 U/L (ref 15–41)
Albumin: 4.7 g/dL (ref 3.5–5.0)
Alkaline Phosphatase: 85 U/L (ref 38–126)
Bilirubin, Direct: 0.1 mg/dL (ref 0.0–0.2)
Indirect Bilirubin: 0.2 mg/dL — ABNORMAL LOW (ref 0.3–0.9)
Total Bilirubin: 0.3 mg/dL (ref <1.2)
Total Protein: 7.7 g/dL (ref 6.5–8.1)

## 2023-03-03 LAB — TROPONIN I (HIGH SENSITIVITY)
Troponin I (High Sensitivity): 3 ng/L (ref <18)
Troponin I (High Sensitivity): 4 ng/L (ref <18)

## 2023-03-03 LAB — BRAIN NATRIURETIC PEPTIDE: B Natriuretic Peptide: 14.8 pg/mL (ref 0.0–100.0)

## 2023-03-03 MED ORDER — KETOROLAC TROMETHAMINE 15 MG/ML IJ SOLN
15.0000 mg | Freq: Once | INTRAMUSCULAR | Status: AC
  Administered 2023-03-03: 15 mg via INTRAVENOUS
  Filled 2023-03-03: qty 1

## 2023-03-03 MED ORDER — MORPHINE SULFATE (PF) 4 MG/ML IV SOLN
4.0000 mg | Freq: Once | INTRAVENOUS | Status: AC
  Administered 2023-03-03: 4 mg via INTRAVENOUS
  Filled 2023-03-03: qty 1

## 2023-03-03 MED ORDER — IOHEXOL 350 MG/ML SOLN
100.0000 mL | Freq: Once | INTRAVENOUS | Status: AC | PRN
  Administered 2023-03-03: 100 mL via INTRAVENOUS

## 2023-03-03 MED ORDER — LACTATED RINGERS IV BOLUS
1000.0000 mL | Freq: Once | INTRAVENOUS | Status: AC
  Administered 2023-03-03: 1000 mL via INTRAVENOUS

## 2023-03-03 NOTE — ED Notes (Signed)
To CT scan with RN on portable monitor

## 2023-03-03 NOTE — Discharge Instructions (Signed)
Your workup today was reassuring.  It did show some slight inflammation of your left sinus.  If you develop nasal drainage, facial pain or severe headache you should return to the ED.  Your CT scans were reassuring.  You should follow close with your primary care doctor, rheumatologist, and cardiologist.  If you develop any chest pain, shortness of breath, fever or any other new concerning symptoms you should return to the ED.

## 2023-03-03 NOTE — ED Triage Notes (Signed)
Sharp pain down left jaw to left arm constant for about 3 hours, sudden onset, metallic taste in mouth . Pt had cardiac arrest few years ago . Pt had these same symptoms before his cardiac arrest .

## 2023-03-03 NOTE — ED Notes (Signed)
ED Provider at bedside. 

## 2023-03-03 NOTE — ED Provider Notes (Signed)
[place] EMERGENCY DEPARTMENT AT [facility] Provider Note   CSN: [id number] Arrival date & time: 03/03/23  1843     History  Chief Complaint  Patient presents with   Arm Pain    [name] [name] is a 42 y.o. male.  HPI 42 year old male history of Behcet's disease, bipolar disorder, seizures, migraine, OSA on CPAP, prior cardiac arrest in September 2019 status post loop recorder placement presenting for neck and arm pain.  Patient states around 3 hours ago he was watching TV.  He developed pain over the left side of his neck below his ear as well as pain that radiates into his left arm from the neck.  No chest pain.  No back pain.  His pain is pleuritic but is not short of breath.  Some nausea and vomiting.  No abdominal pain.  He has history of Behcet's and is on CellCept and [place].  Follows with rheumatology.  Follows with cardiology as well.  In 2021 he had a coronary calcium score of 0, and no signs of disease on cath in 2020.  He has not had any fevers or chills.  He has metallic taste in his mouth denies any drainage or dental disease.  He is otherwise been at his baseline health.  No dizziness or syncope.     Home Medications Prior to Admission medications   Medication Sig Start Date End Date Taking? Authorizing Provider  Apremilast 30 MG TABS Take 30 mg by mouth in the morning and at bedtime.    [provider]  atorvastatin (LIPITOR) 80 MG tablet TAKE 1 TABLET(80 MG) BY MOUTH DAILY 01/31/23   Croitoru, Mihai, MD  azelastine (OPTIVAR) 0.05 % ophthalmic solution Place 2 drops into both eyes 2 (two) times daily. 12/08/22   [provider]  clonazePAM (KLONOPIN) 0.25 MG disintegrating tablet Take 0.25 mg by mouth 3 (three) times daily. 01/05/22   [provider]  FLUoxetine (PROZAC) 40 MG capsule Take 40 mg by mouth 2 (two) times daily.    [provider]  lamoTRIgine 200 MG TBDP DISSOLVE 1 TABLET BY MOUTH EVERY MORNING AND EVERY NIGHT AT  BEDTIME 11/30/22   Windell Norfolk, MD  levETIRAcetam (KEPPRA) 750 MG tablet TAKE 1 TABLET(750 MG) BY MOUTH TWICE DAILY 10/31/22   Camara, Amadou, MD  melatonin 5 MG TABS Take 5 mg by mouth at bedtime.    [provider]  montelukast (SINGULAIR) 10 MG tablet Take 10 mg by mouth daily.    [provider]  mycophenolate (CELLCEPT) 500 MG tablet Take 1,000 mg by mouth 2 (two) times daily. 03/20/17   [provider]  QUEtiapine (SEROQUEL) 200 MG tablet Take 200 mg by mouth at bedtime.    [provider]      Allergies    Patient has no known allergies.    Review of Systems   Review of Systems Review of systems completed and notable as per HPI.  ROS otherwise negative.   Physical Exam Updated Vital Signs BP 117/85   Pulse 87   Temp 98.2 F (36.8 C)   Resp (!) 24   Wt 113.4 kg   SpO2 99%   BMI 33.91 kg/m  Physical Exam Vitals and nursing note reviewed.  Constitutional:      General: He is not in acute distress.    Appearance: He is well-developed.  HENT:     Head: Normocephalic and atraumatic.     Nose: Nose normal.     Mouth/Throat:  Mouth: Mucous membranes are moist.     Pharynx: Oropharynx is clear.  Eyes:     Extraocular Movements: Extraocular movements intact.     Conjunctiva/sclera: Conjunctivae normal.     Pupils: Pupils are equal, round, and reactive to light.  Neck:     Comments: Mild tenderness over the left anterior cervical lymph nodes as well as no significant lymphadenopathy.  Ears normal without effusion.  Mastoids nontender.  No neck stiffness or posterior neck pain.  No tenderness under the mandible or under the tongue.  Normal dentition. Cardiovascular:     Rate and Rhythm: Normal rate and regular rhythm.     Pulses: Normal pulses.     Heart sounds: Normal heart sounds. No murmur heard. Pulmonary:     Effort: Pulmonary effort is normal. No respiratory distress.     Breath sounds: Normal breath sounds.  Abdominal:      Palpations: Abdomen is soft.     Tenderness: There is no abdominal tenderness. There is no guarding or rebound.  Musculoskeletal:        General: No swelling or tenderness.     Cervical back: Normal range of motion and neck supple. No rigidity.     Right lower leg: No edema.     Left lower leg: No edema.  Skin:    General: Skin is warm and dry.     Capillary Refill: Capillary refill takes less than 2 seconds.  Neurological:     General: No focal deficit present.     Mental Status: He is alert and oriented to person, place, and time. Mental status is at baseline.     Cranial Nerves: No cranial nerve deficit.     Sensory: No sensory deficit.     Motor: No weakness.  Psychiatric:        Mood and Affect: Mood normal.     ED Results / Procedures / Treatments   Labs (all labs ordered are listed, but only abnormal results are displayed) Labs Reviewed  BASIC METABOLIC PANEL - Abnormal; Notable for the following components:      Result Value   Glucose, Bld 117 (*)    All other components within normal limits  HEPATIC FUNCTION PANEL - Abnormal; Notable for the following components:   Indirect Bilirubin 0.2 (*)    All other components within normal limits  CBC  BRAIN NATRIURETIC PEPTIDE  TROPONIN I (HIGH SENSITIVITY)  TROPONIN I (HIGH SENSITIVITY)    EKG EKG Interpretation Date/Time:  Saturday March 03 2023 18:56:39 EST Ventricular Rate:  106 PR Interval:  154 QRS Duration:  82 QT Interval:  334 QTC Calculation: 443 R Axis:   62  Text Interpretation: Sinus tachycardia Cannot rule out Inferior infarct (cited on or before 02-Jan-2022) No significant change since last tracing When compared with ECG of 02-Oct-2022 22:03, No significant change was found Confirmed by Fulton Reek [phone]) on 03/03/2023 7:10:11 PM  Radiology CT Soft Tissue Neck W Contrast  Result Date: 03/03/2023 CLINICAL DATA:  Initial evaluation for acute epiglottitis or tonsillitis, left-sided neck pain.  EXAM: CT NECK WITH CONTRAST TECHNIQUE: Multidetector CT imaging of the neck was performed using the standard protocol following the bolus administration of intravenous contrast. RADIATION DOSE REDUCTION: This exam was performed according to the departmental dose-optimization program which includes automated exposure control, adjustment of the mA and/or kV according to patient size and/or use of iterative reconstruction technique. CONTRAST:  OMNIPAQUE IOHEXOL 350 MG/ML SOLN COMPARISON:  None available. FINDINGS: Pharynx and larynx:  Oral cavity within normal limits. Oropharynx and nasopharynx within normal limits. No retropharyngeal collection. Negative epiglottis. Hypopharynx, supraglottic larynx, glottis within normal limits. Subglottic airway clear. Salivary glands: Parotid and submandibular glands within normal limits. Thyroid: Normal. Lymph nodes: No enlarged or pathologic adenopathy. Vascular: Normal intravascular enhancement seen within the neck. Limited intracranial: Unremarkable. Visualized orbits: Unremarkable. Mastoids and visualized paranasal sinuses: Mucosal thickening with small volume pneumatized secretions present within the left maxillary sinus. Paranasal sinuses are otherwise largely clear. Mastoid air cells and middle ear cavities are clear. Skeleton: No discrete or worrisome osseous lesions. Sequelae of prior ORIF noted about the maxilla and mandible. Upper chest: Better evaluated on concomitant CT of the chest. Other: None. IMPRESSION: 1. Normal CT of the neck. No acute inflammatory process identified. 2. Left maxillary sinusitis. Electronically Signed   By: Rise Mu M.D.   On: 03/03/2023 22:15   CT Angio Chest/Abd/Pel for Dissection W and/or W/WO  Result Date: 03/03/2023 CLINICAL DATA:  Pulmonary embolus suspected with high probability. Sharp pain in the left jaw and left arm for 3 hours. Sudden onset. History of cardiac arrest. EXAM: CT ANGIOGRAPHY CHEST, ABDOMEN AND  PELVIS TECHNIQUE: Non-contrast CT of the chest was initially obtained. Multidetector CT imaging through the chest, abdomen and pelvis was performed using the standard protocol during bolus administration of intravenous contrast. Multiplanar reconstructed images and MIPs were obtained and reviewed to evaluate the vascular anatomy. RADIATION DOSE REDUCTION: This exam was performed according to the departmental dose-optimization program which includes automated exposure control, adjustment of the mA and/or kV according to patient size and/or use of iterative reconstruction technique. CONTRAST:  OMNIPAQUE IOHEXOL 350 MG/ML SOLN COMPARISON:  CT abdomen and pelvis 01/22/2022. CT chest abdomen and pelvis 12/25/2017 FINDINGS: CTA CHEST FINDINGS Cardiovascular: Unenhanced images of the chest demonstrate no significant aortic or coronary artery calcification. No acute intramural hematoma. Images obtained during arterial phase after intravenous contrast material administration demonstrate normal caliber thoracic aorta. No aneurysm or dissection. Great vessel origins are patent. Central pulmonary arteries are moderately well opacified without evidence of significant pulmonary embolus. Mediastinum/Nodes: No enlarged mediastinal, hilar, or axillary lymph nodes. Thyroid gland, trachea, and esophagus demonstrate no significant findings. Lungs/Pleura: Motion artifact limits examination. Suggestion of mosaic attenuation pattern to the lungs. This may indicate air trapping or edema. No pleural effusion or pneumothorax. Musculoskeletal: No chest wall abnormality. No acute or significant osseous findings. Review of the MIP images confirms the above findings. CTA ABDOMEN AND PELVIS FINDINGS VASCULAR Aorta: Normal caliber aorta without aneurysm, dissection, vasculitis or significant stenosis. Celiac: Patent without evidence of aneurysm, dissection, vasculitis or significant stenosis. SMA: Patent without evidence of aneurysm,  dissection, vasculitis or significant stenosis. Renals: Both renal arteries are patent without evidence of aneurysm, dissection, vasculitis, fibromuscular dysplasia or significant stenosis. IMA: Patent without evidence of aneurysm, dissection, vasculitis or significant stenosis. Inflow: Patent without evidence of aneurysm, dissection, vasculitis or significant stenosis. Veins: No obvious venous abnormality within the limitations of this arterial phase study. Review of the MIP images confirms the above findings. NON-VASCULAR Hepatobiliary: Diffuse fatty infiltration of the liver. No focal lesions. Gallbladder and bile ducts are normal. Pancreas: Unremarkable. No pancreatic ductal dilatation or surrounding inflammatory changes. Spleen: Normal in size without focal abnormality. Adrenals/Urinary Tract: Adrenal glands are unremarkable. Kidneys are normal, without renal calculi, focal lesion, or hydronephrosis. Bladder is unremarkable. Stomach/Bowel: Stomach is within normal limits. Appendix is not identified. No evidence of bowel wall thickening, distention, or inflammatory changes. Lymphatic: No significant lymphadenopathy. Reproductive: Prostate  is unremarkable. Other: No abdominal wall hernia or abnormality. No abdominopelvic ascites. Musculoskeletal: No acute or significant osseous findings. Review of the MIP images confirms the above findings. IMPRESSION: 1. No evidence of aortic aneurysm or dissection. 2. No evidence of significant pulmonary embolus. 3. Mosaic attenuation pattern to the lungs could be due to air trapping or edema. 4. Fatty infiltration of the liver. Electronically Signed   By: Burman Nieves M.D.   On: 03/03/2023 20:49   DG Chest Port 1 View  Result Date: 03/03/2023 CLINICAL DATA:  Chest pain EXAM: PORTABLE CHEST 1 VIEW COMPARISON:  Chest x-ray 10/02/2022 FINDINGS: The heart size and mediastinal contours are within normal limits. Both lungs are clear. The visualized skeletal structures are  unremarkable. Loop recorder device overlies the left chest. IMPRESSION: No active disease. Electronically Signed   By: Darliss Cheney M.D.   On: 03/03/2023 19:39    Procedures Procedures    Medications Ordered in ED Medications  morphine (PF) 4 MG/ML injection 4 mg (4 mg Intravenous Given 03/03/23 1941)  lactated ringers bolus 1,000 mL (0 mLs Intravenous Stopped 03/03/23 2101)  iohexol (OMNIPAQUE) 350 MG/ML injection 100 mL (100 mLs Intravenous Contrast Given 03/03/23 2023)  ketorolac (TORADOL) 15 MG/ML injection 15 mg (15 mg Intravenous Given 03/03/23 2240)    ED Course/ Medical Decision Making/ A&P Clinical Course as of 03/03/23 2353  Sat Mar 03, 2023  1945 Bedside ultrasound good EF, no pericardial effusion.  Chest x-ray reviewed, no signs of acute abnormality. [JD]    Clinical Course User Index [JD] Laurence Spates, MD                                 Medical Decision Making Amount and/or Complexity of Data Reviewed Labs: ordered. Radiology: ordered.  Risk Prescription drug management.   Medical Decision Making:   [name] [name] is a 42 y.o. male who presented to the ED today with left arm and neck pain.  He arrives mildly tachycardic.  Slightly uncomfortable.  Is tender on the left side of his anterior neck, no skin changes or swelling here and states this is where the pain is.  Also reports some pleuritic pain here when he breathes.  He has no chest pain, no back pain.  Consider possible dissection.  I am concerned possible PE given his tachycardia and pleuritic component and his history of Behcet's disease.  CT chest as well as CT neck ordered.  No signs of sepsis he is afebrile without leukocytosis.  EKG is unchanged from prior.  Initial troponin is normal.  Will obtain delta troponin for evaluation possible ACS.  He is maintaining his airway, no stridor, no airway swelling or signs of Ludwig's angina or PTA or RPA clinically.  Normal neurologic exam.   Patient placed on  continuous vitals and telemetry monitoring while in ED which was reviewed periodically.  Reviewed and confirmed nursing documentation for past medical history, family history, social history.  Reassessment and Plan:   Reviewed his lab work.  Troponin is negative x 2, he had normal heart cath in 2020, and unchanged EKG I have lower suspicion for ACS but showed no actual chest pain.  CBC, CMP are overall unremarkable.  CT chest and pelvis shows no signs of dissection, PE.  He has some mosaic attenuation to his lungs and no signs of pneumonia, read possible edema although no shortness of breath, BNP is normal.  CT  neck is unremarkable other than possible sinusitis.  Clinically has no signs or symptoms of sinusitis including no sinus tenderness or drainage.  I do not think needs antibiotics at this time.  He feels better.  His pain is still only over that left anterior neck and is reproducible.  Never had any trauma.  He is a very thorough workup here which is overall been quite reassuring.  I discussed with him that his work appears very reassuring.  I do not think there is an indication for admission at this time.  He would really like to go home which I think is reasonable given extensive normal workup.  I given strict return precautions recommend follow-up closely with his rheumatologist, PCP, cardiologist.  He was comfortable this plan.  Discharged in stable condition.   Patient's presentation is most consistent with acute complicated illness / injury requiring diagnostic workup.           Final Clinical Impression(s) / ED Diagnoses Final diagnoses:  Neck pain    Rx / DC Orders ED Discharge Orders     None         Laurence Spates, MD 03/03/23 2354

## 2023-03-05 NOTE — Progress Notes (Unsigned)
  Cardiology Office Note:  .   Date:  03/06/2023  ID:  [name] [name], DOB [birth year], MRN [id number] PCP: Soundra Pilon, FNP  [facility] Providers Cardiologist:  Thurmon Fair, MD   }   History of Present Illness: .   [name] [name] is a 42 y.o. male hx of Behcet sd., bipolar disorder, hyperlipidemia, seizures, migraine headaches, obstructive sleep apnea on CPAP, with a reported cardiac arrest event in September 2019, s/p loop recorder implantation May 2021.  ILR has not shown any evidence of arrhythmias, pauses, or atrial fib.  Cardiac catheterization 2020 revealed no evidence of coronary artery stenosis.  Last seen by Dr. Royann Shivers on 01/29/2023 and was stable.  Was seen in the ED on 03/03/2023 with left arm pain, left side of his neck below his ear.  He was ruled out for ACS.  He was treated with morphine and Toradol.  He was ruled out for pericardial effusion, aortic dissection, and PE.  He was not found to have an infection.  He was released since he was pain-free and was to follow-up with primary care, rheumatology, and cardiology.  He comes today without any new complaints.  Neck and shoulder pain have subsided currently.  His main complaint now is metallic taste in his mouth which is there all the time.  He denies racing heart rate, chest pain, dyspnea on exertion, or fatigue.  He has been steadily gaining weight and has admitted to eating out a good bit.   ROS: As above otherwise negative.  Studies Reviewed: Marland Kitchen      [facility] 11/29/2018 The left ventricular systolic function is normal. LV end diastolic pressure is normal. The left ventricular ejection fraction is 55-65% by visual estimate.   1. Normal coronary anatomy 2. Normal LV function 3. Normal LVEDP   Physical Exam:   VS:  BP (!) 136/98 (BP Location: Right Arm, Patient Position: Sitting, Cuff Size: Normal)   Pulse 92   Ht 6' (1.829 m)   Wt 253 lb 9.6 oz (115 kg)   SpO2 99%   BMI 34.39 kg/m    Wt Readings from Last 3  Encounters:  03/06/23 253 lb 9.6 oz (115 kg)  03/03/23 250 lb (113.4 kg)  01/29/23 254 lb 9.6 oz (115.5 kg)    GEN: Well nourished, well developed in no acute distress, obese NECK: No JVD; No carotid bruits CARDIAC: RRR, no murmurs, rubs, gallops RESPIRATORY:  Clear to auscultation without rales, wheezing or rhonchi  ABDOMEN: Soft, non-tender, non-distended EXTREMITIES:  No edema; No deformity chronic skin discoloration of the lower extremities.  Especially in the feet.  ASSESSMENT AND PLAN: .    Hypertension: Blood pressure is well-controlled currently.  Will not make any changes in his medication regimen at this time.  He is currently not on any antihypertensives.  2.  Musculoskeletal pain: Seen in the ED with neck pain and left shoulder and arm pain.  He was ruled out for ACS.  Unlikely cardiac in etiology with normal left heart catheterization 4 years ago revealing no evidence of coronary artery disease.  He is given reassurance.  Continue restratification with weight loss, lipid management, and purposeful exercise.  3.  Hypercholesterolemia: Remains on atorvastatin 80 mg daily.  Labs are followed by PCP.  He is scheduled to see PCP tomorrow.         Signed, Bettey Mare. Liborio Nixon, ANP, AACC

## 2023-03-06 ENCOUNTER — Ambulatory Visit: Payer: Medicare Other | Attending: Adult Health | Admitting: Adult Health

## 2023-03-06 ENCOUNTER — Encounter: Payer: Self-pay | Admitting: Adult Health

## 2023-03-06 VITALS — BP 136/98 | HR 92 | Ht 72.0 in | Wt 253.6 lb

## 2023-03-06 DIAGNOSIS — M7918 Myalgia, other site: Secondary | ICD-10-CM | POA: Insufficient documentation

## 2023-03-06 DIAGNOSIS — I10 Essential (primary) hypertension: Secondary | ICD-10-CM | POA: Diagnosis present

## 2023-03-06 DIAGNOSIS — G8929 Other chronic pain: Secondary | ICD-10-CM | POA: Insufficient documentation

## 2023-03-06 DIAGNOSIS — E7800 Pure hypercholesterolemia, unspecified: Secondary | ICD-10-CM | POA: Insufficient documentation

## 2023-03-06 NOTE — Patient Instructions (Signed)
Medication Instructions:  No Changes *If you need a refill on your cardiac medications before your next appointment, please call your pharmacy*   Lab Work: No Labs If you have labs (blood work) drawn today and your tests are completely normal, you will receive your results only by: [place] (if you have MyChart) OR A paper copy in the mail If you have any lab test that is abnormal or we need to change your treatment, we will call you to review the results.   Testing/Procedures: No Testing   Follow-Up: At [facility], you and your health needs are our priority.  As part of our continuing mission to provide you with exceptional heart care, we have created designated Provider Care Teams.  These Care Teams include your primary Cardiologist (physician) and Advanced Practice Providers (APPs -  Physician Assistants and Nurse Practitioners) who all work together to provide you with the care you need, when you need it.  We recommend signing up for the patient portal called "MyChart".  Sign up information is provided on this After Visit Summary.  MyChart is used to connect with patients for Virtual Visits (Telemedicine).  Patients are able to view lab/test results, encounter notes, upcoming appointments, etc.  Non-urgent messages can be sent to your provider as well.   To learn more about what you can do with MyChart, go to [email or web address].    Your next appointment:   1 year(s)  Provider:   Sanda Klein, MD

## 2023-03-12 ENCOUNTER — Ambulatory Visit (INDEPENDENT_AMBULATORY_CARE_PROVIDER_SITE_OTHER): Payer: Medicare Other

## 2023-03-12 DIAGNOSIS — R55 Syncope and collapse: Secondary | ICD-10-CM | POA: Diagnosis not present

## 2023-03-12 LAB — CUP PACEART REMOTE DEVICE CHECK
Date Time Interrogation Session: 20241208230301
Implantable Pulse Generator Implant Date: 20210512

## 2023-04-16 ENCOUNTER — Ambulatory Visit (INDEPENDENT_AMBULATORY_CARE_PROVIDER_SITE_OTHER): Payer: Medicare Other

## 2023-04-16 DIAGNOSIS — R55 Syncope and collapse: Secondary | ICD-10-CM | POA: Diagnosis not present

## 2023-04-16 LAB — CUP PACEART REMOTE DEVICE CHECK
Date Time Interrogation Session: 20250112230301
Implantable Pulse Generator Implant Date: 20210512

## 2023-05-17 ENCOUNTER — Other Ambulatory Visit: Payer: Self-pay

## 2023-05-17 ENCOUNTER — Encounter (HOSPITAL_BASED_OUTPATIENT_CLINIC_OR_DEPARTMENT_OTHER): Payer: Self-pay

## 2023-05-17 DIAGNOSIS — F319 Bipolar disorder, unspecified: Secondary | ICD-10-CM | POA: Diagnosis not present

## 2023-05-17 DIAGNOSIS — R109 Unspecified abdominal pain: Secondary | ICD-10-CM | POA: Diagnosis present

## 2023-05-17 DIAGNOSIS — M352 Behcet's disease: Secondary | ICD-10-CM | POA: Insufficient documentation

## 2023-05-17 DIAGNOSIS — G40909 Epilepsy, unspecified, not intractable, without status epilepticus: Secondary | ICD-10-CM | POA: Insufficient documentation

## 2023-05-17 DIAGNOSIS — I10 Essential (primary) hypertension: Secondary | ICD-10-CM | POA: Insufficient documentation

## 2023-05-17 DIAGNOSIS — E785 Hyperlipidemia, unspecified: Secondary | ICD-10-CM | POA: Insufficient documentation

## 2023-05-17 DIAGNOSIS — F32A Depression, unspecified: Secondary | ICD-10-CM | POA: Insufficient documentation

## 2023-05-17 DIAGNOSIS — Z79899 Other long term (current) drug therapy: Secondary | ICD-10-CM | POA: Insufficient documentation

## 2023-05-17 DIAGNOSIS — K811 Chronic cholecystitis: Principal | ICD-10-CM | POA: Insufficient documentation

## 2023-05-17 LAB — COMPREHENSIVE METABOLIC PANEL
ALT: 22 U/L (ref 0–44)
AST: 14 U/L — ABNORMAL LOW (ref 15–41)
Albumin: 4.4 g/dL (ref 3.5–5.0)
Alkaline Phosphatase: 80 U/L (ref 38–126)
Anion gap: 11 (ref 5–15)
BUN: 19 mg/dL (ref 6–20)
CO2: 22 mmol/L (ref 22–32)
Calcium: 9.6 mg/dL (ref 8.9–10.3)
Chloride: 106 mmol/L (ref 98–111)
Creatinine, Ser: 1.06 mg/dL (ref 0.61–1.24)
GFR, Estimated: >60 mL/min (ref >60)
Glucose, Bld: 99 mg/dL (ref 70–99)
Potassium: 3.6 mmol/L (ref 3.5–5.1)
Sodium: 139 mmol/L (ref 135–145)
Total Bilirubin: 0.3 mg/dL (ref 0.0–1.2)
Total Protein: 7.1 g/dL (ref 6.5–8.1)

## 2023-05-17 LAB — CBC
HCT: 44.9 % (ref 39.0–52.0)
Hemoglobin: 15.1 g/dL (ref 13.0–17.0)
MCH: 30.7 pg (ref 26.0–34.0)
MCHC: 33.6 g/dL (ref 30.0–36.0)
MCV: 91.3 fL (ref 80.0–100.0)
Platelets: 189 10*3/uL (ref 150–400)
RBC: 4.92 MIL/uL (ref 4.22–5.81)
RDW: 12.1 % (ref 11.5–15.5)
WBC: 8.7 10*3/uL (ref 4.0–10.5)
nRBC: 0 % (ref 0.0–0.2)

## 2023-05-17 LAB — URINALYSIS, ROUTINE W REFLEX MICROSCOPIC
Bilirubin Urine: NEGATIVE
Glucose, UA: NEGATIVE mg/dL
Hgb urine dipstick: NEGATIVE
Leukocytes,Ua: NEGATIVE
Nitrite: NEGATIVE
Specific Gravity, Urine: 1.045 — ABNORMAL HIGH (ref 1.005–1.030)
pH: 6 (ref 5.0–8.0)

## 2023-05-17 LAB — LIPASE, BLOOD: Lipase: 35 U/L (ref 11–51)

## 2023-05-17 NOTE — ED Triage Notes (Signed)
Pt reports he is here today due to abd pain with N&V&D. Pt reports it started x2 days. Pt reports the pain is located on the RUQ

## 2023-05-18 ENCOUNTER — Encounter (HOSPITAL_COMMUNITY)
Admission: EM | Disposition: A | Discharge status: Home / Self Care | Payer: Self-pay | Source: Home / Self Care | Attending: Emergency Medicine

## 2023-05-18 ENCOUNTER — Observation Stay (HOSPITAL_BASED_OUTPATIENT_CLINIC_OR_DEPARTMENT_OTHER): Payer: Medicare Other | Admitting: Anesthesiology

## 2023-05-18 ENCOUNTER — Emergency Department (HOSPITAL_BASED_OUTPATIENT_CLINIC_OR_DEPARTMENT_OTHER): Payer: Medicare Other

## 2023-05-18 ENCOUNTER — Encounter (HOSPITAL_COMMUNITY): Payer: Self-pay | Admitting: Internal Medicine

## 2023-05-18 ENCOUNTER — Observation Stay (HOSPITAL_BASED_OUTPATIENT_CLINIC_OR_DEPARTMENT_OTHER)
Admission: EM | Admit: 2023-05-18 | Discharge: 2023-05-19 | Disposition: A | Discharge status: Home / Self Care | Payer: Medicare Other | Attending: Emergency Medicine | Admitting: Emergency Medicine

## 2023-05-18 ENCOUNTER — Other Ambulatory Visit: Payer: Self-pay

## 2023-05-18 ENCOUNTER — Observation Stay (HOSPITAL_COMMUNITY): Payer: Medicare Other | Admitting: Anesthesiology

## 2023-05-18 DIAGNOSIS — F32A Depression, unspecified: Secondary | ICD-10-CM | POA: Diagnosis not present

## 2023-05-18 DIAGNOSIS — Z79899 Other long term (current) drug therapy: Secondary | ICD-10-CM | POA: Diagnosis not present

## 2023-05-18 DIAGNOSIS — Z01810 Encounter for preprocedural cardiovascular examination: Secondary | ICD-10-CM | POA: Diagnosis not present

## 2023-05-18 DIAGNOSIS — I10 Essential (primary) hypertension: Secondary | ICD-10-CM | POA: Diagnosis not present

## 2023-05-18 DIAGNOSIS — K811 Chronic cholecystitis: Secondary | ICD-10-CM | POA: Diagnosis not present

## 2023-05-18 DIAGNOSIS — R109 Unspecified abdominal pain: Secondary | ICD-10-CM | POA: Diagnosis present

## 2023-05-18 DIAGNOSIS — G40909 Epilepsy, unspecified, not intractable, without status epilepticus: Secondary | ICD-10-CM | POA: Diagnosis not present

## 2023-05-18 DIAGNOSIS — F319 Bipolar disorder, unspecified: Secondary | ICD-10-CM | POA: Diagnosis not present

## 2023-05-18 DIAGNOSIS — F418 Other specified anxiety disorders: Secondary | ICD-10-CM

## 2023-05-18 DIAGNOSIS — M352 Behcet's disease: Secondary | ICD-10-CM | POA: Diagnosis not present

## 2023-05-18 DIAGNOSIS — K810 Acute cholecystitis: Secondary | ICD-10-CM

## 2023-05-18 DIAGNOSIS — K819 Cholecystitis, unspecified: Principal | ICD-10-CM

## 2023-05-18 DIAGNOSIS — E785 Hyperlipidemia, unspecified: Secondary | ICD-10-CM | POA: Diagnosis not present

## 2023-05-18 HISTORY — PX: CHOLECYSTECTOMY: SHX55

## 2023-05-18 LAB — TROPONIN I (HIGH SENSITIVITY)
Troponin I (High Sensitivity): 3 ng/L (ref <18)
Troponin I (High Sensitivity): 3 ng/L (ref <18)

## 2023-05-18 SURGERY — LAPAROSCOPIC CHOLECYSTECTOMY WITH INTRAOPERATIVE CHOLANGIOGRAM
Anesthesia: General

## 2023-05-18 MED ORDER — ONDANSETRON HCL 4 MG/2ML IJ SOLN
INTRAMUSCULAR | Status: AC
  Filled 2023-05-18: qty 2

## 2023-05-18 MED ORDER — ONDANSETRON HCL 4 MG/2ML IJ SOLN
INTRAMUSCULAR | Status: DC | PRN
Start: 2023-05-18 — End: 2023-05-18
  Administered 2023-05-18: 4 mg via INTRAVENOUS

## 2023-05-18 MED ORDER — ROCURONIUM BROMIDE 10 MG/ML (PF) SYRINGE
PREFILLED_SYRINGE | INTRAVENOUS | Status: AC
  Filled 2023-05-18: qty 10

## 2023-05-18 MED ORDER — LIDOCAINE HCL 2 % IJ SOLN
INTRAMUSCULAR | Status: AC
  Filled 2023-05-18: qty 20

## 2023-05-18 MED ORDER — ACETAMINOPHEN 325 MG PO TABS: 650.0000 mg | ORAL_TABLET | Freq: Four times a day (QID) | ORAL | Status: DC | PRN

## 2023-05-18 MED ORDER — OXYCODONE HCL 5 MG/5ML PO SOLN: 5.0000 mg | Freq: Once | ORAL | Status: AC | PRN

## 2023-05-18 MED ORDER — LIDOCAINE HCL (CARDIAC) PF 100 MG/5ML IV SOSY
PREFILLED_SYRINGE | INTRAVENOUS | Status: DC | PRN
  Administered 2023-05-18: 50 mg via INTRATRACHEAL
  Administered 2023-05-18: 80 mg via INTRATRACHEAL

## 2023-05-18 MED ORDER — MYCOPHENOLATE MOFETIL 250 MG PO CAPS
1000.0000 mg | ORAL_CAPSULE | Freq: Every day | ORAL | Status: DC
  Administered 2023-05-18: 1000 mg via ORAL
  Filled 2023-05-18 (×3): qty 4

## 2023-05-18 MED ORDER — DEXAMETHASONE SODIUM PHOSPHATE 4 MG/ML IJ SOLN
INTRAMUSCULAR | Status: DC | PRN
  Administered 2023-05-18: 8 mg via INTRAVENOUS

## 2023-05-18 MED ORDER — OXYCODONE HCL 5 MG PO TABS: 5.0000 mg | ORAL_TABLET | ORAL | Status: DC | PRN

## 2023-05-18 MED ORDER — ROCURONIUM BROMIDE 10 MG/ML (PF) SYRINGE
PREFILLED_SYRINGE | INTRAVENOUS | Status: AC
Start: 2023-05-18 — End: ?
  Filled 2023-05-18: qty 10

## 2023-05-18 MED ORDER — FENTANYL CITRATE (PF) 100 MCG/2ML IJ SOLN
INTRAMUSCULAR | Status: AC
Start: 2023-05-18 — End: ?
  Filled 2023-05-18: qty 2

## 2023-05-18 MED ORDER — SODIUM CHLORIDE 0.9 % IV SOLN: 2.0000 g | INTRAVENOUS | Status: DC

## 2023-05-18 MED ORDER — FENTANYL CITRATE PF 50 MCG/ML IJ SOSY
25.0000 ug | PREFILLED_SYRINGE | INTRAMUSCULAR | Status: DC | PRN
  Administered 2023-05-18: 50 ug via INTRAVENOUS

## 2023-05-18 MED ORDER — OXYCODONE HCL 5 MG PO TABS
5.0000 mg | ORAL_TABLET | Freq: Once | ORAL | Status: AC | PRN
  Administered 2023-05-18: 5 mg via ORAL

## 2023-05-18 MED ORDER — LACTATED RINGERS IV BOLUS
1000.0000 mL | Freq: Once | INTRAVENOUS | Status: AC
  Administered 2023-05-18: 1000 mL via INTRAVENOUS

## 2023-05-18 MED ORDER — HYDROMORPHONE HCL 1 MG/ML IJ SOLN
0.5000 mg | INTRAMUSCULAR | Status: DC | PRN
  Administered 2023-05-18: 0.5 mg via INTRAVENOUS
  Filled 2023-05-18: qty 1

## 2023-05-18 MED ORDER — QUETIAPINE FUMARATE 50 MG PO TABS
200.0000 mg | ORAL_TABLET | Freq: Every day | ORAL | Status: DC
  Administered 2023-05-18: 200 mg via ORAL
  Filled 2023-05-18: qty 4

## 2023-05-18 MED ORDER — SUGAMMADEX SODIUM 200 MG/2ML IV SOLN
INTRAVENOUS | Status: DC | PRN
  Administered 2023-05-18: 200 mg via INTRAVENOUS

## 2023-05-18 MED ORDER — ACETAMINOPHEN 10 MG/ML IV SOLN
INTRAVENOUS | Status: AC
  Filled 2023-05-18: qty 100

## 2023-05-18 MED ORDER — METRONIDAZOLE 500 MG/100ML IV SOLN
500.0000 mg | Freq: Two times a day (BID) | INTRAVENOUS | Status: DC
  Administered 2023-05-18 (×2): 500 mg via INTRAVENOUS
  Filled 2023-05-18 (×2): qty 100

## 2023-05-18 MED ORDER — FENTANYL CITRATE (PF) 100 MCG/2ML IJ SOLN
INTRAMUSCULAR | Status: DC | PRN
  Administered 2023-05-18 (×2): 50 ug via INTRAVENOUS

## 2023-05-18 MED ORDER — MIDAZOLAM HCL 2 MG/2ML IJ SOLN
INTRAMUSCULAR | Status: AC
Start: 2023-05-18 — End: ?
  Filled 2023-05-18: qty 2

## 2023-05-18 MED ORDER — FENTANYL CITRATE PF 50 MCG/ML IJ SOSY
50.0000 ug | PREFILLED_SYRINGE | Freq: Once | INTRAMUSCULAR | Status: AC
  Administered 2023-05-18: 50 ug via INTRAVENOUS
  Filled 2023-05-18: qty 1

## 2023-05-18 MED ORDER — ACETAMINOPHEN 650 MG RE SUPP: 650.0000 mg | Freq: Four times a day (QID) | RECTAL | Status: DC | PRN

## 2023-05-18 MED ORDER — ACETAMINOPHEN 160 MG/5ML PO SOLN: 1000.0000 mg | Freq: Once | ORAL | Status: DC | PRN

## 2023-05-18 MED ORDER — FLUOXETINE HCL 20 MG PO CAPS
40.0000 mg | ORAL_CAPSULE | Freq: Two times a day (BID) | ORAL | Status: DC
  Administered 2023-05-18 – 2023-05-19 (×3): 40 mg via ORAL
  Filled 2023-05-18 (×3): qty 2

## 2023-05-18 MED ORDER — PROPOFOL 10 MG/ML IV BOLUS
INTRAVENOUS | Status: AC
  Filled 2023-05-18: qty 20

## 2023-05-18 MED ORDER — LACTATED RINGERS IV SOLN: INTRAVENOUS | Status: DC

## 2023-05-18 MED ORDER — LACTATED RINGERS IR SOLN
Status: DC | PRN
  Administered 2023-05-18: 1000 mL

## 2023-05-18 MED ORDER — IOHEXOL 300 MG/ML  SOLN
100.0000 mL | Freq: Once | INTRAMUSCULAR | Status: AC | PRN
  Administered 2023-05-18: 100 mL via INTRAVENOUS

## 2023-05-18 MED ORDER — ATORVASTATIN CALCIUM 40 MG PO TABS
80.0000 mg | ORAL_TABLET | Freq: Every day | ORAL | Status: DC
  Administered 2023-05-18 – 2023-05-19 (×2): 80 mg via ORAL
  Filled 2023-05-18 (×2): qty 2

## 2023-05-18 MED ORDER — PHENYLEPHRINE 80 MCG/ML (10ML) SYRINGE FOR IV PUSH (FOR BLOOD PRESSURE SUPPORT)
PREFILLED_SYRINGE | INTRAVENOUS | Status: AC
  Filled 2023-05-18: qty 10

## 2023-05-18 MED ORDER — SODIUM CHLORIDE 0.9 % IV SOLN
2.0000 g | Freq: Once | INTRAVENOUS | Status: AC
  Administered 2023-05-18: 2 g via INTRAVENOUS
  Filled 2023-05-18: qty 20

## 2023-05-18 MED ORDER — ONDANSETRON HCL 4 MG/2ML IJ SOLN
4.0000 mg | Freq: Once | INTRAMUSCULAR | Status: AC
  Administered 2023-05-18: 4 mg via INTRAVENOUS
  Filled 2023-05-18: qty 2

## 2023-05-18 MED ORDER — FENTANYL CITRATE (PF) 100 MCG/2ML IJ SOLN
INTRAMUSCULAR | Status: AC
  Filled 2023-05-18: qty 2

## 2023-05-18 MED ORDER — FENTANYL CITRATE PF 50 MCG/ML IJ SOSY
PREFILLED_SYRINGE | INTRAMUSCULAR | Status: AC
  Administered 2023-05-18: 50 ug via INTRAVENOUS
  Filled 2023-05-18: qty 2

## 2023-05-18 MED ORDER — BUPIVACAINE-EPINEPHRINE 0.25% -1:200000 IJ SOLN
INTRAMUSCULAR | Status: AC
  Filled 2023-05-18: qty 1

## 2023-05-18 MED ORDER — LEVETIRACETAM 500 MG PO TABS
750.0000 mg | ORAL_TABLET | Freq: Two times a day (BID) | ORAL | Status: DC
  Administered 2023-05-18 – 2023-05-19 (×3): 750 mg via ORAL
  Filled 2023-05-18 (×3): qty 1

## 2023-05-18 MED ORDER — OXYCODONE HCL 5 MG PO TABS
ORAL_TABLET | ORAL | Status: AC
  Filled 2023-05-18: qty 1

## 2023-05-18 MED ORDER — SODIUM CHLORIDE 0.9 % IV SOLN: INTRAVENOUS | Status: DC | PRN

## 2023-05-18 MED ORDER — OXYCODONE HCL 5 MG PO TABS: 5.0000 mg | ORAL_TABLET | Freq: Four times a day (QID) | ORAL | 0 refills | Status: AC | PRN

## 2023-05-18 MED ORDER — SUCCINYLCHOLINE CHLORIDE 200 MG/10ML IV SOSY
PREFILLED_SYRINGE | INTRAVENOUS | Status: DC | PRN
  Administered 2023-05-18: 160 mg via INTRAVENOUS

## 2023-05-18 MED ORDER — ENOXAPARIN SODIUM 40 MG/0.4ML IJ SOSY
40.0000 mg | PREFILLED_SYRINGE | INTRAMUSCULAR | Status: DC
  Administered 2023-05-18 – 2023-05-19 (×2): 40 mg via SUBCUTANEOUS
  Filled 2023-05-18 (×2): qty 0.4

## 2023-05-18 MED ORDER — METHOCARBAMOL 500 MG PO TABS
750.0000 mg | ORAL_TABLET | Freq: Four times a day (QID) | ORAL | Status: DC | PRN
  Administered 2023-05-18: 750 mg via ORAL
  Filled 2023-05-18: qty 2

## 2023-05-18 MED ORDER — OXYCODONE HCL 5 MG PO TABS
5.0000 mg | ORAL_TABLET | ORAL | Status: DC | PRN
  Administered 2023-05-18: 5 mg via ORAL
  Filled 2023-05-18: qty 1

## 2023-05-18 MED ORDER — PHENYLEPHRINE HCL (PRESSORS) 10 MG/ML IV SOLN
INTRAVENOUS | Status: AC
  Filled 2023-05-18: qty 1

## 2023-05-18 MED ORDER — ACETAMINOPHEN 500 MG PO TABS
1000.0000 mg | ORAL_TABLET | Freq: Four times a day (QID) | ORAL | Status: DC
  Administered 2023-05-18 – 2023-05-19 (×3): 1000 mg via ORAL
  Filled 2023-05-18 (×4): qty 2

## 2023-05-18 MED ORDER — BUPIVACAINE-EPINEPHRINE 0.25% -1:200000 IJ SOLN
INTRAMUSCULAR | Status: DC | PRN
  Administered 2023-05-18: 20 mL

## 2023-05-18 MED ORDER — LAMOTRIGINE 100 MG PO TABS
200.0000 mg | ORAL_TABLET | Freq: Two times a day (BID) | ORAL | Status: DC
  Administered 2023-05-18 – 2023-05-19 (×3): 200 mg via ORAL
  Filled 2023-05-18 (×3): qty 2

## 2023-05-18 MED ORDER — PROPOFOL 10 MG/ML IV BOLUS
INTRAVENOUS | Status: DC | PRN
Start: 2023-05-18 — End: 2023-05-18
  Administered 2023-05-18: 200 mg via INTRAVENOUS

## 2023-05-18 MED ORDER — LIDOCAINE HCL (PF) 2 % IJ SOLN
INTRAMUSCULAR | Status: AC
  Filled 2023-05-18: qty 5

## 2023-05-18 MED ORDER — MIDAZOLAM HCL 2 MG/2ML IJ SOLN
INTRAMUSCULAR | Status: DC | PRN
  Administered 2023-05-18: 2 mg via INTRAVENOUS

## 2023-05-18 MED ORDER — ONDANSETRON HCL 4 MG/2ML IJ SOLN
4.0000 mg | Freq: Four times a day (QID) | INTRAMUSCULAR | Status: DC | PRN
  Administered 2023-05-18: 4 mg via INTRAVENOUS
  Filled 2023-05-18: qty 2

## 2023-05-18 MED ORDER — ROCURONIUM BROMIDE 100 MG/10ML IV SOLN
INTRAVENOUS | Status: DC | PRN
Start: 2023-05-18 — End: 2023-05-18
  Administered 2023-05-18: 50 mg via INTRAVENOUS

## 2023-05-18 MED ORDER — PHENYLEPHRINE 80 MCG/ML (10ML) SYRINGE FOR IV PUSH (FOR BLOOD PRESSURE SUPPORT)
PREFILLED_SYRINGE | INTRAVENOUS | Status: AC
Start: 2023-05-18 — End: ?
  Filled 2023-05-18: qty 10

## 2023-05-18 MED ORDER — ALBUTEROL SULFATE (2.5 MG/3ML) 0.083% IN NEBU: 2.5000 mg | INHALATION_SOLUTION | RESPIRATORY_TRACT | Status: DC | PRN

## 2023-05-18 MED ORDER — MIDAZOLAM HCL 2 MG/2ML IJ SOLN
INTRAMUSCULAR | Status: AC
  Filled 2023-05-18: qty 2

## 2023-05-18 MED ORDER — MYCOPHENOLATE MOFETIL 250 MG PO CAPS
500.0000 mg | ORAL_CAPSULE | Freq: Every day | ORAL | Status: DC
  Administered 2023-05-19: 500 mg via ORAL
  Filled 2023-05-18 (×2): qty 2

## 2023-05-18 MED ORDER — CHLORHEXIDINE GLUCONATE 0.12 % MT SOLN
15.0000 mL | Freq: Once | OROMUCOSAL | Status: AC
  Administered 2023-05-18: 15 mL via OROMUCOSAL

## 2023-05-18 MED ORDER — ACETAMINOPHEN 10 MG/ML IV SOLN: 1000.0000 mg | Freq: Once | INTRAVENOUS | Status: DC | PRN

## 2023-05-18 MED ORDER — ACETAMINOPHEN 500 MG PO TABS: 1000.0000 mg | ORAL_TABLET | Freq: Once | ORAL | Status: DC | PRN

## 2023-05-18 MED ORDER — HYDROMORPHONE HCL 1 MG/ML IJ SOLN
0.5000 mg | INTRAMUSCULAR | Status: DC | PRN
  Administered 2023-05-18: 1 mg via INTRAVENOUS
  Filled 2023-05-18: qty 1

## 2023-05-18 MED ORDER — HYDROMORPHONE HCL 1 MG/ML IJ SOLN: 0.5000 mg | INTRAMUSCULAR | Status: DC | PRN

## 2023-05-18 SURGICAL SUPPLY — 36 items
APPLIER CLIP ROT 10 11.4 M/L (STAPLE) ×1
BAG COUNTER SPONGE SURGICOUNT (BAG) IMPLANT
CABLE HIGH FREQUENCY MONO STRZ (ELECTRODE) ×2 IMPLANT
CATH URETL OPEN 5X70 (CATHETERS) IMPLANT
CHLORAPREP W/TINT 26 (MISCELLANEOUS) ×2 IMPLANT
CLIP APPLIE ROT 10 11.4 M/L (STAPLE) ×2 IMPLANT
COVER MAYO STAND XLG (MISCELLANEOUS) ×2 IMPLANT
COVER SURGICAL LIGHT HANDLE (MISCELLANEOUS) ×2 IMPLANT
DERMABOND ADVANCED .7 DNX12 (GAUZE/BANDAGES/DRESSINGS) ×2 IMPLANT
DRAPE C-ARM 42X120 X-RAY (DRAPES) IMPLANT
ELECT REM PT RETURN 15FT ADLT (MISCELLANEOUS) ×2 IMPLANT
ENDOLOOP SUT PDS II 0 18 (SUTURE) ×2 IMPLANT
GLOVE BIO SURGEON STRL SZ7.5 (GLOVE) ×2 IMPLANT
GLOVE INDICATOR 8.0 STRL GRN (GLOVE) ×2 IMPLANT
GOWN STRL REUS W/ TWL XL LVL3 (GOWN DISPOSABLE) ×4 IMPLANT
GRASPER SUT TROCAR 14GX15 (MISCELLANEOUS) IMPLANT
HEMOSTAT SNOW SURGICEL 2X4 (HEMOSTASIS) IMPLANT
IRRIG SUCT STRYKERFLOW 2 WTIP (MISCELLANEOUS) ×1
IRRIGATION SUCT STRKRFLW 2 WTP (MISCELLANEOUS) ×2 IMPLANT
IV CATH 14GX2 1/4 (CATHETERS) ×2 IMPLANT
KIT BASIN OR (CUSTOM PROCEDURE TRAY) ×2 IMPLANT
KIT TURNOVER KIT A (KITS) IMPLANT
NDL INSUFFLATION 14GA 120MM (NEEDLE) ×2 IMPLANT
NEEDLE INSUFFLATION 14GA 120MM (NEEDLE) ×1 IMPLANT
POUCH RETRIEVAL ECOSAC 10 (ENDOMECHANICALS) ×2 IMPLANT
SCISSORS LAP 5X35 DISP (ENDOMECHANICALS) ×2 IMPLANT
SET TUBE SMOKE EVAC HIGH FLOW (TUBING) ×2 IMPLANT
SLEEVE Z-THREAD 5X100MM (TROCAR) ×4 IMPLANT
SPIKE FLUID TRANSFER (MISCELLANEOUS) ×2 IMPLANT
STOPCOCK 4 WAY LG BORE MALE ST (IV SETS) IMPLANT
SUT MNCRL AB 4-0 PS2 18 (SUTURE) ×2 IMPLANT
SUT NOVA NAB GS-21 0 18 T12 DT (SUTURE) IMPLANT
TOWEL OR 17X26 10 PK STRL BLUE (TOWEL DISPOSABLE) ×2 IMPLANT
TRAY LAPAROSCOPIC (CUSTOM PROCEDURE TRAY) ×2 IMPLANT
TROCAR ADV FIXATION 12X100MM (TROCAR) ×2 IMPLANT
TROCAR Z-THREAD OPTICAL 5X100M (TROCAR) ×2 IMPLANT

## 2023-05-18 NOTE — Care Management Obs Status (Signed)
MEDICARE OBSERVATION STATUS NOTIFICATION   Patient Details  Name: [name] [name] MRN: [id number] Date of Birth: 08/05/1980   Medicare Observation Status Notification Given:       Howell Rucks, RN 05/18/2023, 12:01 PM

## 2023-05-18 NOTE — Consult Note (Signed)
Consult Note  ROJELIO UHRICH 14-Oct-1980  [id number].    Requesting MD: Teena Dunk, MD Chief Complaint/Reason for Consult: possible cholecystitis HPI: [name] [name] is a 43 year old male who presented to the ED with 2 day history of abdominal pain and nausea. Pt reports severe epigastric abdominal pain with radiation to RUQ with associated fever, n/v and yellow stools. Pain worse with po intake. Denies chest pain, SOB, diarrhea or urinary symptoms. Denies having similar previous episodes of pain.   PMH otherwise significant for Bechet's disease, hx of cardiac arrest in 2019, bipolar disorder, OSA, HTN, Depression/anxiety, seizures and presence of a loop recorded . Prior abdominal surgery includes appendectomy, surgery for congenital hernia, nissen fundoplication, and robotic ventral hernia repair with mesh. He is not on any blood thinners. Is currently on Wegovy - started 3 months ago, last dose Fri. Denies alcohol, tobacco or illicit drug use.   ROS: Negative other than HPI  Family History  Problem Relation Age of Onset   Lung cancer Paternal Grandfather    Other Father        BPPV    Past Medical History:  Diagnosis Date   Anxiety    Behcet's disease (HCC)    Bipolar disorder (HCC)    Cardiac arrest (HCC) 12/25/2017   one round of CPR    Depression    Hypertension    Migraine    Seizures (HCC)    last one 2017   Sleep apnea    CPAP at night   Vitamin D deficiency     Past Surgical History:  Procedure Laterality Date   APPENDECTOMY     CYSTOSCOPY WITH URETHRAL DILATATION N/A 10/15/2019   Procedure: CYSTOSCOPY WITH URETHRAL DILATATION WITH BALLOON;  Surgeon: Crista Elliot, MD;  Location: [facility];  Service: Urology;  Laterality: N/A;   FETAL SURGERY FOR CONGENITAL HERNIA     x 2   FRACTURE SURGERY Right    Arm   LEFT HEART CATH AND CORONARY ANGIOGRAPHY N/A 11/29/2018   Procedure: LEFT HEART CATH AND CORONARY ANGIOGRAPHY;  Surgeon: Swaziland, Peter M, MD;   Location: [facility] INVASIVE CV LAB;  Service: Cardiovascular;  Laterality: N/A;   maxilofacial     NISSEN FUNDOPLICATION     RADIOLOGY WITH ANESTHESIA N/A 02/13/2019   Procedure: MRI WITH ANESTHESIA OF BRAIN WITH AND WITHOUT CONTRAST;  Surgeon: Radiologist, Medication, MD;  Location: [facility] OR;  Service: Radiology;  Laterality: N/A;   XI ROBOTIC ASSISTED VENTRAL HERNIA N/A 01/13/2022   Procedure: ROBOTIC RECURRENT VENTRAL HERNIA REPAIR WITH MESH;  Surgeon: Stechschulte, Hyman Hopes, MD;  Location: [facility];  Service: General;  Laterality: N/A;    Social History:  reports that he has never smoked. He has never used smokeless tobacco. He reports that he does not drink alcohol and does not use drugs.  Allergies: No Known Allergies  Facility-Administered Medications Prior to Admission  Medication Dose Route Frequency Provider Last Rate Last Admin   lidocaine-EPINEPHrine (XYLOCAINE W/EPI) 1 %-1:100000 (with pres) injection 15 mL  15 mL Infiltration Once Croitoru, Mihai, MD       Medications Prior to Admission  Medication Sig Dispense Refill   Apremilast 30 MG TABS Take 30 mg by mouth in the morning and at bedtime.     atorvastatin (LIPITOR) 80 MG tablet TAKE 1 TABLET(80 MG) BY MOUTH DAILY 90 tablet 3   clonazePAM (KLONOPIN) 0.5 MG tablet Take 0.25 mg by mouth in the morning, at noon, and  at bedtime.     FLUoxetine (PROZAC) 40 MG capsule Take 40 mg by mouth 2 (two) times daily.     lamoTRIgine 200 MG TBDP DISSOLVE 1 TABLET BY MOUTH EVERY MORNING AND EVERY NIGHT AT BEDTIME 180 tablet 3   levETIRAcetam (KEPPRA) 750 MG tablet TAKE 1 TABLET(750 MG) BY MOUTH TWICE DAILY 180 tablet 3   melatonin 5 MG TABS Take 5 mg by mouth at bedtime.     mycophenolate (CELLCEPT) 500 MG tablet Take 500 mg by mouth 2 (two) times daily.  2   QUEtiapine (SEROQUEL) 200 MG tablet Take 200 mg by mouth at bedtime.     WEGOVY 0.5 MG/0.5ML SOAJ Inject 0.5 mg into the skin once a week.      Blood pressure (!) 140/95, pulse 75, temperature  97.9 F (36.6 C), temperature source Oral, resp. rate 16, SpO2 100%. Physical Exam:  General: pleasant, WD,  male who is laying in bed in NAD HEENT: head is normocephalic, atraumatic.  Sclera are non-icteric  Heart: regular, rate, and rhythm.  Lungs: CTAB, no wheezes, rhonchi, or rales noted.  Respiratory effort nonlabored Abd: Soft, ND, epigastric and ruq ttp with equivocal murphy's sign. +BS. Prio open epigastric and RLQ scars noted and well healed. Laparoscopic scars noted and well healed.  MS: no LE edema Skin: warm and dry with Psych: A&Ox3 with an appropriate affect  Results for orders placed or performed during the hospital encounter of 05/18/23 (from the past 48 hours)  Lipase, blood     Status: None   Collection Time: 05/17/23  9:04 PM  Result Value Ref Range   Lipase 35 11 - 51 U/L    Comment: Performed at [facility], [address], [place], [place] [address]  Comprehensive metabolic panel     Status: Abnormal   Collection Time: 05/17/23  9:04 PM  Result Value Ref Range   Sodium 139 135 - 145 mmol/L   Potassium 3.6 3.5 - 5.1 mmol/L   Chloride 106 98 - 111 mmol/L   CO2 22 22 - 32 mmol/L   Glucose, Bld 99 70 - 99 mg/dL    Comment: Glucose reference range applies only to samples taken after fasting for at least 8 hours.   BUN 19 6 - 20 mg/dL   Creatinine, Ser 2.95 0.61 - 1.24 mg/dL   Calcium 9.6 8.9 - 62.1 mg/dL   Total Protein 7.1 6.5 - 8.1 g/dL   Albumin 4.4 3.5 - 5.0 g/dL   AST 14 (L) 15 - 41 U/L   ALT 22 0 - 44 U/L   Alkaline Phosphatase 80 38 - 126 U/L   Total Bilirubin 0.3 0.0 - 1.2 mg/dL   GFR, Estimated >30 >86 mL/min    Comment: (NOTE) Calculated using the CKD-EPI Creatinine Equation (2021)    Anion gap 11 5 - 15    Comment: Performed at [facility], [address], [place], [place] [address]  CBC     Status: None   Collection Time: 05/17/23  9:04 PM  Result Value Ref Range   WBC 8.7 4.0 - 10.5 K/uL   RBC 4.92  4.22 - 5.81 MIL/uL   Hemoglobin 15.1 13.0 - 17.0 g/dL   HCT 96.2 95.2 - 84.1 %   MCV 91.3 80.0 - 100.0 fL   MCH 30.7 26.0 - 34.0 pg   MCHC 33.6 30.0 - 36.0 g/dL   RDW 32.4 40.1 - 02.7 %   Platelets 189 150 - 400 K/uL  nRBC 0.0 0.0 - 0.2 %    Comment: Performed at [facility], [address] [place], [place] [address]  Urinalysis, Routine w reflex microscopic -Urine, Clean Catch     Status: Abnormal   Collection Time: 05/17/23  9:04 PM  Result Value Ref Range   Color, Urine YELLOW YELLOW   APPearance CLEAR CLEAR   Specific Gravity, Urine 1.045 (H) 1.005 - 1.030   pH 6.0 5.0 - 8.0   Glucose, UA NEGATIVE NEGATIVE mg/dL   Hgb urine dipstick NEGATIVE NEGATIVE   Bilirubin Urine NEGATIVE NEGATIVE   Ketones, ur TRACE (A) NEGATIVE mg/dL   Protein, ur TRACE (A) NEGATIVE mg/dL   Nitrite NEGATIVE NEGATIVE   Leukocytes,Ua NEGATIVE NEGATIVE    Comment: Performed at [facility], [address] [place], [place] [address]  Troponin I (High Sensitivity)     Status: None   Collection Time: 05/18/23  1:59 AM  Result Value Ref Range   Troponin I (High Sensitivity) 3 <18 ng/L    Comment: (NOTE) Elevated high sensitivity troponin I (hsTnI) values and significant  changes across serial measurements may suggest ACS but many other  chronic and acute conditions are known to elevate hsTnI results.  Refer to the "Links" section for chest pain algorithms and additional  guidance. Performed at [facility], [address] [place], [place] [address]   Troponin I (High Sensitivity)     Status: None   Collection Time: 05/18/23  3:43 AM  Result Value Ref Range   Troponin I (High Sensitivity) 3 <18 ng/L    Comment: (NOTE) Elevated high sensitivity troponin I (hsTnI) values and significant  changes across serial measurements may suggest ACS but many other  chronic and acute conditions are known to elevate hsTnI results.  Refer to the "Links"  section for chest pain algorithms and additional  guidance. Performed at [facility], [address] [place], [place] [address]    CT ABDOMEN PELVIS W CONTRAST Result Date: 05/18/2023 CLINICAL DATA:  Cholelithiasis. Abdominal pain with nausea, vomiting, diarrhea. Right upper quadrant pain. EXAM: CT ABDOMEN AND PELVIS WITH CONTRAST TECHNIQUE: Multidetector CT imaging of the abdomen and pelvis was performed using the standard protocol following bolus administration of intravenous contrast. RADIATION DOSE REDUCTION: This exam was performed according to the departmental dose-optimization program which includes automated exposure control, adjustment of the mA and/or kV according to patient size and/or use of iterative reconstruction technique. CONTRAST:  OMNIPAQUE IOHEXOL 300 MG/ML  SOLN COMPARISON:  Ultrasound 05/18/2023 CT abdomen and pelvis 03/03/2023 FINDINGS: Lower chest: No acute abnormality. Hepatobiliary: Unremarkable liver. Normal gallbladder. No radiopaque stone. No biliary dilation. Pancreas: Unremarkable. Spleen: Unremarkable. Adrenals/Urinary Tract: Normal adrenal glands. No urinary calculi or hydronephrosis. Bladder is unremarkable. Stomach/Bowel: Normal caliber large and small bowel. No bowel wall thickening. The appendix is not visualized.Stomach is within normal limits. Vascular/Lymphatic: No significant vascular findings are present. No enlarged abdominal or pelvic lymph nodes. Reproductive: Unremarkable. Other: No free intraperitoneal fluid or air. Musculoskeletal: No acute fracture. IMPRESSION: No acute abnormality in the abdomen or pelvis. Electronically Signed   By: Minerva Fester M.D.   On: 05/18/2023 02:41   US Abdomen Limited RUQ (LIVER/GB) Result Date: 05/18/2023 CLINICAL DATA:  Right upper quadrant pain EXAM: ULTRASOUND ABDOMEN LIMITED RIGHT UPPER QUADRANT COMPARISON:  CT 03/03/2023 FINDINGS: Gallbladder: Questionable sludge in the gallbladder. The  gallbladder wall is thickened measuring 5 mm. Positive sonographic Murphy's sign noted by the sonographer. Common bile duct: Diameter: 6 mm.  No intrahepatic biliary dilation. Liver: No focal lesion identified. Increased parenchymal echogenicity. Portal vein is patent on color Doppler imaging with normal direction of blood flow towards the liver. Other: Limited exam due to overlying bowel gas and body habitus. Limited deep breath hold due to pain. IMPRESSION: 1. Findings suspicious for acute cholecystitis. 2. Hepatic steatosis. Electronically Signed   By: Minerva Fester M.D.   On: 05/18/2023 00:34      Assessment/Plan Cholecystitis  - [place] with questionable sludge in gallbladder and mild gallbladder wall thickening and positive sonographic murphy sign - CT with no acute abdominal process - No leukocytosis and non-elevated LFTs. Lipase wnl - Abdominal exam and hx consistent with acute cholecystitis  - Recommend laparoscopic cholecystectomy, cardiology has seen and feel that RCRI shows mildly elevated risk but does not need any further workup prior to surgical intervention, recommend tele monitoring post-procedure.  I have explained the procedure, risks, and aftercare of Laparoscopic cholecystectomy with possible IOC.  Risks include but are not limited to anesthesia (MI, CVA, death, prolonged intubation and aspiration), bleeding, infection, wound problems, hernia, bile leak, injury to common bile duct/liver/intestine, possible need for subtotal cholecystectomy or open cholecystectomy, increased risk of DVT/PE and diarrhea post op.  He seems to understand and agrees to proceed.  FEN: NPO, IVF per [facility] VTE: LMWH ID: rocephin/flagyl (does not need flagyl from our standpoint)  - per [facility] -  Bechet's disease Hx of cardiac arrest HTN OSA Bipolar disorder Depression/Anxiety Seizures   I reviewed ED provider notes, Consultant cardiology notes, hospitalist notes, last 24 h vitals and pain scores, last  48 h intake and output, last 24 h labs and trends, and last 24 h imaging results.  This care required high  level of medical decision making.   Leary Roca, [facility] 05/18/2023, 9:29 AM Please see Amion for pager number during day hours 7:00am-4:30pm

## 2023-05-18 NOTE — ED Provider Notes (Signed)
EMERGENCY DEPARTMENT AT [facility] Provider Note   CSN: [id number] Arrival date & time: 05/17/23  2034     History  Chief Complaint  Patient presents with   Abdominal Pain    [name] [name] is a 43 y.o. male.  Patient with a history of Behcet's disease, cardiac arrest, hypertension, seizures, migraine headaches presenting with upper abdominal pain, nausea, vomiting and discolored stools.  States this felt ill for the past 2 days with right upper quadrant pain that is worse with eating and palpation.  Several episodes of nausea and vomiting and has noticed a yellow color to his stool which is formed.  No fever.  No chest pain or shortness of breath.  No pain with urination or blood in the urine.  History of Nissen fundoplication in the past.  Previous appendectomy.  Still has gallbladder.  Denies history of ulcers or acid reflux.  The history is provided by the patient.  Abdominal Pain Associated symptoms: nausea and vomiting   Associated symptoms: no chest pain, no cough, no dysuria, no fever and no shortness of breath        Home Medications Prior to Admission medications   Medication Sig Start Date End Date Taking? Authorizing Provider  Apremilast 30 MG TABS Take 30 mg by mouth in the morning and at bedtime.    [provider]  atorvastatin (LIPITOR) 80 MG tablet TAKE 1 TABLET(80 MG) BY MOUTH DAILY 01/31/23   Croitoru, Mihai, MD  azelastine (OPTIVAR) 0.05 % ophthalmic solution Place 2 drops into both eyes 2 (two) times daily. Patient not taking: Reported on 03/06/2023 12/08/22   [provider]  clonazePAM (KLONOPIN) 0.25 MG disintegrating tablet Take 0.25 mg by mouth 3 (three) times daily. 01/05/22   [provider]  FLUoxetine (PROZAC) 40 MG capsule Take 40 mg by mouth 2 (two) times daily.    [provider]  lamoTRIgine 200 MG TBDP DISSOLVE 1 TABLET BY MOUTH EVERY MORNING AND EVERY NIGHT AT BEDTIME 11/30/22   Windell Norfolk,  MD  levETIRAcetam (KEPPRA) 750 MG tablet TAKE 1 TABLET(750 MG) BY MOUTH TWICE DAILY 10/31/22   Camara, Amadou, MD  melatonin 5 MG TABS Take 5 mg by mouth at bedtime.    [provider]  montelukast (SINGULAIR) 10 MG tablet Take 10 mg by mouth daily. Patient not taking: Reported on 03/06/2023    [provider]  mycophenolate (CELLCEPT) 500 MG tablet Take 1,000 mg by mouth 2 (two) times daily. 03/20/17   [provider]  QUEtiapine (SEROQUEL) 200 MG tablet Take 200 mg by mouth at bedtime.    [provider]      Allergies    Patient has no known allergies.    Review of Systems   Review of Systems  Constitutional:  Positive for activity change and appetite change. Negative for fever.  HENT:  Negative for congestion and rhinorrhea.   Respiratory:  Negative for cough, chest tightness and shortness of breath.   Cardiovascular:  Negative for chest pain.  Gastrointestinal:  Positive for abdominal pain, nausea and vomiting.  Genitourinary:  Negative for dysuria.  Musculoskeletal:  Negative for arthralgias and myalgias.  Skin:  Negative for rash.  Neurological:  Negative for dizziness, weakness and headaches.   all other systems are negative except as noted in the HPI and PMH.    Physical Exam Updated Vital Signs BP 135/87 (BP Location: Right Arm)   Pulse 98   Temp 97.9 F (36.6 C)  Resp 18   SpO2 99%  Physical Exam Vitals and nursing note reviewed.  Constitutional:      General: He is not in acute distress.    Appearance: He is well-developed.  HENT:     Head: Normocephalic and atraumatic.     Mouth/Throat:     Pharynx: No oropharyngeal exudate.  Eyes:     Conjunctiva/sclera: Conjunctivae normal.     Pupils: Pupils are equal, round, and reactive to light.  Neck:     Comments: No meningismus. Cardiovascular:     Rate and Rhythm: Normal rate and regular rhythm.     Heart sounds: Normal heart sounds. No murmur heard. Pulmonary:     Effort:  Pulmonary effort is normal. No respiratory distress.     Breath sounds: Normal breath sounds.  Abdominal:     Palpations: Abdomen is soft.     Tenderness: There is abdominal tenderness. There is guarding. There is no rebound.     Comments: Tender right upper quadrant voluntary guarding.  No rebound  Musculoskeletal:        General: No tenderness. Normal range of motion.     Cervical back: Normal range of motion and neck supple.  Skin:    General: Skin is warm.  Neurological:     Mental Status: He is alert and oriented to person, place, and time.     Cranial Nerves: No cranial nerve deficit.     Motor: No abnormal muscle tone.     Coordination: Coordination normal.     Comments:  5/5 strength throughout. CN 2-12 intact.Equal grip strength.   Psychiatric:        Behavior: Behavior normal.     ED Results / Procedures / Treatments   Labs (all labs ordered are listed, but only abnormal results are displayed) Labs Reviewed  COMPREHENSIVE METABOLIC PANEL - Abnormal; Notable for the following components:      Result Value   AST 14 (*)    All other components within normal limits  URINALYSIS, ROUTINE W REFLEX MICROSCOPIC - Abnormal; Notable for the following components:   Specific Gravity, Urine 1.045 (*)    Ketones, ur TRACE (*)    Protein, ur TRACE (*)    All other components within normal limits  LIPASE, BLOOD  CBC  TROPONIN I (HIGH SENSITIVITY)  TROPONIN I (HIGH SENSITIVITY)    EKG None  Radiology CT ABDOMEN PELVIS W CONTRAST Result Date: 05/18/2023 CLINICAL DATA:  Cholelithiasis. Abdominal pain with nausea, vomiting, diarrhea. Right upper quadrant pain. EXAM: CT ABDOMEN AND PELVIS WITH CONTRAST TECHNIQUE: Multidetector CT imaging of the abdomen and pelvis was performed using the standard protocol following bolus administration of intravenous contrast. RADIATION DOSE REDUCTION: This exam was performed according to the departmental dose-optimization program which includes  automated exposure control, adjustment of the mA and/or kV according to patient size and/or use of iterative reconstruction technique. CONTRAST:  OMNIPAQUE IOHEXOL 300 MG/ML  SOLN COMPARISON:  Ultrasound 05/18/2023 CT abdomen and pelvis 03/03/2023 FINDINGS: Lower chest: No acute abnormality. Hepatobiliary: Unremarkable liver. Normal gallbladder. No radiopaque stone. No biliary dilation. Pancreas: Unremarkable. Spleen: Unremarkable. Adrenals/Urinary Tract: Normal adrenal glands. No urinary calculi or hydronephrosis. Bladder is unremarkable. Stomach/Bowel: Normal caliber large and small bowel. No bowel wall thickening. The appendix is not visualized.Stomach is within normal limits. Vascular/Lymphatic: No significant vascular findings are present. No enlarged abdominal or pelvic lymph nodes. Reproductive: Unremarkable. Other: No free intraperitoneal fluid or air. Musculoskeletal: No acute fracture. IMPRESSION: No acute abnormality in the abdomen  or pelvis. Electronically Signed   By: Minerva Fester M.D.   On: 05/18/2023 02:41   US Abdomen Limited RUQ (LIVER/GB) Result Date: 05/18/2023 CLINICAL DATA:  Right upper quadrant pain EXAM: ULTRASOUND ABDOMEN LIMITED RIGHT UPPER QUADRANT COMPARISON:  CT 03/03/2023 FINDINGS: Gallbladder: Questionable sludge in the gallbladder. The gallbladder wall is thickened measuring 5 mm. Positive sonographic Murphy's sign noted by the sonographer. Common bile duct: Diameter: 6 mm.  No intrahepatic biliary dilation. Liver: No focal lesion identified. Increased parenchymal echogenicity. Portal vein is patent on color Doppler imaging with normal direction of blood flow towards the liver. Other: Limited exam due to overlying bowel gas and body habitus. Limited deep breath hold due to pain. IMPRESSION: 1. Findings suspicious for acute cholecystitis. 2. Hepatic steatosis. Electronically Signed   By: Minerva Fester M.D.   On: 05/18/2023 00:34    Procedures Procedures     Medications Ordered in ED Medications  lactated ringers bolus 1,000 mL (has no administration in time range)  fentaNYL (SUBLIMAZE) injection 50 mcg (has no administration in time range)  ondansetron (ZOFRAN) injection 4 mg (has no administration in time range)  cefTRIAXone (ROCEPHIN) 2 g in sodium chloride 0.9 % 100 mL IVPB (has no administration in time range)    ED Course/ Medical Decision Making/ A&P                                 Medical Decision Making Amount and/or Complexity of Data Reviewed Labs: ordered. Decision-making details documented in ED Course. Radiology: ordered and independent interpretation performed. Decision-making details documented in ED Course. ECG/medicine tests: ordered and independent interpretation performed. Decision-making details documented in ED Course.  Risk Prescription drug management. Decision regarding hospitalization.   Right upper quadrant pain with nausea and vomiting x 2 days.  Stable vitals.  Abdomen tender in the right upper quadrant without peritoneal signs.  Labs show normal LFTs and lipase.  No leukocytosis.  Bilirubin is normal.  [place] Positive for cholecystitis. Started on IV fluids and IV antibiotics.  Discussed with Dr. [name] of general surgery.  Given his medical conditions and previous cardiac arrest he recommends medical admission and will consult.  CT scan shows no additional pathological findings.  No bowel obstruction.  Admission discussed with Dr. Toniann Fail.  Will need medical clearance prior to any surgery per Dr. [name].  ED ECG REPORT   Date: 05/18/2023  Rate: 82  Rhythm: normal sinus rhythm  QRS Axis: normal  Intervals: normal  ST/T Wave abnormalities: nonspecific ST/T changes  Conduction Disutrbances:none  Narrative Interpretation: Unchanged ST depressions laterally and inferiorly.  Old EKG Reviewed: unchanged  I have personally reviewed the EKG tracing and agree with the computerized printout as  noted.         Final Clinical Impression(s) / ED Diagnoses Final diagnoses:  Cholecystitis    Rx / DC Orders ED Discharge Orders     None         Vincy Feliz, Jeannett Senior, MD 05/18/23 [phone]

## 2023-05-18 NOTE — ED Notes (Signed)
Patient back from CT.

## 2023-05-18 NOTE — Anesthesia Procedure Notes (Signed)
Procedure Name: Intubation Date/Time: 05/18/2023 2:56 PM  Performed by: Micki Riley, CRNAPre-anesthesia Checklist: Emergency Drugs available, Patient identified, Suction available, Patient being monitored and Timeout performed Patient Re-evaluated:Patient Re-evaluated prior to induction Oxygen Delivery Method: Circle system utilized Preoxygenation: Pre-oxygenation with 100% oxygen Induction Type: IV induction Laryngoscope Size: McGrath and 3 Tube size: 7.5 mm Number of attempts: 1 Secured at: 23 cm Tube secured with: Tape Dental Injury: Teeth and Oropharynx as per pre-operative assessment

## 2023-05-18 NOTE — Consult Note (Addendum)
Cardiology Consultation   Patient ID: [name] [name] MRN: [id number]; DOB: 1980-04-14  Admit date: 05/18/2023 Date of Consult: 05/18/2023  PCP:  Soundra Pilon, FNP   [facility] Providers Cardiologist:  Thurmon Fair, MD  Electrophysiologist:  Thurmon Fair, MD  {  Patient Profile:   [name] [name] is a 43 y.o. male with a hx of Behcet's, cardiac arrest 2019, status post loop recorder 2021, seizures, OSA on CPAP who is being seen 05/18/2023 for the evaluation of preoperative evaluation with cholecystitis at the request of Dr. Kirby Crigler.  History of Present Illness:   [name] in 2019 had an episode of presumed cardiac arrest.   He really has no recollection of what happened but he reportedly was climbing out of the pool and became unconscious, lifeguard had performed 1 round of CPR and obtained ROSC.  No shocks.  EMS got there and he had a pulse but still unresponsive so he was intubated, never needed vasopressors.  Negative troponins.  No prolonged QT interval.  Overall workup has been unremarkable with normal echocardiogram, stress test, left heart cath with normal coronary anatomy in 2020.  Additionally had a CAC score of 0 2021.  He has a loop recorder that has never detected any arrhythmias.  He has done extremely well throughout the years with absolutely 0 symptoms seem I just seen last in the office in December 2024, no complaints.  He was seen recently the month before November with atypical chest pain that seems more likely to be musculoskeletal pain.  Today the patient is being evaluated for possible cholecystitis, cardiology asked to see for preoperative evaluation.  Overall patient has reported no symptoms of chest pain, palpitations, dizziness, syncope, lethargy, fatigue, shortness of breath, dyspnea, orthopnea.  He ambulates daily for at least 10 to 20 minutes with no associated symptoms.  Denies any leg swelling or palpitations.  Overall completely  asymptomatic.  Overall completely normal lab work.  Normal renal function electrolytes, liver enzymes.  No white count, hemoglobin 15.1.  Ultrasound had picked up acute cholecystitis.     Past Medical History:  Diagnosis Date   Anxiety    Behcet's disease (HCC)    Bipolar disorder (HCC)    Cardiac arrest (HCC) 12/25/2017   one round of CPR    Depression    Hypertension    Migraine    Seizures (HCC)    last one 2017   Sleep apnea    CPAP at night   Vitamin D deficiency     Past Surgical History:  Procedure Laterality Date   APPENDECTOMY     CYSTOSCOPY WITH URETHRAL DILATATION N/A 10/15/2019   Procedure: CYSTOSCOPY WITH URETHRAL DILATATION WITH BALLOON;  Surgeon: Crista Elliot, MD;  Location: [facility];  Service: Urology;  Laterality: N/A;   FETAL SURGERY FOR CONGENITAL HERNIA     x 2   FRACTURE SURGERY Right    Arm   LEFT HEART CATH AND CORONARY ANGIOGRAPHY N/A 11/29/2018   Procedure: LEFT HEART CATH AND CORONARY ANGIOGRAPHY;  Surgeon: Swaziland, Peter M, MD;  Location: [facility] INVASIVE CV LAB;  Service: Cardiovascular;  Laterality: N/A;   maxilofacial     NISSEN FUNDOPLICATION     RADIOLOGY WITH ANESTHESIA N/A 02/13/2019   Procedure: MRI WITH ANESTHESIA OF BRAIN WITH AND WITHOUT CONTRAST;  Surgeon: Radiologist, Medication, MD;  Location: [facility] OR;  Service: Radiology;  Laterality: N/A;   XI ROBOTIC ASSISTED VENTRAL HERNIA N/A 01/13/2022   Procedure: ROBOTIC RECURRENT VENTRAL  HERNIA REPAIR WITH MESH;  Surgeon: Stechschulte, Hyman Hopes, MD;  Location: [facility];  Service: General;  Laterality: N/A;     Inpatient Medications: Scheduled Meds:  atorvastatin  80 mg Oral Daily   enoxaparin (LOVENOX) injection  40 mg Subcutaneous Q24H   FLUoxetine  40 mg Oral BID   QUEtiapine  200 mg Oral QHS   Continuous Infusions:  [START ON 05/19/2023] cefTRIAXone (ROCEPHIN)  IV     metronidazole 500 mg (05/18/23 0645)   PRN Meds: acetaminophen **OR** acetaminophen, albuterol, HYDROmorphone  (DILAUDID) injection, ondansetron (ZOFRAN) IV, oxyCODONE  Allergies:   No Known Allergies  Social History:   Social History   Socioeconomic History   Marital status: Single    Spouse name: Not on file   Number of children: 0   Years of education: 16   Highest education level: Not on file  Occupational History    Employer: DISABLED  Tobacco Use   Smoking status: Never   Smokeless tobacco: Never  Vaping Use   Vaping status: Never Used  Substance and Sexual Activity   Alcohol use: No   Drug use: No   Sexual activity: Not Currently  Other Topics Concern   Not on file  Social History Narrative   Lives with mom    Right Handed   Drinks >2 cups caffeine   Social Drivers of Health   Financial Resource Strain: Not on file  Food Insecurity: No Food Insecurity (05/18/2023)   Hunger Vital Sign    Worried About Running Out of Food in the Last Year: Never true    Ran Out of Food in the Last Year: Never true  Transportation Needs: No Transportation Needs (05/18/2023)   PRAPARE - Administrator, Civil Service (Medical): No    Lack of Transportation (Non-Medical): No  Physical Activity: Not on file  Stress: Not on file  Social Connections: Not on file  Intimate Partner Violence: Not At Risk (05/18/2023)   Humiliation, Afraid, Rape, and Kick questionnaire    Fear of Current or Ex-Partner: No    Emotionally Abused: No    Physically Abused: No    Sexually Abused: No    Family History:    Family History  Problem Relation Age of Onset   Lung cancer Paternal Grandfather    Other Father        BPPV     ROS:  Please see the history of present illness.  All other ROS reviewed and negative.     Physical Exam/Data:   Vitals:   05/18/23 0315 05/18/23 0344 05/18/23 0500 05/18/23 0849  BP: (!) 133/90 129/88 (!) 137/90 (!) 140/95  Pulse: 83 83 75 75  Resp: (!) 9 18 15 16   Temp:  98 F (36.7 C) (!) 97.5 F (36.4 C) 97.9 F (36.6 C)  TempSrc:   Oral Oral  SpO2: 100%  100% 100% 100%    Intake/Output Summary (Last 24 hours) at 05/18/2023 0855 Last data filed at 05/18/2023 0600 Gross per 24 hour  Intake 1100 ml  Output 0 ml  Net 1100 ml      03/06/2023    1:27 PM 03/03/2023    6:58 PM 01/29/2023   10:37 AM  Last 3 Weights  Weight (lbs) 253 lb 9.6 oz 250 lb 254 lb 9.6 oz  Weight (kg) 115.032 kg 113.399 kg 115.486 kg     There is no height or weight on file to calculate BMI.  General:  Well nourished, well developed, in  no acute distress HEENT: normal Neck: no JVD Vascular: No carotid bruits; Distal pulses 2+ bilaterally Cardiac:  normal S1, S2; RRR; no murmur  Lungs:  clear to auscultation bilaterally, no wheezing, rhonchi or rales  Abd: soft, nontender, no hepatomegaly  Ext: no edema Musculoskeletal:  No deformities, BUE and BLE strength normal and equal Skin: warm and dry  Neuro:  CNs 2-12 intact, no focal abnormalities noted Psych:  Normal affect   EKG:  The EKG was personally reviewed and demonstrates: Sinus rhythm heart rate 86.  Chronic subtle ST depressions Telemetry:  Telemetry was personally reviewed and demonstrates: Not on telemetry  Relevant CV Studies: Coronary calcium score 12/04/2019 Coronary calcium score of 0 Agatston units. This suggests low risk for future cardiac events.   Cardiac catheterization 11/29/2018 The left ventricular systolic function is normal. LV end diastolic pressure is normal. The left ventricular ejection fraction is 55-65% by visual estimate.   1. Normal coronary anatomy 2. Normal LV function 3. Normal LVEDP   Plan: medical Rx.   Laboratory Data:  High Sensitivity Troponin:   Recent Labs  Lab 05/18/23 0159 05/18/23 0343  TROPONINIHS 3 3     Chemistry Recent Labs  Lab 05/17/23 2104  NA 139  K 3.6  CL 106  CO2 22  GLUCOSE 99  BUN 19  CREATININE 1.06  CALCIUM 9.6  GFRNONAA >60  ANIONGAP 11    Recent Labs  Lab 05/17/23 2104  PROT 7.1  ALBUMIN 4.4  AST 14*  ALT 22   ALKPHOS 80  BILITOT 0.3   Lipids No results for input(s): "CHOL", "TRIG", "HDL", "LABVLDL", "LDLCALC", "CHOLHDL" in the last 168 hours.  Hematology Recent Labs  Lab 05/17/23 2104  WBC 8.7  RBC 4.92  HGB 15.1  HCT 44.9  MCV 91.3  MCH 30.7  MCHC 33.6  RDW 12.1  PLT 189   Thyroid No results for input(s): "TSH", "FREET4" in the last 168 hours.  BNPNo results for input(s): "BNP", "PROBNP" in the last 168 hours.  DDimer No results for input(s): "DDIMER" in the last 168 hours.   Radiology/Studies:  CT ABDOMEN PELVIS W CONTRAST Result Date: 05/18/2023 CLINICAL DATA:  Cholelithiasis. Abdominal pain with nausea, vomiting, diarrhea. Right upper quadrant pain. EXAM: CT ABDOMEN AND PELVIS WITH CONTRAST TECHNIQUE: Multidetector CT imaging of the abdomen and pelvis was performed using the standard protocol following bolus administration of intravenous contrast. RADIATION DOSE REDUCTION: This exam was performed according to the departmental dose-optimization program which includes automated exposure control, adjustment of the mA and/or kV according to patient size and/or use of iterative reconstruction technique. CONTRAST:  OMNIPAQUE IOHEXOL 300 MG/ML  SOLN COMPARISON:  Ultrasound 05/18/2023 CT abdomen and pelvis 03/03/2023 FINDINGS: Lower chest: No acute abnormality. Hepatobiliary: Unremarkable liver. Normal gallbladder. No radiopaque stone. No biliary dilation. Pancreas: Unremarkable. Spleen: Unremarkable. Adrenals/Urinary Tract: Normal adrenal glands. No urinary calculi or hydronephrosis. Bladder is unremarkable. Stomach/Bowel: Normal caliber large and small bowel. No bowel wall thickening. The appendix is not visualized.Stomach is within normal limits. Vascular/Lymphatic: No significant vascular findings are present. No enlarged abdominal or pelvic lymph nodes. Reproductive: Unremarkable. Other: No free intraperitoneal fluid or air. Musculoskeletal: No acute fracture. IMPRESSION: No acute  abnormality in the abdomen or pelvis. Electronically Signed   By: Minerva Fester M.D.   On: 05/18/2023 02:41   US Abdomen Limited RUQ (LIVER/GB) Result Date: 05/18/2023 CLINICAL DATA:  Right upper quadrant pain EXAM: ULTRASOUND ABDOMEN LIMITED RIGHT UPPER QUADRANT COMPARISON:  CT 03/03/2023 FINDINGS: Gallbladder: Questionable  sludge in the gallbladder. The gallbladder wall is thickened measuring 5 mm. Positive sonographic Murphy's sign noted by the sonographer. Common bile duct: Diameter: 6 mm.  No intrahepatic biliary dilation. Liver: No focal lesion identified. Increased parenchymal echogenicity. Portal vein is patent on color Doppler imaging with normal direction of blood flow towards the liver. Other: Limited exam due to overlying bowel gas and body habitus. Limited deep breath hold due to pain. IMPRESSION: 1. Findings suspicious for acute cholecystitis. 2. Hepatic steatosis. Electronically Signed   By: Minerva Fester M.D.   On: 05/18/2023 00:34     Assessment and Plan:   Preoperative evaluation for cholecystitis Patient has history of presumed cardiac arrest in 2019 with unremarkable workup with normal heart catheterization in 2020, CAC score of 0 in 2021, no arrhythmias noted on loop recorder (last checked in January).  Does not demonstrate any signs or symptoms to suggest CHF or CAD.  Last echo was in 2021 that showed normal biventricular function, in the absence of symptoms do not feel this needs to be checked right now.  Can consider outpatient though.  Likely can proceed without any further testing.  His RCRI shows mildly elevated risk that is inherent due to procedure. Would put him on tele post procedure though.   Risk Assessment/Risk Scores:   For questions or updates, please contact [facility] Please consult [email or web address] for contact info under    Signed, Abagail Kitchens, PA-C  05/18/2023 8:55 AM   Patient seen and examined, note reviewed with the signed Advanced  Practice Provider. I personally reviewed laboratory data, imaging studies and relevant notes. I independently examined the patient and formulated the important aspects of the plan. I have personally discussed the plan with the patient and/or family. Comments or changes to the note/plan are indicated below.  Patient seen examined his bedside.  GEN:  Well nourished, well developed in no acute distress HEENT: Mucous membranes moist, good dentition NECK: No JVD; No carotid bruits LYMPHATICS: No lymphadenopathy CARDIAC: S1S2 noted, RRR, no murmurs, rubs, gallops RESPIRATORY:  Clear to auscultation without rales, wheezing or rhonchi  ABDOMEN: Soft, non-tender, non-distended, bowel sounds noted, no guarding EXTREMITIES:No cyanosis, no cyanosis, no clubbing MUSCULOSKELETAL: No deformity  SKIN: Warm and dry NEUROLOGIC:  Alert and oriented x 3, nonfocal PSYCHIATRIC:  Normal affect, good insight  Hypertension History of cardiac arrest Preoperative clearance  The patient does not have any unstable cardiac conditions.  Upon evaluation today, he can achieve 4 METs or greater without anginal symptoms.  According to [facility] and AHA guidelines, he requires no further cardiac workup prior to his noncardiac surgery and should be at acceptable risk.  Our service is available as necessary in the perioperative period.  Thomasene Ripple DO, MS [facility] Attending Cardiologist [facility]  [address] [place], [place] [address] [phone] Website: [email or web address]

## 2023-05-18 NOTE — Op Note (Signed)
Patient: [name] [name] ([birth year], [id number])  Date of Surgery: 05/18/2023  Preoperative Diagnosis: ACUTE CHOLECYSTITIS   Postoperative Diagnosis: ACUTE CHOLECYSTITIS   Surgical Procedure: LAPAROSCOPIC CHOLECYSTECTOMY: 66440 (CPT)   Operative Team Members:  Surgeons and Role:    * Aspasia Rude, Hyman Hopes, MD - Primary   Anesthesiologist: Val Eagle, MD CRNA: Micki Riley, CRNA   Anesthesia: General   Fluids:  No intake/output data recorded.  Complications: None  Drains:  none   Specimen:  ID Type Source Tests Collected by Time Destination  1 : Gallbladder Tissue PATH Gallbladder SURGICAL PATHOLOGY Eurydice Calixto, Hyman Hopes, MD 05/18/2023 1537      Disposition:  PACU - hemodynamically stable.  Plan of Care: Admit for overnight observation    Indications for Procedure: MAKENA MCGRADY is a 43 y.o. male who presented with abdominal pain.  History, physical and imaging was concerning for cholecystitis.  Laparoscopic cholecystectomy was recommended for the patient.  The procedure itself, as well as the risks, benefits and alternatives were discussed with the patient.  Risks discussed included but were not limited to the risk of infection, bleeding, damage to nearby structures, need to convert to open procedure, incisional hernia, bile leak, common bile duct injury and the need for additional procedures or surgeries.  With this discussion complete and all questions answered the patient granted consent to proceed.  Findings: cholecystitis  Infection status: Patient: [name] [name] Emergency General Surgery Service Patient Case: Urgent Infection Present At Time Of Surgery (PATOS):  Spillage of bile   Description of Procedure:   On the date stated above, the patient was taken to the operating room suite and placed in supine positioning.  Sequential compression devices were placed on the lower extremities to prevent blood clots.  General endotracheal anesthesia was induced.  Preoperative antibiotics were given.  The patient's abdomen was prepped and draped in the usual sterile fashion.  A time-out was completed verifying the correct patient, procedure, positioning and equipment needed for the case.  We began by anesthetizing the skin with local anesthetic and then making a 5 mm incision just below the umbilicus.  We dissected through the subcutaneous tissues to the fascia.  The fascia was grasped and elevated using a Kocher clamp.  A Veress needle was inserted into the abdomen and the abdomen was insufflated to 15 mmHg.  A 5 mm trocar was inserted in this position under optical guidance and then the abdomen was inspected.  There was no trauma to the underlying viscera with initial trocar placement.  Any abnormal findings, other than inflammation in the right upper quadrant, are listed above in the findings section.  Three additional trocars were placed, one 12 mm trocar in the subxiphoid position, one 5 mm trocar in the midline epigastric area and one 5mm trocar in the right upper quadrant subcostally.  These were placed under direct vision without any trauma to the underlying viscera.    The patient was then placed in head up, left side down positioning.  The gallbladder was identified and dissected free from its attachments to the omentum allowing the duodenum to fall away.  The infundibulum of the gallbladder was dissected free working laterally to medially.  The cystic duct and cystic artery were dissected free from surrounding connective tissue.  The infundibulum of the gallbladder was dissected off the cystic plate.  A critical view of safety was obtained with the cystic duct and cystic artery being cleared of connective tissues and clearly the only two structures entering  into the gallbladder with the liver clearly visible behind.  Clips were then applied to the cystic duct and cystic artery and then these structures were divided.  Two PDS endoloops were placed on the cystic  duct stump. The gallbladder was dissected off the cystic plate, placed in an endocatch bag and removed from the 12 mm subxiphoid port site.  The clips were inspected and appeared effective.  The cystic plate was inspected and hemostasis was obtained using electrocautery.  There was some spillage of bile.  A suction irrigator was used to clean the operative field.  Attention was turned to closure.  The 12 mm subxiphoid port site was closed using a 0-novafil suture on a fascial suture passer as it traversed the previously placed retro muscular mesh..  The abdomen was desufflated.  The skin was closed using 4-0 monocryl and dermabond.  All sponge and needle counts were correct at the conclusion of the case.    Ivar Drape, MD General, Bariatric, & Minimally Invasive Surgery [facility], [place]

## 2023-05-18 NOTE — Plan of Care (Signed)
  Problem: Clinical Measurements: Goal: Diagnostic test results will improve Outcome: Progressing Goal: Respiratory complications will improve Outcome: Progressing Goal: Cardiovascular complication will be avoided Outcome: Progressing   Problem: Elimination: Goal: Will not experience complications related to bowel motility Outcome: Progressing Goal: Will not experience complications related to urinary retention Outcome: Progressing   Problem: Pain Managment: Goal: General experience of comfort will improve and/or be controlled Outcome: Progressing

## 2023-05-18 NOTE — Transfer of Care (Signed)
Immediate Anesthesia Transfer of Care Note  Patient: [name] [name]  Procedure(s) Performed: LAPAROSCOPIC CHOLECYSTECTOMY  Patient Location: PACU  Anesthesia Type:General  Level of Consciousness: awake, alert , and oriented  Airway & Oxygen Therapy: Patient connected to face mask  Post-op Assessment: Report given to RN  Post vital signs: stable  Last Vitals:  Vitals Value Taken Time  BP 156/92 05/18/23 1615  Temp    Pulse 93 05/18/23 1628  Resp 0 05/18/23 1628  SpO2 100 % 05/18/23 1628  Vitals shown include unfiled device data.  Last Pain:  Vitals:   05/18/23 1425  TempSrc:   PainSc: 3       Patients Stated Pain Goal: 5 (05/18/23 1425)  Complications: No notable events documented.

## 2023-05-18 NOTE — Discharge Instructions (Signed)

## 2023-05-18 NOTE — Progress Notes (Signed)
Plan of Care Note for accepted transfer   Patient: [name] [name] MRN: [id number]   DOA: 05/18/2023  Facility requesting transfer: [facility]. Requesting Provider: Dr. Manus Gunning. Reason for transfer: Possible acute cholecystitis. Facility course:  43 y.o. male hx of Behcet sd., bipolar disorder, hyperlipidemia, seizures, migraine headaches, obstructive sleep apnea on CPAP, with a reported cardiac arrest event in September 2019, s/p loop recorder implantation May 2021.  ILR has not shown any evidence of arrhythmias, pauses, or atrial fib.  Cardiac catheterization 2020 revealed no evidence of coronary artery stenosis presents with complaints of abdominal pain with nausea vomiting.  Ultrasound of the abdomen shows features concerning for acute cholecystitis.  Dr. [name] General Surgeon was consulted.  Started on empiric antibiotics for cholecystitis.  Plan of care: The patient is accepted for admission to Med-surg  unit, at [facility]..   Author: Eduard Clos, MD 05/18/2023  Check [email or web address] for on-call coverage.  Nursing staff, Please call [facility] Admits & Consults System-Wide number on Amion as soon as patient's arrival, so appropriate admitting provider can evaluate the pt.

## 2023-05-18 NOTE — Anesthesia Preprocedure Evaluation (Addendum)
Anesthesia Evaluation  Patient identified by MRN, date of birth, ID band Patient awake    Reviewed: Allergy & Precautions, NPO status , Patient's Chart, lab work & pertinent test results  History of Anesthesia Complications Negative for: history of anesthetic complications  Airway Mallampati: III  TM Distance: >3 FB Neck ROM: Full    Dental  (+) Teeth Intact, Dental Advisory Given   Pulmonary sleep apnea    breath sounds clear to auscultation       Cardiovascular hypertension,  Rhythm:Regular  2020 Cath:  1. Normal coronary anatomy 2. Normal LV function 3. Normal LVEDP  '19 ECHO: EF 65% to 70%. Wall motion was normal;    there were no regional wall motion abnormalities. Doppler    parameters are consistent with abnormal left ventricular    relaxation (grade 1 diastolic dysfunction).  - Mitral valve: Mildly thickened leaflets . There was trivial    regurgitation.     Neuro/Psych  Headaches, Seizures -, Well Controlled,  PSYCHIATRIC DISORDERS Anxiety Depression Bipolar Disorder      GI/Hepatic negative GI ROS, Neg liver ROS,,,Acute cholecystitis   Endo/Other  BMI 31.2 [place]  Renal/GU negative Renal ROS     Musculoskeletal negative musculoskeletal ROS (+)    Abdominal   Peds  Hematology Lab Results      Component                Value               Date                      WBC                      8.7                 05/17/2023                HGB                      15.1                05/17/2023                HCT                      44.9                05/17/2023                MCV                      91.3                05/17/2023                PLT                      189                 05/17/2023              Anesthesia Other Findings In 2019 he had an episode of reported cardiac arrest at a swimming pool, reportedly as he was climbing out of the pool.  A lifeguard apparently performed just 1 or 2  chest compressions, but due to altered mental status he was intubated.  No arrhythmia  was detected.  ECG was normal and subsequent cardiac work-up has been negative (normal echo and oncological nuclear stress test, culminating in coronary angiography in August 2020 which shows normal left ventricular systolic function and normal coronary anatomy).  A previous event monitor showed only episodes of sinus tachycardia.  No bradycardia and no ventricular arrhythmia.  Reproductive/Obstetrics                             Anesthesia Physical Anesthesia Plan  ASA: 3  Anesthesia Plan: General   Post-op Pain Management: Ofirmev IV (intra-op)* and Toradol IV (intra-op)*   Induction: Intravenous, Rapid sequence and Cricoid pressure planned  PONV Risk Score and Plan: 2 and Ondansetron and Dexamethasone  Airway Management Planned: Oral ETT  Additional Equipment: None  Intra-op Plan:   Post-operative Plan: Extubation in OR  Informed Consent: I have reviewed the patients History and Physical, chart, labs and discussed the procedure including the risks, benefits and alternatives for the proposed anesthesia with the patient or authorized representative who has indicated his/her understanding and acceptance.     Dental advisory given  Plan Discussed with: CRNA  Anesthesia Plan Comments:         Anesthesia Quick Evaluation

## 2023-05-18 NOTE — H&P (Addendum)
History and Physical  [name] XLK:[id number] DOB: 14-Apr-1980 DOA: 05/18/2023  PCP: Soundra Pilon, FNP   Chief Complaint: Abdominal pain, nausea  HPI: [name] [name] is a 43 y.o. male with medical history significant for Behcet's, cardiac arrest, bipolar disorder, depression, seizures being admitted to the hospital with 2 days of abdominal pain and nausea found to have acute cholecystitis.  States that he has never had issues like this before, starting a couple days ago started having severe epigastric abdominal pain focused in the right upper quadrant, with associated nausea.  His stools have also been yellow in color.  Denies any fevers, chest pain, shortness of breath.  Came to the ER for evaluation, workup as detailed below consistent with acute cholecystitis.  Overnight, ER provider discussed with general surgery Dr. [name] who agrees to see the patient in consultation and he was transferred to [facility] overnight on the hospitalist service.  Currently denies nausea but has some right upper quadrant abdominal pain.  Review of Systems: Please see HPI for pertinent positives and negatives. A complete 10 system review of systems are otherwise negative.  Past Medical History:  Diagnosis Date   Anxiety    Behcet's disease (HCC)    Bipolar disorder (HCC)    Cardiac arrest (HCC) 12/25/2017   one round of CPR    Depression    Hypertension    Migraine    Seizures (HCC)    last one 2017   Sleep apnea    CPAP at night   Vitamin D deficiency    Past Surgical History:  Procedure Laterality Date   APPENDECTOMY     CYSTOSCOPY WITH URETHRAL DILATATION N/A 10/15/2019   Procedure: CYSTOSCOPY WITH URETHRAL DILATATION WITH BALLOON;  Surgeon: Crista Elliot, MD;  Location: [facility];  Service: Urology;  Laterality: N/A;   FETAL SURGERY FOR CONGENITAL HERNIA     x 2   FRACTURE SURGERY Right    Arm   LEFT HEART CATH AND CORONARY ANGIOGRAPHY N/A 11/29/2018   Procedure: LEFT HEART CATH AND CORONARY  ANGIOGRAPHY;  Surgeon: Swaziland, Peter M, MD;  Location: [facility] INVASIVE CV LAB;  Service: Cardiovascular;  Laterality: N/A;   maxilofacial     NISSEN FUNDOPLICATION     RADIOLOGY WITH ANESTHESIA N/A 02/13/2019   Procedure: MRI WITH ANESTHESIA OF BRAIN WITH AND WITHOUT CONTRAST;  Surgeon: Radiologist, Medication, MD;  Location: [facility] OR;  Service: Radiology;  Laterality: N/A;   XI ROBOTIC ASSISTED VENTRAL HERNIA N/A 01/13/2022   Procedure: ROBOTIC RECURRENT VENTRAL HERNIA REPAIR WITH MESH;  Surgeon: Stechschulte, Hyman Hopes, MD;  Location: [facility];  Service: General;  Laterality: N/A;   Social History:  reports that he has never smoked. He has never used smokeless tobacco. He reports that he does not drink alcohol and does not use drugs.  No Known Allergies  Family History  Problem Relation Age of Onset   Lung cancer Paternal Grandfather    Other Father        BPPV     Prior to Admission medications   Medication Sig Start Date End Date Taking? Authorizing Provider  Apremilast 30 MG TABS Take 30 mg by mouth in the morning and at bedtime.    [provider]  atorvastatin (LIPITOR) 80 MG tablet TAKE 1 TABLET(80 MG) BY MOUTH DAILY 01/31/23   Croitoru, Mihai, MD  azelastine (OPTIVAR) 0.05 % ophthalmic solution Place 2 drops into both eyes 2 (two) times daily. Patient not taking: Reported on 03/06/2023 12/08/22  [provider]  clonazePAM (KLONOPIN) 0.25 MG disintegrating tablet Take 0.25 mg by mouth 3 (three) times daily. 01/05/22   [provider]  FLUoxetine (PROZAC) 40 MG capsule Take 40 mg by mouth 2 (two) times daily.    [provider]  lamoTRIgine 200 MG TBDP DISSOLVE 1 TABLET BY MOUTH EVERY MORNING AND EVERY NIGHT AT BEDTIME 11/30/22   Windell Norfolk, MD  levETIRAcetam (KEPPRA) 750 MG tablet TAKE 1 TABLET(750 MG) BY MOUTH TWICE DAILY 10/31/22   Camara, Amadou, MD  melatonin 5 MG TABS Take 5 mg by mouth at bedtime.    [provider]  montelukast (SINGULAIR)  10 MG tablet Take 10 mg by mouth daily. Patient not taking: Reported on 03/06/2023    [provider]  mycophenolate (CELLCEPT) 500 MG tablet Take 1,000 mg by mouth 2 (two) times daily. 03/20/17   [provider]  QUEtiapine (SEROQUEL) 200 MG tablet Take 200 mg by mouth at bedtime.    [provider]    Physical Exam: BP (!) 137/90 (BP Location: Left Arm)   Pulse 75   Temp (!) 97.5 F (36.4 C) (Oral)   Resp 15   SpO2 100%  General:  Alert, oriented, calm, in mild distress from abdominal pain, sitting upright on the edge of the bed, stable on room air.  Looks nontoxic. Cardiovascular: RRR, no murmurs or rubs, no peripheral edema  Respiratory: clear to auscultation bilaterally, no wheezes, no crackles  Abdomen: soft, tender in the right upper quadrant with some rebound tenderness, nondistended, normal bowel tones heard  Skin: dry, no rashes  Musculoskeletal: no joint effusions, normal range of motion  Psychiatric: appropriate affect, normal speech  Neurologic: extraocular muscles intact, clear speech, moving all extremities with intact sensorium         Labs on Admission:  Basic Metabolic Panel: Recent Labs  Lab 05/17/23 2104  NA 139  K 3.6  CL 106  CO2 22  GLUCOSE 99  BUN 19  CREATININE 1.06  CALCIUM 9.6   Liver Function Tests: Recent Labs  Lab 05/17/23 2104  AST 14*  ALT 22  ALKPHOS 80  BILITOT 0.3  PROT 7.1  ALBUMIN 4.4   Recent Labs  Lab 05/17/23 2104  LIPASE 35   No results for input(s): "AMMONIA" in the last 168 hours. CBC: Recent Labs  Lab 05/17/23 2104  WBC 8.7  HGB 15.1  HCT 44.9  MCV 91.3  PLT 189   Cardiac Enzymes: No results for input(s): "CKTOTAL", "CKMB", "CKMBINDEX", "TROPONINI" in the last 168 hours. BNP (last 3 results) Recent Labs    03/03/23 2102  BNP 14.8    ProBNP (last 3 results) No results for input(s): "PROBNP" in the last 8760 hours.  CBG: No results for input(s): "GLUCAP" in the last 168  hours.  Radiological Exams on Admission: CT ABDOMEN PELVIS W CONTRAST Result Date: 05/18/2023 CLINICAL DATA:  Cholelithiasis. Abdominal pain with nausea, vomiting, diarrhea. Right upper quadrant pain. EXAM: CT ABDOMEN AND PELVIS WITH CONTRAST TECHNIQUE: Multidetector CT imaging of the abdomen and pelvis was performed using the standard protocol following bolus administration of intravenous contrast. RADIATION DOSE REDUCTION: This exam was performed according to the departmental dose-optimization program which includes automated exposure control, adjustment of the mA and/or kV according to patient size and/or use of iterative reconstruction technique. CONTRAST:  OMNIPAQUE IOHEXOL 300 MG/ML  SOLN COMPARISON:  Ultrasound 05/18/2023 CT abdomen and pelvis 03/03/2023 FINDINGS: Lower chest: No acute abnormality. Hepatobiliary: Unremarkable liver. Normal gallbladder.  No radiopaque stone. No biliary dilation. Pancreas: Unremarkable. Spleen: Unremarkable. Adrenals/Urinary Tract: Normal adrenal glands. No urinary calculi or hydronephrosis. Bladder is unremarkable. Stomach/Bowel: Normal caliber large and small bowel. No bowel wall thickening. The appendix is not visualized.Stomach is within normal limits. Vascular/Lymphatic: No significant vascular findings are present. No enlarged abdominal or pelvic lymph nodes. Reproductive: Unremarkable. Other: No free intraperitoneal fluid or air. Musculoskeletal: No acute fracture. IMPRESSION: No acute abnormality in the abdomen or pelvis. Electronically Signed   By: Minerva Fester M.D.   On: 05/18/2023 02:41   US Abdomen Limited RUQ (LIVER/GB) Result Date: 05/18/2023 CLINICAL DATA:  Right upper quadrant pain EXAM: ULTRASOUND ABDOMEN LIMITED RIGHT UPPER QUADRANT COMPARISON:  CT 03/03/2023 FINDINGS: Gallbladder: Questionable sludge in the gallbladder. The gallbladder wall is thickened measuring 5 mm. Positive sonographic Murphy's sign noted by the sonographer. Common bile  duct: Diameter: 6 mm.  No intrahepatic biliary dilation. Liver: No focal lesion identified. Increased parenchymal echogenicity. Portal vein is patent on color Doppler imaging with normal direction of blood flow towards the liver. Other: Limited exam due to overlying bowel gas and body habitus. Limited deep breath hold due to pain. IMPRESSION: 1. Findings suspicious for acute cholecystitis. 2. Hepatic steatosis. Electronically Signed   By: Minerva Fester M.D.   On: 05/18/2023 00:34   Assessment/Plan JEMUEL LAURSEN is a 43 y.o. male with medical history significant for cardiac arrest, bipolar disorder, depression, seizures being admitted to the hospital with 2 days of abdominal pain and nausea found to have acute cholecystitis.  Acute cholecystitis-with postprandial abdominal pain, and imaging as above consistent with acute cholecystitis.  Lab work is unremarkable, no evidence of sepsis. -Observation admission -N.p.o. -Pain and nausea control as needed -[facility] has been consulted  Hyperlipidemia-Lipitor  Depression-Prozac  Chronic medications to be continued once dosing is confirmed: Behcet's disease-continue CellCept Bipolar disorder-continue Klonopin, Lamictal Seroquel at bedtime Seizure disorder-continue Keppra twice daily  DVT prophylaxis: Lovenox     Code Status: Full Code  Consults called: General Surgery  Admission status: Observation  Time spent: 48 minutes  Moni Rothrock Sharlette Dense MD [facility] Pager [phone]  If 7PM-7AM, please contact night-coverage [email or web address] Password [email or web address]  05/18/2023, 7:22 AM

## 2023-05-18 NOTE — TOC Initial Note (Signed)
Transition of Care [facility]) - Initial/Assessment Note    Patient Details  Name: [name] [name] MRN: [id number] Date of Birth: 03-03-1981  [facility] CM/SW Contact:    Howell Rucks, RN Phone Number: 05/18/2023, 12:13 PM  Clinical Narrative:  Met with pt at bedside to introduce role of TOC/NCM and review for dc planning, pt reports she has an established PCP and pharmacy, no current home care services or home DME, reports she feels safe returning home with support from family. MOON, explained, form signed, copy provided to patient. TOC will continue to follow.                   Expected Discharge Plan: Home/Self Care     Patient Goals and CMS Choice Patient states their goals for this hospitalization and ongoing recovery are:: return home          Expected Discharge Plan and Services       Living arrangements for the past 2 months: Single Family Home                                      Prior Living Arrangements/Services Living arrangements for the past 2 months: Single Family Home Lives with:: Self Patient language and need for interpreter reviewed:: Yes Do you feel safe going back to the place where you live?: Yes      Need for Family Participation in Patient Care: Yes (Comment) Care giver support system in place?: Yes (comment)   Criminal Activity/Legal Involvement Pertinent to Current Situation/Hospitalization: No - Comment as needed  Activities of Daily Living   ADL Screening (condition at time of admission) Independently performs ADLs?: Yes (appropriate for developmental age) Is the patient deaf or have difficulty hearing?: No Does the patient have difficulty seeing, even when wearing glasses/contacts?: No Does the patient have difficulty concentrating, remembering, or making decisions?: No  Permission Sought/Granted                  Emotional Assessment Appearance:: Appears stated age Attitude/Demeanor/Rapport: Gracious Affect  (typically observed): Accepting Orientation: : Oriented to Self, Oriented to Place, Oriented to  Time, Oriented to Situation Alcohol / Substance Use: Not Applicable Psych Involvement: No (comment)  Admission diagnosis:  Acute cholecystitis [K81.0] Cholecystitis [K81.9] Patient Active Problem List   Diagnosis Date Noted   Acute cholecystitis 05/18/2023   Recurrent incisional hernia 01/13/2022   Optic neuritis, right 10/08/2021   Constipation 10/08/2021   Central loss of vision, right 10/04/2021   Depression with anxiety 10/04/2021   Acute right eye pain    Chest pain with moderate risk for cardiac etiology 11/29/2018   Cardiac arrest (HCC) 12/25/2017   Intubation of airway performed without difficulty    Lactic acidosis    Seizures (HCC)    Post-ictal coma (HCC) 08/03/2016   Seizure (HCC)    Peripheral vertigo 07/17/2015   Vertigo 07/17/2015   Intractable vomiting 07/17/2015   Diplopia    Cellulitis and abscess of trunk-right chest wall 05/08/2013   Dehydration 02/15/2013   Behcet's disease (HCC) 02/15/2013   Recurrent vomiting 02/15/2013   Encounter for therapeutic drug monitoring 06/14/2012   History of oral aphthous ulcers 10/19/2010   BIPOLAR DISORDER UNSPECIFIED 06/18/2008   Essential hypertension 06/18/2008   SYNCOPE AND COLLAPSE 06/18/2008   Epilepsy (HCC) 06/18/2008   PCP:  Soundra Pilon, FNP Pharmacy:   [facility] [phone] -  [place], [place] - [address] [facility] [address] [place] [address] Phone: [phone] Fax: [phone]     Social Drivers of Health (SDOH) Social History: SDOH Screenings   Food Insecurity: No Food Insecurity (05/18/2023)  Housing: Low Risk  (05/18/2023)  Transportation Needs: No Transportation Needs (05/18/2023)  Utilities: Not At Risk (05/18/2023)  Tobacco Use: Low Risk  (05/17/2023)   SDOH Interventions:     Readmission Risk Interventions    01/15/2022    9:33 AM   Readmission Risk Prevention Plan  Post Dischage Appt Complete  Medication Screening Complete  Transportation Screening Complete

## 2023-05-19 DIAGNOSIS — K811 Chronic cholecystitis: Secondary | ICD-10-CM | POA: Diagnosis not present

## 2023-05-19 DIAGNOSIS — K810 Acute cholecystitis: Secondary | ICD-10-CM | POA: Diagnosis not present

## 2023-05-19 LAB — COMPREHENSIVE METABOLIC PANEL
ALT: 34 U/L (ref 0–44)
AST: 23 U/L (ref 15–41)
Albumin: 4 g/dL (ref 3.5–5.0)
Alkaline Phosphatase: 74 U/L (ref 38–126)
Anion gap: 12 (ref 5–15)
BUN: 12 mg/dL (ref 6–20)
CO2: 19 mmol/L — ABNORMAL LOW (ref 22–32)
Calcium: 9 mg/dL (ref 8.9–10.3)
Chloride: 106 mmol/L (ref 98–111)
Creatinine, Ser: 0.92 mg/dL (ref 0.61–1.24)
GFR, Estimated: >60 mL/min (ref >60)
Glucose, Bld: 133 mg/dL — ABNORMAL HIGH (ref 70–99)
Potassium: 3.8 mmol/L (ref 3.5–5.1)
Sodium: 137 mmol/L (ref 135–145)
Total Bilirubin: 0.8 mg/dL (ref 0.0–1.2)
Total Protein: 7.2 g/dL (ref 6.5–8.1)

## 2023-05-19 LAB — CBC
HCT: 43.5 % (ref 39.0–52.0)
Hemoglobin: 14.6 g/dL (ref 13.0–17.0)
MCH: 31 pg (ref 26.0–34.0)
MCHC: 33.6 g/dL (ref 30.0–36.0)
MCV: 92.4 fL (ref 80.0–100.0)
Platelets: 190 10*3/uL (ref 150–400)
RBC: 4.71 MIL/uL (ref 4.22–5.81)
RDW: 11.9 % (ref 11.5–15.5)
WBC: 9.3 10*3/uL (ref 4.0–10.5)
nRBC: 0 % (ref 0.0–0.2)

## 2023-05-19 LAB — HIV ANTIBODY (ROUTINE TESTING W REFLEX): HIV Screen 4th Generation wRfx: NONREACTIVE

## 2023-05-19 MED ORDER — BISACODYL 5 MG PO TBEC: 5.0000 mg | DELAYED_RELEASE_TABLET | Freq: Every day | ORAL | Status: DC | PRN

## 2023-05-19 NOTE — Plan of Care (Signed)

## 2023-05-19 NOTE — Progress Notes (Signed)
1 Day Post-Op   Subjective/Chief Complaint: Sore, but feeling better No nausea or vomiting Tolerating clears No BM yet   Objective: Vital signs in last 24 hours: Temp:  [97.3 F (36.3 C)-98.4 F (36.9 C)] 98.4 F (36.9 C) (02/15 0608) Pulse Rate:  [77-104] 97 (02/15 0608) Resp:  [10-18] 18 (02/15 0608) BP: (126-156)/(71-107) 133/89 (02/15 0608) SpO2:  [92 %-100 %] 96 % (02/15 0608) Weight:  [104.3 kg] 104.3 kg (02/14 1253) Last BM Date : 05/17/23  Intake/Output from previous day: 02/14 0701 - 02/15 0700 In: 1859.4 [P.O.:50; I.V.:1509.3; IV Piggyback:300] Out: -  Intake/Output this shift: No intake/output data recorded.  General appearance: alert, cooperative, and no distress Abd - incisions c/d/I    Lab Results:  Recent Labs    05/17/23 2104 05/19/23 0333  WBC 8.7 9.3  HGB 15.1 14.6  HCT 44.9 43.5  PLT 189 190   BMET Recent Labs    05/17/23 2104 05/19/23 0333  NA 139 137  K 3.6 3.8  CL 106 106  CO2 22 19*  GLUCOSE 99 133*  BUN 19 12  CREATININE 1.06 0.92  CALCIUM 9.6 9.0      Latest Ref Rng & Units 05/19/2023    3:33 AM 05/17/2023    9:04 PM 03/03/2023    7:09 PM  Hepatic Function  Total Protein 6.5 - 8.1 g/dL 7.2  7.1  7.7   Albumin 3.5 - 5.0 g/dL 4.0  4.4  4.7   AST 15 - 41 U/L 23  14  24    ALT 0 - 44 U/L 34  22  40   Alk Phosphatase 38 - 126 U/L 74  80  85   Total Bilirubin 0.0 - 1.2 mg/dL 0.8  0.3  0.3   Bilirubin, Direct 0.0 - 0.2 mg/dL   0.1     Studies/Results: CT ABDOMEN PELVIS W CONTRAST Result Date: 05/18/2023 CLINICAL DATA:  Cholelithiasis. Abdominal pain with nausea, vomiting, diarrhea. Right upper quadrant pain. EXAM: CT ABDOMEN AND PELVIS WITH CONTRAST TECHNIQUE: Multidetector CT imaging of the abdomen and pelvis was performed using the standard protocol following bolus administration of intravenous contrast. RADIATION DOSE REDUCTION: This exam was performed according to the departmental dose-optimization program which includes  automated exposure control, adjustment of the mA and/or kV according to patient size and/or use of iterative reconstruction technique. CONTRAST:  OMNIPAQUE IOHEXOL 300 MG/ML  SOLN COMPARISON:  Ultrasound 05/18/2023 CT abdomen and pelvis 03/03/2023 FINDINGS: Lower chest: No acute abnormality. Hepatobiliary: Unremarkable liver. Normal gallbladder. No radiopaque stone. No biliary dilation. Pancreas: Unremarkable. Spleen: Unremarkable. Adrenals/Urinary Tract: Normal adrenal glands. No urinary calculi or hydronephrosis. Bladder is unremarkable. Stomach/Bowel: Normal caliber large and small bowel. No bowel wall thickening. The appendix is not visualized.Stomach is within normal limits. Vascular/Lymphatic: No significant vascular findings are present. No enlarged abdominal or pelvic lymph nodes. Reproductive: Unremarkable. Other: No free intraperitoneal fluid or air. Musculoskeletal: No acute fracture. IMPRESSION: No acute abnormality in the abdomen or pelvis. Electronically Signed   By: Minerva Fester M.D.   On: 05/18/2023 02:41   US Abdomen Limited RUQ (LIVER/GB) Result Date: 05/18/2023 CLINICAL DATA:  Right upper quadrant pain EXAM: ULTRASOUND ABDOMEN LIMITED RIGHT UPPER QUADRANT COMPARISON:  CT 03/03/2023 FINDINGS: Gallbladder: Questionable sludge in the gallbladder. The gallbladder wall is thickened measuring 5 mm. Positive sonographic Murphy's sign noted by the sonographer. Common bile duct: Diameter: 6 mm.  No intrahepatic biliary dilation. Liver: No focal lesion identified. Increased parenchymal echogenicity. Portal vein is patent on  color Doppler imaging with normal direction of blood flow towards the liver. Other: Limited exam due to overlying bowel gas and body habitus. Limited deep breath hold due to pain. IMPRESSION: 1. Findings suspicious for acute cholecystitis. 2. Hepatic steatosis. Electronically Signed   By: Minerva Fester M.D.   On: 05/18/2023 00:34    Anti-infectives: Anti-infectives  (From admission, onward)    Start     Dose/Rate Route Frequency Ordered Stop   05/19/23 0200  cefTRIAXone (ROCEPHIN) 2 g in sodium chloride 0.9 % 100 mL IVPB  Status:  Discontinued        2 g 200 mL/hr over 30 Minutes Intravenous Every 24 hours 05/18/23 0515 05/18/23 1817   05/18/23 0600  metroNIDAZOLE (FLAGYL) IVPB 500 mg  Status:  Discontinued        500 mg 100 mL/hr over 60 Minutes Intravenous Every 12 hours 05/18/23 0515 05/18/23 1817   05/18/23 0145  cefTRIAXone (ROCEPHIN) 2 g in sodium chloride 0.9 % 100 mL IVPB        2 g 200 mL/hr over 30 Minutes Intravenous  Once 05/18/23 0137 05/18/23 0232       Assessment/Plan: s/p Procedure(s): LAPAROSCOPIC CHOLECYSTECTOMY (N/A) Acute cholecystitis  Advance to regular diet If he tolerates diet, then discharge this afternoon.  LOS: 0 days    Wynona Luna 05/19/2023

## 2023-05-19 NOTE — Discharge Summary (Signed)
Physician Discharge Summary  NIZAR CUTLER ZOX:[id number] DOB: Jan 22, 1981 DOA: 05/18/2023  PCP: Soundra Pilon, FNP  Admit date: 05/18/2023 Discharge date: 05/19/2023  Admitted From: Home Disposition: Home  Recommendations for Outpatient Follow-up:  Follow up with PCP in 1-2 weeks Please obtain BMP/CBC in one week your next doctors visit.  Post op management by general surgery   Discharge Condition: Stable CODE STATUS: Full code Diet recommendation: Regular  Brief/Interim Summary: Brief Narrative:  43 year old with history of Behcet's, cardiac arrest, bipolar, depression, seizure comes to the hospital 2 days of abdominal pain.  Upon admission found to have acute cholecystitis and patient underwent laparoscopic cholecystectomy on 2/14.  Cardiology team was consulted for preop clearance and no further workup determined.  Postop management per general surgery. This is later today if tolerates p.o.  Assessment & Plan:  Principal Problem:   Acute cholecystitis    Acute cholecystitis - Seen by general surgery, underwent laparoscopic cholecystectomy on 2/14.  Postop management by general surgery.   Hyperlipidemia -Lipitor   Depression -Prozac   Behcet's disease -continue CellCept  Bipolar disorder -continue Klonopin, Lamictal Seroquel at bedtime  Seizure disorder -continue Keppra twice daily     DVT prophylaxis: Lovenox    Code Status: Full Code Family Communication:   DC once cleared by general surgery    Subjective: Doing okay no complaints.  Wishing to go home.   Examination:  General exam: Appears calm and comfortable  Respiratory system: Clear to auscultation. Respiratory effort normal. Cardiovascular system: S1 & S2 heard, RRR. No JVD, murmurs, rubs, gallops or clicks. No pedal edema. Gastrointestinal system: Abdomen is nondistended, soft and nontender. No organomegaly or masses felt. Normal bowel sounds heard. Central nervous system: Alert and oriented.  No focal neurological deficits. Extremities: Symmetric 5 x 5 power. Skin: No rashes, lesions or ulcers Psychiatry: Judgement and insight appear normal. Mood & affect appropriate.    Discharge Diagnoses:  Principal Problem:   Acute cholecystitis      Discharge Exam: Vitals:   05/18/23 2244 05/19/23 0608  BP: 126/71 133/89  Pulse: 96 97  Resp: 17 18  Temp: 98.2 F (36.8 C) 98.4 F (36.9 C)  SpO2: 92% 96%   Vitals:   05/18/23 1723 05/18/23 1944 05/18/23 2244 05/19/23 0608  BP: (!) 132/107 (!) 148/95 126/71 133/89  Pulse: 86 95 96 97  Resp: 14 16 17 18   Temp: 98 F (36.7 C) 98.2 F (36.8 C) 98.2 F (36.8 C) 98.4 F (36.9 C)  TempSrc: Oral Oral    SpO2: 100% 100% 92% 96%  Weight:      Height:          Discharge Instructions  Discharge Instructions     Call MD for:  persistant nausea and vomiting   Complete by: As directed    Call MD for:  redness, tenderness, or signs of infection (pain, swelling, redness, odor or green/yellow discharge around incision site)   Complete by: As directed    Call MD for:  severe uncontrolled pain   Complete by: As directed    Call MD for:  temperature >100.4   Complete by: As directed    Diet general   Complete by: As directed    Driving Restrictions   Complete by: As directed    Do not drive while taking pain medications   Increase activity slowly   Complete by: As directed    May shower / Bathe   Complete by: As directed  Allergies as of 05/19/2023   No Known Allergies      Medication List     TAKE these medications    Apremilast 30 MG Tabs Take 30 mg by mouth in the morning and at bedtime.   atorvastatin 80 MG tablet Commonly known as: LIPITOR TAKE 1 TABLET(80 MG) BY MOUTH DAILY   clonazePAM 0.5 MG tablet Commonly known as: KLONOPIN Take 0.25 mg by mouth in the morning, at noon, and at bedtime.   FLUoxetine 40 MG capsule Commonly known as: PROZAC Take 40 mg by mouth 2 (two) times daily.    lamoTRIgine 200 MG Tbdp DISSOLVE 1 TABLET BY MOUTH EVERY MORNING AND EVERY NIGHT AT BEDTIME   levETIRAcetam 750 MG tablet Commonly known as: KEPPRA TAKE 1 TABLET(750 MG) BY MOUTH TWICE DAILY   melatonin 5 MG Tabs Take 5 mg by mouth at bedtime.   mycophenolate 500 MG tablet Commonly known as: CELLCEPT Take 500 mg by mouth 2 (two) times daily.   oxyCODONE 5 MG immediate release tablet Commonly known as: Roxicodone Take 1 tablet (5 mg total) by mouth every 6 (six) hours as needed for up to 5 days for breakthrough pain.   QUEtiapine 200 MG tablet Commonly known as: SEROQUEL Take 200 mg by mouth at bedtime.   Wegovy 0.5 MG/0.5ML Soaj Generic drug: Semaglutide-Weight Management Inject 0.5 mg into the skin once a week.        Follow-up Information     Maczis, Hedda Slade, PA-C Follow up on 06/14/2023.   Specialty: General Surgery Why: 1:30pm. Please bring a copy of your photo ID and insurance card. Please arrive 30 minutes prior to your appointment for paperwork. Contact information: [address] [place] [address] [phone]                No Known Allergies  You were cared for by a hospitalist during your hospital stay. If you have any questions about your discharge medications or the care you received while you were in the hospital after you are discharged, you can call the unit and asked to speak with the hospitalist on call if the hospitalist that took care of you is not available. Once you are discharged, your primary care physician will handle any further medical issues. Please note that no refills for any discharge medications will be authorized once you are discharged, as it is imperative that you return to your primary care physician (or establish a relationship with a primary care physician if you do not have one) for your aftercare needs so that they can reassess your need for medications and monitor your lab values.  You were cared for by a  hospitalist during your hospital stay. If you have any questions about your discharge medications or the care you received while you were in the hospital after you are discharged, you can call the unit and asked to speak with the hospitalist on call if the hospitalist that took care of you is not available. Once you are discharged, your primary care physician will handle any further medical issues. Please note that NO REFILLS for any discharge medications will be authorized once you are discharged, as it is imperative that you return to your primary care physician (or establish a relationship with a primary care physician if you do not have one) for your aftercare needs so that they can reassess your need for medications and monitor your lab values.  Please request your Prim.MD to go over all  Hospital Tests and Procedure/Radiological results at the follow up, please get all Hospital records sent to your Prim MD by signing hospital release before you go home.  Get CBC, CMP, 2 view Chest X ray checked  by Primary MD during your next visit or SNF MD in 5-7 days ( we routinely change or add medications that can affect your baseline labs and fluid status, therefore we recommend that you get the mentioned basic workup next visit with your PCP, your PCP may decide not to get them or add new tests based on their clinical decision)  On your next visit with your primary care physician please Get Medicines reviewed and adjusted.  If you experience worsening of your admission symptoms, develop shortness of breath, life threatening emergency, suicidal or homicidal thoughts you must seek medical attention immediately by calling 911 or calling your MD immediately  if symptoms less severe.  You Must read complete instructions/literature along with all the possible adverse reactions/side effects for all the Medicines you take and that have been prescribed to you. Take any new Medicines after you have completely understood  and accpet all the possible adverse reactions/side effects.   Do not drive, operate heavy machinery, perform activities at heights, swimming or participation in water activities or provide baby sitting services if your were admitted for syncope or siezures until you have seen by Primary MD or a Neurologist and advised to do so again.  Do not drive when taking Pain medications.   Procedures/Studies: CT ABDOMEN PELVIS W CONTRAST Result Date: 05/18/2023 CLINICAL DATA:  Cholelithiasis. Abdominal pain with nausea, vomiting, diarrhea. Right upper quadrant pain. EXAM: CT ABDOMEN AND PELVIS WITH CONTRAST TECHNIQUE: Multidetector CT imaging of the abdomen and pelvis was performed using the standard protocol following bolus administration of intravenous contrast. RADIATION DOSE REDUCTION: This exam was performed according to the departmental dose-optimization program which includes automated exposure control, adjustment of the mA and/or kV according to patient size and/or use of iterative reconstruction technique. CONTRAST:  OMNIPAQUE IOHEXOL 300 MG/ML  SOLN COMPARISON:  Ultrasound 05/18/2023 CT abdomen and pelvis 03/03/2023 FINDINGS: Lower chest: No acute abnormality. Hepatobiliary: Unremarkable liver. Normal gallbladder. No radiopaque stone. No biliary dilation. Pancreas: Unremarkable. Spleen: Unremarkable. Adrenals/Urinary Tract: Normal adrenal glands. No urinary calculi or hydronephrosis. Bladder is unremarkable. Stomach/Bowel: Normal caliber large and small bowel. No bowel wall thickening. The appendix is not visualized.Stomach is within normal limits. Vascular/Lymphatic: No significant vascular findings are present. No enlarged abdominal or pelvic lymph nodes. Reproductive: Unremarkable. Other: No free intraperitoneal fluid or air. Musculoskeletal: No acute fracture. IMPRESSION: No acute abnormality in the abdomen or pelvis. Electronically Signed   By: Minerva Fester M.D.   On: 05/18/2023 02:41   US  Abdomen Limited RUQ (LIVER/GB) Result Date: 05/18/2023 CLINICAL DATA:  Right upper quadrant pain EXAM: ULTRASOUND ABDOMEN LIMITED RIGHT UPPER QUADRANT COMPARISON:  CT 03/03/2023 FINDINGS: Gallbladder: Questionable sludge in the gallbladder. The gallbladder wall is thickened measuring 5 mm. Positive sonographic Murphy's sign noted by the sonographer. Common bile duct: Diameter: 6 mm.  No intrahepatic biliary dilation. Liver: No focal lesion identified. Increased parenchymal echogenicity. Portal vein is patent on color Doppler imaging with normal direction of blood flow towards the liver. Other: Limited exam due to overlying bowel gas and body habitus. Limited deep breath hold due to pain. IMPRESSION: 1. Findings suspicious for acute cholecystitis. 2. Hepatic steatosis. Electronically Signed   By: Minerva Fester M.D.   On: 05/18/2023 00:34     The results of  significant diagnostics from this hospitalization (including imaging, microbiology, ancillary and laboratory) are listed below for reference.     Microbiology: No results found for this or any previous visit (from the past 240 hours).   Labs: BNP (last 3 results) Recent Labs    03/03/23 2102  BNP 14.8   Basic Metabolic Panel: Recent Labs  Lab 05/17/23 2104 05/19/23 0333  NA 139 137  K 3.6 3.8  CL 106 106  CO2 22 19*  GLUCOSE 99 133*  BUN 19 12  CREATININE 1.06 0.92  CALCIUM 9.6 9.0   Liver Function Tests: Recent Labs  Lab 05/17/23 2104 05/19/23 0333  AST 14* 23  ALT 22 34  ALKPHOS 80 74  BILITOT 0.3 0.8  PROT 7.1 7.2  ALBUMIN 4.4 4.0   Recent Labs  Lab 05/17/23 2104  LIPASE 35   No results for input(s): "AMMONIA" in the last 168 hours. CBC: Recent Labs  Lab 05/17/23 2104 05/19/23 0333  WBC 8.7 9.3  HGB 15.1 14.6  HCT 44.9 43.5  MCV 91.3 92.4  PLT 189 190   Cardiac Enzymes: No results for input(s): "CKTOTAL", "CKMB", "CKMBINDEX", "TROPONINI" in the last 168 hours. BNP: Invalid input(s):  "POCBNP" CBG: No results for input(s): "GLUCAP" in the last 168 hours. D-Dimer No results for input(s): "DDIMER" in the last 72 hours. Hgb A1c No results for input(s): "HGBA1C" in the last 72 hours. Lipid Profile No results for input(s): "CHOL", "HDL", "LDLCALC", "TRIG", "CHOLHDL", "LDLDIRECT" in the last 72 hours. Thyroid function studies No results for input(s): "TSH", "T4TOTAL", "T3FREE", "THYROIDAB" in the last 72 hours.  Invalid input(s): "FREET3" Anemia work up No results for input(s): "VITAMINB12", "FOLATE", "FERRITIN", "TIBC", "IRON", "RETICCTPCT" in the last 72 hours. Urinalysis    Component Value Date/Time   COLORURINE YELLOW 05/17/2023 2104   APPEARANCEUR CLEAR 05/17/2023 2104   LABSPEC 1.045 (H) 05/17/2023 2104   PHURINE 6.0 05/17/2023 2104   GLUCOSEU NEGATIVE 05/17/2023 2104   HGBUR NEGATIVE 05/17/2023 2104   BILIRUBINUR NEGATIVE 05/17/2023 2104   KETONESUR TRACE (A) 05/17/2023 2104   PROTEINUR TRACE (A) 05/17/2023 2104   UROBILINOGEN 1.0 08/15/2013 2011   NITRITE NEGATIVE 05/17/2023 2104   LEUKOCYTESUR NEGATIVE 05/17/2023 2104   Sepsis Labs Recent Labs  Lab 05/17/23 2104 05/19/23 0333  WBC 8.7 9.3   Microbiology No results found for this or any previous visit (from the past 240 hours).   Time coordinating discharge:  I have spent 35 minutes face to face with the patient and on the ward discussing the patients care, assessment, plan and disposition with other care givers. >50% of the time was devoted counseling the patient about the risks and benefits of treatment/Discharge disposition and coordinating care.   SIGNED:   Miguel Rota, MD  [facility] 05/19/2023, 1:01 PM   If 7PM-7AM, please contact night-coverage

## 2023-05-19 NOTE — Progress Notes (Signed)
   Preoperative evaluation yesterday by Dr Servando Salina, please refer to her note for details. No additional cardiology recs at this time, may contact us in the postop period if needed    Signed, Dina Rich, MD  05/19/2023, 6:53 AM

## 2023-05-19 NOTE — Hospital Course (Addendum)
Brief Narrative:  43 year old with history of Behcet's, cardiac arrest, bipolar, depression, seizure comes to the hospital 2 days of abdominal pain.  Upon admission found to have acute cholecystitis and patient underwent laparoscopic cholecystectomy on 2/14.  Cardiology team was consulted for preop clearance and no further workup determined.  Postop management per general surgery. This is later today if tolerates p.o.  Assessment & Plan:  Principal Problem:   Acute cholecystitis    Acute cholecystitis - Seen by general surgery, underwent laparoscopic cholecystectomy on 2/14.  Postop management by general surgery.   Hyperlipidemia -Lipitor   Depression -Prozac   Behcet's disease -continue CellCept  Bipolar disorder -continue Klonopin, Lamictal Seroquel at bedtime  Seizure disorder -continue Keppra twice daily     DVT prophylaxis: Lovenox    Code Status: Full Code Family Communication:   DC once cleared by general surgery    Subjective: Doing okay no complaints.  Wishing to go home.   Examination:  General exam: Appears calm and comfortable  Respiratory system: Clear to auscultation. Respiratory effort normal. Cardiovascular system: S1 & S2 heard, RRR. No JVD, murmurs, rubs, gallops or clicks. No pedal edema. Gastrointestinal system: Abdomen is nondistended, soft and nontender. No organomegaly or masses felt. Normal bowel sounds heard. Central nervous system: Alert and oriented. No focal neurological deficits. Extremities: Symmetric 5 x 5 power. Skin: No rashes, lesions or ulcers Psychiatry: Judgement and insight appear normal. Mood & affect appropriate.

## 2023-05-20 NOTE — Anesthesia Postprocedure Evaluation (Signed)
Anesthesia Post Note  Patient: [name] [name]  Procedure(s) Performed: LAPAROSCOPIC CHOLECYSTECTOMY     Patient location during evaluation: PACU Anesthesia Type: General Level of consciousness: awake and alert Pain management: pain level controlled Vital Signs Assessment: post-procedure vital signs reviewed and stable Respiratory status: spontaneous breathing, nonlabored ventilation and respiratory function stable Cardiovascular status: blood pressure returned to baseline and stable Postop Assessment: no apparent nausea or vomiting Anesthetic complications: no   No notable events documented.                  Lennon Richins

## 2023-05-21 ENCOUNTER — Encounter (HOSPITAL_COMMUNITY): Payer: Self-pay | Admitting: Surgery

## 2023-05-21 ENCOUNTER — Ambulatory Visit (INDEPENDENT_AMBULATORY_CARE_PROVIDER_SITE_OTHER): Payer: Medicare Other

## 2023-05-21 DIAGNOSIS — R55 Syncope and collapse: Secondary | ICD-10-CM

## 2023-05-22 LAB — CUP PACEART REMOTE DEVICE CHECK
Date Time Interrogation Session: 20250216230150
Implantable Pulse Generator Implant Date: 20210512

## 2023-05-23 ENCOUNTER — Encounter: Payer: Self-pay | Admitting: Cardiovascular Disease

## 2023-05-23 LAB — SURGICAL PATHOLOGY

## 2023-05-28 NOTE — Progress Notes (Signed)
 [facility] Summary Report / Loop Recorder

## 2023-06-25 ENCOUNTER — Ambulatory Visit (INDEPENDENT_AMBULATORY_CARE_PROVIDER_SITE_OTHER): Payer: Medicare Other

## 2023-06-25 DIAGNOSIS — R55 Syncope and collapse: Secondary | ICD-10-CM

## 2023-06-25 LAB — CUP PACEART REMOTE DEVICE CHECK
Date Time Interrogation Session: 20250323230148
Implantable Pulse Generator Implant Date: 20210512

## 2023-06-26 NOTE — Progress Notes (Signed)
 [facility] Summary Report / Loop Recorder

## 2023-06-26 NOTE — Addendum Note (Signed)
 Addended by: Geralyn Flash D on: 06/26/2023 04:06 PM   Modules accepted: Orders

## 2023-07-30 ENCOUNTER — Encounter: Payer: Self-pay | Admitting: Cardiovascular Disease

## 2023-07-30 ENCOUNTER — Ambulatory Visit: Payer: Medicare Other

## 2023-07-30 DIAGNOSIS — R55 Syncope and collapse: Secondary | ICD-10-CM | POA: Diagnosis not present

## 2023-07-30 LAB — CUP PACEART REMOTE DEVICE CHECK
Date Time Interrogation Session: 20250427230317
Implantable Pulse Generator Implant Date: 20210512

## 2023-08-02 ENCOUNTER — Emergency Department (HOSPITAL_BASED_OUTPATIENT_CLINIC_OR_DEPARTMENT_OTHER)
Admission: EM | Admit: 2023-08-02 | Discharge: 2023-08-02 | Disposition: A | Discharge status: Home / Self Care | Attending: Emergency Medicine | Admitting: Emergency Medicine

## 2023-08-02 ENCOUNTER — Other Ambulatory Visit: Payer: Self-pay

## 2023-08-02 ENCOUNTER — Encounter (HOSPITAL_BASED_OUTPATIENT_CLINIC_OR_DEPARTMENT_OTHER): Payer: Self-pay | Admitting: Emergency Medicine

## 2023-08-02 ENCOUNTER — Emergency Department (HOSPITAL_BASED_OUTPATIENT_CLINIC_OR_DEPARTMENT_OTHER)

## 2023-08-02 DIAGNOSIS — R110 Nausea: Secondary | ICD-10-CM | POA: Insufficient documentation

## 2023-08-02 DIAGNOSIS — R509 Fever, unspecified: Secondary | ICD-10-CM | POA: Diagnosis not present

## 2023-08-02 DIAGNOSIS — I10 Essential (primary) hypertension: Secondary | ICD-10-CM | POA: Insufficient documentation

## 2023-08-02 DIAGNOSIS — R1011 Right upper quadrant pain: Secondary | ICD-10-CM | POA: Diagnosis present

## 2023-08-02 DIAGNOSIS — R1084 Generalized abdominal pain: Secondary | ICD-10-CM | POA: Insufficient documentation

## 2023-08-02 LAB — CBC
HCT: 45 % (ref 39.0–52.0)
Hemoglobin: 14.9 g/dL (ref 13.0–17.0)
MCH: 30.7 pg (ref 26.0–34.0)
MCHC: 33.1 g/dL (ref 30.0–36.0)
MCV: 92.6 fL (ref 80.0–100.0)
Platelets: 197 10*3/uL (ref 150–400)
RBC: 4.86 MIL/uL (ref 4.22–5.81)
RDW: 12.1 % (ref 11.5–15.5)
WBC: 10.7 10*3/uL — ABNORMAL HIGH (ref 4.0–10.5)
nRBC: 0 % (ref 0.0–0.2)

## 2023-08-02 LAB — URINALYSIS, ROUTINE W REFLEX MICROSCOPIC
Bilirubin Urine: NEGATIVE
Glucose, UA: NEGATIVE mg/dL
Hgb urine dipstick: NEGATIVE
Ketones, ur: NEGATIVE mg/dL
Leukocytes,Ua: NEGATIVE
Nitrite: NEGATIVE
Protein, ur: NEGATIVE mg/dL
Specific Gravity, Urine: 1.029 (ref 1.005–1.030)
pH: 5.5 (ref 5.0–8.0)

## 2023-08-02 LAB — COMPREHENSIVE METABOLIC PANEL WITH GFR
ALT: 40 U/L (ref 0–44)
AST: 27 U/L (ref 15–41)
Albumin: 4.6 g/dL (ref 3.5–5.0)
Alkaline Phosphatase: 134 U/L — ABNORMAL HIGH (ref 38–126)
Anion gap: 12 (ref 5–15)
BUN: 13 mg/dL (ref 6–20)
CO2: 24 mmol/L (ref 22–32)
Calcium: 9.9 mg/dL (ref 8.9–10.3)
Chloride: 103 mmol/L (ref 98–111)
Creatinine, Ser: 1.07 mg/dL (ref 0.61–1.24)
GFR, Estimated: >60 mL/min (ref >60)
Glucose, Bld: 108 mg/dL — ABNORMAL HIGH (ref 70–99)
Potassium: 3.8 mmol/L (ref 3.5–5.1)
Sodium: 139 mmol/L (ref 135–145)
Total Bilirubin: <0.2 mg/dL (ref 0.0–1.2)
Total Protein: 7.4 g/dL (ref 6.5–8.1)

## 2023-08-02 LAB — LIPASE, BLOOD: Lipase: 36 U/L (ref 11–51)

## 2023-08-02 MED ORDER — MORPHINE SULFATE (PF) 4 MG/ML IV SOLN
4.0000 mg | Freq: Once | INTRAVENOUS | Status: AC
  Administered 2023-08-02: 4 mg via INTRAVENOUS
  Filled 2023-08-02: qty 1

## 2023-08-02 MED ORDER — DICYCLOMINE HCL 20 MG PO TABS: 20.0000 mg | ORAL_TABLET | Freq: Two times a day (BID) | ORAL | 0 refills | Status: DC

## 2023-08-02 MED ORDER — ONDANSETRON 4 MG PO TBDP: 4.0000 mg | ORAL_TABLET | Freq: Three times a day (TID) | ORAL | 0 refills | Status: DC | PRN

## 2023-08-02 MED ORDER — ONDANSETRON HCL 4 MG/2ML IJ SOLN
4.0000 mg | Freq: Once | INTRAMUSCULAR | Status: AC
  Administered 2023-08-02: 4 mg via INTRAVENOUS
  Filled 2023-08-02: qty 2

## 2023-08-02 MED ORDER — IOHEXOL 300 MG/ML  SOLN
100.0000 mL | Freq: Once | INTRAMUSCULAR | Status: AC | PRN
  Administered 2023-08-02: 100 mL via INTRAVENOUS

## 2023-08-02 MED ORDER — LACTATED RINGERS IV BOLUS
1000.0000 mL | Freq: Once | INTRAVENOUS | Status: AC
  Administered 2023-08-02: 1000 mL via INTRAVENOUS

## 2023-08-02 NOTE — ED Provider Notes (Signed)
 [place] EMERGENCY DEPARTMENT AT [facility] Provider Note   CSN: [id number] Arrival date & time: 08/02/23  1911     History  Chief Complaint  Patient presents with   Abdominal Pain    [name] [name] is a 43 y.o. male.  HPI     43 yo male with history of laparoscopic cholecystectomy May 18, 2023 with Dr. Marny Sires, anxiety, bipolar, behcet's, hypertension, who presents with concern for abdominal pain.   8 days ago began to have abdominal pain For the last 8-9 days has had diarrhea, watery, yellow colored Everything eating going right through Pain gradually worse Pain RUQ and radiates into back and into shoulder, pain worse with eating and gets indigestion--had similar symptoms prior to GB removed Thought maybe it was the diet but had not eaten anything out of the ordinary No recent abx, no sick contacts Fever this AM was 101 Nausea, no vomiting Generally weak, fatigue, feels "like crap"   Home Medications Prior to Admission medications   Medication Sig Start Date End Date Taking? Authorizing Provider  dicyclomine  (BENTYL ) 20 MG tablet Take 1 tablet (20 mg total) by mouth 2 (two) times daily. 08/02/23  Yes Scarlette Currier, MD  ondansetron  (ZOFRAN -ODT) 4 MG disintegrating tablet Take 1 tablet (4 mg total) by mouth every 8 (eight) hours as needed for nausea or vomiting. 08/02/23  Yes Scarlette Currier, MD  Apremilast  30 MG TABS Take 30 mg by mouth in the morning and at bedtime.    [provider]  atorvastatin  (LIPITOR) 80 MG tablet TAKE 1 TABLET(80 MG) BY MOUTH DAILY 01/31/23   Croitoru, Mihai, MD  clonazePAM  (KLONOPIN ) 0.5 MG tablet Take 0.25 mg by mouth in the morning, at noon, and at bedtime.    [provider]  FLUoxetine  (PROZAC ) 40 MG capsule Take 40 mg by mouth 2 (two) times daily.    [provider]  lamoTRIgine  200 MG TBDP DISSOLVE 1 TABLET BY MOUTH EVERY MORNING AND EVERY NIGHT AT BEDTIME 11/30/22   Camara, Amadou, MD   levETIRAcetam  (KEPPRA ) 750 MG tablet TAKE 1 TABLET(750 MG) BY MOUTH TWICE DAILY 10/31/22   Camara, Amadou, MD  melatonin 5 MG TABS Take 5 mg by mouth at bedtime.    [provider]  mycophenolate  (CELLCEPT ) 500 MG tablet Take 500 mg by mouth 2 (two) times daily. 03/20/17   [provider]  QUEtiapine  (SEROQUEL ) 200 MG tablet Take 200 mg by mouth at bedtime.    [provider]  WEGOVY 0.5 MG/0.5ML SOAJ Inject 0.5 mg into the skin once a week. 03/31/23   [provider]      Allergies    Patient has no known allergies.    Review of Systems   Review of Systems  Physical Exam Updated Vital Signs BP (!) 149/95   Pulse 91   Temp 98.6 F (37 C)   Resp 18   Ht 6' (1.829 m)   Wt 99.8 kg   SpO2 99%   BMI 29.84 kg/m  Physical Exam Vitals and nursing note reviewed.  Constitutional:      General: He is not in acute distress.    Appearance: He is well-developed. He is not diaphoretic.  HENT:     Head: Normocephalic and atraumatic.  Eyes:     Conjunctiva/sclera: Conjunctivae normal.  Cardiovascular:     Rate and Rhythm: Normal rate and regular rhythm.     Heart sounds: Normal heart sounds. No murmur heard.    No friction rub.  No gallop.  Pulmonary:     Effort: Pulmonary effort is normal. No respiratory distress.     Breath sounds: Normal breath sounds. No wheezing or rales.  Abdominal:     General: There is no distension.     Palpations: Abdomen is soft.     Tenderness: There is abdominal tenderness in the right upper quadrant and epigastric area. There is no guarding. Negative signs include McBurney's sign.  Musculoskeletal:     Cervical back: Normal range of motion.  Skin:    General: Skin is warm and dry.  Neurological:     Mental Status: He is alert and oriented to person, place, and time.     ED Results / Procedures / Treatments   Labs (all labs ordered are listed, but only abnormal results are displayed) Labs Reviewed   COMPREHENSIVE METABOLIC PANEL WITH GFR - Abnormal; Notable for the following components:      Result Value   Glucose, Bld 108 (*)    Alkaline Phosphatase 134 (*)    All other components within normal limits  CBC - Abnormal; Notable for the following components:   WBC 10.7 (*)    All other components within normal limits  LIPASE, BLOOD  URINALYSIS, ROUTINE W REFLEX MICROSCOPIC    EKG EKG Interpretation Date/Time:  Thursday Aug 02 2023 19:29:23 EDT Ventricular Rate:  106 PR Interval:  168 QRS Duration:  82 QT Interval:  326 QTC Calculation: 433 R Axis:   33  Text Interpretation: Sinus tachycardia Possible Inferior infarct (cited on or before 02-Jan-2022) ST & T wave abnormality, consider anterolateral ischemia Abnormal ECG When compared with ECG of 18-May-2023 06:02, T wave inversion more evident in Lateral leads Since prior ECG, heart rate increased, similar TW abnormalities diffusely Confirmed by Scarlette Currier ([id number]) on 08/02/2023 9:24:55 PM  Radiology CT ABDOMEN PELVIS W CONTRAST Result Date: 08/02/2023 CLINICAL DATA:  Abdominal pain, acute, nonlocalized Patient reports RUQ pain and diarrhea for 2 weeks. Reports having gallbladder taken out in February and pain feels the same as it was before having surgery. Denies nausea/vomiting. EXAM: CT ABDOMEN AND PELVIS WITH CONTRAST TECHNIQUE: Multidetector CT imaging of the abdomen and pelvis was performed using the standard protocol following bolus administration of intravenous contrast. RADIATION DOSE REDUCTION: This exam was performed according to the departmental dose-optimization program which includes automated exposure control, adjustment of the mA and/or kV according to patient size and/or use of iterative reconstruction technique. CONTRAST:  OMNIPAQUE  IOHEXOL  300 MG/ML  SOLN COMPARISON:  CT abdomen pelvis 05/18/2023 FINDINGS: Lower chest: No acute abnormality. Hepatobiliary: No focal liver abnormality. Status post cholecystectomy.  No biliary dilatation. Pancreas: No focal lesion. Normal pancreatic contour. No surrounding inflammatory changes. No main pancreatic ductal dilatation. Spleen: Normal in size without focal abnormality. Adrenals/Urinary Tract: No adrenal nodule bilaterally. Bilateral kidneys enhance symmetrically. No hydronephrosis. No hydroureter. The urinary bladder is unremarkable. Stomach/Bowel: Fundoplication surgical changes noted. Stomach is within normal limits. No evidence of bowel wall thickening or dilatation. The appendix is not definitely identified with no inflammatory changes in the right lower quadrant to suggest acute appendicitis. Vascular/Lymphatic: No abdominal aorta or iliac aneurysm. No abdominal, pelvic, or inguinal lymphadenopathy. Reproductive: Prostate is unremarkable. Other: No intraperitoneal free fluid. No intraperitoneal free gas. No organized fluid collection. Musculoskeletal: No abdominal wall hernia or abnormality. No suspicious lytic or blastic osseous lesions. No acute displaced fracture. IMPRESSION: No acute intra-abdominal or intrapelvic abnormality. Electronically Signed   By: Morgane  Naveau M.D.   On: 08/02/2023 21:56  Procedures Procedures    Medications Ordered in ED Medications  lactated ringers  bolus 1,000 mL (0 mLs Intravenous Stopped 08/02/23 2312)  ondansetron  (ZOFRAN ) injection 4 mg (4 mg Intravenous Given 08/02/23 2138)  morphine  (PF) 4 MG/ML injection 4 mg (4 mg Intravenous Given 08/02/23 2138)  iohexol  (OMNIPAQUE ) 300 MG/ML solution 100 mL (100 mLs Intravenous Contrast Given 08/02/23 2139)    ED Course/ Medical Decision Making/ A&P                                   43 yo male with history of laparoscopic cholecystectomy May 18, 2023 with Dr. Marny Sires, anxiety, bipolar, behcet's, hypertension, who presents with concern for abdominal pain.  DDx includes abscess, bile leak, bacterial or viral gastroenteritis, choledocolithiasis, cholangitis, pyelonephritis.    Labs completed and personally evaluated by me show no hepatitis, no pancreatitis, mildly elevated alk phos of 134 with a normal bilirubin, mild leukocytosis 10.7, urinalysis without infection.  CT abd  pelvis completed and evaluated by me and radiology and shows no sign of acute abnormalities. Doubt other occult bile leak given surgery in February and no abnormalities on CT>   Recommend follow up with PCP/outpt stool studies, surgery, consider GI follow up if symptoms continuing. Given rx for zofran , bentyl . Patient discharged in stable condition with understanding of reasons to return.          Final Clinical Impression(s) / ED Diagnoses Final diagnoses:  Generalized abdominal pain    Rx / DC Orders ED Discharge Orders          Ordered    ondansetron  (ZOFRAN -ODT) 4 MG disintegrating tablet  Every 8 hours PRN        08/02/23 2310    dicyclomine  (BENTYL ) 20 MG tablet  2 times daily        08/02/23 2310              Scarlette Currier, MD 08/03/23 1424

## 2023-08-02 NOTE — ED Triage Notes (Addendum)
 Patient reports RUQ pain and diarrhea for 2 weeks. Reports having gallbladder taken out in February and pain feels the same as it was before having surgery. Denies nausea/vomiting.

## 2023-08-10 NOTE — Progress Notes (Signed)
 [facility] Summary Report / Loop Recorder

## 2023-08-22 DIAGNOSIS — Z0289 Encounter for other administrative examinations: Secondary | ICD-10-CM

## 2023-09-03 ENCOUNTER — Ambulatory Visit: Payer: Self-pay | Admitting: Cardiovascular Disease

## 2023-09-03 ENCOUNTER — Ambulatory Visit (INDEPENDENT_AMBULATORY_CARE_PROVIDER_SITE_OTHER)

## 2023-09-03 DIAGNOSIS — R55 Syncope and collapse: Secondary | ICD-10-CM | POA: Diagnosis not present

## 2023-09-03 LAB — CUP PACEART REMOTE DEVICE CHECK
Date Time Interrogation Session: 20250601231354
Implantable Pulse Generator Implant Date: 20210512

## 2023-09-11 ENCOUNTER — Telehealth: Payer: Self-pay

## 2023-09-11 ENCOUNTER — Telehealth: Payer: Self-pay | Admitting: *Deleted

## 2023-09-11 NOTE — Telephone Encounter (Signed)
 Pt dmv form faxed on 09/11/2023

## 2023-09-11 NOTE — Telephone Encounter (Signed)
 DMV form completed and gave to [employer]. PCP to complete page 2

## 2023-09-13 NOTE — Progress Notes (Signed)
 [facility] Summary Report / Loop Recorder

## 2023-09-17 ENCOUNTER — Emergency Department (HOSPITAL_BASED_OUTPATIENT_CLINIC_OR_DEPARTMENT_OTHER)
Admission: EM | Admit: 2023-09-17 | Discharge: 2023-09-17 | Disposition: A | Discharge status: Home / Self Care | Attending: Emergency Medicine | Admitting: Emergency Medicine

## 2023-09-17 ENCOUNTER — Other Ambulatory Visit: Payer: Self-pay

## 2023-09-17 ENCOUNTER — Encounter (HOSPITAL_BASED_OUTPATIENT_CLINIC_OR_DEPARTMENT_OTHER): Payer: Self-pay

## 2023-09-17 DIAGNOSIS — I10 Essential (primary) hypertension: Secondary | ICD-10-CM | POA: Insufficient documentation

## 2023-09-17 DIAGNOSIS — Z79899 Other long term (current) drug therapy: Secondary | ICD-10-CM | POA: Diagnosis not present

## 2023-09-17 DIAGNOSIS — R1013 Epigastric pain: Secondary | ICD-10-CM | POA: Insufficient documentation

## 2023-09-17 LAB — COMPREHENSIVE METABOLIC PANEL WITH GFR
ALT: 30 U/L (ref 0–44)
AST: 20 U/L (ref 15–41)
Albumin: 4.5 g/dL (ref 3.5–5.0)
Alkaline Phosphatase: 116 U/L (ref 38–126)
Anion gap: 16 — ABNORMAL HIGH (ref 5–15)
BUN: 16 mg/dL (ref 6–20)
CO2: 22 mmol/L (ref 22–32)
Calcium: 9.9 mg/dL (ref 8.9–10.3)
Chloride: 102 mmol/L (ref 98–111)
Creatinine, Ser: 0.89 mg/dL (ref 0.61–1.24)
GFR, Estimated: >60 mL/min (ref >60)
Glucose, Bld: 94 mg/dL (ref 70–99)
Potassium: 3.7 mmol/L (ref 3.5–5.1)
Sodium: 139 mmol/L (ref 135–145)
Total Bilirubin: 0.2 mg/dL (ref 0.0–1.2)
Total Protein: 7.5 g/dL (ref 6.5–8.1)

## 2023-09-17 LAB — CBC
HCT: 41.4 % (ref 39.0–52.0)
Hemoglobin: 13.9 g/dL (ref 13.0–17.0)
MCH: 30.3 pg (ref 26.0–34.0)
MCHC: 33.6 g/dL (ref 30.0–36.0)
MCV: 90.4 fL (ref 80.0–100.0)
Platelets: 200 10*3/uL (ref 150–400)
RBC: 4.58 MIL/uL (ref 4.22–5.81)
RDW: 12 % (ref 11.5–15.5)
WBC: 9 10*3/uL (ref 4.0–10.5)
nRBC: 0 % (ref 0.0–0.2)

## 2023-09-17 LAB — URINALYSIS, ROUTINE W REFLEX MICROSCOPIC
Bilirubin Urine: NEGATIVE
Glucose, UA: NEGATIVE mg/dL
Hgb urine dipstick: NEGATIVE
Ketones, ur: NEGATIVE mg/dL
Leukocytes,Ua: NEGATIVE
Nitrite: NEGATIVE
Protein, ur: NEGATIVE mg/dL
Specific Gravity, Urine: >1.03 — ABNORMAL HIGH (ref 1.005–1.030)
pH: 6 (ref 5.0–8.0)

## 2023-09-17 LAB — LIPASE, BLOOD: Lipase: 31 U/L (ref 11–51)

## 2023-09-17 MED ORDER — SUCRALFATE 1 G PO TABS: 1.0000 g | ORAL_TABLET | Freq: Two times a day (BID) | ORAL | 0 refills | Status: DC

## 2023-09-17 MED ORDER — PANTOPRAZOLE SODIUM 20 MG PO TBEC: 40.0000 mg | DELAYED_RELEASE_TABLET | Freq: Every day | ORAL | 0 refills | Status: DC

## 2023-09-17 NOTE — Discharge Instructions (Addendum)
 Your kidney, liver, and.  Labs are normal today.  Your electrolytes were normal.  Your blood counts were normal.  Your urine shows that you are dehydrated.  Please keep well-hydrated at home with water.  No signs of infection in your urine.  As discussed, your pain could be due to ulcers.  You are being started on 2 medications, Protonix  and Carafate , to help with the pain and potential ulcers.  These medications are twice daily for the next 30 days.  I have also sent a GI referral for you.  They should be contacting you within the next 48 hours to schedule an appointment.  However, if you have not heard from them within the next 2 days, please reach out to one of the offices below to schedule an appointment for yourself.  Return to the ER for any worsening abdominal pain, vomiting, fevers, any other new or concerning symptoms

## 2023-09-17 NOTE — ED Provider Notes (Signed)
 [place] EMERGENCY DEPARTMENT AT [facility] Provider Note   CSN: [id number] Arrival date & time: 09/17/23  1734     Patient presents with: No chief complaint on file.   [name] [name] is a 43 y.o. male with history of Behcet's disease, hypertension, previous cholecystectomy and nisin fundoplication, presents with concern for epigastric abdominal pain for the past 2 weeks.  States that over the past 2 weeks, anything he eats will go right through him.  He states that his stools will look like the foods he has eaten, it does not look digested.  Denies any blood in his stools.  He also reports pain worsens when he eats.  Denies any fevers or vomiting.   HPI     Prior to Admission medications   Medication Sig Start Date End Date Taking? Authorizing Provider  pantoprazole  (PROTONIX ) 20 MG tablet Take 2 tablets (40 mg total) by mouth daily. 09/17/23 10/17/23 Yes Rexie Catena, PA-C  sucralfate  (CARAFATE ) 1 g tablet Take 1 tablet (1 g total) by mouth 2 (two) times daily. 09/17/23 10/17/23 Yes Rexie Catena, PA-C  Apremilast  30 MG TABS Take 30 mg by mouth in the morning and at bedtime.    [provider]  atorvastatin  (LIPITOR) 80 MG tablet TAKE 1 TABLET(80 MG) BY MOUTH DAILY 01/31/23   Croitoru, Mihai, MD  clonazePAM  (KLONOPIN ) 0.5 MG tablet Take 0.25 mg by mouth in the morning, at noon, and at bedtime.    [provider]  dicyclomine  (BENTYL ) 20 MG tablet Take 1 tablet (20 mg total) by mouth 2 (two) times daily. 08/02/23   Scarlette Currier, MD  FLUoxetine  (PROZAC ) 40 MG capsule Take 40 mg by mouth 2 (two) times daily.    [provider]  lamoTRIgine  200 MG TBDP DISSOLVE 1 TABLET BY MOUTH EVERY MORNING AND EVERY NIGHT AT BEDTIME 11/30/22   Camara, Amadou, MD  levETIRAcetam  (KEPPRA ) 750 MG tablet TAKE 1 TABLET(750 MG) BY MOUTH TWICE DAILY 10/31/22   Camara, Amadou, MD  melatonin 5 MG TABS Take 5 mg by mouth at bedtime.    [provider]   mycophenolate  (CELLCEPT ) 500 MG tablet Take 500 mg by mouth 2 (two) times daily. 03/20/17   [provider]  ondansetron  (ZOFRAN -ODT) 4 MG disintegrating tablet Take 1 tablet (4 mg total) by mouth every 8 (eight) hours as needed for nausea or vomiting. 08/02/23   Scarlette Currier, MD  QUEtiapine  (SEROQUEL ) 200 MG tablet Take 200 mg by mouth at bedtime.    [provider]  WEGOVY 0.5 MG/0.5ML SOAJ Inject 0.5 mg into the skin once a week. 03/31/23   [provider]    Allergies: Patient has no known allergies.    Review of Systems  Constitutional:  Negative for fever.  Gastrointestinal:  Positive for abdominal pain.    Updated Vital Signs BP 121/89 (BP Location: Right Arm)   Pulse 93   Temp 98.1 F (36.7 C)   Resp 16   SpO2 100%   Physical Exam Vitals and nursing note reviewed.  Constitutional:      General: He is not in acute distress.    Appearance: He is well-developed.  HENT:     Head: Normocephalic and atraumatic.   Eyes:     Conjunctiva/sclera: Conjunctivae normal.    Cardiovascular:     Rate and Rhythm: Normal rate and regular rhythm.     Heart sounds: No murmur heard. Pulmonary:     Effort: Pulmonary effort is normal. No respiratory distress.  Breath sounds: Normal breath sounds.  Abdominal:     Palpations: Abdomen is soft.     Tenderness: There is no abdominal tenderness.     Comments: Abdomen is soft and nontender, no rebound or guarding   Musculoskeletal:        General: No swelling.     Cervical back: Neck supple.   Skin:    General: Skin is warm and dry.     Capillary Refill: Capillary refill takes less than 2 seconds.   Neurological:     Mental Status: He is alert.   Psychiatric:        Mood and Affect: Mood normal.     (all labs ordered are listed, but only abnormal results are displayed) Labs Reviewed  COMPREHENSIVE METABOLIC PANEL WITH GFR - Abnormal; Notable for the following components:      Result Value    Anion gap 16 (*)    All other components within normal limits  URINALYSIS, ROUTINE W REFLEX MICROSCOPIC - Abnormal; Notable for the following components:   Color, Urine STRAW (*)    Specific Gravity, Urine >1.030 (*)    All other components within normal limits  LIPASE, BLOOD  CBC    EKG: None  Radiology: No results found.   Procedures   Medications Ordered in the ED - No data to display                                  Medical Decision Making Amount and/or Complexity of Data Reviewed Labs: ordered.     Differential diagnosis includes but is not limited to peptic ulcer, duodenal ulcer, gastritis, gastroenteritis, appendicitis, IBS, IBD, DKA, nephrolithiasis, UTI, pyelonephritis, pancreatitis, diverticulitis, mesenteric ischemia   ED Course:  Upon initial evaluation, patient is well-appearing, no acute distress.  Stable vitals.  Reporting epigastric abdominal pain for the past 2 weeks, particularly when eating.  Abdomen is soft nontender on exam.  CMP without any elevation in LFTs or creatinine.  Lipase within normal limits.  CBC without leukocytosis.  Urinalysis without signs of infection.  He is still having bowel movements, still passing gas, no concern for bowel obstruction.  Given no significant abdominal tenderness to palpation, unremarkable labs, stable vitals, I have low concern for acute intra-abdominal pathology at this time.  I had a shared decision-making conversation with the patient regarding obtaining CT abdomen pelvis imaging versus watchful waiting.  Patient feels comfortable with holding off of the CT today.  Discussed that since this pain worsens with eating, this could be a ulcer.  Will start him on Protonix  and Carafate  at home.  He declines a dose of this medication here today.  Patient is stable and appropriate for discharge home with close GI follow-up  Labs Ordered: I Ordered, and personally interpreted labs.  The pertinent results include:   CBC within  normal limits CMP without any elevation in LFTs or creatinine.  Normal electrolytes.  Anion gap 16 Lipase within normal limits Urinalysis with high specific gravity.  No signs of infection  Impression: Abdominal pain  Disposition:  The patient was discharged home with instructions to take Carafate  and Protonix  as prescribed.  GI referral sent for the patient, but contact information was also provided.  He understands he needs to call to schedule an appointment if he does not hear from their office within the next 48 hours. Return precautions given.    This chart was dictated using voice  recognition software, Nurse, children's. Despite the best efforts of this provider to proofread and correct errors, errors may still occur which can change documentation meaning.      Final diagnoses:  Epigastric pain    ED Discharge Orders          Ordered    sucralfate  (CARAFATE ) 1 g tablet  2 times daily        09/17/23 2210    pantoprazole  (PROTONIX ) 20 MG tablet  Daily        09/17/23 2210    Ambulatory referral to Gastroenterology        09/17/23 2211               Rexie Catena, PA-C 09/17/23 2231    Sallyanne Creamer, DO 09/18/23 [phone]

## 2023-09-17 NOTE — ED Triage Notes (Signed)
 Pt c/o abd pain, constipation x2wks, NV. Advises he is still passing gas, & I have disgusting burps. C/o LUQ abd pain right where my gallbladder was, but I had it removed.

## 2023-10-04 ENCOUNTER — Ambulatory Visit (INDEPENDENT_AMBULATORY_CARE_PROVIDER_SITE_OTHER)

## 2023-10-04 ENCOUNTER — Ambulatory Visit: Payer: Self-pay | Admitting: Cardiovascular Disease

## 2023-10-04 DIAGNOSIS — R55 Syncope and collapse: Secondary | ICD-10-CM | POA: Diagnosis not present

## 2023-10-04 LAB — CUP PACEART REMOTE DEVICE CHECK
Date Time Interrogation Session: 20250702230433
Implantable Pulse Generator Implant Date: 20210512

## 2023-10-16 ENCOUNTER — Encounter: Payer: Self-pay | Admitting: Neurology

## 2023-10-16 ENCOUNTER — Ambulatory Visit (INDEPENDENT_AMBULATORY_CARE_PROVIDER_SITE_OTHER): Payer: Medicare Other | Admitting: Neurology

## 2023-10-16 VITALS — BP 135/80 | HR 86 | Ht 72.0 in | Wt 216.2 lb

## 2023-10-16 DIAGNOSIS — R569 Unspecified convulsions: Secondary | ICD-10-CM

## 2023-10-16 DIAGNOSIS — Z5181 Encounter for therapeutic drug level monitoring: Secondary | ICD-10-CM | POA: Diagnosis not present

## 2023-10-16 MED ORDER — LAMOTRIGINE 200 MG PO TBDP: ORAL_TABLET | ORAL | 3 refills | Status: DC

## 2023-10-16 MED ORDER — LEVETIRACETAM 750 MG PO TABS: ORAL_TABLET | ORAL | 3 refills | Status: AC

## 2023-10-16 NOTE — Progress Notes (Signed)
 Reason for visit: Behcet's disease, seizures  [name] [name] is an 43 y.o. male  INTERVAL HISTORY 10/16/2023 [name] [name] presents today for follow-up, last visit was a year ago, since then, he has been doing well, denies any seizure or seizure like activity.  He is compliant with lamotrigine  and levetiracetam , no side effect.  He tells me a few months ago he did have gallbladder removal, due to gallstone and gallbladder infection.  Since then, he has been having occasional diarrhea.  He did follow-up with GI, was given medication to help with the diarrhea.  He tells me that he is set up for colonoscopy by the end of the year.  From a seizure perspective, he is doing well, again no seizure, no side effects and he reports compliance with the medications.  Interval history 10/10/2022:  Patient presents today for follow-up, he is alone.  Last visit was in November and since then he has been doing well, denies any seizure or seizure-like activity.  He is compliant with his lamotrigine  and Keppra  again no side effects.  Currently he is complaining of itching all over.  Mostly in his calves,  He reports that nothing helps he would like to see dermatology.   Interval history 02/13/2022:  [name] [name] presents today for follow-up.  Last visit was in July and since then he has not had any additional seizures.  His eyesight is completely back to normal (he was admitted for optic neuritis).  No other complaints.  Doing well overall.  Interval history 10/18/2021:  Patient presents today for follow-up, last visit was in November, at that time he was continued on Keppra  and lamotrigine .  He denies any additional seizures but stated 2 weeks ago he was admitted to the hospital for a bout of optic neuritis.  He completed steroid course and his vision is back to his normal self.  Denies any seizure while he was in the hospital. This is the first time he experienced optic neuritis.    Interval History 02/10/21:  This is a  43 year old gentleman previously followed by Dr. Jenel for seizure and Behcet's disease, doing well in term of his seizures, last last visit was on September 1, at that time he reported being seizure-free for more than 4 years.  He is currently on Keppra  750 mg twice daily and lamotrigine  200 mg twice daily.  He is presenting today needing a clearance for DMV in order to drive again, no other complaints.    History of present illness: Mr. Wentzell is a 43 year old right-handed white male with a history of Behcet's disease associated with seizures.  The patient has done well with seizure control on a combination of Keppra  and Lamictal .  He denies any side effects on the medication.  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  He was on Provigil  for severe fatigue after COVID infection but this has improved and he has stopped the medication.  He returns the office today for further evaluation.  Past Medical History:  Diagnosis Date   Anxiety    Behcet's disease (HCC)    Bipolar disorder (HCC)    Cardiac arrest (HCC) 12/25/2017   one round of CPR    Depression    Hypertension    Migraine    Seizures (HCC)    last one 2017   Sleep apnea    CPAP at night   Vitamin D  deficiency     Past Surgical History:  Procedure Laterality Date   APPENDECTOMY  CHOLECYSTECTOMY N/A 05/18/2023   Procedure: LAPAROSCOPIC CHOLECYSTECTOMY;  Surgeon: Lyndel Deward PARAS, MD;  Location: [facility];  Service: General;  Laterality: N/A;   CYSTOSCOPY WITH URETHRAL DILATATION N/A 10/15/2019   Procedure: CYSTOSCOPY WITH URETHRAL DILATATION WITH BALLOON;  Surgeon: Carolee Sherwood JONETTA DOUGLAS, MD;  Location: [facility];  Service: Urology;  Laterality: N/A;   FETAL SURGERY FOR CONGENITAL HERNIA     x 2   FRACTURE SURGERY Right    Arm   LEFT HEART CATH AND CORONARY ANGIOGRAPHY N/A 11/29/2018   Procedure: LEFT HEART CATH AND CORONARY ANGIOGRAPHY;  Surgeon: Swaziland, Peter M, MD;  Location: [facility];  Service: Cardiovascular;   Laterality: N/A;   maxilofacial     NISSEN FUNDOPLICATION     RADIOLOGY WITH ANESTHESIA N/A 02/13/2019   Procedure: MRI WITH ANESTHESIA OF BRAIN WITH AND WITHOUT CONTRAST;  Surgeon: Radiologist, Medication, MD;  Location: [facility];  Service: Radiology;  Laterality: N/A;   XI ROBOTIC ASSISTED VENTRAL HERNIA N/A 01/13/2022   Procedure: ROBOTIC RECURRENT VENTRAL HERNIA REPAIR WITH MESH;  Surgeon: Stechschulte, Deward PARAS, MD;  Location: [facility];  Service: General;  Laterality: N/A;    Family History  Problem Relation Age of Onset   Lung cancer Paternal Grandfather    Other Father        BPPV    Social history:  reports that he has never smoked. He has never used smokeless tobacco. He reports that he does not drink alcohol and does not use drugs.   No Known Allergies  Medications:  Current Meds  Medication Sig   atorvastatin  (LIPITOR) 80 MG tablet TAKE 1 TABLET(80 MG) BY MOUTH DAILY   clonazePAM  (KLONOPIN ) 0.5 MG tablet Take 0.25 mg by mouth in the morning, at noon, and at bedtime.   colesevelam (WELCHOL) 625 MG tablet Take 1,250 mg by mouth 2 (two) times daily.   FLUoxetine  (PROZAC ) 40 MG capsule Take 40 mg by mouth 2 (two) times daily.   melatonin 5 MG TABS Take 5 mg by mouth at bedtime.   Multiple Vitamins-Minerals (MULTIVITAMIN MEN PO) Take 1 tablet by mouth daily.   mycophenolate  (CELLCEPT ) 500 MG tablet Take 500 mg by mouth 2 (two) times daily.   QUEtiapine  (SEROQUEL ) 200 MG tablet Take 200 mg by mouth at bedtime.   WEGOVY 0.5 MG/0.5ML SOAJ Inject 1.75 mg into the skin once a week.   [DISCONTINUED] lamoTRIgine  200 MG TBDP DISSOLVE 1 TABLET BY MOUTH EVERY MORNING AND EVERY NIGHT AT BEDTIME   [DISCONTINUED] levETIRAcetam  (KEPPRA ) 750 MG tablet TAKE 1 TABLET(750 MG) BY MOUTH TWICE DAILY   Current Facility-Administered Medications for the 10/16/23 encounter (Office Visit) with Clayborne Divis, MD  Medication   lidocaine -EPINEPHrine  (XYLOCAINE  W/EPI) 1 %-1:100000 (with pres) injection 15 mL       ROS:  Out of a complete 14 system review of symptoms, the patient complains only of the following symptoms, and all other reviewed systems are negative.  Skin sores History of seizures  Blood pressure 135/80, pulse 86, height 6' (1.829 m), weight 216 lb 3.2 oz (98.1 kg), SpO2 98%.  Physical Exam  General: The patient is alert and cooperative at the time of the examination.  The patient is moderately obese.  Skin: No significant peripheral edema is noted.   Neurologic Exam  Mental status: The patient is alert and oriented x 3 at the time of the examination. The patient has apparent normal recent and remote memory, with an apparently normal attention span and concentration ability.  Cranial nerves: Facial symmetry is present. Speech is normal, no aphasia or dysarthria is noted. Extraocular movements are full. Visual fields are full.  Motor: The patient has good strength in all 4 extremities.  Sensory examination: Soft touch sensation is symmetric on the face, arms, and legs.  Coordination: The patient has good finger-nose-finger and heel-to-shin bilaterally.  Gait and station: The patient has a normal gait. Tandem gait is normal. Romberg is negative. No drift is seen.   Lamotrigine  level 5.9 Keppra  level 24.1   Assessment/Plan:  1.  Behcet's disease  2.  History of seizures  3. Optic neuritis, resolved  The patient is doing well with seizure control currently.  He will continue on the Lamictal  and Keppra . Continue to follow up with PCP regarding the itching complaints. Follow up in 1 year or sooner if worse.    I have spent a total of 30 minutes dedicated to this patient today, preparing to see patient, performing a medically appropriate examination and evaluation, ordering tests and/or medications and procedures, and counseling and educating the patient/family/caregiver; independently interpreting result and communicating results to the  family/patient/caregiver; and documenting clinical information in the electronic medical record.   Pastor Falling, MD 10/16/2023 2:08 PM  [facility] [address] [place], [place] [address] Phone [phone] Fax [phone]

## 2023-10-16 NOTE — Patient Instructions (Signed)
 Continue levetiracetam  750 mg twice daily Continue lamotrigine  200 mg twice daily, refills given Will check a lamotrigine  and levetiracetam  level today Continue your other medications Follow-up in 1 year or sooner if worse.

## 2023-10-18 ENCOUNTER — Ambulatory Visit: Payer: Self-pay | Admitting: Neurology

## 2023-10-18 LAB — LAMOTRIGINE LEVEL: Lamotrigine Lvl: 4.9 ug/mL (ref 2.0–20.0)

## 2023-10-18 LAB — LEVETIRACETAM LEVEL: Levetiracetam Lvl: 20.1 ug/mL (ref 10.0–40.0)

## 2023-10-22 NOTE — Progress Notes (Signed)
 [facility] Summary Report / Loop Recorder

## 2023-10-23 ENCOUNTER — Telehealth: Payer: Self-pay | Admitting: *Deleted

## 2023-10-23 NOTE — Telephone Encounter (Signed)
 Pt dmv form faxed on 10/23/2023

## 2023-11-05 ENCOUNTER — Ambulatory Visit (INDEPENDENT_AMBULATORY_CARE_PROVIDER_SITE_OTHER)

## 2023-11-05 DIAGNOSIS — R55 Syncope and collapse: Secondary | ICD-10-CM | POA: Diagnosis not present

## 2023-11-06 ENCOUNTER — Ambulatory Visit: Payer: Self-pay | Admitting: Cardiovascular Disease

## 2023-11-06 LAB — CUP PACEART REMOTE DEVICE CHECK
Date Time Interrogation Session: 20250802230242
Implantable Pulse Generator Implant Date: 20210512

## 2023-12-05 ENCOUNTER — Other Ambulatory Visit: Payer: Self-pay | Admitting: Neurology

## 2023-12-06 ENCOUNTER — Ambulatory Visit (INDEPENDENT_AMBULATORY_CARE_PROVIDER_SITE_OTHER)

## 2023-12-06 DIAGNOSIS — R55 Syncope and collapse: Secondary | ICD-10-CM | POA: Diagnosis not present

## 2023-12-06 LAB — CUP PACEART REMOTE DEVICE CHECK
Date Time Interrogation Session: 20250903230651
Implantable Pulse Generator Implant Date: 20210512

## 2023-12-07 ENCOUNTER — Ambulatory Visit: Payer: Self-pay | Admitting: Cardiovascular Disease

## 2023-12-15 NOTE — Progress Notes (Signed)
 Remote Loop Recorder Transmission

## 2023-12-31 NOTE — Progress Notes (Signed)
 Remote Loop Recorder Transmission

## 2024-01-07 ENCOUNTER — Encounter

## 2024-01-08 ENCOUNTER — Ambulatory Visit (INDEPENDENT_AMBULATORY_CARE_PROVIDER_SITE_OTHER)

## 2024-01-08 DIAGNOSIS — R55 Syncope and collapse: Secondary | ICD-10-CM

## 2024-01-09 LAB — CUP PACEART REMOTE DEVICE CHECK
Date Time Interrogation Session: 20251006230512
Implantable Pulse Generator Implant Date: 20210512

## 2024-01-10 ENCOUNTER — Ambulatory Visit: Payer: Self-pay | Admitting: Cardiovascular Disease

## 2024-01-10 NOTE — Progress Notes (Signed)
 Remote Loop Recorder Transmission

## 2024-01-11 NOTE — Progress Notes (Signed)
 Remote Loop Recorder Transmission

## 2024-01-30 ENCOUNTER — Other Ambulatory Visit: Payer: Self-pay

## 2024-01-31 MED ORDER — ATORVASTATIN CALCIUM 80 MG PO TABS: ORAL_TABLET | ORAL | 0 refills | Status: DC

## 2024-02-07 ENCOUNTER — Encounter

## 2024-02-08 ENCOUNTER — Ambulatory Visit (INDEPENDENT_AMBULATORY_CARE_PROVIDER_SITE_OTHER)

## 2024-02-08 DIAGNOSIS — R55 Syncope and collapse: Secondary | ICD-10-CM | POA: Diagnosis not present

## 2024-02-10 LAB — CUP PACEART REMOTE DEVICE CHECK
Date Time Interrogation Session: 20251106233404
Implantable Pulse Generator Implant Date: 20210512

## 2024-02-11 ENCOUNTER — Ambulatory Visit: Payer: Self-pay | Admitting: Cardiovascular Disease

## 2024-02-12 NOTE — Progress Notes (Signed)
 Remote Loop Recorder Transmission

## 2024-03-10 ENCOUNTER — Encounter

## 2024-03-10 ENCOUNTER — Ambulatory Visit

## 2024-03-11 LAB — CUP PACEART REMOTE DEVICE CHECK
Date Time Interrogation Session: 20251207230050
Implantable Pulse Generator Implant Date: 20210512

## 2024-03-12 ENCOUNTER — Ambulatory Visit: Payer: Self-pay | Admitting: Cardiovascular Disease

## 2024-03-18 NOTE — Progress Notes (Signed)
 Remote Loop Recorder Transmission

## 2024-03-28 ENCOUNTER — Emergency Department (HOSPITAL_BASED_OUTPATIENT_CLINIC_OR_DEPARTMENT_OTHER)
Admission: EM | Admit: 2024-03-28 | Discharge: 2024-03-28 | Disposition: A | Discharge status: Home / Self Care | Attending: Emergency Medicine | Admitting: Emergency Medicine

## 2024-03-28 ENCOUNTER — Other Ambulatory Visit: Payer: Self-pay

## 2024-03-28 ENCOUNTER — Encounter (HOSPITAL_BASED_OUTPATIENT_CLINIC_OR_DEPARTMENT_OTHER): Payer: Self-pay

## 2024-03-28 DIAGNOSIS — L03114 Cellulitis of left upper limb: Secondary | ICD-10-CM | POA: Insufficient documentation

## 2024-03-28 DIAGNOSIS — L02413 Cutaneous abscess of right upper limb: Secondary | ICD-10-CM | POA: Diagnosis present

## 2024-03-28 DIAGNOSIS — L0291 Cutaneous abscess, unspecified: Secondary | ICD-10-CM

## 2024-03-28 LAB — CBC WITH DIFFERENTIAL/PLATELET
Abs Immature Granulocytes: 0.04 K/uL (ref 0.00–0.07)
Basophils Absolute: 0 K/uL (ref 0.0–0.1)
Basophils Relative: 0 %
Eosinophils Absolute: 0.2 K/uL (ref 0.0–0.5)
Eosinophils Relative: 2 %
HCT: 44.3 % (ref 39.0–52.0)
Hemoglobin: 14.9 g/dL (ref 13.0–17.0)
Immature Granulocytes: 1 %
Lymphocytes Relative: 17 %
Lymphs Abs: 1.5 K/uL (ref 0.7–4.0)
MCH: 31.5 pg (ref 26.0–34.0)
MCHC: 33.6 g/dL (ref 30.0–36.0)
MCV: 93.7 fL (ref 80.0–100.0)
Monocytes Absolute: 0.6 K/uL (ref 0.1–1.0)
Monocytes Relative: 7 %
Neutro Abs: 6.4 K/uL (ref 1.7–7.7)
Neutrophils Relative %: 73 %
Platelets: 211 K/uL (ref 150–400)
RBC: 4.73 MIL/uL (ref 4.22–5.81)
RDW: 11.9 % (ref 11.5–15.5)
WBC: 8.8 K/uL (ref 4.0–10.5)
nRBC: 0 % (ref 0.0–0.2)

## 2024-03-28 LAB — BASIC METABOLIC PANEL WITH GFR
Anion gap: 13 (ref 5–15)
BUN: 11 mg/dL (ref 6–20)
CO2: 25 mmol/L (ref 22–32)
Calcium: 10.1 mg/dL (ref 8.9–10.3)
Chloride: 102 mmol/L (ref 98–111)
Creatinine, Ser: 0.89 mg/dL (ref 0.61–1.24)
GFR, Estimated: >60 mL/min
Glucose, Bld: 92 mg/dL (ref 70–99)
Potassium: 3.8 mmol/L (ref 3.5–5.1)
Sodium: 140 mmol/L (ref 135–145)

## 2024-03-28 MED ORDER — DOXYCYCLINE HYCLATE 100 MG PO CAPS: 100.0000 mg | ORAL_CAPSULE | Freq: Two times a day (BID) | ORAL | 0 refills | Status: AC

## 2024-03-28 MED ORDER — OXYCODONE-ACETAMINOPHEN 5-325 MG PO TABS: 1.0000 | ORAL_TABLET | Freq: Four times a day (QID) | ORAL | 0 refills | Status: AC | PRN

## 2024-03-28 MED ORDER — DOXYCYCLINE HYCLATE 100 MG PO TABS
100.0000 mg | ORAL_TABLET | Freq: Once | ORAL | Status: AC
  Administered 2024-03-28: 100 mg via ORAL
  Filled 2024-03-28: qty 1

## 2024-03-28 MED ORDER — OXYCODONE-ACETAMINOPHEN 5-325 MG PO TABS
1.0000 | ORAL_TABLET | Freq: Once | ORAL | Status: AC
  Administered 2024-03-28: 1 via ORAL
  Filled 2024-03-28: qty 1

## 2024-03-28 NOTE — ED Provider Notes (Signed)
 " [place] EMERGENCY DEPARTMENT AT [facility] Provider Note   CSN: [id number] Arrival date & time: 03/28/24  1547     Patient presents with: Leg Pain and Wound Check   [name] [name] is a 43 y.o. male.   Patient is here for evaluation of abscess on the upper left posterior thigh.  Patient has a history of Bechet's disease.  Abscess first appeared a few days ago and has been getting progressively larger and more tender.  It has been draining blood and pus.  He denies fevers, N/V/D, shortness of breath, or chest pain.  The history is provided by the patient.  Leg Pain Location:  Leg Leg location:  L upper leg Wound Check       Prior to Admission medications  Medication Sig Start Date End Date Taking? Authorizing Provider  doxycycline  (VIBRAMYCIN ) 100 MG capsule Take 1 capsule (100 mg total) by mouth 2 (two) times daily for 7 days. 03/28/24 04/04/24 Yes Rosina Almarie LABOR, PA-C  oxyCODONE -acetaminophen  (PERCOCET/ROXICET) 5-325 MG tablet Take 1 tablet by mouth every 6 (six) hours as needed for up to 3 days for severe pain (pain score 7-10). 03/28/24 03/31/24 Yes Rosina Almarie LABOR, PA-C  atorvastatin  (LIPITOR) 80 MG tablet TAKE 1 TABLET(80 MG) BY MOUTH DAILY 01/31/24   Croitoru, Mihai, MD  clonazePAM  (KLONOPIN ) 0.5 MG tablet Take 0.25 mg by mouth in the morning, at noon, and at bedtime.    [provider]  colesevelam (WELCHOL) 625 MG tablet Take 1,250 mg by mouth 2 (two) times daily.    [provider]  colestipol (COLESTID) 1 g tablet Take by mouth.    [provider]  FLUoxetine  (PROZAC ) 40 MG capsule Take 40 mg by mouth 2 (two) times daily.    [provider]  lamoTRIgine  200 MG TBDP DISSOLVE 1 TABLET BY MOUTH EVERY MORNING AND EVERY NIGHT AT BEDTIME 12/05/23   Gregg Lek, MD  levETIRAcetam  (KEPPRA ) 750 MG tablet TAKE 1 TABLET(750 MG) BY MOUTH TWICE DAILY 10/16/23   Camara, Amadou, MD  melatonin 5 MG TABS Take 5 mg by mouth at  bedtime.    [provider]  mycophenolate  (CELLCEPT ) 500 MG tablet Take 500 mg by mouth 2 (two) times daily. 03/20/17   [provider]  QUEtiapine  (SEROQUEL ) 200 MG tablet Take 200 mg by mouth at bedtime.    [provider]  WEGOVY 0.5 MG/0.5ML SOAJ Inject 1.75 mg into the skin once a week. 03/31/23   [provider]    Allergies: Patient has no known allergies.    Review of Systems  Skin:  Positive for color change and wound.    Updated Vital Signs BP 130/85   Pulse 91   Temp 98.2 F (36.8 C) (Oral)   Resp 16   Ht 6' (1.829 m)   Wt 89.8 kg   SpO2 100%   BMI 26.85 kg/m   Physical Exam Vitals and nursing note reviewed.  Constitutional:      General: He is not in acute distress.    Appearance: Normal appearance. He is not ill-appearing, toxic-appearing or diaphoretic.  HENT:     Head: Normocephalic and atraumatic.     Nose: Nose normal.  Eyes:     General: No scleral icterus.    Extraocular Movements: Extraocular movements intact.     Conjunctiva/sclera: Conjunctivae normal.  Pulmonary:     Effort: Pulmonary effort is normal. No respiratory distress.  Musculoskeletal:        General:  Normal range of motion.     Cervical back: Normal range of motion.  Skin:    General: Skin is warm and dry.     Coloration: Skin is not jaundiced or pale.     Findings: Erythema present.     Comments: 4 cm circular area of erythema and tenderness to the left upper posterior thigh. Within this area of erythema is a small raised area that is minimally fluctuant and actively draining blood and purulence.  Neurological:     Mental Status: He is alert and oriented to person, place, and time.     (all labs ordered are listed, but only abnormal results are displayed) Labs Reviewed  CBC WITH DIFFERENTIAL/PLATELET  BASIC METABOLIC PANEL WITH GFR    EKG: None  Radiology: No results found.   Procedures   Medications Ordered in the ED   oxyCODONE -acetaminophen  (PERCOCET/ROXICET) 5-325 MG per tablet 1 tablet (1 tablet Oral Given 03/28/24 2225)  doxycycline  (VIBRA -TABS) tablet 100 mg (100 mg Oral Given 03/28/24 2236)    Patient presents to the ED for concern of left upper thigh abscess, this involves an extensive number of treatment options, and is a complaint that carries with it a high risk of complications and morbidity.  The differential diagnosis includes abscess, cellulitis, sepsis, cyst.  Vitals and blood work are not indicate of sepsis.  Co morbidities that complicate the patient evaluation  Behcet's disease   Lab Tests:  I Ordered, and personally interpreted labs.  The pertinent results include:  Blood work not indicative of systemic infection.   Imaging Studies ordered:  Ultrasound performed at bedside showing cellulitis and very small amount of fluid within abscess.   Medicines ordered and prescription drug management:  I ordered medication including percocet and doxycyline  for pain and abscess/cellulitis.  Reevaluation of the patient after these medicines showed that the patient improved I have reviewed the patients home medicines and have made adjustments as needed   Problem List / ED Course:     Abscess with associated cellulitis. Blood work is not indicate of systemic infection. Bedside ultrasound showing minimal fluid filled sac so I&D not performed. Pain managed and antibiotics given. Return precautions reviewed and patient verbalized understanding. Stable for discharge.   Reevaluation:  After the interventions noted above, I reevaluated the patient and found that they have :improved   Dispostion:  After consideration of the diagnostic results and the patients response to treatment, I feel that the patent would benefit from supportive care in the home setting with antibiotics and short-course of pain medication. Close follow up with primary care for ongoing wound check. Return  precautions given.    Medical Decision Making Amount and/or Complexity of Data Reviewed Labs: ordered.  Risk Prescription drug management.   This note was produced using Electronics Engineer. While the provider has reviewed and verified all clinical information, transcription errors may remain.    Final diagnoses:  Abscess  Cellulitis of left upper extremity    ED Discharge Orders          Ordered    doxycycline  (VIBRAMYCIN ) 100 MG capsule  2 times daily        03/28/24 2230    oxyCODONE -acetaminophen  (PERCOCET/ROXICET) 5-325 MG tablet  Every 6 hours PRN        03/28/24 2231               Rosina Almarie LABOR, PA-C 03/29/24 0225  "

## 2024-03-28 NOTE — ED Triage Notes (Signed)
 Pt reports abscess to L upper leg for several days. Pt reports it has gotten worse and is bleeding. X1 set blood cultures drawn in triage.

## 2024-03-28 NOTE — Discharge Instructions (Addendum)
 It was a pleasure meeting with you today.  I have sent antibiotics and short-course of pain medication to your pharmacy. Follow-up with PCP within the next week for wound recheck. Please return if pain or swelling worsens, you develop fever, or any other new or concerning symptoms develop.

## 2024-04-10 ENCOUNTER — Ambulatory Visit

## 2024-04-10 ENCOUNTER — Encounter

## 2024-04-10 DIAGNOSIS — R55 Syncope and collapse: Secondary | ICD-10-CM | POA: Diagnosis not present

## 2024-04-11 LAB — CUP PACEART REMOTE DEVICE CHECK
Date Time Interrogation Session: 20260107230355
Implantable Pulse Generator Implant Date: 20210512

## 2024-04-11 NOTE — Progress Notes (Signed)
 Remote Loop Recorder Transmission

## 2024-04-15 ENCOUNTER — Ambulatory Visit: Payer: Self-pay | Admitting: Cardiovascular Disease

## 2024-05-05 ENCOUNTER — Other Ambulatory Visit: Payer: Self-pay | Admitting: Cardiovascular Disease

## 2024-05-06 ENCOUNTER — Other Ambulatory Visit: Payer: Self-pay | Admitting: Cardiology

## 2024-05-08 ENCOUNTER — Other Ambulatory Visit: Payer: Self-pay | Admitting: Cardiology

## 2024-05-11 ENCOUNTER — Encounter

## 2024-05-12 ENCOUNTER — Encounter

## 2024-06-09 ENCOUNTER — Ambulatory Visit: Admitting: Neurology

## 2024-06-11 ENCOUNTER — Ambulatory Visit

## 2024-07-12 ENCOUNTER — Ambulatory Visit

## 2024-08-12 ENCOUNTER — Ambulatory Visit

## 2024-09-12 ENCOUNTER — Ambulatory Visit

## 2024-10-13 ENCOUNTER — Ambulatory Visit

## 2024-11-13 ENCOUNTER — Ambulatory Visit

## 2024-12-14 ENCOUNTER — Ambulatory Visit

## 2025-01-14 ENCOUNTER — Ambulatory Visit

## 2025-02-14 ENCOUNTER — Ambulatory Visit

## 2025-03-17 ENCOUNTER — Ambulatory Visit

## 2025-04-17 ENCOUNTER — Ambulatory Visit
